# Patient Record
Sex: Female | Born: 1935 | Race: White | Hispanic: No | Smoking: Never smoker
Health system: Southern US, Community
[De-identification: ages and names within clinical notes are randomized; demographics above are authoritative.]

## PROBLEM LIST (undated history)

## (undated) DIAGNOSIS — I471 Supraventricular tachycardia, unspecified: Secondary | ICD-10-CM

## (undated) DIAGNOSIS — I10 Essential (primary) hypertension: Secondary | ICD-10-CM

## (undated) DIAGNOSIS — M549 Dorsalgia, unspecified: Secondary | ICD-10-CM

## (undated) DIAGNOSIS — I639 Cerebral infarction, unspecified: Secondary | ICD-10-CM

## (undated) DIAGNOSIS — R351 Nocturia: Secondary | ICD-10-CM

## (undated) DIAGNOSIS — I480 Paroxysmal atrial fibrillation: Secondary | ICD-10-CM

## (undated) DIAGNOSIS — M899 Disorder of bone, unspecified: Secondary | ICD-10-CM

## (undated) DIAGNOSIS — K219 Gastro-esophageal reflux disease without esophagitis: Secondary | ICD-10-CM

## (undated) DIAGNOSIS — N182 Chronic kidney disease, stage 2 (mild): Secondary | ICD-10-CM

## (undated) DIAGNOSIS — I4892 Unspecified atrial flutter: Secondary | ICD-10-CM

## (undated) DIAGNOSIS — F411 Generalized anxiety disorder: Secondary | ICD-10-CM

## (undated) DIAGNOSIS — R7302 Impaired glucose tolerance (oral): Secondary | ICD-10-CM

## (undated) DIAGNOSIS — H548 Legal blindness, as defined in USA: Secondary | ICD-10-CM

## (undated) DIAGNOSIS — R269 Unspecified abnormalities of gait and mobility: Secondary | ICD-10-CM

## (undated) DIAGNOSIS — M545 Low back pain: Secondary | ICD-10-CM

## (undated) DIAGNOSIS — G4710 Hypersomnia, unspecified: Secondary | ICD-10-CM

## (undated) DIAGNOSIS — E669 Obesity, unspecified: Secondary | ICD-10-CM

## (undated) DIAGNOSIS — E785 Hyperlipidemia, unspecified: Secondary | ICD-10-CM

## (undated) DIAGNOSIS — Z853 Personal history of malignant neoplasm of breast: Secondary | ICD-10-CM

## (undated) DIAGNOSIS — M949 Disorder of cartilage, unspecified: Secondary | ICD-10-CM

## (undated) HISTORY — DX: Unspecified abnormalities of gait and mobility: R26.9

## (undated) HISTORY — DX: Essential (primary) hypertension: I10

## (undated) HISTORY — DX: Legal blindness, as defined in USA: H54.8

## (undated) HISTORY — DX: Nocturia: R35.1

## (undated) HISTORY — DX: Disorder of bone, unspecified: M89.9

## (undated) HISTORY — DX: Low back pain: M54.5

## (undated) HISTORY — DX: Paroxysmal atrial fibrillation: I48.0

## (undated) HISTORY — DX: Unspecified atrial flutter: I48.92

## (undated) HISTORY — DX: Generalized anxiety disorder: F41.1

## (undated) HISTORY — PX: KNEE SURGERY: SHX244

## (undated) HISTORY — DX: Obesity, unspecified: E66.9

## (undated) HISTORY — PX: APPENDECTOMY: SHX54

## (undated) HISTORY — PX: CATARACT EXTRACTION: SUR2

## (undated) HISTORY — PX: DILATION AND CURETTAGE OF UTERUS: SHX78

## (undated) HISTORY — DX: Impaired glucose tolerance (oral): R73.02

## (undated) HISTORY — DX: Supraventricular tachycardia, unspecified: I47.10

## (undated) HISTORY — DX: Supraventricular tachycardia: I47.1

## (undated) HISTORY — PX: OTHER SURGICAL HISTORY: SHX169

## (undated) HISTORY — DX: Gastro-esophageal reflux disease without esophagitis: K21.9

## (undated) HISTORY — DX: Dorsalgia, unspecified: M54.9

## (undated) HISTORY — DX: Disorder of cartilage, unspecified: M94.9

## (undated) HISTORY — DX: Cerebral infarction, unspecified: I63.9

## (undated) HISTORY — DX: Hypersomnia, unspecified: G47.10

## (undated) HISTORY — DX: Chronic kidney disease, stage 2 (mild): N18.2

## (undated) HISTORY — DX: Personal history of malignant neoplasm of breast: Z85.3

## (undated) HISTORY — DX: Hyperlipidemia, unspecified: E78.5

---

## 2001-06-11 ENCOUNTER — Emergency Department (HOSPITAL_COMMUNITY): Admission: EM | Admit: 2001-06-11 | Discharge: 2001-06-11 | Payer: Self-pay | Admitting: *Deleted

## 2001-06-30 ENCOUNTER — Inpatient Hospital Stay (HOSPITAL_COMMUNITY): Admission: EM | Admit: 2001-06-30 | Discharge: 2001-07-06 | Payer: Self-pay | Admitting: Internal Medicine

## 2001-06-30 ENCOUNTER — Encounter (INDEPENDENT_AMBULATORY_CARE_PROVIDER_SITE_OTHER): Payer: Self-pay

## 2001-06-30 ENCOUNTER — Encounter: Payer: Self-pay | Admitting: Internal Medicine

## 2001-07-03 ENCOUNTER — Encounter: Payer: Self-pay | Admitting: Anesthesiology

## 2001-08-21 ENCOUNTER — Encounter (INDEPENDENT_AMBULATORY_CARE_PROVIDER_SITE_OTHER): Payer: Self-pay

## 2001-08-21 ENCOUNTER — Ambulatory Visit (HOSPITAL_COMMUNITY): Admission: RE | Admit: 2001-08-21 | Discharge: 2001-08-21 | Payer: Self-pay | Admitting: Obstetrics and Gynecology

## 2002-11-29 ENCOUNTER — Emergency Department (HOSPITAL_COMMUNITY): Admission: EM | Admit: 2002-11-29 | Discharge: 2002-11-30 | Payer: Self-pay | Admitting: Emergency Medicine

## 2003-03-30 ENCOUNTER — Emergency Department (HOSPITAL_COMMUNITY): Admission: EM | Admit: 2003-03-30 | Discharge: 2003-03-30 | Payer: Self-pay

## 2004-07-17 ENCOUNTER — Ambulatory Visit: Payer: Self-pay | Admitting: Internal Medicine

## 2004-07-31 ENCOUNTER — Encounter: Admission: RE | Admit: 2004-07-31 | Discharge: 2004-07-31 | Payer: Self-pay | Admitting: Internal Medicine

## 2004-07-31 ENCOUNTER — Encounter (INDEPENDENT_AMBULATORY_CARE_PROVIDER_SITE_OTHER): Payer: Self-pay | Admitting: Specialist

## 2004-08-03 ENCOUNTER — Encounter: Admission: RE | Admit: 2004-08-03 | Discharge: 2004-08-03 | Payer: Self-pay | Admitting: Internal Medicine

## 2004-08-08 ENCOUNTER — Ambulatory Visit: Payer: Self-pay | Admitting: Internal Medicine

## 2004-08-18 ENCOUNTER — Encounter: Admission: RE | Admit: 2004-08-18 | Discharge: 2004-08-18 | Payer: Self-pay

## 2004-08-23 ENCOUNTER — Encounter: Admission: RE | Admit: 2004-08-23 | Discharge: 2004-08-23 | Payer: Self-pay

## 2004-08-23 ENCOUNTER — Encounter (INDEPENDENT_AMBULATORY_CARE_PROVIDER_SITE_OTHER): Payer: Self-pay | Admitting: Diagnostic Radiology

## 2004-08-23 ENCOUNTER — Encounter (INDEPENDENT_AMBULATORY_CARE_PROVIDER_SITE_OTHER): Payer: Self-pay | Admitting: Specialist

## 2004-08-29 ENCOUNTER — Ambulatory Visit: Payer: Self-pay | Admitting: Oncology

## 2004-09-01 ENCOUNTER — Ambulatory Visit: Admission: RE | Admit: 2004-09-01 | Discharge: 2004-10-17 | Payer: Self-pay | Admitting: Radiation Oncology

## 2004-09-22 ENCOUNTER — Ambulatory Visit (HOSPITAL_COMMUNITY): Admission: RE | Admit: 2004-09-22 | Discharge: 2004-09-22 | Payer: Self-pay | Admitting: Oncology

## 2004-11-15 ENCOUNTER — Encounter (INDEPENDENT_AMBULATORY_CARE_PROVIDER_SITE_OTHER): Payer: Self-pay | Admitting: *Deleted

## 2004-11-15 ENCOUNTER — Inpatient Hospital Stay (HOSPITAL_COMMUNITY): Admission: RE | Admit: 2004-11-15 | Discharge: 2004-11-17 | Payer: Self-pay

## 2004-11-26 ENCOUNTER — Inpatient Hospital Stay (HOSPITAL_COMMUNITY): Admission: EM | Admit: 2004-11-26 | Discharge: 2004-12-01 | Payer: Self-pay | Admitting: Emergency Medicine

## 2005-12-18 ENCOUNTER — Ambulatory Visit: Payer: Self-pay | Admitting: Internal Medicine

## 2005-12-18 LAB — CONVERTED CEMR LAB
Basophils Absolute: 0 10*3/uL (ref 0.0–0.1)
Basophils Relative: 0.5 % (ref 0.0–1.0)
Chloride: 106 meq/L (ref 96–112)
Chol/HDL Ratio, serum: 4.4
Cholesterol: 184 mg/dL (ref 0–200)
Eosinophil percent: 3.8 % (ref 0.0–5.0)
HDL: 41.4 mg/dL (ref >39.0)
Hemoglobin: 14.9 g/dL (ref 12.0–15.0)
LDL Cholesterol: 116 mg/dL — ABNORMAL HIGH (ref 0–99)
Monocytes Relative: 6.1 % (ref 3.0–11.0)
Platelets: 288 10*3/uL (ref 150–400)
RDW: 12.1 % (ref 11.5–14.6)
TSH: 3.31 microintl units/mL (ref 0.35–5.50)
WBC: 9.4 10*3/uL (ref 4.5–10.5)

## 2006-01-09 ENCOUNTER — Ambulatory Visit: Payer: Self-pay | Admitting: Internal Medicine

## 2006-02-28 ENCOUNTER — Emergency Department (HOSPITAL_COMMUNITY): Admission: EM | Admit: 2006-02-28 | Discharge: 2006-02-28 | Payer: Self-pay | Admitting: Emergency Medicine

## 2006-03-19 ENCOUNTER — Emergency Department (HOSPITAL_COMMUNITY): Admission: EM | Admit: 2006-03-19 | Discharge: 2006-03-19 | Payer: Self-pay | Admitting: Emergency Medicine

## 2006-03-20 ENCOUNTER — Ambulatory Visit: Payer: Self-pay | Admitting: Oncology

## 2006-04-11 LAB — CBC WITH DIFFERENTIAL/PLATELET
BASO%: 0.6 % (ref 0.0–2.0)
Basophils Absolute: 0 10*3/uL (ref 0.0–0.1)
EOS%: 5.1 % (ref 0.0–7.0)
HGB: 16 g/dL — ABNORMAL HIGH (ref 11.6–15.9)
MCH: 31.7 pg (ref 26.0–34.0)
MCHC: 35.3 g/dL (ref 32.0–36.0)
RDW: 12.6 % (ref 11.3–14.5)
WBC: 8.4 10*3/uL (ref 3.9–10.0)
lymph#: 2.8 10*3/uL (ref 0.9–3.3)

## 2006-04-11 LAB — COMPREHENSIVE METABOLIC PANEL
ALT: 38 U/L — ABNORMAL HIGH (ref 0–35)
AST: 29 U/L (ref 0–37)
Albumin: 4.1 g/dL (ref 3.5–5.2)
Calcium: 9.1 mg/dL (ref 8.4–10.5)
Chloride: 99 mEq/L (ref 96–112)
Potassium: 3.3 mEq/L — ABNORMAL LOW (ref 3.5–5.3)
Total Protein: 7.6 g/dL (ref 6.0–8.3)

## 2006-04-11 LAB — CANCER ANTIGEN 27.29: CA 27.29: 58 U/mL — ABNORMAL HIGH (ref 0–39)

## 2006-04-30 ENCOUNTER — Ambulatory Visit (HOSPITAL_COMMUNITY): Admission: RE | Admit: 2006-04-30 | Discharge: 2006-04-30 | Payer: Self-pay | Admitting: Oncology

## 2006-05-13 ENCOUNTER — Ambulatory Visit: Payer: Self-pay | Admitting: Oncology

## 2006-05-16 LAB — COMPREHENSIVE METABOLIC PANEL
ALT: 38 U/L — ABNORMAL HIGH (ref 0–35)
AST: 36 U/L (ref 0–37)
Alkaline Phosphatase: 79 U/L (ref 39–117)
BUN: 17 mg/dL (ref 6–23)
Creatinine, Ser: 1.02 mg/dL (ref 0.40–1.20)
Potassium: 3.3 mEq/L — ABNORMAL LOW (ref 3.5–5.3)

## 2006-05-16 LAB — CBC WITH DIFFERENTIAL/PLATELET
BASO%: 0.6 % (ref 0.0–2.0)
Basophils Absolute: 0.1 10*3/uL (ref 0.0–0.1)
EOS%: 4.6 % (ref 0.0–7.0)
HGB: 15.7 g/dL (ref 11.6–15.9)
MCH: 31.1 pg (ref 26.0–34.0)
MCHC: 34.9 g/dL (ref 32.0–36.0)
MCV: 89.2 fL (ref 81.0–101.0)
MONO%: 6 % (ref 0.0–13.0)
NEUT%: 60.6 % (ref 39.6–76.8)
RDW: 12.5 % (ref 11.3–14.5)

## 2006-06-25 ENCOUNTER — Ambulatory Visit (HOSPITAL_COMMUNITY): Admission: RE | Admit: 2006-06-25 | Discharge: 2006-06-25 | Payer: Self-pay | Admitting: Oncology

## 2006-06-28 ENCOUNTER — Ambulatory Visit: Payer: Self-pay | Admitting: Oncology

## 2006-07-02 LAB — CBC WITH DIFFERENTIAL/PLATELET
Basophils Absolute: 0 10*3/uL (ref 0.0–0.1)
Eosinophils Absolute: 0.3 10*3/uL (ref 0.0–0.5)
HGB: 14.6 g/dL (ref 11.6–15.9)
LYMPH%: 31.2 % (ref 14.0–48.0)
MCV: 88.3 fL (ref 81.0–101.0)
MONO%: 6.3 % (ref 0.0–13.0)
NEUT#: 3.9 10*3/uL (ref 1.5–6.5)
NEUT%: 57.1 % (ref 39.6–76.8)
Platelets: 310 10*3/uL (ref 145–400)

## 2006-07-02 LAB — COMPREHENSIVE METABOLIC PANEL
ALT: 51 U/L — ABNORMAL HIGH (ref 0–35)
Albumin: 4.2 g/dL (ref 3.5–5.2)
CO2: 26 mEq/L (ref 19–32)
Calcium: 9.6 mg/dL (ref 8.4–10.5)
Chloride: 101 mEq/L (ref 96–112)
Glucose, Bld: 142 mg/dL — ABNORMAL HIGH (ref 70–99)
Potassium: 3.2 mEq/L — ABNORMAL LOW (ref 3.5–5.3)
Sodium: 141 mEq/L (ref 135–145)
Total Bilirubin: 1 mg/dL (ref 0.3–1.2)
Total Protein: 7.4 g/dL (ref 6.0–8.3)

## 2006-07-02 LAB — CANCER ANTIGEN 27.29: CA 27.29: 37 U/mL (ref 0–39)

## 2006-07-11 LAB — CBC WITH DIFFERENTIAL/PLATELET
BASO%: 0.5 % (ref 0.0–2.0)
EOS%: 5.1 % (ref 0.0–7.0)
MCH: 31.4 pg (ref 26.0–34.0)
MCHC: 36.2 g/dL — ABNORMAL HIGH (ref 32.0–36.0)
MONO#: 0.4 10*3/uL (ref 0.1–0.9)
NEUT%: 59.7 % (ref 39.6–76.8)
RBC: 4.6 10*6/uL (ref 3.70–5.32)
WBC: 7.2 10*3/uL (ref 3.9–10.0)
lymph#: 2.1 10*3/uL (ref 0.9–3.3)

## 2006-07-11 LAB — COMPREHENSIVE METABOLIC PANEL
ALT: 36 U/L — ABNORMAL HIGH (ref 0–35)
AST: 31 U/L (ref 0–37)
CO2: 26 mEq/L (ref 19–32)
Calcium: 9.3 mg/dL (ref 8.4–10.5)
Chloride: 104 mEq/L (ref 96–112)
Creatinine, Ser: 0.99 mg/dL (ref 0.40–1.20)
Sodium: 143 mEq/L (ref 135–145)
Total Bilirubin: 1.1 mg/dL (ref 0.3–1.2)
Total Protein: 7.4 g/dL (ref 6.0–8.3)

## 2006-07-11 LAB — CANCER ANTIGEN 27.29: CA 27.29: 42 U/mL — ABNORMAL HIGH (ref 0–39)

## 2006-08-06 LAB — BASIC METABOLIC PANEL
BUN: 21 mg/dL (ref 6–23)
CO2: 23 mEq/L (ref 19–32)
Calcium: 9 mg/dL (ref 8.4–10.5)
Chloride: 100 mEq/L (ref 96–112)
Creatinine, Ser: 1.05 mg/dL (ref 0.40–1.20)

## 2006-08-30 ENCOUNTER — Ambulatory Visit: Payer: Self-pay | Admitting: Oncology

## 2006-09-03 LAB — BASIC METABOLIC PANEL
BUN: 11 mg/dL (ref 6–23)
Calcium: 9.3 mg/dL (ref 8.4–10.5)
Chloride: 105 mEq/L (ref 96–112)
Creatinine, Ser: 1.02 mg/dL (ref 0.40–1.20)

## 2006-10-03 ENCOUNTER — Encounter: Payer: Self-pay | Admitting: Internal Medicine

## 2006-10-03 LAB — COMPREHENSIVE METABOLIC PANEL
Albumin: 4.2 g/dL (ref 3.5–5.2)
Alkaline Phosphatase: 60 U/L (ref 39–117)
BUN: 16 mg/dL (ref 6–23)
Calcium: 9.1 mg/dL (ref 8.4–10.5)
Chloride: 99 mEq/L (ref 96–112)
Glucose, Bld: 122 mg/dL — ABNORMAL HIGH (ref 70–99)
Potassium: 3 mEq/L — ABNORMAL LOW (ref 3.5–5.3)
Sodium: 138 mEq/L (ref 135–145)
Total Protein: 7.6 g/dL (ref 6.0–8.3)

## 2006-10-03 LAB — CBC WITH DIFFERENTIAL/PLATELET
Basophils Absolute: 0 10*3/uL (ref 0.0–0.1)
Eosinophils Absolute: 0.5 10*3/uL (ref 0.0–0.5)
HGB: 14.9 g/dL (ref 11.6–15.9)
MONO#: 0.6 10*3/uL (ref 0.1–0.9)
MONO%: 7.8 % (ref 0.0–13.0)
NEUT#: 3.6 10*3/uL (ref 1.5–6.5)
RBC: 4.94 10*6/uL (ref 3.70–5.32)
RDW: 10.5 % — ABNORMAL LOW (ref 11.3–14.5)
WBC: 7.3 10*3/uL (ref 3.9–10.0)
lymph#: 2.5 10*3/uL (ref 0.9–3.3)

## 2006-10-29 ENCOUNTER — Ambulatory Visit: Payer: Self-pay | Admitting: Oncology

## 2006-11-01 LAB — BASIC METABOLIC PANEL
BUN: 17 mg/dL (ref 6–23)
Calcium: 8.9 mg/dL (ref 8.4–10.5)
Glucose, Bld: 146 mg/dL — ABNORMAL HIGH (ref 70–99)
Sodium: 141 mEq/L (ref 135–145)

## 2006-11-24 ENCOUNTER — Emergency Department (HOSPITAL_COMMUNITY): Admission: EM | Admit: 2006-11-24 | Discharge: 2006-11-24 | Payer: Self-pay | Admitting: Emergency Medicine

## 2006-11-25 DIAGNOSIS — M545 Low back pain, unspecified: Secondary | ICD-10-CM

## 2006-11-25 DIAGNOSIS — H548 Legal blindness, as defined in USA: Secondary | ICD-10-CM

## 2006-11-25 DIAGNOSIS — Z853 Personal history of malignant neoplasm of breast: Secondary | ICD-10-CM

## 2006-11-25 HISTORY — DX: Low back pain, unspecified: M54.50

## 2006-11-25 HISTORY — DX: Legal blindness, as defined in USA: H54.8

## 2006-11-25 HISTORY — DX: Personal history of malignant neoplasm of breast: Z85.3

## 2006-12-16 ENCOUNTER — Encounter: Admission: RE | Admit: 2006-12-16 | Discharge: 2006-12-16 | Payer: Self-pay | Admitting: Oncology

## 2006-12-23 ENCOUNTER — Ambulatory Visit: Payer: Self-pay | Admitting: Oncology

## 2006-12-24 ENCOUNTER — Ambulatory Visit (HOSPITAL_COMMUNITY): Admission: RE | Admit: 2006-12-24 | Discharge: 2006-12-24 | Payer: Self-pay | Admitting: Oncology

## 2006-12-27 ENCOUNTER — Encounter: Payer: Self-pay | Admitting: Internal Medicine

## 2006-12-27 LAB — CBC WITH DIFFERENTIAL/PLATELET
BASO%: 0.6 % (ref 0.0–2.0)
Eosinophils Absolute: 0.5 10*3/uL (ref 0.0–0.5)
LYMPH%: 35.4 % (ref 14.0–48.0)
MCHC: 35.7 g/dL (ref 32.0–36.0)
MCV: 87.6 fL (ref 81.0–101.0)
MONO%: 6 % (ref 0.0–13.0)
NEUT#: 3.4 10*3/uL (ref 1.5–6.5)
RBC: 4.82 10*6/uL (ref 3.70–5.32)
RDW: 10.7 % — ABNORMAL LOW (ref 11.3–14.5)
WBC: 6.7 10*3/uL (ref 3.9–10.0)

## 2006-12-27 LAB — COMPREHENSIVE METABOLIC PANEL
ALT: 34 U/L (ref 0–35)
AST: 30 U/L (ref 0–37)
Albumin: 3.6 g/dL (ref 3.5–5.2)
Alkaline Phosphatase: 58 U/L (ref 39–117)
Glucose, Bld: 108 mg/dL — ABNORMAL HIGH (ref 70–99)
Potassium: 3.9 mEq/L (ref 3.5–5.3)
Sodium: 140 mEq/L (ref 135–145)
Total Bilirubin: 1 mg/dL (ref 0.3–1.2)
Total Protein: 7 g/dL (ref 6.0–8.3)

## 2006-12-29 ENCOUNTER — Ambulatory Visit (HOSPITAL_COMMUNITY): Admission: RE | Admit: 2006-12-29 | Discharge: 2006-12-29 | Payer: Self-pay | Admitting: Oncology

## 2007-02-10 ENCOUNTER — Ambulatory Visit: Payer: Self-pay | Admitting: Oncology

## 2007-02-10 ENCOUNTER — Encounter: Payer: Self-pay | Admitting: Internal Medicine

## 2007-02-10 LAB — CBC WITH DIFFERENTIAL/PLATELET
BASO%: 0.6 % (ref 0.0–2.0)
EOS%: 3.7 % (ref 0.0–7.0)
HCT: 41.6 % (ref 34.8–46.6)
LYMPH%: 33.8 % (ref 14.0–48.0)
MCH: 30.6 pg (ref 26.0–34.0)
MCHC: 34.8 g/dL (ref 32.0–36.0)
MCV: 87.9 fL (ref 81.0–101.0)
MONO#: 0.4 10*3/uL (ref 0.1–0.9)
MONO%: 5.9 % (ref 0.0–13.0)
NEUT%: 56.1 % (ref 39.6–76.8)
Platelets: 286 10*3/uL (ref 145–400)
RBC: 4.73 10*6/uL (ref 3.70–5.32)
WBC: 7.3 10*3/uL (ref 3.9–10.0)

## 2007-02-10 LAB — COMPREHENSIVE METABOLIC PANEL
ALT: 32 U/L (ref 0–35)
Alkaline Phosphatase: 65 U/L (ref 39–117)
CO2: 21 mEq/L (ref 19–32)
Creatinine, Ser: 1.07 mg/dL (ref 0.40–1.20)
Sodium: 141 mEq/L (ref 135–145)
Total Bilirubin: 0.7 mg/dL (ref 0.3–1.2)
Total Protein: 7.4 g/dL (ref 6.0–8.3)

## 2007-02-10 LAB — CANCER ANTIGEN 27.29: CA 27.29: 40 U/mL — ABNORMAL HIGH (ref 0–39)

## 2007-02-11 ENCOUNTER — Ambulatory Visit: Payer: Self-pay | Admitting: Internal Medicine

## 2007-02-11 DIAGNOSIS — E739 Lactose intolerance, unspecified: Secondary | ICD-10-CM

## 2007-02-11 DIAGNOSIS — M899 Disorder of bone, unspecified: Secondary | ICD-10-CM | POA: Insufficient documentation

## 2007-02-11 DIAGNOSIS — K219 Gastro-esophageal reflux disease without esophagitis: Secondary | ICD-10-CM | POA: Insufficient documentation

## 2007-02-11 DIAGNOSIS — M79609 Pain in unspecified limb: Secondary | ICD-10-CM | POA: Insufficient documentation

## 2007-02-11 DIAGNOSIS — E785 Hyperlipidemia, unspecified: Secondary | ICD-10-CM | POA: Insufficient documentation

## 2007-02-11 DIAGNOSIS — I10 Essential (primary) hypertension: Secondary | ICD-10-CM

## 2007-02-11 DIAGNOSIS — M949 Disorder of cartilage, unspecified: Secondary | ICD-10-CM

## 2007-02-11 HISTORY — DX: Gastro-esophageal reflux disease without esophagitis: K21.9

## 2007-02-11 HISTORY — DX: Disorder of bone, unspecified: M89.9

## 2007-03-31 ENCOUNTER — Ambulatory Visit: Payer: Self-pay | Admitting: Oncology

## 2007-04-01 ENCOUNTER — Ambulatory Visit: Payer: Self-pay | Admitting: Internal Medicine

## 2007-04-01 DIAGNOSIS — R1032 Left lower quadrant pain: Secondary | ICD-10-CM

## 2007-04-02 ENCOUNTER — Ambulatory Visit (HOSPITAL_COMMUNITY): Admission: RE | Admit: 2007-04-02 | Discharge: 2007-04-02 | Payer: Self-pay | Admitting: Internal Medicine

## 2007-04-02 ENCOUNTER — Encounter: Payer: Self-pay | Admitting: Internal Medicine

## 2007-04-02 LAB — CONVERTED CEMR LAB
BUN: 17 mg/dL (ref 6–23)
Creatinine, Ser: 1.1 mg/dL (ref 0.4–1.2)
Crystals: NEGATIVE
Eosinophils Absolute: 0.1 10*3/uL (ref 0.0–0.6)
GFR calc non Af Amer: 52 mL/min
Glucose, Bld: 102 mg/dL — ABNORMAL HIGH (ref 70–99)
HCT: 42.9 % (ref 36.0–46.0)
Lipase: 17 units/L (ref 11.0–59.0)
Lymphocytes Relative: 15.4 % (ref 12.0–46.0)
MCHC: 33.3 g/dL (ref 30.0–36.0)
Monocytes Absolute: 0.9 10*3/uL — ABNORMAL HIGH (ref 0.2–0.7)
Monocytes Relative: 6.1 % (ref 3.0–11.0)
Mucus, UA: NEGATIVE
Neutrophils Relative %: 77.6 % — ABNORMAL HIGH (ref 43.0–77.0)
Nitrite: NEGATIVE
Platelets: 384 10*3/uL (ref 150–400)
RBC: 4.73 M/uL (ref 3.87–5.11)
RDW: 12 % (ref 11.5–14.6)
Sodium: 139 meq/L (ref 135–145)
Specific Gravity, Urine: >=1.03 (ref 1.000–1.03)
Urine Glucose: NEGATIVE mg/dL
WBC: 14.2 10*3/uL — ABNORMAL HIGH (ref 4.5–10.5)

## 2007-04-02 LAB — CBC WITH DIFFERENTIAL/PLATELET
BASO%: 0.5 % (ref 0.0–2.0)
Basophils Absolute: 0.1 10*3/uL (ref 0.0–0.1)
Eosinophils Absolute: 0.3 10*3/uL (ref 0.0–0.5)
HCT: 39.7 % (ref 34.8–46.6)
HGB: 14 g/dL (ref 11.6–15.9)
MCHC: 35.4 g/dL (ref 32.0–36.0)
MONO#: 0.9 10*3/uL (ref 0.1–0.9)
NEUT#: 8.1 10*3/uL — ABNORMAL HIGH (ref 1.5–6.5)
NEUT%: 68 % (ref 39.6–76.8)
Platelets: 364 10*3/uL (ref 145–400)
WBC: 11.9 10*3/uL — ABNORMAL HIGH (ref 3.9–10.0)
lymph#: 2.5 10*3/uL (ref 0.9–3.3)

## 2007-04-03 LAB — COMPREHENSIVE METABOLIC PANEL
ALT: 14 U/L (ref 0–35)
CO2: 20 mEq/L (ref 19–32)
Calcium: 8.5 mg/dL (ref 8.4–10.5)
Chloride: 106 mEq/L (ref 96–112)
Creatinine, Ser: 1.01 mg/dL (ref 0.40–1.20)
Glucose, Bld: 114 mg/dL — ABNORMAL HIGH (ref 70–99)

## 2007-04-03 LAB — CANCER ANTIGEN 27.29: CA 27.29: 36 U/mL (ref 0–39)

## 2007-06-05 ENCOUNTER — Ambulatory Visit: Payer: Self-pay | Admitting: Oncology

## 2007-06-09 ENCOUNTER — Encounter: Payer: Self-pay | Admitting: Internal Medicine

## 2007-06-09 LAB — CBC WITH DIFFERENTIAL/PLATELET
Basophils Absolute: 0.1 10*3/uL (ref 0.0–0.1)
Eosinophils Absolute: 0.4 10*3/uL (ref 0.0–0.5)
MCH: 31.3 pg (ref 26.0–34.0)
MCHC: 35.2 g/dL (ref 32.0–36.0)
MONO#: 0.3 10*3/uL (ref 0.1–0.9)
MONO%: 2.8 % (ref 0.0–13.0)
NEUT%: 73.3 % (ref 39.6–76.8)
Platelets: 285 10*3/uL (ref 145–400)
RBC: 4.73 10*6/uL (ref 3.70–5.32)
WBC: 10.9 10*3/uL — ABNORMAL HIGH (ref 3.9–10.0)
lymph#: 2.1 10*3/uL (ref 0.9–3.3)

## 2007-06-09 LAB — COMPREHENSIVE METABOLIC PANEL
Albumin: 4.2 g/dL (ref 3.5–5.2)
BUN: 23 mg/dL (ref 6–23)
Calcium: 9.2 mg/dL (ref 8.4–10.5)
Chloride: 106 mEq/L (ref 96–112)
Creatinine, Ser: 1.2 mg/dL (ref 0.40–1.20)
Glucose, Bld: 147 mg/dL — ABNORMAL HIGH (ref 70–99)
Potassium: 3.8 mEq/L (ref 3.5–5.3)

## 2007-06-09 LAB — CANCER ANTIGEN 27.29: CA 27.29: 45 U/mL — ABNORMAL HIGH (ref 0–39)

## 2007-09-12 ENCOUNTER — Telehealth: Payer: Self-pay | Admitting: Internal Medicine

## 2007-09-12 ENCOUNTER — Ambulatory Visit: Payer: Self-pay | Admitting: Oncology

## 2007-12-10 ENCOUNTER — Ambulatory Visit: Payer: Self-pay | Admitting: Internal Medicine

## 2007-12-10 DIAGNOSIS — R5383 Other fatigue: Secondary | ICD-10-CM

## 2007-12-10 DIAGNOSIS — R269 Unspecified abnormalities of gait and mobility: Secondary | ICD-10-CM

## 2007-12-10 DIAGNOSIS — R079 Chest pain, unspecified: Secondary | ICD-10-CM

## 2007-12-10 DIAGNOSIS — M549 Dorsalgia, unspecified: Secondary | ICD-10-CM

## 2007-12-10 DIAGNOSIS — R5381 Other malaise: Secondary | ICD-10-CM

## 2007-12-10 HISTORY — DX: Dorsalgia, unspecified: M54.9

## 2007-12-10 HISTORY — DX: Unspecified abnormalities of gait and mobility: R26.9

## 2007-12-10 LAB — CONVERTED CEMR LAB
ALT: 41 units/L — ABNORMAL HIGH (ref 0–35)
AST: 43 units/L — ABNORMAL HIGH (ref 0–37)
Bilirubin, Direct: 0.1 mg/dL (ref 0.0–0.3)
Calcium: 9.1 mg/dL (ref 8.4–10.5)
Chloride: 104 meq/L (ref 96–112)
Eosinophils Relative: 4.4 % (ref 0.0–5.0)
Folate: 10.8 ng/mL
GFR calc non Af Amer: 58 mL/min
Glucose, Bld: 97 mg/dL (ref 70–99)
Hemoglobin: 15.3 g/dL — ABNORMAL HIGH (ref 12.0–15.0)
Ketones, ur: NEGATIVE mg/dL
Leukocytes, UA: NEGATIVE
Lymphocytes Relative: 28.6 % (ref 12.0–46.0)
Neutrophils Relative %: 61.3 % (ref 43.0–77.0)
Nitrite: NEGATIVE
Potassium: 3.5 meq/L (ref 3.5–5.1)
TSH: 3.76 microintl units/mL (ref 0.35–5.50)
Total Protein: 7.7 g/dL (ref 6.0–8.3)
Urine Glucose: NEGATIVE mg/dL
Urobilinogen, UA: 0.2 (ref 0.0–1.0)
Vitamin B-12: 282 pg/mL (ref 211–911)
WBC: 8.8 10*3/uL (ref 4.5–10.5)
pH: 5.5 (ref 5.0–8.0)

## 2007-12-17 ENCOUNTER — Ambulatory Visit: Payer: Self-pay

## 2007-12-17 ENCOUNTER — Encounter: Payer: Self-pay | Admitting: Internal Medicine

## 2007-12-18 ENCOUNTER — Telehealth: Payer: Self-pay | Admitting: Internal Medicine

## 2008-01-07 ENCOUNTER — Ambulatory Visit: Payer: Self-pay | Admitting: Internal Medicine

## 2008-01-08 ENCOUNTER — Encounter: Payer: Self-pay | Admitting: Internal Medicine

## 2008-02-18 ENCOUNTER — Ambulatory Visit: Payer: Self-pay | Admitting: Oncology

## 2008-02-20 ENCOUNTER — Encounter: Payer: Self-pay | Admitting: Internal Medicine

## 2008-02-20 LAB — COMPREHENSIVE METABOLIC PANEL
ALT: 29 U/L (ref 0–35)
CO2: 24 mEq/L (ref 19–32)
Calcium: 9.1 mg/dL (ref 8.4–10.5)
Chloride: 105 mEq/L (ref 96–112)
Sodium: 138 mEq/L (ref 135–145)
Total Protein: 7.3 g/dL (ref 6.0–8.3)

## 2008-02-20 LAB — CBC WITH DIFFERENTIAL/PLATELET
BASO%: 0.4 % (ref 0.0–2.0)
MCHC: 35 g/dL (ref 32.0–36.0)
MONO#: 0.5 10*3/uL (ref 0.1–0.9)
RBC: 4.76 10*6/uL (ref 3.70–5.32)
RDW: 12.8 % (ref 11.3–14.5)
WBC: 8.2 10*3/uL (ref 3.9–10.0)
lymph#: 2.2 10*3/uL (ref 0.9–3.3)

## 2008-06-15 ENCOUNTER — Ambulatory Visit: Payer: Self-pay | Admitting: Oncology

## 2008-06-17 ENCOUNTER — Encounter: Payer: Self-pay | Admitting: Internal Medicine

## 2008-06-17 LAB — CBC WITH DIFFERENTIAL/PLATELET
BASO%: 0.4 % (ref 0.0–2.0)
HCT: 45.6 % (ref 34.8–46.6)
MCHC: 34.2 g/dL (ref 31.5–36.0)
MONO#: 0.6 10*3/uL (ref 0.1–0.9)
RBC: 4.98 10*6/uL (ref 3.70–5.45)
RDW: 12.9 % (ref 11.2–14.5)
WBC: 9.1 10*3/uL (ref 3.9–10.3)
lymph#: 2 10*3/uL (ref 0.9–3.3)

## 2008-06-18 LAB — COMPREHENSIVE METABOLIC PANEL
ALT: 29 U/L (ref 0–35)
AST: 28 U/L (ref 0–37)
CO2: 24 mEq/L (ref 19–32)
Calcium: 8.7 mg/dL (ref 8.4–10.5)
Chloride: 104 mEq/L (ref 96–112)
Sodium: 139 mEq/L (ref 135–145)
Total Protein: 7.6 g/dL (ref 6.0–8.3)

## 2008-06-18 LAB — VITAMIN D 25 HYDROXY (VIT D DEFICIENCY, FRACTURES): Vit D, 25-Hydroxy: 19 ng/mL — ABNORMAL LOW (ref 30–89)

## 2008-06-18 LAB — CANCER ANTIGEN 27.29: CA 27.29: 40 U/mL — ABNORMAL HIGH (ref 0–39)

## 2008-06-24 ENCOUNTER — Encounter: Admission: RE | Admit: 2008-06-24 | Discharge: 2008-06-24 | Payer: Self-pay | Admitting: Oncology

## 2008-10-18 ENCOUNTER — Ambulatory Visit: Payer: Self-pay | Admitting: Oncology

## 2009-02-23 ENCOUNTER — Inpatient Hospital Stay (HOSPITAL_COMMUNITY): Admission: EM | Admit: 2009-02-23 | Discharge: 2009-02-26 | Payer: Self-pay | Admitting: Emergency Medicine

## 2009-02-23 ENCOUNTER — Ambulatory Visit: Payer: Self-pay | Admitting: Cardiology

## 2009-02-24 ENCOUNTER — Encounter: Payer: Self-pay | Admitting: Cardiology

## 2009-02-25 ENCOUNTER — Encounter (INDEPENDENT_AMBULATORY_CARE_PROVIDER_SITE_OTHER): Payer: Self-pay | Admitting: Internal Medicine

## 2009-03-01 ENCOUNTER — Ambulatory Visit: Payer: Self-pay | Admitting: Internal Medicine

## 2009-03-01 LAB — CONVERTED CEMR LAB: POC INR: 1.7

## 2009-03-07 ENCOUNTER — Ambulatory Visit: Payer: Self-pay | Admitting: Internal Medicine

## 2009-03-07 DIAGNOSIS — R05 Cough: Secondary | ICD-10-CM

## 2009-03-07 DIAGNOSIS — R059 Cough, unspecified: Secondary | ICD-10-CM | POA: Insufficient documentation

## 2009-03-07 DIAGNOSIS — I4891 Unspecified atrial fibrillation: Secondary | ICD-10-CM | POA: Insufficient documentation

## 2009-03-08 ENCOUNTER — Ambulatory Visit: Payer: Self-pay | Admitting: Cardiology

## 2009-03-08 LAB — CONVERTED CEMR LAB: POC INR: 2.6

## 2009-03-10 ENCOUNTER — Telehealth (INDEPENDENT_AMBULATORY_CARE_PROVIDER_SITE_OTHER): Payer: Self-pay | Admitting: *Deleted

## 2009-03-14 ENCOUNTER — Ambulatory Visit: Payer: Self-pay | Admitting: Internal Medicine

## 2009-03-14 ENCOUNTER — Encounter (INDEPENDENT_AMBULATORY_CARE_PROVIDER_SITE_OTHER): Payer: Self-pay | Admitting: Cardiology

## 2009-03-14 ENCOUNTER — Ambulatory Visit: Payer: Self-pay

## 2009-03-14 ENCOUNTER — Ambulatory Visit: Payer: Self-pay | Admitting: Cardiology

## 2009-03-14 ENCOUNTER — Encounter (HOSPITAL_COMMUNITY): Admission: RE | Admit: 2009-03-14 | Discharge: 2009-05-04 | Payer: Self-pay | Admitting: Internal Medicine

## 2009-03-14 LAB — CONVERTED CEMR LAB: POC INR: 2

## 2009-03-18 ENCOUNTER — Ambulatory Visit: Payer: Self-pay | Admitting: Cardiology

## 2009-03-18 DIAGNOSIS — R0602 Shortness of breath: Secondary | ICD-10-CM | POA: Insufficient documentation

## 2009-03-22 ENCOUNTER — Ambulatory Visit: Payer: Self-pay | Admitting: Cardiology

## 2009-03-22 LAB — CONVERTED CEMR LAB: POC INR: 2.2

## 2009-03-23 ENCOUNTER — Encounter: Payer: Self-pay | Admitting: Internal Medicine

## 2009-03-28 ENCOUNTER — Ambulatory Visit: Payer: Self-pay | Admitting: Internal Medicine

## 2009-03-29 ENCOUNTER — Ambulatory Visit: Payer: Self-pay | Admitting: Cardiovascular Disease

## 2009-03-29 LAB — CONVERTED CEMR LAB: POC INR: 1.8

## 2009-04-01 ENCOUNTER — Telehealth: Payer: Self-pay | Admitting: Cardiology

## 2009-04-04 ENCOUNTER — Ambulatory Visit: Payer: Self-pay | Admitting: Cardiology

## 2009-04-04 ENCOUNTER — Ambulatory Visit: Payer: Self-pay | Admitting: Internal Medicine

## 2009-04-04 LAB — CONVERTED CEMR LAB: POC INR: 2

## 2009-04-06 LAB — CONVERTED CEMR LAB
Calcium: 8.6 mg/dL (ref 8.4–10.5)
GFR calc non Af Amer: 51.62 mL/min (ref >60)
Glucose, Bld: 170 mg/dL — ABNORMAL HIGH (ref 70–99)
Potassium: 3.9 meq/L (ref 3.5–5.1)
Pro B Natriuretic peptide (BNP): 22 pg/mL (ref 0.0–100.0)
Sodium: 142 meq/L (ref 135–145)

## 2009-04-14 ENCOUNTER — Ambulatory Visit: Payer: Self-pay | Admitting: Internal Medicine

## 2009-04-14 LAB — CONVERTED CEMR LAB: POC INR: 1.6

## 2009-04-19 ENCOUNTER — Encounter: Payer: Self-pay | Admitting: Internal Medicine

## 2009-04-26 ENCOUNTER — Ambulatory Visit: Payer: Self-pay | Admitting: Internal Medicine

## 2009-05-10 ENCOUNTER — Encounter: Payer: Self-pay | Admitting: Internal Medicine

## 2009-05-10 ENCOUNTER — Ambulatory Visit: Payer: Self-pay | Admitting: Oncology

## 2009-05-10 LAB — CBC WITH DIFFERENTIAL/PLATELET
BASO%: 0.3 % (ref 0.0–2.0)
Basophils Absolute: 0 10*3/uL (ref 0.0–0.1)
EOS%: 6.3 % (ref 0.0–7.0)
HCT: 42.3 % (ref 34.8–46.6)
HGB: 14.7 g/dL (ref 11.6–15.9)
LYMPH%: 25.7 % (ref 14.0–49.7)
MCH: 32 pg (ref 25.1–34.0)
MCHC: 34.7 g/dL (ref 31.5–36.0)
MONO#: 0.4 10*3/uL (ref 0.1–0.9)
NEUT%: 62.8 % (ref 38.4–76.8)
Platelets: 275 10*3/uL (ref 145–400)

## 2009-05-10 LAB — COMPREHENSIVE METABOLIC PANEL
ALT: 27 U/L (ref 0–35)
BUN: 16 mg/dL (ref 6–23)
CO2: 19 mEq/L (ref 19–32)
Calcium: 8.8 mg/dL (ref 8.4–10.5)
Creatinine, Ser: 1.12 mg/dL (ref 0.40–1.20)
Total Bilirubin: 0.8 mg/dL (ref 0.3–1.2)

## 2009-05-10 LAB — CANCER ANTIGEN 27.29: CA 27.29: 31 U/mL (ref 0–39)

## 2009-05-11 ENCOUNTER — Encounter: Payer: Self-pay | Admitting: Internal Medicine

## 2009-05-11 ENCOUNTER — Ambulatory Visit: Payer: Self-pay | Admitting: Internal Medicine

## 2009-05-12 LAB — CONVERTED CEMR LAB
INR: 2.1 — ABNORMAL HIGH (ref 0.8–1.0)
Prothrombin Time: 21.8 s — ABNORMAL HIGH (ref 9.1–11.7)

## 2009-05-27 ENCOUNTER — Telehealth: Payer: Self-pay | Admitting: Internal Medicine

## 2009-06-21 ENCOUNTER — Ambulatory Visit: Payer: Self-pay | Admitting: Internal Medicine

## 2009-06-21 LAB — CONVERTED CEMR LAB: POC INR: 1.1

## 2009-06-29 ENCOUNTER — Encounter: Admission: RE | Admit: 2009-06-29 | Discharge: 2009-06-29 | Payer: Self-pay | Admitting: Oncology

## 2009-07-15 ENCOUNTER — Ambulatory Visit: Payer: Self-pay | Admitting: Cardiology

## 2009-07-22 ENCOUNTER — Telehealth: Payer: Self-pay | Admitting: Internal Medicine

## 2009-07-25 ENCOUNTER — Ambulatory Visit: Payer: Self-pay | Admitting: Cardiology

## 2009-08-02 ENCOUNTER — Ambulatory Visit: Payer: Self-pay | Admitting: Internal Medicine

## 2009-08-02 LAB — CONVERTED CEMR LAB: POC INR: 2.4

## 2009-09-05 ENCOUNTER — Ambulatory Visit: Payer: Self-pay | Admitting: Internal Medicine

## 2009-09-05 DIAGNOSIS — H918X9 Other specified hearing loss, unspecified ear: Secondary | ICD-10-CM

## 2009-09-05 LAB — CONVERTED CEMR LAB
Cholesterol, target level: 200 mg/dL
LDL Goal: 130 mg/dL

## 2009-10-24 ENCOUNTER — Ambulatory Visit: Payer: Self-pay | Admitting: Cardiovascular Disease

## 2009-11-07 ENCOUNTER — Ambulatory Visit: Payer: Self-pay | Admitting: Oncology

## 2009-12-21 ENCOUNTER — Ambulatory Visit: Payer: Self-pay | Admitting: Oncology

## 2009-12-26 ENCOUNTER — Ambulatory Visit (HOSPITAL_COMMUNITY)
Admission: RE | Admit: 2009-12-26 | Discharge: 2009-12-26 | Payer: Self-pay | Source: Home / Self Care | Admitting: Oncology

## 2009-12-26 ENCOUNTER — Encounter: Payer: Self-pay | Admitting: Cardiology

## 2009-12-26 ENCOUNTER — Encounter: Payer: Self-pay | Admitting: Internal Medicine

## 2009-12-26 ENCOUNTER — Encounter (INDEPENDENT_AMBULATORY_CARE_PROVIDER_SITE_OTHER): Payer: Self-pay | Admitting: Pharmacist

## 2009-12-26 LAB — COMPREHENSIVE METABOLIC PANEL
ALT: 38 U/L — ABNORMAL HIGH (ref 0–35)
AST: 32 U/L (ref 0–37)
CO2: 26 mEq/L (ref 19–32)
Calcium: 9 mg/dL (ref 8.4–10.5)
Chloride: 106 mEq/L (ref 96–112)
Potassium: 4.6 mEq/L (ref 3.5–5.3)
Sodium: 141 mEq/L (ref 135–145)
Total Protein: 7.1 g/dL (ref 6.0–8.3)

## 2009-12-26 LAB — CBC WITH DIFFERENTIAL/PLATELET
BASO%: 0.5 % (ref 0.0–2.0)
EOS%: 4 % (ref 0.0–7.0)
HCT: 43.7 % (ref 34.8–46.6)
MCH: 31.9 pg (ref 25.1–34.0)
MCHC: 34.5 g/dL (ref 31.5–36.0)
MONO#: 0.6 10*3/uL (ref 0.1–0.9)
RBC: 4.73 10*6/uL (ref 3.70–5.45)
RDW: 12.9 % (ref 11.2–14.5)
WBC: 8.8 10*3/uL (ref 3.9–10.3)
lymph#: 2 10*3/uL (ref 0.9–3.3)

## 2009-12-26 LAB — PROTHROMBIN TIME: Prothrombin Time: 23.1 seconds — ABNORMAL HIGH (ref 11.6–15.2)

## 2009-12-27 ENCOUNTER — Encounter: Payer: Self-pay | Admitting: Cardiology

## 2009-12-27 LAB — CONVERTED CEMR LAB: POC INR: 2.03

## 2009-12-28 ENCOUNTER — Encounter: Payer: Self-pay | Admitting: Cardiology

## 2009-12-30 ENCOUNTER — Ambulatory Visit (HOSPITAL_COMMUNITY): Admission: RE | Admit: 2009-12-30 | Payer: Self-pay | Admitting: Oncology

## 2010-01-02 ENCOUNTER — Telehealth: Payer: Self-pay | Admitting: Internal Medicine

## 2010-01-24 ENCOUNTER — Ambulatory Visit: Payer: Self-pay

## 2010-01-26 ENCOUNTER — Ambulatory Visit: Payer: Self-pay | Admitting: Oncology

## 2010-02-01 ENCOUNTER — Encounter: Payer: Self-pay | Admitting: Internal Medicine

## 2010-02-03 ENCOUNTER — Ambulatory Visit (HOSPITAL_COMMUNITY)
Admission: RE | Admit: 2010-02-03 | Discharge: 2010-02-03 | Payer: Self-pay | Source: Home / Self Care | Attending: Oncology | Admitting: Oncology

## 2010-02-06 ENCOUNTER — Ambulatory Visit: Admit: 2010-02-06 | Payer: Self-pay | Admitting: Internal Medicine

## 2010-02-19 ENCOUNTER — Encounter: Payer: Self-pay | Admitting: Oncology

## 2010-02-20 ENCOUNTER — Encounter (INDEPENDENT_AMBULATORY_CARE_PROVIDER_SITE_OTHER): Payer: Self-pay | Admitting: Pharmacist

## 2010-02-28 NOTE — Medication Information (Signed)
Summary: rov/ewj  Anticoagulant Therapy  Managed by: Cloyde Reams, RN, BSN Referring MD: J.John PCP: Dr. Oliver Barre Supervising MD: Ladona Ridgel MD, Sharlot Gowda Indication 1: Atrial Fibrillation Lab Used: [facility] [place] Site: [address] INR POC 2.0 INR RANGE 2-3  Dietary changes: no    Health status changes: no    Bleeding/hemorrhagic complications: no    Recent/future hospitalizations: no    Any changes in medication regimen? no    Recent/future dental: no  Any missed doses?: no       Is patient compliant with meds? yes       Allergies: No Known Drug Allergies  Anticoagulation Management History:      The patient is taking warfarin and comes in today for a routine follow up visit.  Positive risk factors for bleeding include an age of 75 years or older.  The bleeding index is 'intermediate risk'.  Positive CHADS2 values include History of HTN.  Negative CHADS2 values include Age > 37 years old.  Anticoagulation responsible provider: Ladona Ridgel MD, Sharlot Gowda.  INR POC: 2.0.  Cuvette Lot#: [id number].  Exp: 05/2010.    Anticoagulation Management Assessment/Plan:      The patient's current anticoagulation dose is Warfarin sodium 5 mg tabs: Use as directed by Anticoagulation Clinic.  The target INR is 2.0-3.0.  The next INR is due 04/14/2009.  Anticoagulation instructions were given to patient/daughter.  Results were reviewed/authorized by Cloyde Reams, RN, BSN.  She was notified by Cloyde Reams RN.         Prior Anticoagulation Instructions: INR 1.8  Take 1.5 tablets today and tomorrow then resume same dosage 1 tablet daily except 1.5 tablets on Tuesdays.  Recheck in 10 days.    Current Anticoagulation Instructions: INR 2.0  Start taking 1 tablet daily except 1.5 tablets on Thursdays and Saturdays.  Recheck in 10 days.

## 2010-02-28 NOTE — Medication Information (Signed)
Summary: rov/tm  Anticoagulant Therapy  Managed by: Shelby Dubin, PharmD, BCPS, CPP PCP: Dr. Oliver Barre Supervising MD: Shirlee Latch MD, Freida Busman Indication 1: Atrial Fibrillation Lab Used: [facility] [place] Site: [address] INR POC 2.0 INR RANGE 2-3  Dietary changes: no    Health status changes: no    Bleeding/hemorrhagic complications: no    Recent/future hospitalizations: no    Any changes in medication regimen? no    Recent/future dental: no  Any missed doses?: no       Is patient compliant with meds? yes       Allergies (verified): No Known Drug Allergies  Anticoagulation Management History:      The patient is taking warfarin and comes in today for a routine follow up visit.  Positive risk factors for bleeding include an age of 75 years or older.  The bleeding index is 'intermediate risk'.  Positive CHADS2 values include History of HTN.  Negative CHADS2 values include Age > 14 years old.  Anticoagulation responsible provider: Shirlee Latch MD, Arlen Legendre.  INR POC: 2.0.  Exp: 04/2010.    Anticoagulation Management Assessment/Plan:      The patient's current anticoagulation dose is Warfarin sodium 5 mg tabs: Use as directed by Anticoagulation Clinic.  The target INR is 2.0-3.0.  The next INR is due 03/21/2009.  Anticoagulation instructions were given to patient/daughter.  Results were reviewed/authorized by Shelby Dubin, PharmD, BCPS, CPP.  She was notified by Shelby Dubin PharmD, BCPS, CPP.         Prior Anticoagulation Instructions: INR 2.6 Change dose to 5mg s everyday.  Recheck in one week.   Current Anticoagulation Instructions: INR 2.0  Take 1.5 tabs each Tuesday and 1 tab on all other days.  Recheck in 1 week.

## 2010-02-28 NOTE — Assessment & Plan Note (Signed)
Summary: Rose Sullivan    Primary Provider:  Dr. Oliver Barre  CC:  sob.  History of Present Illness: 77 yo with obesity, deconditioning, and atrial fibrillation presents for followup.  She was admitted in 1/11 with rapid heart rate and dyspnea and found to be in atrial fib with RVR.  She converted spontaneously back to NSR.  CTA chest was negative for PE.  She had a UTI at the time which may have been the trigger for the episode.  She is stable at home.  At baseline, she has chronic dyspnea with exertion.  This has been chronic for a number of years.  She is short of breath walking about 40 feet.  No orthopnea or PND.  No chest pain.  After discharge, patient had a Lexiscan myoview that showed no ischemia or infarction.  She uses a wheelchair when she leaves the house.    ECG: NSR, normal  Labs (1/11): K 3.5, creatinine 0.98 Labs (3/11): K 3.9, creatinine 1.1, BNP 22  Current Medications (verified): 1)  Losartan Potassium 50 Mg Tabs (Losartan Potassium) .Marland Kitchen.. 1po Once Daily 2)  Tylenol/codeine #3 300-30 Mg  Tabs (Acetaminophen-Codeine) .Marland Kitchen.. 1 By Mouth Qid Prn 3)  Zometa 4 Mg/63ml  Conc (Zoledronic Acid) .... 4 Mg Q O Month-Hold 4)  Aromasin 25 Mg Tabs (Exemestane) .Marland Kitchen.. 1 Tablet Daily 5)  Diltiazem Hcl Er Beads 240 Mg Xr24h-Cap (Diltiazem Hcl Er Beads) .Marland Kitchen.. 1 Capsule Daily 6)  Warfarin Sodium 5 Mg Tabs (Warfarin Sodium) .... Use As Directed By Anticoagulation Clinic 7)  Omeprazole 20 Mg Cpdr (Omeprazole) .Marland Kitchen.. 1po Once Daily 8)  Flexeril 5 Mg Tabs (Cyclobenzaprine Hcl) .Marland Kitchen.. 1 Po Three Times A Day As Needed  Allergies: No Known Drug Allergies  Past History:  Past Medical History: 1. Breast cancer (remote) s/p R mastectomy. 2. Hypertension: ACEI cough 3. Hyperlipidemia 4. Atrial flutter s/p ablation 5. Recurrent svt/narrow complex 6. Diet-controlled diabetes. 7. GERD 8. legally blind 9. Osteopenia 10. lumbar disc disease 11. Atrial fibrillation: Echo (1/11) showed EF 60-65%, grade I  diastolic dysfunction, PASP 34 mmHg.  12. Lexiscan myoview (2/11): EF 74%, no ischemia or infarction.  13. Obesity  Family History: Reviewed history from 02/11/2007 and no changes required. Family History Hypertension - brother Family History Lung cancer - brother Family History Other cancer-Colon - father Family History of Respiratory disease - brother COPD Family History Stroke - mother  Social History: Reviewed history from 05/11/2009 and no changes required. Lives with daughter Quit smoking > 40 years ago Alcohol use-no Drug use-no  Review of Systems       All systems reivewed and negative except as per HPI.   Vital Signs:  Patient profile:   77 year old female Height:      60 inches Weight:      226 pounds BMI:     44.30 Pulse rate:   82 / minute Resp:     12 per minute BP sitting:   130 / 80  (left arm)  Vitals Entered By: Kem Parkinson (July 15, 2009 4:34 PM)  Physical Exam  General:  Well developed, well nourished, in no acute distress.  Obese.  Neck:  Neck supple, no JVD. No masses, thyromegaly or abnormal cervical nodes. Lungs:  Clear bilaterally to auscultation and percussion. Heart:  Non-displaced PMI, chest non-tender; regular rate and rhythm, S1, S2 without murmurs, rubs or gallops. Carotid upstroke normal, no bruit.  Pedals normal pulses. No edema, no varicosities. Abdomen:  Bowel sounds positive; abdomen soft  and non-tender without masses, organomegaly, or hernias noted. No hepatosplenomegaly. Extremities:  No clubbing or cyanosis. Neurologic:  Alert and oriented x 3. Psych:  Normal affect.   Impression & Recommendations:  Problem # 1:  ATRIAL FIBRILLATION (ICD-427.31) Patient is in sinus rhythm today.  She will continue diltiazem CD and warfarin anticoagulation.  She is not very active but has not had any falls.    Problem # 2:  DYSPNEA (ICD-786.05) Likely multifactorial.  Diastolic CHF may play a role but I think that most of the dyspnea is  due to obesity and deconditioning.  She is very inactive.  She has had home PT recently and knows what exercises to do at home.    Patient Instructions: 1)  Your physician wants you to follow-up in: 6 months with Dr Shirlee Latch.  You will receive a reminder letter in the mail two months in advance. If you don't receive a letter, please call our office to schedule the follow-up appointment. 2)  Do your exercises.

## 2010-02-28 NOTE — Medication Information (Signed)
Summary: rov/sp  Anticoagulant Therapy  Managed by: Eda Keys, PharmD Referring MD: J.John PCP: Dr. Oliver Barre Supervising MD: Gala Romney MD, Reuel Boom Indication 1: Atrial Fibrillation Lab Used: [facility] [place] Site: [address] INR POC 1.1 INR RANGE 2-3  Dietary changes: no    Health status changes: no    Bleeding/hemorrhagic complications: no    Recent/future hospitalizations: no    Any changes in medication regimen? yes       Details: lisinopril changed to losartan due to cough  Recent/future dental: no  Any missed doses?: yes     Details: missed one dose yesterday and 1/2 tablet on Saturday  Is patient compliant with meds? yes       Allergies: No Known Drug Allergies  Anticoagulation Management History:      The patient is taking warfarin and comes in today for a routine follow up visit.  Positive risk factors for bleeding include an age of 51 years or older.  The bleeding index is 'intermediate risk'.  Positive CHADS2 values include History of HTN.  Negative CHADS2 values include Age > 68 years old.  Her last INR was 2.1 ratio.  Anticoagulation responsible provider: Mehki Klumpp MD, Reuel Boom.  INR POC: 1.1.  Cuvette Lot#: [id number].  Exp: 08/2010.    Anticoagulation Management Assessment/Plan:      The patient's current anticoagulation dose is Warfarin sodium 5 mg tabs: Use as directed by Anticoagulation Clinic.  The target INR is 2.0-3.0.  The next INR is due 06/29/2009.  Anticoagulation instructions were given to patient/daughter.  Results were reviewed/authorized by Eda Keys, PharmD.  She was notified by Eda Keys.         Prior Anticoagulation Instructions: INR 2.3  Continue on same dosage 1 tablet daily except 1.5 tablets on Thursdays and Saturdays.  Recheck in 2 weeks.    Current Anticoagulation Instructions: INR 1.1  Take 1.5 tablets today and tomorrow.  Then resume normal dosing schedule of 1.5 tablets on Thursday and Saturday  and 1 tablet all other days.  Return to clinic in 1 week.

## 2010-02-28 NOTE — Assessment & Plan Note (Signed)
Summary: CP/ SAF  Medications Added LISINOPRIL 10 MG TABS (LISINOPRIL) one tablet daily ZOMETA 4 MG/5ML  CONC (ZOLEDRONIC ACID) 4 mg q o month-HOLD      Allergies Added: NKDA  Primary Provider:  Dr. Oliver Barre  CC:  nuclear stress test follow up.  History of Present Illness: 77 yo presents to establish cardiology followup after recent hospitalization with atrial fibrillation/rapid ventricular response.  She was admitted in 1/11 with rapid heart rate and dyspnea and found to be in atrial fib with RVR.  She converted spontaneously back to NSR.  CTA chest was negative for PE.  She had a UTI at the time which may have been the trigger for the episode.  She is stable at home.  At baseline, she has significant dyspnea with exertion.  This has been chronic for a number of years.  She is short of breath walking from one room to another in her house.  No orthopnea or PND.  No chest pain.  After discharge, patient had a Lexiscan myoview that showed no ischemia or infarction.    ECG: NSR, normal  Labs: (1/11): K 3.5, creatinine 0.98  Current Medications (verified): 1)  Lisinopril 10 Mg Tabs (Lisinopril) .... One Tablet Daily 2)  Tylenol/codeine #3 300-30 Mg  Tabs (Acetaminophen-Codeine) .Marland Kitchen.. 1 By Mouth Qid Prn 3)  Zometa 4 Mg/65ml  Conc (Zoledronic Acid) .... 4 Mg Q O Month-Hold 4)  Aromasin 25 Mg Tabs (Exemestane) .Marland Kitchen.. 1 Tablet Daily 5)  Diltiazem Hcl Er Beads 240 Mg Xr24h-Cap (Diltiazem Hcl Er Beads) .Marland Kitchen.. 1 Capsule Daily 6)  Warfarin Sodium 5 Mg Tabs (Warfarin Sodium) .... Use As Directed By Anticoagulation Clinic  Allergies (verified): No Known Drug Allergies  Past History:  Past Medical History: 1. Breast cancer (remote) s/p R mastectomy. 2. Hypertension 3. Hyperlipidemia 4. Atrial flutter s/p ablation 5. Recurrent svt/narrow complex 6. Diet-controlled diabetes. 7. GERD 8. legally blind 9. Osteopenia 10. lumbar disc disease 11. Atrial fibrillation: Echo (1/11) showed EF 60-65%,  grade I diastolic dysfunction, PASP 34 mmHg.  12. Lexiscan myoview (2/11): EF 74%, no ischemia or infarction.  13. Obesity  Family History: Reviewed history from 02/11/2007 and no changes required. Family History Hypertension - brother Family History Lung cancer - brother Family History Other cancer-Colon - father Family History of Respiratory disease - brother COPD Family History Stroke - mother  Social History: Lives with daughter Quit smoking > 40 years ago Alcohol use-no  Review of Systems       All systems reviewed and negative except as per HPI.   Vital Signs:  Patient profile:   77 year old female Height:      60 inches Weight:      225 pounds BMI:     44.10 Pulse rate:   80 / minute Pulse rhythm:   regular BP sitting:   116 / 70  (left arm) Cuff size:   large  Vitals Entered By: Judithe Modest CMA (March 18, 2009 4:47 PM)  Physical Exam  General:  Well developed, well nourished, in no acute distress.  Obese.  Head:  normocephalic and atraumatic Nose:  no deformity, discharge, inflammation, or lesions Mouth:  Teeth, gums and palate normal. Oral mucosa normal. Neck:  Neck supple, no JVD. No masses, thyromegaly or abnormal cervical nodes. Lungs:  Clear bilaterally to auscultation and percussion. Heart:  Non-displaced PMI, chest non-tender; regular rate and rhythm, S1, S2 without murmurs, rubs or gallops. Carotid upstroke normal, no bruit. Pedals normal pulses. No edema,  no varicosities. Abdomen:  Bowel sounds positive; abdomen soft and non-tender without masses, organomegaly, or hernias noted. No hepatosplenomegaly. Msk:  Back normal, normal gait. Muscle strength and tone normal. Extremities:  No clubbing or cyanosis. Neurologic:  Alert and oriented x 3. Skin:  Intact without lesions or rashes. Psych:  Normal affect.   Impression & Recommendations:  Problem # 1:  ATRIAL FIBRILLATION (ICD-427.31) Patient remains in NSR today.  Her atrial fib may have been  triggered by a UTI.  She is on coumadin and diltiazem CD.  I had a long talk with her daughter today about anticoagulation.  I initially suggested that she go onto Pradaxa instead of coumadin.  The daughter, however, is concerned that there is no reveral agent for Pradaxa and is concerned for her mother having unstoppable internal bleeding.  We had a discussion about stroke risk and bleeding risk, etc.  The patient has not had any recent falls and has no history of GI bleeding.  She will continue on coumadin for now and will stop ASA as her myoview was normal.   Problem # 2:  HYPERTENSION (ICD-401.9) Patient is taking two different calcium channel blockers, amlodipine and diltiazem CD.  I will have her stop amlodipine.  She will instead start lisinopril 10 mg daily for BP control with BMET in 2 wks.  Lisinopril has the added benefit that it appears to decrease the risk of recurrent atrial fibrillation.   Problem # 3:  DYSPNEA (ICD-786.05) Patient has chronic significant exertional dyspnea.  She does not appear volume overloaded on exam.  Diastolic CHF may play a role but I also suspect that obesity and deconditioning play a large role as well.    Patient Instructions: 1)  Your physician has recommended you make the following change in your medication:  2)  Stop Aspirin  3)  Stop Amlodipine 4)  Start Lisinopril 10mg  daily 5)  Your physician recommends that you return for lab work in: 2weeks BMP/BNP 427.31 March 4,2011 6)  Take and record your blood pressure--I will call you in 2 weeks to get the readings--Anne Lankford,RN [phone]  7)  Your physician recommends that you schedule a follow-up appointment with Dr Shirlee Latch June 17,2011 at 4:15Pm Prescriptions: LISINOPRIL 10 MG TABS (LISINOPRIL) one tablet daily  #30 x 6   Entered by:   Katina Dung, RN, BSN   Authorized by:   Marca Ancona, MD   Signed by:   Katina Dung, RN, BSN on 03/18/2009   Method used:   Electronically to        [facility]  [address] [phone]* (retail)       [address]       [place], [place]  [address]       Ph: [phone] or [phone]       Fax: [phone]   RxID:   [id number]

## 2010-02-28 NOTE — Medication Information (Signed)
Summary: Coumadin Clinic   Anticoagulant Therapy  Managed by: Weston Brass, PharmD Referring MD: J.John PCP: Dr. Oliver Barre Supervising MD: Myrtis Ser MD, Tinnie Gens Indication 1: Atrial Fibrillation Lab Used: [facility] [place] Site: [address] PT 23.1 INR POC 2.03 INR RANGE 2-3  Dietary changes: no    Health status changes: no    Bleeding/hemorrhagic complications: no    Recent/future hospitalizations: no    Any changes in medication regimen? no    Recent/future dental: no  Any missed doses?: no       Is patient compliant with meds? yes      Comments: Lab drawn at [facility]. Received on 11/30.   Allergies: No Known Drug Allergies  Anticoagulation Management History:      Her anticoagulation is being managed by telephone today.  Positive risk factors for bleeding include an age of 50 years or older.  The bleeding index is 'intermediate risk'.  Positive CHADS2 values include History of HTN.  Negative CHADS2 values include Age > 18 years old.  Her last INR was 2.1 ratio.  Prothrombin time is 23.1.  Anticoagulation responsible provider: Myrtis Ser MD, Tinnie Gens.  INR POC: 2.03.  Exp: 11/2010.    Anticoagulation Management Assessment/Plan:      The patient's current anticoagulation dose is Warfarin sodium 5 mg tabs: Use as directed by Anticoagulation Clinic.  The target INR is 2.0-3.0.  The next INR is due 01/24/2010.  Anticoagulation instructions were given to patient/daughter.  Results were reviewed/authorized by Weston Brass, PharmD.  She was notified by Weston Brass PharmD.         Prior Anticoagulation Instructions: INR 1.2  Take an extra tablet today (Monday) and take 1 1/2 tablets tomorrow (Tuesday). Then resume regular dose of 1 tablet everyday except 1 1/2 tablets on Thursdays and Saturdays. Re-check INR in 1 week.   Current Anticoagulation Instructions: INR 2.03  Spoke with pt's daughter.  Continue same dose of 1 tablet every day except 1 1/2 tablets on  Thursday and Saturday.  Per daughter's request, recheck INR in 4 weeks.

## 2010-02-28 NOTE — Letter (Signed)
Summary: Handout Printed  Printed Handout:  - Coumadin Instructions-w/out Meds 

## 2010-02-28 NOTE — Medication Information (Signed)
Summary: rs missed appt/mt      Allergies Added: NKDA Anticoagulant Therapy  Managed by: Rolland Porter, PharmD Referring MD: J.John PCP: Dr. Oliver Barre Supervising MD: Jens Som MD, Arlys John Indication 1: Atrial Fibrillation Lab Used: [facility] [place] Site: [address] INR POC 1.2 INR RANGE 2-3  Dietary changes: no    Health status changes: no    Bleeding/hemorrhagic complications: no    Recent/future hospitalizations: no    Any changes in medication regimen? no    Recent/future dental: no  Any missed doses?: yes     Details: last friday, saturday  Is patient compliant with meds? yes       Current Medications (verified): 1)  Losartan Potassium 50 Mg Tabs (Losartan Potassium) .Marland Kitchen.. 1po Once Daily 2)  Tylenol/codeine #3 300-30 Mg  Tabs (Acetaminophen-Codeine) .Marland Kitchen.. 1 By Mouth Qid Prn 3)  Zometa 4 Mg/99ml  Conc (Zoledronic Acid) .... 4 Mg Q O Month-Hold 4)  Aromasin 25 Mg Tabs (Exemestane) .Marland Kitchen.. 1 Tablet Daily 5)  Diltiazem Hcl Er Beads 240 Mg Xr24h-Cap (Diltiazem Hcl Er Beads) .Marland Kitchen.. 1 Capsule Daily 6)  Warfarin Sodium 5 Mg Tabs (Warfarin Sodium) .... Use As Directed By Anticoagulation Clinic 7)  Omeprazole 20 Mg Cpdr (Omeprazole) .Marland Kitchen.. 1po Once Daily 8)  Flexeril 5 Mg Tabs (Cyclobenzaprine Hcl) .Marland Kitchen.. 1 Po Three Times A Day As Needed  Allergies (verified): No Known Drug Allergies  Anticoagulation Management History:      The patient is taking warfarin and comes in today for a routine follow up visit.  Positive risk factors for bleeding include an age of 7 years or older.  The bleeding index is 'intermediate risk'.  Positive CHADS2 values include History of HTN.  Negative CHADS2 values include Age > 39 years old.  Her last INR was 2.1 ratio.  Anticoagulation responsible provider: Jens Som MD, Arlys John.  INR POC: 1.2.  Cuvette Lot#: [id number].  Exp: 08/2010.    Anticoagulation Management Assessment/Plan:      The patient's current anticoagulation dose is Warfarin  sodium 5 mg tabs: Use as directed by Anticoagulation Clinic.  The target INR is 2.0-3.0.  The next INR is due 08/02/2009.  Anticoagulation instructions were given to patient/daughter.  Results were reviewed/authorized by Rolland Porter, PharmD.  She was notified by Rolland Porter.         Prior Anticoagulation Instructions: INR 1.1  Take 1.5 tablets today and tomorrow.  Then resume normal dosing schedule of 1.5 tablets on Thursday and Saturday and 1 tablet all other days.  Return to clinic in 1 week.    Current Anticoagulation Instructions: INR 1.2  Pt missed two doses Friday and Saturday, took 2 tablets on Sunday.  Take 1.5 tablets today and tomorrow.  Then resume normal dosing of 1.5 tablets on Thursday and Saturday, 1 tablet Wednesday, Friday, Sunday, and Monday.  Recheck in 1 week.

## 2010-02-28 NOTE — Assessment & Plan Note (Signed)
Summary: post hosp [facility]/cd   Vital Signs:  Patient profile:   77 year old female Height:      60 inches Weight:      228 pounds BMI:     44.69 O2 Sat:      97 % on Room air Temp:     97.2 degrees F oral Pulse rate:   81 / minute BP sitting:   120 / 70  (left arm) Cuff size:   large  Vitals Entered ByZella Ball Ewing (March 07, 2009 2:12 PM)  O2 Flow:  Room air  Preventive Care Screening     declines all above today  CC: Post Hospital/RE   Primary Care Provider:  Dr. Oliver Barre  CC:  Post Hospital/RE.  History of Present Illness: here overall doing ok, just post hospn with cp and afib/rvr with now new diltiazem and coumadin;  overall doing well without further cp but needs referred stress test to complete w/u;  Pt denies CP, sob, doe, wheezing, orthopnea, pnd, worsening LE edema, palps, dizziness or syncope   Pt denies new neuro symptoms such as headache, facial or extremity weakness   Overal l good med compliacne adn tolerance.  Here with daughter who inquires about details of tx in the hosp and testing results and their meaning.   further hx indicates no fever but new onset st, non prod cough prior to onset afib. due for appt with dr magrinat/oncology; duaghter plans to call  Here for wellness Diet: Heart Healthy or DM if diabetic Physical Activities: Sedentary Depression/mood screen: Negative Hearing: decreased bilateral Visual Acuity: Grossly normal ADL's: requires assist with toileting, bathing, dressing  Fall Risk: None Home Safety: Good End-of-Life Planning: Advance directive - Full code/I agree   Problems Prior to Update: 1)  Coumadin Therapy  (ICD-V58.61) 2)  Atrial Fibrillation  (ICD-427.31) 3)  Cough  (ICD-786.2) 4)  Fatigue  (ICD-780.79) 5)  Gait Disturbance  (ICD-781.2) 6)  Chest Pain  (ICD-786.50) 7)  Arm Pain  (ICD-729.5) 8)  Back Pain  (ICD-724.5) 9)  Abdominal Pain, Left Lower Quadrant  (ICD-789.04) 10)  Osteopenia  (ICD-733.90) 11)  Gerd   (ICD-530.81) 12)  Glucose Intolerance  (ICD-271.3) 13)  Hyperlipidemia  (ICD-272.4) 14)  Hypertension  (ICD-401.9) 15)  Leg Pain, Left  (ICD-729.5) 16)  Breast Cancer, Hx of  (ICD-V10.3) 17)  Low Back Pain, Chronic  (ICD-724.2) 18)  Blindness, Legal, [place] Definition  (ICD-369.4)  Medications Prior to Update: 1)  Amlodipine Besylate 10 Mg Tabs (Amlodipine Besylate) .Marland Kitchen.. 1 Tablet By Mouth Once A Day 2)  Hydrochlorothiazide 25 Mg Tabs (Hydrochlorothiazide) .Marland Kitchen.. 1 Tablet By Mouth Once A Day 3)  Proventil Hfa 108 (90 Base) Mcg/act Aers (Albuterol Sulfate) .... Inhale 2 Puff As Directed Four Times A Day 4)  Tylenol/codeine #3 300-30 Mg  Tabs (Acetaminophen-Codeine) .Marland Kitchen.. 1 By Mouth Qid Prn 5)  Zometa 4 Mg/34ml  Conc (Zoledronic Acid) .... 4 Mg Q O Month 6)  Aromasin 25 Mg Tabs (Exemestane) .Marland Kitchen.. 1 Tablet Daily 7)  Aspirin Ec 81 Mg Tbec (Aspirin) .Marland Kitchen.. 1 Tablet Daily 8)  Diltiazem Hcl Er Beads 240 Mg Xr24h-Cap (Diltiazem Hcl Er Beads) .Marland Kitchen.. 1 Capsule Daily  Current Medications (verified): 1)  Amlodipine Besylate 10 Mg Tabs (Amlodipine Besylate) .Marland Kitchen.. 1 Tablet By Mouth Once A Day 2)  Tylenol/codeine #3 300-30 Mg  Tabs (Acetaminophen-Codeine) .Marland Kitchen.. 1 By Mouth Qid Prn 3)  Zometa 4 Mg/77ml  Conc (Zoledronic Acid) .... 4 Mg Q O Month 4)  Aromasin 25 Mg Tabs (Exemestane) .Marland Kitchen.. 1 Tablet Daily 5)  Aspirin Ec 81 Mg Tbec (Aspirin) .Marland Kitchen.. 1 Tablet Daily 6)  Diltiazem Hcl Er Beads 240 Mg Xr24h-Cap (Diltiazem Hcl Er Beads) .Marland Kitchen.. 1 Capsule Daily 7)  Hydrocodone-Homatropine 5-1.5 Mg/67ml Syrp (Hydrocodone-Homatropine) .Marland Kitchen.. 1 Tsp By Mouth Q 6 Hrs As Needed Cough  Allergies (verified): No Known Drug Allergies  Past History:  Past Surgical History: Last updated: 02/11/2007 S/P D&C- s/p mastectomy Appendectomy  Social History: Last updated: 02/11/2007 lives with daughter Never Smoked Alcohol use-no  Risk Factors: Smoking Status: never (02/11/2007)  Past Medical History: Breast cancer, hx  of Hypertension Hyperlipidemia atrial flutter s/p ablation recurrent svt/narrow complex glucose intolerance GERD legally blind Osteopenia lumbar disc disease Atrial fibrillation  Review of Systems       all otherwise negative per pt -   Physical Exam  General:  alert and overweight-appearing.   Head:  normocephalic and atraumatic.   Eyes:  vision grossly intact, pupils equal, and pupils round.   Ears:  R ear normal and L ear normal.   Nose:  no external deformity and no nasal discharge.   Mouth:  no gingival abnormalities and pharynx pink and moist.   Neck:  supple and no masses.   Lungs:  normal respiratory effort, normal breath sounds, no crackles, and no wheezes.   Heart:  normal rate and irregular rhythm.   Abdomen:  soft, non-tender, and normal bowel sounds.   Msk:  no joint tenderness and no joint swelling.   Extremities:  no edema, no erythema  Neurologic:  cranial nerves II-XII intact and strength normal in all extremities.     Impression & Recommendations:  Problem # 1:  Preventive Health Care (ICD-V70.0)  Overall doing well, age appropriate education and counseling updated and referral for appropriate preventive services done unless declined, immunizations up to date or declined, diet counseling done if overweight, urged to quit smoking if smokes , most recent labs reviewed and current ordered if appropriate, ecg reviewed or declined (interpretation per ECG scanned in the EMR if done); information regarding Medicare Prevention requirements given if appropriate   Orders: First annual wellness visit with prevention plan  ([id number])  Problem # 2:  ATRIAL FIBRILLATION (ICD-427.31)  Her updated medication list for this problem includes:    Amlodipine Besylate 10 Mg Tabs (Amlodipine besylate) .Marland Kitchen... 1 tablet by mouth once a day    Aspirin Ec 81 Mg Tbec (Aspirin) .Marland Kitchen... 1 tablet daily    Diltiazem Hcl Er Beads 240 Mg Xr24h-cap (Diltiazem hcl er beads) .Marland Kitchen... 1 capsule  daily stable overall by hx and exam, ok to continue meds/tx as is , no volume overaload and rate ok today  Problem # 3:  HYPERTENSION (ICD-401.9)  The following medications were removed from the medication list:    Hydrochlorothiazide 25 Mg Tabs (Hydrochlorothiazide) .Marland Kitchen... 1 tablet by mouth once a day Her updated medication list for this problem includes:    Amlodipine Besylate 10 Mg Tabs (Amlodipine besylate) .Marland Kitchen... 1 tablet by mouth once a day    Diltiazem Hcl Er Beads 240 Mg Xr24h-cap (Diltiazem hcl er beads) .Marland Kitchen... 1 capsule daily stable overall by hx and exam, ok to continue meds/tx as is   Problem # 4:  CHEST PAIN (ICD-786.50)  with recent hospn - for stress test, and f/u with dr Shirlee Latch  Orders: Cardiology Referral (Cardiology) Cardiolite (Cardiolite)  Problem # 5:  COUMADIN THERAPY (ICD-V58.61) to cont coumadin clinic, though I think she might be a candidate  for pradaxa - has nonvalvular PAF apparetnly  based on jan 28 echo  Problem # 6:  COUGH (ICD-786.2)  Complete Medication List: 1)  Amlodipine Besylate 10 Mg Tabs (Amlodipine besylate) .Marland Kitchen.. 1 tablet by mouth once a day 2)  Tylenol/codeine #3 300-30 Mg Tabs (Acetaminophen-codeine) .Marland Kitchen.. 1 by mouth qid prn 3)  Zometa 4 Mg/13ml Conc (Zoledronic acid) .... 4 mg q o month 4)  Aromasin 25 Mg Tabs (Exemestane) .Marland Kitchen.. 1 tablet daily 5)  Aspirin Ec 81 Mg Tbec (Aspirin) .Marland Kitchen.. 1 tablet daily 6)  Diltiazem Hcl Er Beads 240 Mg Xr24h-cap (Diltiazem hcl er beads) .Marland Kitchen.. 1 capsule daily 7)  Hydrocodone-homatropine 5-1.5 Mg/17ml Syrp (Hydrocodone-homatropine) .Marland Kitchen.. 1 tsp by mouth q 6 hrs as needed cough  Patient Instructions: 1)  Please take all new medications as prescribed - the cough med 2)  Continue all previous medications as before this visit  3)  please continue to followup with the coumadin clinic for now, and please make an appt with Dr Darnelle Catalan as you have planned 4)  You will be contacted about the referral(s) to: Dr McLean/cardiology,  and stress testing 5)  You will want to discuss the long term use of coumadin with Dr Shirlee Latch  -  it may be able to be discontinued eventually if normal rhythm comes back, or changed to Pradaxa for blood thinner instead 6)  Please schedule a follow-up appointment in 6 months or sooner if needed Prescriptions: HYDROCODONE-HOMATROPINE 5-1.5 MG/5ML SYRP (HYDROCODONE-HOMATROPINE) 1 tsp by mouth q 6 hrs as needed cough  #6 oz x 1   Entered and Authorized by:   Corwin Levins MD   Signed by:   Corwin Levins MD on 03/07/2009   Method used:   Print then Give to Patient   RxID:   [id number]

## 2010-02-28 NOTE — Consult Note (Signed)
[facility]: [facility]   Imported By: Roderic Ovens 03/02/2009 10:55:36  _____________________________________________________________________  External Attachment:    Type:   Image     Comment:   External Document

## 2010-02-28 NOTE — Miscellaneous (Signed)
Summary: PT Orders/[facility]  PT Orders/[facility]   Imported By: Sherian Rein 04/28/2009 07:55:01  _____________________________________________________________________  External Attachment:    Type:   Image     Comment:   External Document

## 2010-02-28 NOTE — Medication Information (Signed)
Summary: rov/ewj   Anticoagulant Therapy  Managed by: Weston Brass, PharmD Referring MD: J.John PCP: Dr. Oliver Barre Supervising MD: Excell Seltzer MD, Casimiro Needle Indication 1: Atrial Fibrillation Lab Used: [facility] [place] Site: [address] INR POC 1.2 INR RANGE 2-3  Dietary changes: no    Health status changes: no    Bleeding/hemorrhagic complications: no    Recent/future hospitalizations: no    Any changes in medication regimen? no    Recent/future dental: no  Any missed doses?: yes     Details: Might have missed a few doses this past week  Is patient compliant with meds? yes       Allergies: No Known Drug Allergies  Anticoagulation Management History:      The patient is taking warfarin and comes in today for a routine follow up visit.  Positive risk factors for bleeding include an age of 19 years or older.  The bleeding index is 'intermediate risk'.  Positive CHADS2 values include History of HTN.  Negative CHADS2 values include Age > 68 years old.  Her last INR was 2.1 ratio.  Anticoagulation responsible provider: Excell Seltzer MD, Casimiro Needle.  INR POC: 1.2.  Cuvette Lot#: [id number].  Exp: 11/2010.    Anticoagulation Management Assessment/Plan:      The patient's current anticoagulation dose is Warfarin sodium 5 mg tabs: Use as directed by Anticoagulation Clinic.  The target INR is 2.0-3.0.  The next INR is due 11/01/2009.  Anticoagulation instructions were given to patient/daughter.  Results were reviewed/authorized by Weston Brass, PharmD.  She was notified by Harrel Carina, PharmD candidate.         Prior Anticoagulation Instructions: INR 2.4  Continue on same dosage 1 tablet daily except 1.5 tablets on Thursdays and Saturdays.  Recheck in 2 weeks.   Current Anticoagulation Instructions: INR 1.2  Take an extra tablet today (Monday) and take 1 1/2 tablets tomorrow (Tuesday). Then resume regular dose of 1 tablet everyday except 1 1/2 tablets on Thursdays and  Saturdays. Re-check INR in 1 week.

## 2010-02-28 NOTE — Progress Notes (Signed)
Summary: B/P readings   Phone Note Outgoing Call   Call placed by: Katina Dung, RN, BSN,  April 01, 2009 7:26 AM Call placed to: Patient Summary of Call: B/P readings  Follow-up for Phone Call        call to to get B/P readings--Amlodopine D/C'd/Lisinopril 10mg  started 03-18-09/BMP&BNP  scheduled for 04-04-09. LMVM Katina Dung, RN, BSN  April 01, 2009 1:48 PM   Additional Follow-up for Phone Call Additional follow up Details #1::        RN s/w Angie (Pt's daughter) 2-23 132/70, hr 62; 2-28 140/78, hr 66;  3-2 144/80;  3-3- 132/76, hr 80;  3-9 140/80.  RN will forward to Dr Shirlee Latch for his review, made aware of lab results. Additional Follow-up by: Bernita Raisin, RN, BSN,  April 06, 2009 2:44 PM     Appended Document: B/P readings Would be reasonable to increase lisinopril to 20 mg daily as some readings still in the 140s. BMET in 2 wks after change.   Appended Document: B/P readings discussed with daughter,Angie, by telephone--pt will increase Lisinopril to 20mg  daily and return for BMP 04-27-09   Clinical Lists Changes  Medications: Changed medication from LISINOPRIL 10 MG TABS (LISINOPRIL) one tablet daily to LISINOPRIL 20 MG TABS (LISINOPRIL) one tablet daily - Signed Rx of LISINOPRIL 20 MG TABS (LISINOPRIL) one tablet daily;  #30 x 3;  Signed;  Entered by: Katina Dung, RN, BSN;  Authorized by: Marca Ancona, MD;  Method used: Electronically to [facility] [phone]*, [address], [place], [place]  [address], Ph: [phone] or [phone], Fax: [phone]    Prescriptions: LISINOPRIL 20 MG TABS (LISINOPRIL) one tablet daily  #30 x 3   Entered by:   Katina Dung, RN, BSN   Authorized by:   Marca Ancona, MD   Signed by:   Katina Dung, RN, BSN on 04/12/2009   Method used:   [facility] to        [facility] [phone]* (retail)       [address]       [place], [place]  [address]       Ph: [phone] or [phone]       Fax: [phone]   RxID:    [id number]

## 2010-02-28 NOTE — Medication Information (Signed)
Summary: rov/ewj  Anticoagulant Therapy  Managed by: Eda Keys, PharmD Referring MD: J.John PCP: Dr. Oliver Barre Supervising MD: Ladona Ridgel MD, Sharlot Gowda Indication 1: Atrial Fibrillation Lab Used: [facility] [place] Site: [address] INR POC 1.6 INR RANGE 2-3  Dietary changes: yes       Details: few servings of cole slaw, more than normal    Health status changes: no    Bleeding/hemorrhagic complications: no    Recent/future hospitalizations: no    Any changes in medication regimen? yes       Details: doubled lisinopril dose  Recent/future dental: no  Any missed doses?: no       Is patient compliant with meds? yes       Allergies: No Known Drug Allergies  Anticoagulation Management History:      The patient is taking warfarin and comes in today for a routine follow up visit.  Positive risk factors for bleeding include an age of 75 years or older.  The bleeding index is 'intermediate risk'.  Positive CHADS2 values include History of HTN.  Negative CHADS2 values include Age > 75 years old.  Anticoagulation responsible provider: Ladona Ridgel MD, Sharlot Gowda.  INR POC: 1.6.  Cuvette Lot#: [id number].  Exp: 05/2010.    Anticoagulation Management Assessment/Plan:      The patient's current anticoagulation dose is Warfarin sodium 5 mg tabs: Use as directed by Anticoagulation Clinic.  The target INR is 2.0-3.0.  The next INR is due 04/25/2009.  Anticoagulation instructions were given to patient/daughter.  Results were reviewed/authorized by Eda Keys, PharmD.  She was notified by Eda Keys.         Prior Anticoagulation Instructions: INR 2.0  Start taking 1 tablet daily except 1.5 tablets on Thursdays and Saturdays.  Recheck in 10 days.    Current Anticoagulation Instructions: INR 1.6  Take 2 tablets today.  Then return to normal dosing schedule of 1.5 tablets on Thursday and Saturday and 1 tablet all other days.  Return to clinic in 10 days.

## 2010-02-28 NOTE — Assessment & Plan Note (Signed)
Summary: 6 mos f/u / #. cd   Vital Signs:  Patient profile:   77 year old female Height:      60 inches Weight:      230.25 pounds BMI:     45.13 O2 Sat:      93 % on Room air Temp:     98 degrees F oral Pulse rate:   87 / minute BP sitting:   122 / 80  (left arm) Cuff size:   large  Vitals Entered By: Zella Ball Ewing CMA (AAMA) (September 05, 2009 1:58 PM)  O2 Flow:  Room air CC: 6 month followup/RE, Lipid Management   Primary Care Provider:  Dr. Oliver Barre  CC:  6 month followup/RE and Lipid Management.  History of Present Illness: overal doing ok;  Pt denies CP, sob, doe, wheezing, orthopnea, pnd, worsening LE edema, palps, dizziness or syncope Pt denies new neuro symptoms such as headache, facial or extremity weakness   Overall good med complaince and tolerance.  does c/o right hearing loss and not sure why, has some vague discomfort to the ears, but no fever, headache, St, cough, or sinus congestion.  No new complaints.   No change in overal back intermittent discomfort, no  bowel or bladder changes or worsening LE pain/weak/numb or falls or injury.  Denies polydipsia, polyuria.    Problems Prior to Update: 1)  Other Specified Forms of Hearing Loss  (ICD-389.8) 2)  Back Pain  (ICD-724.5) 3)  Coumadin Therapy  (ICD-V58.61) 4)  Cough  (ICD-786.2) 5)  Encounter For Long-term Use of Other Medications  (ICD-V58.69) 6)  Dyspnea  (ICD-786.05) 7)  Atrial Fibrillation  (ICD-427.31) 8)  Coumadin Therapy  (ICD-V58.61) 9)  Blindness, Legal, [place] Definition  (ICD-369.4) 10)  Chest Pain  (ICD-786.50) 11)  Cough  (ICD-786.2) 12)  Fatigue  (ICD-780.79) 13)  Gait Disturbance  (ICD-781.2) 14)  Arm Pain  (ICD-729.5) 15)  Back Pain  (ICD-724.5) 16)  Abdominal Pain, Left Lower Quadrant  (ICD-789.04) 17)  Osteopenia  (ICD-733.90) 18)  Gerd  (ICD-530.81) 19)  Glucose Intolerance  (ICD-271.3) 20)  Hyperlipidemia  (ICD-272.4) 21)  Hypertension  (ICD-401.9) 22)  Leg Pain, Left  (ICD-729.5) 23)   Breast Cancer, Hx of  (ICD-V10.3) 24)  Low Back Pain, Chronic  (ICD-724.2) 25)  Preventive Health Care  (ICD-V70.0)  Medications Prior to Update: 1)  Losartan Potassium 50 Mg Tabs (Losartan Potassium) .Marland Kitchen.. 1po Once Daily 2)  Tylenol/codeine #3 300-30 Mg  Tabs (Acetaminophen-Codeine) .Marland Kitchen.. 1 By Mouth Qid Prn 3)  Zometa 4 Mg/61ml  Conc (Zoledronic Acid) .... 4 Mg Q O Month-Hold 4)  Aromasin 25 Mg Tabs (Exemestane) .Marland Kitchen.. 1 Tablet Daily 5)  Diltiazem Hcl Er Beads 240 Mg Xr24h-Cap (Diltiazem Hcl Er Beads) .Marland Kitchen.. 1 Capsule Daily 6)  Warfarin Sodium 5 Mg Tabs (Warfarin Sodium) .... Use As Directed By Anticoagulation Clinic 7)  Omeprazole 20 Mg Cpdr (Omeprazole) .Marland Kitchen.. 1po Once Daily 8)  Flexeril 5 Mg Tabs (Cyclobenzaprine Hcl) .Marland Kitchen.. 1 Po Three Times A Day As Needed  Current Medications (verified): 1)  Losartan Potassium 50 Mg Tabs (Losartan Potassium) .Marland Kitchen.. 1po Once Daily 2)  Tylenol/codeine #3 300-30 Mg  Tabs (Acetaminophen-Codeine) .Marland Kitchen.. 1 By Mouth Qid Prn 3)  Zometa 4 Mg/8ml  Conc (Zoledronic Acid) .... 4 Mg Q O Month-Hold 4)  Aromasin 25 Mg Tabs (Exemestane) .Marland Kitchen.. 1 Tablet Daily 5)  Diltiazem Hcl Er Beads 240 Mg Xr24h-Cap (Diltiazem Hcl Er Beads) .Marland Kitchen.. 1 Capsule Daily 6)  Warfarin Sodium 5  Mg Tabs (Warfarin Sodium) .... Use As Directed By Anticoagulation Clinic 7)  Omeprazole 20 Mg Cpdr (Omeprazole) .Marland Kitchen.. 1po Once Daily 8)  Flexeril 5 Mg Tabs (Cyclobenzaprine Hcl) .Marland Kitchen.. 1 Po Three Times A Day As Needed  Allergies (verified): No Known Drug Allergies  Past History:  Past Medical History: Last updated: 07/15/2009 1. Breast cancer (remote) s/p R mastectomy. 2. Hypertension: ACEI cough 3. Hyperlipidemia 4. Atrial flutter s/p ablation 5. Recurrent svt/narrow complex 6. Diet-controlled diabetes. 7. GERD 8. legally blind 9. Osteopenia 10. lumbar disc disease 11. Atrial fibrillation: Echo (1/11) showed EF 60-65%, grade I diastolic dysfunction, PASP 34 mmHg.  12. Lexiscan myoview (2/11): EF 74%, no  ischemia or infarction.  13. Obesity  Past Surgical History: Last updated: 02/11/2007 S/P D&C- s/p mastectomy Appendectomy  Social History: Last updated: 05/11/2009 Lives with daughter Quit smoking > 40 years ago Alcohol use-no Drug use-no  Risk Factors: Smoking Status: never (02/11/2007)  Review of Systems       all otherwise negative per pt -    Physical Exam  General:  alert and overweight-appearing.   Head:  normocephalic and atraumatic.   Eyes:  vision grossly intact, pupils equal, and pupils round.   Ears:  bilat tm's ok after wax removed, hearing improved Nose:  no external deformity.   Mouth:  no gingival abnormalities and pharynx pink and moist.   Neck:  supple and no masses.   Lungs:  normal respiratory effort and normal breath sounds.   Heart:  normal rate and regular rhythm.   Msk:  no spine or paraspinal tender Extremities:  no edema, no erythema    Impression & Recommendations:  Problem # 1:  HYPERTENSION (ICD-401.9)  Her updated medication list for this problem includes:    Losartan Potassium 50 Mg Tabs (Losartan potassium) .Marland Kitchen... 1po once daily    Diltiazem Hcl Er Beads 240 Mg Xr24h-cap (Diltiazem hcl er beads) .Marland Kitchen... 1 capsule daily  BP today: 122/80 Prior BP: 130/80 (07/15/2009)  Labs Reviewed: K+: 3.9 (04/04/2009) Creat: : 1.1 (04/04/2009)   Chol: 184 (12/18/2005)   HDL: 41.4 (12/18/2005)   LDL: 116 (12/18/2005)   TG: 134 (12/18/2005) stable overall by hx and exam, ok to continue meds/tx as is   Problem # 2:  HYPERLIPIDEMIA (ICD-272.4)  Labs Reviewed: SGOT: 43 (12/10/2007)   SGPT: 41 (12/10/2007)   HDL:41.4 (12/18/2005)  LDL:116 (12/18/2005)  Chol:184 (12/18/2005)  Trig:134 (12/18/2005) d/w pt - declines further meds at this time- to cont diet, f/u next visit  Problem # 3:  LOW BACK PAIN, CHRONIC (ICD-724.2)  Her updated medication list for this problem includes:    Tylenol/codeine #3 300-30 Mg Tabs (Acetaminophen-codeine) .Marland Kitchen... 1 by  mouth qid prn    Flexeril 5 Mg Tabs (Cyclobenzaprine hcl) .Marland Kitchen... 1 po three times a day as needed stable overall by hx and exam, ok to continue meds/tx as is   Problem # 4:  GLUCOSE INTOLERANCE (ICD-271.3) asympt  - wt stable overall, ok to f/u next visit  Problem # 5:  OTHER SPECIFIED FORMS OF HEARING LOSS (ICD-389.8) improved right ear after irrigation  Complete Medication List: 1)  Losartan Potassium 50 Mg Tabs (Losartan potassium) .Marland Kitchen.. 1po once daily 2)  Tylenol/codeine #3 300-30 Mg Tabs (Acetaminophen-codeine) .Marland Kitchen.. 1 by mouth qid prn 3)  Zometa 4 Mg/97ml Conc (Zoledronic acid) .... 4 mg q o month-hold 4)  Aromasin 25 Mg Tabs (Exemestane) .Marland Kitchen.. 1 tablet daily 5)  Diltiazem Hcl Er Beads 240 Mg Xr24h-cap (Diltiazem hcl er beads) .Marland KitchenMarland KitchenMarland Kitchen  1 capsule daily 6)  Warfarin Sodium 5 Mg Tabs (Warfarin sodium) .... Use as directed by anticoagulation clinic 7)  Omeprazole 20 Mg Cpdr (Omeprazole) .Marland Kitchen.. 1po once daily 8)  Flexeril 5 Mg Tabs (Cyclobenzaprine hcl) .Marland Kitchen.. 1 po three times a day as needed  Patient Instructions: 1)  Continue all previous medications as before this visit  2)  Your right ear was cleared of wax today to help your hearing 3)  Please schedule a follow-up appointment in Jan 2011 for your "yearly medicare exam"

## 2010-02-28 NOTE — Medication Information (Signed)
Summary: rov.mp  Anticoagulant Therapy  Managed by: Cloyde Reams, RN, BSN Referring MD: J.John PCP: Dr. Oliver Barre Supervising MD: Daleen Squibb MD, Maisie Fus Indication 1: Atrial Fibrillation Lab Used: [facility] [place] Site: [address] INR POC 2.2 INR RANGE 2-3  Dietary changes: yes       Details: No vit K this week.    Health status changes: no    Bleeding/hemorrhagic complications: no    Recent/future hospitalizations: no    Any changes in medication regimen? yes       Details: Amlodipine d/c, started on Lisinopril 10mg  qd.  D/C 81mg  ASA.    Recent/future dental: no  Any missed doses?: no       Is patient compliant with meds? yes       Allergies (verified): No Known Drug Allergies  Anticoagulation Management History:      The patient is taking warfarin and comes in today for a routine follow up visit.  Positive risk factors for bleeding include an age of 20 years or older.  The bleeding index is 'intermediate risk'.  Positive CHADS2 values include History of HTN.  Negative CHADS2 values include Age > 57 years old.  Anticoagulation responsible provider: Daleen Squibb MD, Maisie Fus.  INR POC: 2.2.  Cuvette Lot#: [id number].  Exp: 04/2010.    Anticoagulation Management Assessment/Plan:      The patient's current anticoagulation dose is Warfarin sodium 5 mg tabs: Use as directed by Anticoagulation Clinic.  The target INR is 2.0-3.0.  The next INR is due 03/29/2009.  Anticoagulation instructions were given to patient/daughter.  Results were reviewed/authorized by Cloyde Reams, RN, BSN.  She was notified by Cloyde Reams RN.         Prior Anticoagulation Instructions: INR 2.0  Take 1.5 tabs each Tuesday and 1 tab on all other days.  Recheck in 1 week.   Current Anticoagulation Instructions: INR 2.2  Continue on same dosage 1 tablet daily except 1.5 tablets on Tuesdays.  Recheck in 1 week.

## 2010-02-28 NOTE — Assessment & Plan Note (Signed)
Summary: Cardiology Nuclear Study  Nuclear Med Background Indications for Stress Test: Evaluation for Ischemia, Post Hospital  Indications Comments: 02/23/09-03/09/09 Rose Sullivan) CP/A.Fib w/RVR, (-) enzymes  History: Ablation, Echo, Myocardial Perfusion Study  History Comments: 11/09 MPS: EF=69%, (-) ischemia 1/11 Echo: EF=60-65%  Symptoms: Chest Pain, DOE    Nuclear Pre-Procedure Cardiac Risk Factors: Hypertension, Lipids, Obesity Caffeine/Decaff Intake: None NPO After: 8:30 AM Lungs: clear IV 0.9% NS with Angio Cath: 22g     IV Site: (L)AC IV Started by: Burna Mortimer Deal RT-N Chest Size (in) 40     Cup Size D     Height (in): 60 Weight (lb): 228 BMI: 44.69  Nuclear Med Study 1 or 2 day study:  1 day     Stress Test Type:  [name] Reading MD:  Arvilla Meres, MD     Referring MD:  J.John Resting Radionuclide:  Technetium 32m Tetrofosmin     Resting Radionuclide Dose:  11.0 mCi  Stress Radionuclide:  Technetium 34m Tetrofosmin     Stress Radionuclide Dose:  33.0 mCi   Stress Protocol   Lexiscan: 0.4 mg   Stress Test Technologist:  Milana Na EMT-P     Nuclear Technologist:  Burna Mortimer Deal RT-N  Rest Procedure  Myocardial perfusion imaging was performed at rest 45 minutes following the intravenous administration of Myoview Technetium 67m Tetrofosmin.  Stress Procedure  The patient received IV Lexiscan 0.4 mg over 15-seconds.  Myoview injected at 30-seconds.  There were no significant changes and rare pacs with infusion.  Quantitative spect images were obtained after a 45 minute delay.  QPS Raw Data Images:  Normal; no motion artifact; normal heart/lung ratio. Stress Images:  There is normal uptake in all areas. Rest Images:  Normal homogeneous uptake in all areas of the myocardium. Subtraction (SDS):  Normal Transient Ischemic Dilatation:  .88  (Normal <1.22)  Lung/Heart Ratio:  .43  (Normal <0.45)  Quantitative Gated Spect Images QGS EDV:  62 ml QGS ESV:  16  ml QGS EF:  74 % QGS cine images:  Normal  Findings Normal nuclear study      Overall Impression  Exercise Capacity: Lexiscan study with no exercise. ECG Impression: Baseline: NSR; No significant ST segment change with Lexiscan. Overall Impression: Normal stress nuclear study.  Appended Document: Cardiology Nuclear Study LMOPT - labs negative, normal, or stable  - neg stress test

## 2010-02-28 NOTE — Miscellaneous (Signed)
Summary: Orders Update  Clinical Lists Changes  Orders: Added new Referral order of Misc. Referral (Misc. Ref) - Signed 

## 2010-02-28 NOTE — Miscellaneous (Signed)
Summary: Plan of Care & Treatment/[facility]  Plan of Care & Treatment/[facility]   Imported By: Sherian Rein 05/02/2009 07:17:13  _____________________________________________________________________  External Attachment:    Type:   Image     Comment:   External Document

## 2010-02-28 NOTE — Letter (Signed)
Summary: [facility]  [facility]   Imported By: Sherian Rein 01/02/2010 09:01:06  _____________________________________________________________________  External Attachment:    Type:   Image     Comment:   External Document

## 2010-02-28 NOTE — Assessment & Plan Note (Signed)
Summary: COUGH/NWS #   Vital Signs:  Patient profile:   77 year old female Height:      60 inches Weight:      226.25 pounds BMI:     44.35 O2 Sat:      97 % on Room air Temp:     97.9 degrees F oral Pulse rate:   84 / minute BP sitting:   100 / 60  (left arm) Cuff size:   large  Vitals Entered ByZella Ball Ewing (May 11, 2009 12:01 PM)  O2 Flow:  Room air CC: Cough, no energy/RE   Primary Care Provider:  Dr. Oliver Barre  CC:  Cough and no energy/RE.  History of Present Illness: saw dr magrinat/onc yesterday - suggested pt exam might be c/w bronchitis, and per daughter directed here;  daughter not sure of tests but did have blood work yesterday, not sure about cxr or other testing;  on further questioning, daugter states cough is minimal to nonprod, not assoc with fever, sT, apparent SOB or CP;  seemed in fact to start about the time of the new ACE in feb 2011;  also no obvious allergy, wheezing symtpoms.  recent trial cough med did not help.  Has also mild reflux on occasion but no dysphagia, wt loss, abd pain , bowel change or blood.    unfortunately missed her Coumadin check today as I was running late to her exam and will need her INR done by me today.    Daughter also relates she seems to have "no energy", was able to ambulate well enough to come in from the parking lot today, Pt denies CP, sob, doe, wheezing, orthopnea, pnd, worsening LE edema, palps, dizziness or syncope..  Daugter states she thinks she has right lower back pain recurrent for the past month with some radiation to the post right thigh, without obvious weakness  or numbness, and requests trial of muscle relaxer to see if will benefit.  No recent falls or injury, fever, night sweats, unsual wt loss.  Daughter she heard pt to be pronounced without recurrent disease.  CT angio feb 2011 without pulm mets I note on the hosp echart.    Preventive Screening-Counseling & Management      Drug Use:  no.    Problems Prior  to Update: 1)  Back Pain  (ICD-724.5) 2)  Coumadin Therapy  (ICD-V58.61) 3)  Cough  (ICD-786.2) 4)  Encounter For Long-term Use of Other Medications  (ICD-V58.69) 5)  Dyspnea  (ICD-786.05) 6)  Atrial Fibrillation  (ICD-427.31) 7)  Coumadin Therapy  (ICD-V58.61) 8)  Blindness, Legal, [place] Definition  (ICD-369.4) 9)  Chest Pain  (ICD-786.50) 10)  Cough  (ICD-786.2) 11)  Fatigue  (ICD-780.79) 12)  Gait Disturbance  (ICD-781.2) 13)  Arm Pain  (ICD-729.5) 14)  Back Pain  (ICD-724.5) 15)  Abdominal Pain, Left Lower Quadrant  (ICD-789.04) 16)  Osteopenia  (ICD-733.90) 17)  Gerd  (ICD-530.81) 18)  Glucose Intolerance  (ICD-271.3) 19)  Hyperlipidemia  (ICD-272.4) 20)  Hypertension  (ICD-401.9) 21)  Leg Pain, Left  (ICD-729.5) 22)  Breast Cancer, Hx of  (ICD-V10.3) 23)  Low Back Pain, Chronic  (ICD-724.2) 24)  Preventive Health Care  (ICD-V70.0)  Medications Prior to Update: 1)  Lisinopril 20 Mg Tabs (Lisinopril) .... One Tablet Daily 2)  Tylenol/codeine #3 300-30 Mg  Tabs (Acetaminophen-Codeine) .Marland Kitchen.. 1 By Mouth Qid Prn 3)  Zometa 4 Mg/81ml  Conc (Zoledronic Acid) .... 4 Mg Q O Month-Hold 4)  Aromasin 25  Mg Tabs (Exemestane) .Marland Kitchen.. 1 Tablet Daily 5)  Diltiazem Hcl Er Beads 240 Mg Xr24h-Cap (Diltiazem Hcl Er Beads) .Marland Kitchen.. 1 Capsule Daily 6)  Warfarin Sodium 5 Mg Tabs (Warfarin Sodium) .... Use As Directed By Anticoagulation Clinic  Current Medications (verified): 1)  Losartan Potassium 50 Mg Tabs (Losartan Potassium) .Marland Kitchen.. 1po Once Daily 2)  Tylenol/codeine #3 300-30 Mg  Tabs (Acetaminophen-Codeine) .Marland Kitchen.. 1 By Mouth Qid Prn 3)  Zometa 4 Mg/82ml  Conc (Zoledronic Acid) .... 4 Mg Q O Month-Hold 4)  Aromasin 25 Mg Tabs (Exemestane) .Marland Kitchen.. 1 Tablet Daily 5)  Diltiazem Hcl Er Beads 240 Mg Xr24h-Cap (Diltiazem Hcl Er Beads) .Marland Kitchen.. 1 Capsule Daily 6)  Warfarin Sodium 5 Mg Tabs (Warfarin Sodium) .... Use As Directed By Anticoagulation Clinic 7)  Omeprazole 20 Mg Cpdr (Omeprazole) .Marland Kitchen.. 1po Once Daily 8)   Flexeril 5 Mg Tabs (Cyclobenzaprine Hcl) .Marland Kitchen.. 1 Po Three Times A Day As Needed  Allergies (verified): No Known Drug Allergies  Past History:  Past Medical History: Last updated: 03/18/2009 1. Breast cancer (remote) s/p R mastectomy. 2. Hypertension 3. Hyperlipidemia 4. Atrial flutter s/p ablation 5. Recurrent svt/narrow complex 6. Diet-controlled diabetes. 7. GERD 8. legally blind 9. Osteopenia 10. lumbar disc disease 11. Atrial fibrillation: Echo (1/11) showed EF 60-65%, grade I diastolic dysfunction, PASP 34 mmHg.  12. Lexiscan myoview (2/11): EF 74%, no ischemia or infarction.  13. Obesity  Past Surgical History: Last updated: 02/11/2007 S/P D&C- s/p mastectomy Appendectomy  Social History: Last updated: 05/11/2009 Lives with daughter Quit smoking > 40 years ago Alcohol use-no Drug use-no  Risk Factors: Smoking Status: never (02/11/2007)  Social History: Lives with daughter Quit smoking > 40 years ago Alcohol use-no Drug use-no Drug Use:  no  Review of Systems       all otherwise negative per pt -    Physical Exam  General:  alert and overweight-appearing., in NAD Head:  normocephalic and atraumatic.   Eyes:  vision grossly intact, pupils equal, and pupils round.   Ears:  R ear normal and L ear normal.   Nose:  no external deformity and no nasal discharge.   Mouth:  no gingival abnormalities and pharynx pink and moist.   Neck:  supple and no masses.   Lungs:  normal respiratory effort.  , with no rales or wheezing Heart:  normal rate and irregular rhythm.   Abdomen:  soft, non-tender, and normal bowel sounds.   Msk:  no joint tenderness and no joint swelling.  , spine nontender, mild tender right lower lumbar paravertebral noted Extremities:  no edema, no erythema  Neurologic:  general weakness, motor gross normal to LE's Skin:  color normal and no rashes.     Impression & Recommendations:  Problem # 1:  COUGH (ICD-786.2)  check cxr per  daughter request, d/c the ace which I thnkis likely the cause  Orders: T-2 View CXR, Same Day (71020.5TC)  Problem # 2:  HYPERTENSION (ICD-401.9) Assessment: Unchanged  Her updated medication list for this problem includes:    Losartan Potassium 50 Mg Tabs (Losartan potassium) .Marland Kitchen... 1po once daily    Diltiazem Hcl Er Beads 240 Mg Xr24h-cap (Diltiazem hcl er beads) .Marland Kitchen... 1 capsule daily  change the ace to arb as above, f/u BP at hoome and next visit  BP today: 100/60 Prior BP: 116/70 (03/18/2009)  Labs Reviewed: K+: 3.9 (04/04/2009) Creat: : 1.1 (04/04/2009)   Chol: 184 (12/18/2005)   HDL: 41.4 (12/18/2005)   LDL: 116 (12/18/2005)  TG: 134 (12/18/2005)  Problem # 3:  GERD (ICD-530.81)  Her updated medication list for this problem includes:    Omeprazole 20 Mg Cpdr (Omeprazole) .Marland Kitchen... 1po once daily treat as above, f/u any worsening signs or symptoms   Problem # 4:  ATRIAL FIBRILLATION (ICD-427.31)  Her updated medication list for this problem includes:    Diltiazem Hcl Er Beads 240 Mg Xr24h-cap (Diltiazem hcl er beads) .Marland Kitchen... 1 capsule daily    Warfarin Sodium 5 Mg Tabs (Warfarin sodium) ..... Use as directed by anticoagulation clinic stable overall by hx and exam, ok to continue meds/tx as is - to check INR as she missed her coumadin check appt today  Problem # 5:  BACK PAIN (ICD-724.5)  Her updated medication list for this problem includes:    Tylenol/codeine #3 300-30 Mg Tabs (Acetaminophen-codeine) .Marland Kitchen... 1 by mouth qid prn    Flexeril 5 Mg Tabs (Cyclobenzaprine hcl) .Marland Kitchen... 1 po three times a day as needed chronic recurrent to right lower back and rigth thigh - add flexeril as needed   Complete Medication List: 1)  Losartan Potassium 50 Mg Tabs (Losartan potassium) .Marland Kitchen.. 1po once daily 2)  Tylenol/codeine #3 300-30 Mg Tabs (Acetaminophen-codeine) .Marland Kitchen.. 1 by mouth qid prn 3)  Zometa 4 Mg/37ml Conc (Zoledronic acid) .... 4 mg q o month-hold 4)  Aromasin 25 Mg Tabs (Exemestane)  .Marland Kitchen.. 1 tablet daily 5)  Diltiazem Hcl Er Beads 240 Mg Xr24h-cap (Diltiazem hcl er beads) .Marland Kitchen.. 1 capsule daily 6)  Warfarin Sodium 5 Mg Tabs (Warfarin sodium) .... Use as directed by anticoagulation clinic 7)  Omeprazole 20 Mg Cpdr (Omeprazole) .Marland Kitchen.. 1po once daily 8)  Flexeril 5 Mg Tabs (Cyclobenzaprine hcl) .Marland Kitchen.. 1 po three times a day as needed  Other Orders: TLB-PT (Protime) (85610-PTP)  Patient Instructions: 1)  Please take all new medications as prescribed  - the losartan, and the omeprazole  (sent to your pharmacy) 2)  stop the lisinopril 3)  Continue all previous medications as before this visit  4)  Please go to Radiology in the basement level for your X-Ray today  5)  Please go to the Lab in the basement for your blood  tests today  Prescriptions: FLEXERIL 5 MG TABS (CYCLOBENZAPRINE HCL) 1 po three times a day as needed  #50 x 1   Entered and Authorized by:   Corwin Levins MD   Signed by:   Corwin Levins MD on 05/11/2009   Method used:   [facility] to        [facility] [phone]* (retail)       [address]       [place], [place]  [address]       Ph: [phone] or [phone]       Fax: [phone]   RxID:   [id number] OMEPRAZOLE 20 MG CPDR (OMEPRAZOLE) 1po once daily  #30 x 11   Entered and Authorized by:   Corwin Levins MD   Signed by:   Corwin Levins MD on 05/11/2009   Method used:   [facility]ronically to        [facility] [phone]* (retail)       [address] [place]  [address]       Ph: [phone] or [phone]       Fax: [phone]   RxID:   [id number] LOSARTAN POTASSIUM 50 MG TABS (LOSARTAN POTASSIUM) 1po once daily  #  30 x 11   Entered and Authorized by:   Corwin Levins MD   Signed by:   Corwin Levins MD on 05/11/2009   Method used:   [facility] to        [facility] [phone]* (retail)       [address]       [place], [place]  [address]       Ph: [phone] or [phone]       Fax: [phone]   RxID:    [id number]

## 2010-02-28 NOTE — Medication Information (Signed)
Summary: rov/eac  Anticoagulant Therapy  Managed by: Cloyde Reams, RN, BSN Referring MD: J.John PCP: Dr. Oliver Barre Supervising MD: Gala Romney MD, Reuel Boom Indication 1: Atrial Fibrillation Lab Used: [facility] [place] Site: [address] INR POC 2.3 INR RANGE 2-3  Dietary changes: no    Health status changes: no    Bleeding/hemorrhagic complications: no    Recent/future hospitalizations: no    Any changes in medication regimen? no    Recent/future dental: no  Any missed doses?: no       Is patient compliant with meds? yes       Allergies (verified): No Known Drug Allergies  Anticoagulation Management History:      The patient is taking warfarin and comes in today for a routine follow up visit.  Positive risk factors for bleeding include an age of 75 years or older.  The bleeding index is 'intermediate risk'.  Positive CHADS2 values include History of HTN.  Negative CHADS2 values include Age > 81 years old.  Anticoagulation responsible provider: Bensimhon MD, Reuel Boom.  INR POC: 2.3.  Cuvette Lot#: [id number].  Exp: 05/2010.    Anticoagulation Management Assessment/Plan:      The patient's current anticoagulation dose is Warfarin sodium 5 mg tabs: Use as directed by Anticoagulation Clinic.  The target INR is 2.0-3.0.  The next INR is due 05/10/2009.  Anticoagulation instructions were given to patient/daughter.  Results were reviewed/authorized by Cloyde Reams, RN, BSN.  She was notified by Cloyde Reams RN.         Prior Anticoagulation Instructions: INR 1.6  Take 2 tablets today.  Then return to normal dosing schedule of 1.5 tablets on Thursday and Saturday and 1 tablet all other days.  Return to clinic in 10 days.    Current Anticoagulation Instructions: INR 2.3  Continue on same dosage 1 tablet daily except 1.5 tablets on Thursdays and Saturdays.  Recheck in 2 weeks.

## 2010-02-28 NOTE — Progress Notes (Signed)
Summary: PT re-cert?  Phone Note Call from Patient Call back at Home Phone [phone]   Caller: Daughter Summary of Call: Pt's daughter called requesting re-certification for PT with [facility]. Pt had 6-8 weeks left but had to stop due to illness but will need order from MD to re-start PT. Initial call taken by: Margaret Pyle, CMA,  July 22, 2009 2:09 PM  Follow-up for Phone Call        ok to re-start Follow-up by: Corwin Levins MD,  July 22, 2009 2:12 PM  Additional Follow-up for Phone Call Additional follow up Details #1::        Attempted to contact [facility]. 2 numbers listed [phone], [phone]. Unable to reach any one at either number. left message on machine for pt to return my call to provide correct #.   Additional Follow-up by: Margaret Pyle, CMA,  July 22, 2009 3:40 PM    Additional Follow-up for Phone Call Additional follow up Details #2::    [phone] fax [phone]. Angie pt's daughter will call today when pt goes to her appt (3p) to get verbal okay for pt's PT. Margaret Pyle, CMA  July 26, 2009 8:54 AM   Pt's daughter did not call back.  Closing phone note until further contact. Follow-up by: Margaret Pyle, CMA,  July 27, 2009 10:02 AM

## 2010-02-28 NOTE — Medication Information (Signed)
Summary: ccn/ gd  Anticoagulant Therapy  Managed by: Eda Keys, PharmD PCP: Dr. Oliver Barre Supervising MD: Graciela Husbands MD, Viviann Spare Indication 1: Atrial Fibrillation [place] Site: [address] INR POC 1.7 INR RANGE 2-3          Comments: Pt new to coumadin.  Recent diagnosis of A.fib.  Patient started on anticoagulation for CHADS score of 2.  Patient recieved coumadin 7.5 mg daily in hospital for 2 days.  INR was not yet therapeutic in hospital.  Patient presents to coumadin clinic for first time today for managemment of INR and coumadin teaching.  I have provided patient counseling for coumadin including diet, bleeding risk, etc. After discharge patient has been taking warfarin 5 mg daily.  Current Medications (verified): 1)  Amlodipine Besylate 10 Mg Tabs (Amlodipine Besylate) .Marland Kitchen.. 1 Tablet By Mouth Once A Day 2)  Hydrochlorothiazide 25 Mg Tabs (Hydrochlorothiazide) .Marland Kitchen.. 1 Tablet By Mouth Once A Day 3)  Proventil Hfa 108 (90 Base) Mcg/act Aers (Albuterol Sulfate) .... Inhale 2 Puff As Directed Four Times A Day 4)  Tylenol/codeine #3 300-30 Mg  Tabs (Acetaminophen-Codeine) .Marland Kitchen.. 1 By Mouth Qid Prn 5)  Zometa 4 Mg/66ml  Conc (Zoledronic Acid) .... 4 Mg Q O Month 6)  Aromasin 25 Mg Tabs (Exemestane) .Marland Kitchen.. 1 Tablet Daily 7)  Aspirin Ec 81 Mg Tbec (Aspirin) .Marland Kitchen.. 1 Tablet Daily 8)  Diltiazem Hcl Er Beads 240 Mg Xr24h-Cap (Diltiazem Hcl Er Beads) .Marland Kitchen.. 1 Capsule Daily  Allergies (verified): No Known Drug Allergies  Anticoagulation Management History:      The patient comes in today for her initial visit for anticoagulation therapy.  Positive risk factors for bleeding include an age of 75 years or older.  The bleeding index is 'intermediate risk'.  Positive CHADS2 values include History of HTN.  Negative CHADS2 values include Age > 27 years old.  Anticoagulation responsible provider: Graciela Husbands MD, Viviann Spare.  INR POC: 1.7.  Cuvette Lot#: [id number].  Exp: 04/2010.    Anticoagulation Management  Assessment/Plan:      The target INR is 2.0-3.0.  The next INR is due 03/08/2009.  Results were reviewed/authorized by Eda Keys, PharmD.  She was notified by Eda Keys.         Current Anticoagulation Instructions: INR 1.7  Take 1 and 1/2 tablets today.  Then start normal schedule of 1 and 1/2 tablets on Tuesday and 1 tablet all other days.   Return to clinic in 1 week.

## 2010-02-28 NOTE — Progress Notes (Signed)
Summary: medication refill  Phone Note Refill Request Message from:  Fax from Pharmacy on January 02, 2010 3:15 PM  Refills Requested: Medication #1:  TYLENOL/CODEINE #3 300-30 MG  TABS 1 by mouth qid prn   Dosage confirmed as above?Dosage Confirmed   Last Refilled: 02/11/2007   Notes: [facility] [phone] Initial call taken by: Zella Ball Ewing CMA Duncan Dull),  January 02, 2010 3:15 PM  Follow-up for Phone Call        I reviewed most recent oncology note regarding need for pain control - LBP  done hardcopy to LIM side B - dahlia  Follow-up by: Corwin Levins MD,  January 02, 2010 3:25 PM  Additional Follow-up for Phone Call Additional follow up Details #1::        Rx faxed to pharmacy Additional Follow-up by: Margaret Pyle, CMA,  January 02, 2010 3:33 PM    New/Updated Medications: TYLENOL/CODEINE #3 300-30 MG  TABS (ACETAMINOPHEN-CODEINE) 1 by mouth qid as needed Prescriptions: TYLENOL/CODEINE #3 300-30 MG  TABS (ACETAMINOPHEN-CODEINE) 1 by mouth qid as needed  #120 x 0   Entered and Authorized by:   Corwin Levins MD   Signed by:   Corwin Levins MD on 01/02/2010   Method used:   Print then Give to Patient   RxID:   [id number]

## 2010-02-28 NOTE — Letter (Signed)
Summary: CMN for walker/[facility]  CMN for walker/[facility]   Imported By: Sherian Rein 05/23/2009 09:57:12  _____________________________________________________________________  External Attachment:    Type:   Image     Comment:   External Document

## 2010-02-28 NOTE — Progress Notes (Signed)
Summary: Nuclear Pre-Procedure  Phone Note Outgoing Call Call back at Home Phone [phone]   Call placed by: Stanton Kidney, EMT-P,  March 10, 2009 2:16 PM Action Taken: Phone Call Completed Summary of Call: Reviewed information on Myoview Information Sheet (see scanned document for further details).  Spoke with Patient's daughter, Rose Sullivan.    Nuclear Med Background Indications for Stress Test: Evaluation for Ischemia, Post Hospital  Indications Comments: 02/23/09-03/09/09 [facility]) CP/A.Fib w/RVR, (-) enzymes  History: Ablation, Echo, Myocardial Perfusion Study  History Comments: 11/09 MPS: EF=69%, (-) ischemia 1/11 Echo: EF=60-65%  Symptoms: Chest Pain, DOE    Nuclear Pre-Procedure Cardiac Risk Factors: Hypertension, Lipids, Obesity Height (in): 60

## 2010-02-28 NOTE — Medication Information (Signed)
Summary: rov/eac  Anticoagulant Therapy  Managed by: Bethena Midget, RN, BSN PCP: Dr. Oliver Barre Supervising MD: Riley Kill MD, Maisie Fus Indication 1: Atrial Fibrillation Lab Used: [facility] [place] Site: [address] INR POC 2.6 INR RANGE 2-3  Dietary changes: no    Health status changes: no    Bleeding/hemorrhagic complications: no    Recent/future hospitalizations: no    Any changes in medication regimen? yes       Details: Rx cough med started yesterday PRN   Recent/future dental: no  Any missed doses?: no       Is patient compliant with meds? yes       Allergies: No Known Drug Allergies  Anticoagulation Management History:      The patient is taking warfarin and comes in today for a routine follow up visit.  Positive risk factors for bleeding include an age of 75 years or older.  The bleeding index is 'intermediate risk'.  Positive CHADS2 values include History of HTN.  Negative CHADS2 values include Age > 49 years old.  Anticoagulation responsible provider: Riley Kill MD, Maisie Fus.  INR POC: 2.6.  Cuvette Lot#: [id number].  Exp: 04/2010.    Anticoagulation Management Assessment/Plan:      The patient's current anticoagulation dose is Warfarin sodium 5 mg tabs: Use as directed by Anticoagulation Clinic.  The target INR is 2.0-3.0.  The next INR is due 03/14/2009.  Anticoagulation instructions were given to patient/daughter.  Results were reviewed/authorized by Bethena Midget, RN, BSN.  She was notified by Bethena Midget, RN, BSN.         Prior Anticoagulation Instructions: INR 1.7  Take 1 and 1/2 tablets today.  Then start normal schedule of 1 and 1/2 tablets on Tuesday and 1 tablet all other days.   Return to clinic in 1 week.  Current Anticoagulation Instructions: INR 2.6 Change dose to 5mg s everyday.  Recheck in one week.  Prescriptions: WARFARIN SODIUM 5 MG TABS (WARFARIN SODIUM) Use as directed by Anticoagulation Clinic  #40 x 3   Entered by:   Bethena Midget,  RN, BSN   Authorized by:   Marca Ancona, MD   Signed by:   Bethena Midget, RN, BSN on 03/08/2009   Method used:   [facility]        [facility] [phone]* (retail)       [address]       [place], [place]  [address]       Ph: [phone] or [phone]       Fax: [phone]   RxID:   [id number]

## 2010-02-28 NOTE — Progress Notes (Signed)
Summary: Rx refill  Phone Note Call from Patient Call back at Home Phone [phone]   Caller: [phone]  - Angie Call For: Dr Jonny Ruiz Summary of Call: Pt is out of blood pressure medicine. Pt has been out of bp meds for a week, Pt states she has left messages and that pharmacy [facility] [address]  Initial call taken by: Verdell Face,  May 27, 2009 2:50 PM  Follow-up for Phone Call        Pt's daughter states that pt was Rx'd Diltiazem while in hospital 03/2009. Pt was not given refills. pt is requesting refill sof Diltiazem to pharmacy on file. Okay for pts refill? Follow-up by: Margaret Pyle, CMA,  May 27, 2009 3:04 PM  Additional Follow-up for Phone Call Additional follow up Details #1::        ok - to robin to handle Additional Follow-up by: Corwin Levins MD,  May 27, 2009 4:36 PM    Prescriptions: DILTIAZEM HCL ER BEADS 240 MG XR24H-CAP (DILTIAZEM HCL ER BEADS) 1 capsule daily  #30 x 11   Entered by:   Scharlene Gloss   Authorized by:   Corwin Levins MD   Signed by:   Scharlene Gloss on 05/27/2009   Method used:   [facility] ...       [facility] [place] [phone][facility] (retail)       [phone]       [place], [place]  [address]       Ph: [phone] or [phone]       Fax: [phone]   RxID:   [id number]

## 2010-02-28 NOTE — Medication Information (Signed)
Summary: rov/ewj  Anticoagulant Therapy  Managed by: Cloyde Reams, RN, BSN Referring MD: J.John PCP: Dr. Oliver Barre Supervising MD: Clifton James MD, Cristal Deer Indication 1: Atrial Fibrillation Lab Used: [facility] [place] Site: [address] INR POC 1.8 INR RANGE 2-3  Dietary changes: no    Health status changes: no    Bleeding/hemorrhagic complications: no    Recent/future hospitalizations: no    Any changes in medication regimen? no    Recent/future dental: no  Any missed doses?: yes     Details: May have missed 1/2 tablet on Tuesday last week.    Is patient compliant with meds? yes       Allergies (verified): No Known Drug Allergies  Anticoagulation Management History:      The patient is taking warfarin and comes in today for a routine follow up visit.  Positive risk factors for bleeding include an age of 27 years or older.  The bleeding index is 'intermediate risk'.  Positive CHADS2 values include History of HTN.  Negative CHADS2 values include Age > 101 years old.  Anticoagulation responsible provider: Clifton James MD, Cristal Deer.  INR POC: 1.8.  Cuvette Lot#: [id number].  Exp: 05/2010.    Anticoagulation Management Assessment/Plan:      The patient's current anticoagulation dose is Warfarin sodium 5 mg tabs: Use as directed by Anticoagulation Clinic.  The target INR is 2.0-3.0.  The next INR is due 04/04/2009.  Anticoagulation instructions were given to patient/daughter.  Results were reviewed/authorized by Cloyde Reams, RN, BSN.  She was notified by Cloyde Reams RN.         Prior Anticoagulation Instructions: INR 2.2  Continue on same dosage 1 tablet daily except 1.5 tablets on Tuesdays.  Recheck in 1 week.    Current Anticoagulation Instructions: INR 1.8  Take 1.5 tablets today and tomorrow then resume same dosage 1 tablet daily except 1.5 tablets on Tuesdays.  Recheck in 10 days.

## 2010-02-28 NOTE — Medication Information (Signed)
Summary: rov/sp  Anticoagulant Therapy  Managed by: Cloyde Reams, RN, BSN Referring MD: J.John PCP: Dr. Oliver Barre Supervising MD: Graciela Husbands MD, Viviann Spare Indication 1: Atrial Fibrillation Lab Used: [facility] [place] Site: [address] INR POC 2.4 INR RANGE 2-3  Dietary changes: no    Health status changes: no    Bleeding/hemorrhagic complications: no    Recent/future hospitalizations: no    Any changes in medication regimen? no    Recent/future dental: no  Any missed doses?: no       Is patient compliant with meds? yes       Allergies: No Known Drug Allergies  Anticoagulation Management History:      The patient is taking warfarin and comes in today for a routine follow up visit.  Positive risk factors for bleeding include an age of 75 years or older.  The bleeding index is 'intermediate risk'.  Positive CHADS2 values include History of HTN.  Negative CHADS2 values include Age > 76 years old.  Her last INR was 2.1 ratio.  Anticoagulation responsible provider: Graciela Husbands MD, Viviann Spare.  INR POC: 2.4.  Cuvette Lot#: [id number].  Exp: 09/2010.    Anticoagulation Management Assessment/Plan:      The patient's current anticoagulation dose is Warfarin sodium 5 mg tabs: Use as directed by Anticoagulation Clinic.  The target INR is 2.0-3.0.  The next INR is due 08/16/2009.  Anticoagulation instructions were given to patient/daughter.  Results were reviewed/authorized by Cloyde Reams, RN, BSN.  She was notified by Cloyde Reams RN.         Prior Anticoagulation Instructions: INR 1.2  Pt missed two doses Friday and Saturday, took 2 tablets on Sunday.  Take 1.5 tablets today and tomorrow.  Then resume normal dosing of 1.5 tablets on Thursday and Saturday, 1 tablet Wednesday, Friday, Sunday, and Monday.  Recheck in 1 week.  Current Anticoagulation Instructions: INR 2.4  Continue on same dosage 1 tablet daily except 1.5 tablets on Thursdays and Saturdays.  Recheck in 2  weeks.

## 2010-03-02 NOTE — Letter (Signed)
Summary: [facility] - Delinquent Coumadin 1  Coumadin  [address]   [place], [place] [address]   Phone: [phone]  Fax: [phone]     February 20, 2010 MRN: [id number]   [facility] [address] [place], [place]  [address]   Dear Ms. Delucia,  This letter is being sent to you as a reminder that it is necessary for you to get your INR/PT checked regularly so that we can optimize your care.  Our records indicate that you were scheduled to have a test done recently.  As of today, we have not received the results of this test.  It is very important that you have your INR checked.  Please call our office at the number listed above to schedule an appointment at your earliest convenience.    If you have recently had your protime checked or have discontinued this medication, please contact our office at the above phone number to clarify this issue.  Thank you for this prompt attention to this important health care matter.  Sincerely,   [facility] Team    Appended Document: [facility] - Delinquent Coumadin 1 LMOM for pt to call for appt as well as letter.

## 2010-03-02 NOTE — Letter (Addendum)
Summary: [facility]  [facility]   Imported By: Lester [place] 02/22/2010 10:15:09  _____________________________________________________________________  External Attachment:    Type:   Image     Comment:   External Document

## 2010-03-03 NOTE — Letter (Signed)
[facility]   Imported By: Lennie Odor 05/24/2009 10:40:05  _____________________________________________________________________  External Attachment:    Type:   Image     Comment:   External Document

## 2010-03-03 NOTE — Miscellaneous (Signed)
Summary: OT Orders/[facility]  OT Orders/[facility]   Imported By: Sherian Rein 03/24/2009 10:13:04  _____________________________________________________________________  External Attachment:    Type:   Image     Comment:   External Document

## 2010-04-03 ENCOUNTER — Encounter: Payer: Self-pay | Admitting: Cardiology

## 2010-04-03 DIAGNOSIS — I4891 Unspecified atrial fibrillation: Secondary | ICD-10-CM

## 2010-04-04 ENCOUNTER — Other Ambulatory Visit: Payer: Self-pay | Admitting: Cardiology

## 2010-04-04 ENCOUNTER — Encounter (INDEPENDENT_AMBULATORY_CARE_PROVIDER_SITE_OTHER): Payer: Medicare Other

## 2010-04-04 ENCOUNTER — Ambulatory Visit (INDEPENDENT_AMBULATORY_CARE_PROVIDER_SITE_OTHER): Payer: Medicare Other | Admitting: Cardiology

## 2010-04-04 ENCOUNTER — Encounter: Payer: Self-pay | Admitting: Cardiology

## 2010-04-04 ENCOUNTER — Ambulatory Visit: Payer: Self-pay | Admitting: Cardiology

## 2010-04-04 DIAGNOSIS — Z7901 Long term (current) use of anticoagulants: Secondary | ICD-10-CM

## 2010-04-04 DIAGNOSIS — R0602 Shortness of breath: Secondary | ICD-10-CM

## 2010-04-04 DIAGNOSIS — I4891 Unspecified atrial fibrillation: Secondary | ICD-10-CM

## 2010-04-04 LAB — BASIC METABOLIC PANEL
BUN: 17 mg/dL (ref 6–23)
CO2: 25 mEq/L (ref 19–32)
Calcium: 8.7 mg/dL (ref 8.4–10.5)
GFR: 47.01 mL/min — ABNORMAL LOW (ref >60.00)
Glucose, Bld: 146 mg/dL — ABNORMAL HIGH (ref 70–99)

## 2010-04-11 NOTE — Assessment & Plan Note (Signed)
Summary: OV  Medications Added FUROSEMIDE 20 MG TABS (FUROSEMIDE) one daily KLOR-CON 10 10 MEQ CR-TABS (POTASSIUM CHLORIDE) one daily      Allergies Added: NKDA  Visit Type:  ov Primary Provider:  Dr. Oliver Barre   History of Present Illness: 77 yo with obesity, deconditioning, and atrial fibrillation presents for followup.  She was admitted in 1/11 with rapid heart rate and dyspnea and found to be in atrial fib with RVR.  She converted spontaneously back to NSR.  CTA chest was negative for PE.  She had a UTI at the time which may have been the trigger for the episode.    At baseline, she has dyspnea with exertion.  This has been chronic for a number of years but her physical therapist is worried about this and thinks it may be getting worse.  Her heart rate is rising high with any degree of activity.  She is short of breath walking around her house.  ? orthopnea => she sleeps on 2 pillows.  No chest pain.   She uses a wheelchair when she leaves the house.  Her daughter does not think that she is very motivated.  She has not had any tachypalpitations/irregular heart rate that she has noticed.  Her inactivity seems to be primarily due to knee pain from arthritis.  Per her daughter, she has gained a lot of weight over the last couple of years.  Weight is up 9 lbs since last visit.  She has had 1 fall that was due to her knee "giving out."   ECG: NSR, poor anterior R wave progression  Labs (1/11): K 3.5, creatinine 0.98 Labs (3/11): K 3.9, creatinine 1.1, BNP 22 Labs (11/11): K 4.6, creatinine 1.23  Current Medications (verified): 1)  Losartan Potassium 50 Mg Tabs (Losartan Potassium) .Marland Kitchen.. 1po Once Daily 2)  Tylenol/codeine #3 300-30 Mg  Tabs (Acetaminophen-Codeine) .Marland Kitchen.. 1 By Mouth Qid As Needed 3)  Aromasin 25 Mg Tabs (Exemestane) .Marland Kitchen.. 1 Tablet Daily 4)  Diltiazem Hcl Er Beads 240 Mg Xr24h-Cap (Diltiazem Hcl Er Beads) .Marland Kitchen.. 1 Capsule Daily 5)  Warfarin Sodium 5 Mg Tabs (Warfarin Sodium)  .... Use As Directed By Anticoagulation Clinic 6)  Omeprazole 20 Mg Cpdr (Omeprazole) .Marland Kitchen.. 1po Once Daily  Allergies (verified): No Known Drug Allergies  Past History:  Past Medical History: 1. Breast cancer (remote) s/p R mastectomy. 2. Hypertension: ACEI cough 3. Hyperlipidemia 4. Atrial flutter s/p ablation 5. Recurrent svt/narrow complex 6. Diet-controlled diabetes. 7. GERD 8. legally blind 9. Osteopenia 10. lumbar disc disease 11. Atrial fibrillation: Echo (1/11) showed EF 60-65%, grade I diastolic dysfunction, PASP 34 mmHg.  12. Lexiscan myoview (2/11): EF 74%, no ischemia or infarction.  13. Obesity 14. Chronic dyspnea  Family History: Reviewed history from 04/03/2010 and no changes required. Family History Hypertension - brother Family History Lung cancer - brother Family History Other cancer-Colon - father Family History of Respiratory disease - brother COPD Family History Stroke - mother  Social History: Reviewed history from 05/11/2009 and no changes required. Lives with daughter Quit smoking > 40 years ago Alcohol use-no Drug use-no  Review of Systems       All systems reviewed and negative except as per HPI.   Vital Signs:  Patient profile:   77 year old female Height:      60 inches Weight:      235.50 pounds BMI:     46.16 Pulse rate:   70 / minute BP sitting:   139 /  71  (left arm) Cuff size:   large  Vitals Entered By: Caralee Ates CMA (April 04, 2010 9:55 AM)  Physical Exam  General:  Well developed, well nourished, in no acute distress.  Obese.  Neck:  Neck supple, JVP about 8 cm. No masses, thyromegaly or abnormal cervical nodes. Lungs:  Crackles at bases bilaterally Heart:  Non-displaced PMI, chest non-tender; regular rate and rhythm, S1, S2 without murmurs, rubs or gallops. Carotid upstroke normal, no bruit.  Difficult to palpate pedal pulses. No edema, no varicosities. Abdomen:  Bowel sounds positive; abdomen soft and non-tender  without masses, organomegaly, or hernias noted. No hepatosplenomegaly. Extremities:  No clubbing or cyanosis. Neurologic:  Alert and oriented x 3. Psych:  Flat affect   Impression & Recommendations:  Problem # 1:  ATRIAL FIBRILLATION (ICD-427.31) No definite recurrence of atrial fibrillation.  Continue diltiazem CD.  Will continue warfarin for now.   If she has further falls, may need to reconsider.  She has had 1 fall due to her knee "giving out."  She needs to use her walker all the time.   Problem # 2:  DYSPNEA (ICD-786.05) Suspect that this is multifactorial, with obesity/deconditioning playing a large role.  There may be some diastolic CHF as well.  We will get a BNP today.  I will try her on Lasix 20 mg daily + KCl 10 mEq daily.  Repeat BMET/BNP in 2 wks.    Problem # 3:  HYPERTENSION (ICD-401.9) BP looks ok.   Other Orders: TLB-BNP (B-Natriuretic Peptide) (83880-BNPR) TLB-BMP (Basic Metabolic Panel-BMET) (80048-METABOL)  Patient Instructions: 1)  Your physician has recommended you make the following change in your medication:  2)  Start Lasix(furosemide) 20mg  daily. 3)  Start KCL(potassium) 10 mEq daily. 4)  Lab today---BMP/BNP  786.09 5)  Lab in 2 weeks--BMP/BNP 789.06 6)  Your physician recommends that you schedule a follow-up appointment in: 1 month with Dr Shirlee Latch. Prescriptions: KLOR-CON 10 10 MEQ CR-TABS (POTASSIUM CHLORIDE) one daily  #30 x 6   Entered by:   Katina Dung, RN, BSN   Authorized by:   Marca Ancona, MD   Signed by:   Katina Dung, RN, BSN on 04/04/2010   Method used:   [facility] [phone][facility] (retail)       [address], [place]  [address]       Ph: [phone] or [phone]       Fax: [phone]   RxID:   [id number] FUROSEMIDE 20 MG TABS (FUROSEMIDE) one daily  #30 x 6   Entered by:   Katina Dung, RN, BSN   Authorized by:   Marca Ancona, MD   Signed by:   Katina Dung, RN, BSN on 04/04/2010    Method used:   [facility] [phone]* (retail)       [address]       [place], [place]  [address]       Ph: [phone] or [phone]       Fax: [phone]   RxID:   [id number]

## 2010-04-11 NOTE — Medication Information (Signed)
Summary: crr/per pt daughter call=mj   Anticoagulant Therapy  Managed by: Windell Hummingbird, RN Referring MD: J.John PCP: Dr. Oliver Barre Supervising MD: Myrtis Ser MD, Tinnie Gens Indication 1: Atrial Fibrillation Lab Used: [facility] [place] Site: [address] INR POC 1.4 INR RANGE 2-3  Dietary changes: yes       Details: More leafy greens  Health status changes: no    Bleeding/hemorrhagic complications: no    Recent/future hospitalizations: no    Any changes in medication regimen? no    Recent/future dental: no  Any missed doses?: yes     Details: Missed 11/2 doses last week.  Is patient compliant with meds? yes       Allergies: No Known Drug Allergies  Anticoagulation Management History:      The patient is taking warfarin and comes in today for a routine follow up visit.  Positive risk factors for bleeding include an age of 12 years or older.  The bleeding index is 'intermediate risk'.  Positive CHADS2 values include History of HTN.  Negative CHADS2 values include Age > 46 years old.  Her last INR was 2.1 ratio.  Anticoagulation responsible provider: Myrtis Ser MD, Tinnie Gens.  INR POC: 1.4.  Cuvette Lot#: [id number].  Exp: 01/2011.    Anticoagulation Management Assessment/Plan:      The patient's current anticoagulation dose is Warfarin sodium 5 mg tabs: Use as directed by Anticoagulation Clinic.  The target INR is 2.0-3.0.  The next INR is due 04/18/2010.  Anticoagulation instructions were given to patient/daughter.  Results were reviewed/authorized by Windell Hummingbird, RN.  She was notified by Windell Hummingbird, RN.         Prior Anticoagulation Instructions: INR 2.03  Spoke with pt's daughter.  Continue same dose of 1 tablet every day except 1 1/2 tablets on Thursday and Saturday.  Per daughter's request, recheck INR in 4 weeks.    Current Anticoagulation Instructions: INR 1.4 Take another tablet today (2 tablets total).  Then resume taking 1 tablet every day, except take 1 1/2  tablets on Thursdays and Saturdays. Recheck in 2 weeks.

## 2010-04-16 LAB — DIFFERENTIAL
Basophils Relative: 1 % (ref 0–1)
Lymphocytes Relative: 28 % (ref 12–46)
Lymphs Abs: 2.2 10*3/uL (ref 0.7–4.0)
Monocytes Relative: 8 % (ref 3–12)
Neutro Abs: 4.7 10*3/uL (ref 1.7–7.7)
Neutrophils Relative %: 59 % (ref 43–77)

## 2010-04-16 LAB — CBC
HCT: 38.9 % (ref 36.0–46.0)
HCT: 39 % (ref 36.0–46.0)
Hemoglobin: 13.3 g/dL (ref 12.0–15.0)
Hemoglobin: 15.5 g/dL — ABNORMAL HIGH (ref 12.0–15.0)
MCHC: 33.1 g/dL (ref 30.0–36.0)
MCHC: 33.3 g/dL (ref 30.0–36.0)
MCHC: 34.3 g/dL (ref 30.0–36.0)
MCV: 92.4 fL (ref 78.0–100.0)
MCV: 93 fL (ref 78.0–100.0)
Platelets: 230 10*3/uL (ref 150–400)
RBC: 4.02 MIL/uL (ref 3.87–5.11)
RBC: 4.18 MIL/uL (ref 3.87–5.11)
RBC: 4.21 MIL/uL (ref 3.87–5.11)
RBC: 4.89 MIL/uL (ref 3.87–5.11)
WBC: 6.3 10*3/uL (ref 4.0–10.5)
WBC: 8.3 10*3/uL (ref 4.0–10.5)

## 2010-04-16 LAB — HEPARIN LEVEL (UNFRACTIONATED)
Heparin Unfractionated: 0.63 IU/mL (ref 0.30–0.70)
Heparin Unfractionated: 0.91 IU/mL — ABNORMAL HIGH (ref 0.30–0.70)

## 2010-04-16 LAB — BASIC METABOLIC PANEL
CO2: 20 mEq/L (ref 19–32)
CO2: 23 mEq/L (ref 19–32)
Calcium: 8.2 mg/dL — ABNORMAL LOW (ref 8.4–10.5)
Chloride: 103 mEq/L (ref 96–112)
Chloride: 110 mEq/L (ref 96–112)
Creatinine, Ser: 0.91 mg/dL (ref 0.4–1.2)
Creatinine, Ser: 1 mg/dL (ref 0.4–1.2)
Creatinine, Ser: 1.1 mg/dL (ref 0.4–1.2)
GFR calc Af Amer: 59 mL/min — ABNORMAL LOW (ref >60)
GFR calc Af Amer: >60 mL/min (ref >60)
GFR calc Af Amer: >60 mL/min (ref >60)
Sodium: 137 mEq/L (ref 135–145)
Sodium: 137 mEq/L (ref 135–145)

## 2010-04-16 LAB — D-DIMER, QUANTITATIVE: D-Dimer, Quant: 0.97 ug/mL-FEU — ABNORMAL HIGH (ref 0.00–0.48)

## 2010-04-16 LAB — CARDIAC PANEL(CRET KIN+CKTOT+MB+TROPI)
Relative Index: 1.8 (ref 0.0–2.5)
Total CK: 225 U/L — ABNORMAL HIGH (ref 7–177)
Troponin I: 0.01 ng/mL (ref 0.00–0.06)

## 2010-04-16 LAB — APTT: aPTT: 26 seconds (ref 24–37)

## 2010-04-16 LAB — PROTIME-INR
INR: 1.01 (ref 0.00–1.49)
INR: 1.1 (ref 0.00–1.49)
INR: 1.22 (ref 0.00–1.49)

## 2010-04-16 LAB — TSH: TSH: 5.26 u[IU]/mL — ABNORMAL HIGH (ref 0.350–4.500)

## 2010-04-16 LAB — BRAIN NATRIURETIC PEPTIDE: Pro B Natriuretic peptide (BNP): <30 pg/mL (ref 0.0–100.0)

## 2010-04-16 LAB — LIPID PANEL: Triglycerides: 75 mg/dL (ref <150)

## 2010-04-16 LAB — TROPONIN I: Troponin I: 0.02 ng/mL (ref 0.00–0.06)

## 2010-04-16 LAB — MAGNESIUM: Magnesium: 2 mg/dL (ref 1.5–2.5)

## 2010-04-16 LAB — CK TOTAL AND CKMB (NOT AT ARMC): Total CK: 151 U/L (ref 7–177)

## 2010-04-16 LAB — PHOSPHORUS: Phosphorus: 2.6 mg/dL (ref 2.3–4.6)

## 2010-04-17 ENCOUNTER — Ambulatory Visit: Payer: Self-pay | Admitting: Physician Assistant

## 2010-04-18 ENCOUNTER — Ambulatory Visit (INDEPENDENT_AMBULATORY_CARE_PROVIDER_SITE_OTHER): Payer: Medicare Other | Admitting: *Deleted

## 2010-04-18 ENCOUNTER — Encounter: Payer: Self-pay | Admitting: Cardiology

## 2010-04-18 DIAGNOSIS — R1013 Epigastric pain: Secondary | ICD-10-CM

## 2010-04-18 DIAGNOSIS — I4891 Unspecified atrial fibrillation: Secondary | ICD-10-CM

## 2010-04-18 LAB — BASIC METABOLIC PANEL
CO2: 25 mEq/L (ref 19–32)
Chloride: 109 mEq/L (ref 96–112)
Creatinine, Ser: 1.2 mg/dL (ref 0.4–1.2)
Potassium: 4.1 mEq/L (ref 3.5–5.1)

## 2010-04-18 LAB — BRAIN NATRIURETIC PEPTIDE: Pro B Natriuretic peptide (BNP): 25.1 pg/mL (ref 0.0–100.0)

## 2010-04-18 NOTE — Patient Instructions (Signed)
INR 1.6 (goal 2.0-3.0) Take one extra tablet (5mg ) today Then resume current dose from tomorrow: 1 tablet (5mg ) every day, except for Thursdays and Saturdays take 1.5 tablets (7.5mg )  Recheck INR in 2 weeks on April 3rd

## 2010-04-21 ENCOUNTER — Telehealth: Payer: Self-pay | Admitting: *Deleted

## 2010-04-21 NOTE — Telephone Encounter (Signed)
Message copied by Katina Dung on Fri Apr 21, 2010  3:23 PM ------      Message from: Marshall, Freida Busman      Created: Thu Apr 20, 2010  2:59 PM       Labs ok

## 2010-04-21 NOTE — Telephone Encounter (Signed)
Notes Recorded by Jacqlyn Krauss, RN on 04/21/2010 at 3:23 PM Pt's daughter, [name] given results by telephone Notes Recorded by Marca Ancona on 04/20/2010 at 2:59 PM Labs ok Notes Recorded by Harriet Butte, RN on 04/20/2010 at 11:34 AM Preliminary result reviewed by Triage RN and sent to MD desktop for review.

## 2010-04-27 NOTE — Letter (Signed)
Summary: Generic Letter  Architectural technologist, Main Office  [address]   [place], [place] [address]   Phone: [phone]  Fax: [phone]        April 18, 2010 MRN: [id number]    [facility] [address] [place], [place]  [address]    Dear Ms. [facility],   Your lab done 04/18/10 was OK. Please call our office if you have any questions.        Sincerely,     Katina Dung, RN, BSN  This letter has been electronically signed by your physician.

## 2010-05-02 ENCOUNTER — Encounter: Payer: Medicare Other | Admitting: *Deleted

## 2010-05-08 ENCOUNTER — Ambulatory Visit: Payer: Self-pay | Admitting: Cardiology

## 2010-05-08 ENCOUNTER — Encounter: Payer: Medicare Other | Admitting: *Deleted

## 2010-05-18 ENCOUNTER — Other Ambulatory Visit (INDEPENDENT_AMBULATORY_CARE_PROVIDER_SITE_OTHER): Payer: Medicare Other

## 2010-05-18 ENCOUNTER — Ambulatory Visit (INDEPENDENT_AMBULATORY_CARE_PROVIDER_SITE_OTHER): Payer: Medicare Other | Admitting: Internal Medicine

## 2010-05-18 ENCOUNTER — Other Ambulatory Visit: Payer: Self-pay | Admitting: Internal Medicine

## 2010-05-18 ENCOUNTER — Encounter: Payer: Self-pay | Admitting: Internal Medicine

## 2010-05-18 VITALS — BP 122/80 | HR 82 | Temp 98.5°F | Ht 60.0 in | Wt 229.0 lb

## 2010-05-18 DIAGNOSIS — G4710 Hypersomnia, unspecified: Secondary | ICD-10-CM

## 2010-05-18 DIAGNOSIS — R5383 Other fatigue: Secondary | ICD-10-CM

## 2010-05-18 DIAGNOSIS — R7302 Impaired glucose tolerance (oral): Secondary | ICD-10-CM

## 2010-05-18 DIAGNOSIS — E785 Hyperlipidemia, unspecified: Secondary | ICD-10-CM

## 2010-05-18 DIAGNOSIS — R5381 Other malaise: Secondary | ICD-10-CM

## 2010-05-18 DIAGNOSIS — R7309 Other abnormal glucose: Secondary | ICD-10-CM

## 2010-05-18 DIAGNOSIS — R351 Nocturia: Secondary | ICD-10-CM

## 2010-05-18 HISTORY — DX: Nocturia: R35.1

## 2010-05-18 HISTORY — DX: Hypersomnia, unspecified: G47.10

## 2010-05-18 LAB — BASIC METABOLIC PANEL
BUN: 14 mg/dL (ref 6–23)
CO2: 26 mEq/L (ref 19–32)
Chloride: 104 mEq/L (ref 96–112)
GFR: 46.54 mL/min — ABNORMAL LOW (ref >60.00)
Glucose, Bld: 96 mg/dL (ref 70–99)
Potassium: 4.7 mEq/L (ref 3.5–5.1)
Sodium: 140 mEq/L (ref 135–145)

## 2010-05-18 LAB — HEPATIC FUNCTION PANEL
ALT: 43 U/L — ABNORMAL HIGH (ref 0–35)
AST: 42 U/L — ABNORMAL HIGH (ref 0–37)
Albumin: 3.7 g/dL (ref 3.5–5.2)
Alkaline Phosphatase: 65 U/L (ref 39–117)
Total Protein: 6.9 g/dL (ref 6.0–8.3)

## 2010-05-18 LAB — CBC WITH DIFFERENTIAL/PLATELET
Basophils Absolute: 0 10*3/uL (ref 0.0–0.1)
Eosinophils Relative: 4 % (ref 0.0–5.0)
Lymphocytes Relative: 23.3 % (ref 12.0–46.0)
Monocytes Relative: 6.6 % (ref 3.0–12.0)
Neutrophils Relative %: 65.8 % (ref 43.0–77.0)
Platelets: 265 10*3/uL (ref 150.0–400.0)
RDW: 13.1 % (ref 11.5–14.6)
WBC: 8 10*3/uL (ref 4.5–10.5)

## 2010-05-18 LAB — URINALYSIS, ROUTINE W REFLEX MICROSCOPIC
Hgb urine dipstick: NEGATIVE
Ketones, ur: NEGATIVE
Leukocytes, UA: NEGATIVE
Specific Gravity, Urine: 1.02 (ref 1.000–1.030)
Urine Glucose: NEGATIVE
Urobilinogen, UA: 0.2 (ref 0.0–1.0)

## 2010-05-18 LAB — TSH: TSH: 3.03 u[IU]/mL (ref 0.35–5.50)

## 2010-05-18 LAB — HEMOGLOBIN A1C: Hgb A1c MFr Bld: 6.7 % — ABNORMAL HIGH (ref 4.6–6.5)

## 2010-05-18 MED ORDER — DARIFENACIN HYDROBROMIDE ER 15 MG PO TB24: 15.0000 mg | ORAL_TABLET | Freq: Every day | ORAL | Status: DC

## 2010-05-18 NOTE — Assessment & Plan Note (Signed)
stable overall by hx and exam, most recent lab reviewed with pt, and pt to continue medical treatment as before  Lab Results  Component Value Date   [facility]  Value: 103        Total Cholesterol/HDL:CHD Risk Coronary Heart Disease Risk Table                     Men   Women  1/2 Average Risk   3.4   3.3  Average Risk       5.0   4.4  2 X Average Risk   9.6   7.1  3 X Average Risk  23.4   11.0        Use the calculated Patient Ratio above and the CHD Risk Table to determine the patient's CHD Risk.        ATP III CLASSIFICATION (LDL):  <100     mg/dL   Optimal  308-657  mg/dL   Near or Above                    Optimal  130-159  mg/dL   Borderline  846-962  mg/dL   High  >952     mg/dL   Very High* 8/41/3244

## 2010-05-18 NOTE — Assessment & Plan Note (Addendum)
Etiology unclear, Exam otherwise benign for acute, most related to chronic comorbids including deconditioning, to check labs as documented, follow with expectant management

## 2010-05-18 NOTE — Progress Notes (Signed)
Subjective:    Patient ID: Rose Sullivan, female    DOB: 1933-02-15, 77 y.o.   MRN: [id number]  HPI  Here to f/u after seeing oncology and cards recently, overall doing ok since then, did see ortho and had course of PT which has helped the knee; did have recent elev glucose 157 with recent lab draw - no hx of DM; has gained signficant wt, has non restorative sleep over the past few months, tends to have irreg sleep habits, but overall seems to be awake only for about 5 hrs per day, pt snores and breathes heavy at night, has nocturia about 5 times per night and daughter who lives with her thinks the main issue is too much sleep during the day, not as much at night.  Denies worsening depressive symptoms, suicidal ideation, or panic, though has ongoing anxiety, not increased recently.   Pt denies fever, wt loss, night sweats, loss of appetite, or other constitutional symptoms  Overall good compliance with treatment, and good medicine tolerability.  Pt denies chest pain, increased sob at rest, wheezing, orthopnea, PND, increased LE swelling, palpitations, dizziness or syncope, but does have some DOE from room to room with PT in the home.  Pt denies new neurological symptoms such as new headache, or facial or extremity weakness or numbness   Pt denies polydpsia, polyuria  Pt states overall good compliance with meds, trying to follow lower cholesterol diet, wt overall stable but little exercise however.   Does have sense of ongoing fatigue, but denies signficant hypersomnolence. Denies worsening depressive symptoms, suicidal ideation, or panic, though has ongoing anxiety, not increased recently.  Past Medical History  Diagnosis Date  . GLUCOSE INTOLERANCE 02/11/2007  . HYPERLIPIDEMIA 02/11/2007  . 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 Other specified forms of hearing loss 09/05/2009  . HYPERTENSION 02/11/2007  . Atrial fibrillation 03/07/2009  . GERD 02/11/2007  . LOW BACK PAIN, CHRONIC 11/25/2006  .  BACK PAIN 12/10/2007  . Pain in Soft Tissues of Limb 02/11/2007  . OSTEOPENIA 02/11/2007  . FATIGUE 12/10/2007  . GAIT DISTURBANCE 12/10/2007  . DYSPNEA 03/18/2009  . Cough 03/07/2009  . CHEST PAIN 12/10/2007  . Abdominal pain, left lower quadrant 04/01/2007  . BREAST CANCER, HX OF 11/25/2006  . Nocturia 05/18/2010  . Hypersomnia 05/18/2010  . Impaired glucose tolerance 05/18/2010   Past Surgical History  Procedure Date  . Dilation and curettage of uterus   . S/p mastectomy   . Appendectomy     reports that she has quit smoking. She does not have any smokeless tobacco history on file. She reports that she does not drink alcohol or use illicit drugs. family history includes COPD in her brother; Colon cancer in her father; Hypertension in her brother; Lung cancer in her brother; and Stroke in her mother. No Known Allergies Current Outpatient Prescriptions on File Prior to Visit  Medication Sig Dispense Refill  . acetaminophen-codeine (TYLENOL #3) 300-30 MG per tablet Take 1 tablet by mouth every 4 (four) hours as needed.        . cyclobenzaprine (FLEXERIL) 5 MG tablet Take 5 mg by mouth 3 (three) times daily as needed.        . diltiazem (DILACOR XR) 240 MG 24 hr capsule Take 240 mg by mouth daily.        Marland Kitchen exemestane (AROMASIN) 25 MG tablet Take 25 mg by mouth daily.        Marland Kitchen warfarin (COUMADIN) 5 MG tablet Take by  mouth as directed.        . zolendronic acid (ZOMETA) 4 MG/5ML injection Inject 4 mg into the vein. 4 mg every other month hold       . DISCONTD: losartan (COZAAR) 50 MG tablet Take 50 mg by mouth daily.        Marland Kitchen DISCONTD: omeprazole (PRILOSEC) 20 MG capsule Take 20 mg by mouth daily.         Review of Systems Review of Systems  Constitutional: Negative for diaphoresis and unexpected weight change.  HENT: Negative for drooling and tinnitus.   Eyes: Negative for photophobia and visual disturbance.  Respiratory: Negative for choking and stridor.   Gastrointestinal: Negative for  vomiting and blood in stool.  Genitourinary: Negative for hematuria and decreased urine volume.  Musculoskeletal: Negative for gait problem.  Skin: Negative for color change and wound.  Neurological: Negative for tremors and numbness.  Psychiatric/Behavioral: Negative for decreased concentration. The patient is not hyperactive.       Objective:   Physical Exam BP 122/80  Pulse 82  Temp(Src) 98.5 F (36.9 C) (Oral)  Ht 5' (1.524 m)  Wt 229 lb (103.874 kg)  BMI 44.72 kg/m2  SpO2 95% Physical Exam  VS noted Blind, obese Constitutional: Pt appears well-developed and well-nourished.  HENT: Head: Normocephalic.  Right Ear: External ear normal.  Left Ear: External ear normal.  Eyes: Conjunctivae and EOM are normal. Pupils are equal, round, and reactive to light.  Neck: Normal range of motion. Neck supple.  Cardiovascular:Irreg irreg   Pulmonary/Chest: Effort normal and breath sounds normal.  Abd:  Soft, NT, non-distended, + BS Neurological: Pt is alert. No cranial nerve deficit.  Skin: Skin is warm. No erythema.  No edema Psychiatric: Pt behavior is normal. Thought content normal.         Assessment & Plan:

## 2010-05-18 NOTE — Patient Instructions (Signed)
Take all new medications as prescribed - the enablex for the bladder Continue all other medications as before Please go to LAB in the Basement for the blood and/or urine tests to be done today Please call the number on the Blue Card (the [employer]Tree System) for results of testing in 2-3 days You will be contacted regarding the referral for: overnight oximetry

## 2010-05-18 NOTE — Assessment & Plan Note (Signed)
?   Sleep apnea,  Daughter does not want pulm eval at this time, but will ask for ONO per home health

## 2010-05-18 NOTE — Assessment & Plan Note (Signed)
stable overall by hx and exam, most recent lab reviewed with pt, and pt to continue medical treatment as before, to check a1c, and OHA for a1c > 7

## 2010-05-18 NOTE — Assessment & Plan Note (Signed)
?   Etiology, I suspect OAB, and will check UA to r/o infection, but also start enablex asd

## 2010-05-18 NOTE — Progress Notes (Signed)
Quick Note:  Voice message left on PhoneTree system - lab is negative, normal or otherwise stable, pt to continue same tx ______ 

## 2010-05-19 ENCOUNTER — Other Ambulatory Visit: Payer: Self-pay

## 2010-05-19 MED ORDER — ACETAMINOPHEN-CODEINE #3 300-30 MG PO TABS: 1.0000 | ORAL_TABLET | Freq: Four times a day (QID) | ORAL | Status: DC | PRN

## 2010-05-19 NOTE — Telephone Encounter (Signed)
Done hardcopy to dahlia/LIM B  

## 2010-05-19 NOTE — Telephone Encounter (Signed)
[facility]. Request refill on Acetaminophen -COD #3 last refill 01/02/10 #120 with 0 refills and last OV 09/05/09.

## 2010-05-20 NOTE — Telephone Encounter (Signed)
Rx faxed to pharmacy  

## 2010-05-27 ENCOUNTER — Other Ambulatory Visit: Payer: Self-pay | Admitting: Cardiology

## 2010-05-29 ENCOUNTER — Other Ambulatory Visit: Payer: Self-pay | Admitting: Oncology

## 2010-05-29 ENCOUNTER — Encounter (HOSPITAL_BASED_OUTPATIENT_CLINIC_OR_DEPARTMENT_OTHER): Payer: Medicare Other | Admitting: Oncology

## 2010-05-29 DIAGNOSIS — C7800 Secondary malignant neoplasm of unspecified lung: Secondary | ICD-10-CM

## 2010-05-29 DIAGNOSIS — C50319 Malignant neoplasm of lower-inner quadrant of unspecified female breast: Secondary | ICD-10-CM

## 2010-05-29 DIAGNOSIS — C801 Malignant (primary) neoplasm, unspecified: Secondary | ICD-10-CM

## 2010-05-29 DIAGNOSIS — Z7901 Long term (current) use of anticoagulants: Secondary | ICD-10-CM

## 2010-05-29 DIAGNOSIS — M899 Disorder of bone, unspecified: Secondary | ICD-10-CM

## 2010-05-29 LAB — CBC WITH DIFFERENTIAL/PLATELET
BASO%: 0.5 % (ref 0.0–2.0)
Eosinophils Absolute: 0.4 10*3/uL (ref 0.0–0.5)
LYMPH%: 35.7 % (ref 14.0–49.7)
MCHC: 34.5 g/dL (ref 31.5–36.0)
MCV: 92.4 fL (ref 79.5–101.0)
MONO%: 7.2 % (ref 0.0–14.0)
NEUT#: 4.1 10*3/uL (ref 1.5–6.5)
Platelets: 272 10*3/uL (ref 145–400)
RBC: 4.5 10*6/uL (ref 3.70–5.45)
RDW: 12.9 % (ref 11.2–14.5)
WBC: 7.9 10*3/uL (ref 3.9–10.3)

## 2010-05-30 LAB — COMPREHENSIVE METABOLIC PANEL
ALT: 32 U/L (ref 0–35)
AST: 34 U/L (ref 0–37)
Albumin: 4.1 g/dL (ref 3.5–5.2)
Alkaline Phosphatase: 61 U/L (ref 39–117)
Potassium: 4 mEq/L (ref 3.5–5.3)
Sodium: 139 mEq/L (ref 135–145)
Total Bilirubin: 0.6 mg/dL (ref 0.3–1.2)
Total Protein: 6.8 g/dL (ref 6.0–8.3)

## 2010-06-01 ENCOUNTER — Encounter: Payer: Medicare Other | Admitting: *Deleted

## 2010-06-05 ENCOUNTER — Encounter (HOSPITAL_BASED_OUTPATIENT_CLINIC_OR_DEPARTMENT_OTHER): Payer: Medicare Other | Admitting: Oncology

## 2010-06-05 ENCOUNTER — Other Ambulatory Visit: Payer: Self-pay | Admitting: Oncology

## 2010-06-05 DIAGNOSIS — N6019 Diffuse cystic mastopathy of unspecified breast: Secondary | ICD-10-CM

## 2010-06-05 DIAGNOSIS — Z1231 Encounter for screening mammogram for malignant neoplasm of breast: Secondary | ICD-10-CM

## 2010-06-05 DIAGNOSIS — C801 Malignant (primary) neoplasm, unspecified: Secondary | ICD-10-CM

## 2010-06-05 DIAGNOSIS — C50319 Malignant neoplasm of lower-inner quadrant of unspecified female breast: Secondary | ICD-10-CM

## 2010-06-05 DIAGNOSIS — Z7901 Long term (current) use of anticoagulants: Secondary | ICD-10-CM

## 2010-06-05 DIAGNOSIS — M949 Disorder of cartilage, unspecified: Secondary | ICD-10-CM

## 2010-06-05 DIAGNOSIS — C7800 Secondary malignant neoplasm of unspecified lung: Secondary | ICD-10-CM

## 2010-06-06 ENCOUNTER — Ambulatory Visit (INDEPENDENT_AMBULATORY_CARE_PROVIDER_SITE_OTHER): Payer: Medicare Other | Admitting: *Deleted

## 2010-06-06 ENCOUNTER — Ambulatory Visit (INDEPENDENT_AMBULATORY_CARE_PROVIDER_SITE_OTHER): Payer: Medicare Other | Admitting: Cardiology

## 2010-06-06 ENCOUNTER — Encounter: Payer: Self-pay | Admitting: Cardiology

## 2010-06-06 DIAGNOSIS — R0602 Shortness of breath: Secondary | ICD-10-CM

## 2010-06-06 DIAGNOSIS — I4891 Unspecified atrial fibrillation: Secondary | ICD-10-CM

## 2010-06-06 DIAGNOSIS — I10 Essential (primary) hypertension: Secondary | ICD-10-CM

## 2010-06-06 MED ORDER — WARFARIN SODIUM 5 MG PO TABS: ORAL_TABLET | ORAL | Status: DC

## 2010-06-08 NOTE — Assessment & Plan Note (Signed)
Suspect that this is multifactorial, with obesity/deconditioning playing a large role.  There may be some diastolic CHF as well.  She will continue to take Lasix at current dose.  She needs to try to be more active (though her daughter says she is very unmotivated).  I suggested that she try walking down the driveway several times a day.

## 2010-06-08 NOTE — Assessment & Plan Note (Signed)
No definite recurrence of atrial fibrillation.  Continue diltiazem CD.  Will continue warfarin for now as she has had no further falls.

## 2010-06-08 NOTE — Progress Notes (Signed)
PCP:  Dr. Oliver Barre  77 yo with obesity, deconditioning, and atrial fibrillation presents for followup.  She was admitted in 1/11 with rapid heart rate and dyspnea and found to be in atrial fib with RVR.  She converted spontaneously back to NSR.  CTA chest was negative for PE.  She had a UTI at the time which may have been the trigger for the episode.    At baseline, she has dyspnea with exertion. She is chronically short of breath walking around her house.  ? orthopnea => she sleeps on 2 pillows.  No chest pain.   She uses a wheelchair when she leaves the house.  She is really fairly inactive. Her inactivity seems to be primarily due to knee pain from arthritis.  She does occasionally walk to the end of the driveway and back.  Weight has actually gone down about 6 lbs since I last saw her.  No falls.  No recent tachypalpitations.  Labs (1/11): K 3.5, creatinine 0.98 Labs (3/11): K 3.9, creatinine 1.1, BNP 22 Labs (11/11): K 4.6, creatinine 1.23 Labs (4/12): K 4, creatinine 1.48, ALT 42, AST 42  Allergies (verified):  No Known Drug Allergies  Past Medical History: 1. Breast cancer (remote) s/p R mastectomy. 2. Hypertension: ACEI cough 3. Hyperlipidemia 4. Atrial flutter s/p ablation 5. Recurrent svt/narrow complex 6. Diet-controlled diabetes. 7. GERD 8. legally blind 9. Osteopenia 10. lumbar disc disease 11. Atrial fibrillation: Paroxysmal.  Echo (1/11) showed EF 60-65%, grade I diastolic dysfunction, PASP 34 mmHg.  12. Lexiscan myoview (2/11): EF 74%, no ischemia or infarction.  13. Obesity 14. Chronic dyspnea 15. Osteoarthritis with chronic knee and hip pain  Family History: Family History Hypertension - brother Family History Lung cancer - brother Family History Other cancer-Colon - father Family History of Respiratory disease - brother COPD Family History Stroke - mother  Social History: Lives with daughter Quit smoking > 40 years ago Alcohol use-no Drug  use-no  Current Outpatient Prescriptions  Medication Sig Dispense Refill  . acetaminophen-codeine (TYLENOL #3) 300-30 MG per tablet Take 1 tablet by mouth every 6 (six) hours as needed.  120 tablet  5  . diltiazem (DILACOR XR) 240 MG 24 hr capsule Take 240 mg by mouth daily.        Marland Kitchen exemestane (AROMASIN) 25 MG tablet Take 25 mg by mouth daily.        . furosemide (LASIX) 20 MG tablet Take 20 mg by mouth daily.        Marland Kitchen losartan (COZAAR) 50 MG tablet TAKE 1 TABLET BY MOUTH EVERY DAY  30 tablet  4  . omeprazole (PRILOSEC) 20 MG capsule TAKE ONE CAPSULE BY MOUTH EVERY DAY  30 capsule  4  . potassium chloride (K-DUR) 10 MEQ tablet Take 10 mEq by mouth daily.        . cyclobenzaprine (FLEXERIL) 5 MG tablet Take 5 mg by mouth 3 (three) times daily as needed.        . warfarin (COUMADIN) 5 MG tablet Take as directed by the coumadin clinic.  35 tablet  0   BP 128/90  Pulse 78  Ht 5' (1.524 m)  Wt 229 lb (103.874 kg)  BMI 44.72 kg/m2 General:  Well developed, well nourished, in no acute distress.  Obese.  Neck:  Neck supple, JVP not elevated. No masses, thyromegaly or abnormal cervical nodes. Lungs:  Crackles at bases bilaterally Heart:  Non-displaced PMI, chest non-tender; regular rate and rhythm, S1, S2 without murmurs,  rubs or gallops. Carotid upstroke normal, no bruit.  Difficult to palpate pedal pulses. No edema, no varicosities. Abdomen:  Bowel sounds positive; abdomen soft and non-tender without masses, organomegaly, or hernias noted. No hepatosplenomegaly. Extremities:  No clubbing or cyanosis. Neurologic:  Alert and oriented x 3. Psych:  Flat affect

## 2010-06-08 NOTE — Assessment & Plan Note (Signed)
BP is reasonably controlled.  

## 2010-06-12 ENCOUNTER — Ambulatory Visit: Payer: Medicare Other | Admitting: *Deleted

## 2010-06-12 ENCOUNTER — Other Ambulatory Visit: Payer: Self-pay | Admitting: Cardiology

## 2010-06-13 ENCOUNTER — Other Ambulatory Visit: Payer: Self-pay | Admitting: Internal Medicine

## 2010-06-16 NOTE — Op Note (Signed)
Rose Sullivan, Rose Sullivan            ACCOUNT NO.:  [id number]   MEDICAL RECORD NO.:  [id number]          PATIENT TYPE:  INP   LOCATION:  5733                         FACILITY:  [facility]   PHYSICIAN:  Velora Heckler, MD      DATE OF BIRTH:  1934/01/01   DATE OF PROCEDURE:  11/26/2004  DATE OF DISCHARGE:                                 OPERATIVE REPORT   PREOPERATIVE DIAGNOSES:  1.  Necrosis of skin flaps, right chest wall wound.  2.  Cellulitis.  3.  Dehiscence of wound.  4.  Wound infection.   POSTOPERATIVE DIAGNOSES:  1.  Necrosis of skin flaps, right chest wall wound.  2.  Cellulitis.  3.  Dehiscence of wound.  4.  Wound infection.   PROCEDURE:  Sharp debridement of full-thickness skin flaps and of right  chest wall wound with open packing.   SURGEON:  Velora Heckler, MD   ANESTHESIA:  None.   INDICATIONS:  The patient is a 78 year old white female from [place],  [place] who underwent right mastectomy for breast carcinoma on  November 15, 2004 at [facility]. The patient has had necrosis of  both the superior and inferior skin flaps. She has developed a wound  infection. She has developed nonfunctioning of her drains. She now has a  superficial cellulitis. The patient presents to the emergency department.   PROCEDURE:  At the bedside, staples were removed from the wound and the  wound was opened throughout its full extent. Jackson-Pratt drains were  removed by cutting the retaining sutures and removing both drains. These  were discarded. The wound was widely opened throughout its entire extent and  debrided. Both the superior and inferior skin flaps had areas of full-  thickness necrosis; these were sharply debrided with scissors and forceps.  The  wound was then packed with moistened Kerlix gauze rolls. Three complete  rolls of Kerlix were placed within the wound. The wound was then covered  with 4x4s, followed by ABD pads and secured with 4-inch Hypafix  tape. The  patient tolerated the procedure well.      Velora Heckler, MD  Electronically Signed     TMG/MEDQ  D:  11/26/2004  T:  11/27/2004  Job:  [id number]   cc:   Lebron Conners, M.D.  [address]  [place]  [place] [address]

## 2010-06-16 NOTE — H&P (Signed)
[facility]  Patient:    Rose Sullivan, Rose Sullivan Visit Number: [id number] MRN: [id number]          Service Type: DSU Location: [facility] Attending Physician:  Wandalee Ferdinand Dictated by:   Rudy Jew Ashley Royalty, M.D. Admit Date:  08/21/2001                           History and Physical  HISTORY OF PRESENT ILLNESS:   This is a 77 year old, gravida 3, para 3, referred to my office after presenting to [facility], May 2003 with vaginal bleeding. She is some 20 years postmenopausal and experienced acute onset vaginal bleeding for which she presented to the emergency room. A mass was seen at the time of the pelvic examination and she was subsequently referred to my office. Initial examination at my office revealed, on pelvic examination, an exophytic mass emanating from the cervical os. Its origin could not be definitely obtained. She returned Jun 17, 2001, and underwent an incisional biopsy of the mass, which ultimately turned out on pathology to represent a benign inflamed endocervical polyp. She presented July 24, 2001, for sonohysterogram. Ultrasound on that day revealed normal size uterus. The right adnexa contained the 1.7 cm, smoothly marginated cyst. The left adnexa was not identified. Sonohysterogram revealed a 10 mm fundal polyp. Interstingly, the previously noted large cervical polyp was not seen at that study. The patient presents for diagnostic laparoscopy with a hysteroscopy and dilatation and curettage.  MEDICATIONS:                  Unknown antihypertensive medication and an unknown medication for hiatal hernia.  PAST MEDICAL HISTORY:         1. Hypertension.                               2. Hiatal hernia.  PAST SURGICAL HISTORY:        1. Status post bilateral tubal sterilization                                  procedure.                               2. Knee surgery.  ALLERGIES:                    None.  FAMILY  HISTORY:               Positive for hypertension.  SOCIAL HISTORY:               The patient denies the use or significant alcohol.  REVIEW OF SYSTEMS:            Noncontributory.  PHYSICAL EXAMINATION:  GENERAL:                      Well-developed, well-nourished pleasant, elderly, white female in no acute distress.  VITAL SIGNS:                  Afebrile.  All vital signs stable.  SKIN:                         Warm and dry without lesions except  fleshy skin appendages, mostly on the trunk. None appears suspicious for malignancy.  LYMPHPATICS:                  There is no supraclavicular, cervical, or inguinal adenopathy.  HEENT:                        Normocephalic.  NECK:                         Supple without thyromegaly.  CHEST:                        Lungs are clear.  CARDIAC:                      Regular rate and rhythm without murmurs, gallops or rubs.  BREASTS:                      Exam deferred.  ABDOMEN:                      Soft and nontender without masses or organomegaly. Bowel sounds are active.  MUSCULOSKELETAL:              Examination revealed full range of motion without edema, cyanosis or CVA tenderness.  PELVIC:                       Examination deferred until examination under anesthesia.  IMPRESSION:                   1. Exophytic cervical mass noted on May 2003                                  pelvic examination with subsequent incisional                                  biopsy revealing polyp.                               2. Uterus upper limits of normal size to                                  slightly enlarged - differential includes                                  normal versus fibroid versus neoplasia.                               3. Hypertension.                               4. Hiatal hernia.                               5. Noncompliance.  GYNECOLOGIC CARE PLAN:        1. Diagnostic/operative hysteroscopy.  2. Dilatation and curettage.  Risks, benefits, complications and alternatives fully discussed with the patient.  She states she understands and accepts.  Questions invited and answered. Dictated by:   Rudy Jew Ashley Royalty, M.D. Attending Physician:  Wandalee Ferdinand DD:  08/21/01 TD:  08/21/01 Job: [id number]

## 2010-06-16 NOTE — Op Note (Signed)
[facility]  Patient:    Rose Sullivan, Rose Sullivan Visit Number: [id number] MRN: [id number]          Service Type: MED Location: 3W [phone] Attending Physician:  Corwin Levins Dictated by:   Zigmund Daniel, M.D. Proc. Date: 06/30/01 Admit Date:  06/30/2001   CC:         Corwin Levins, M.D. [facility]  Molly Maduro D. Arlyce Dice, M.D. [facility]   Operative Report  PREOPERATIVE DIAGNOSIS:  Gallstones with recovering pancreatitis.  POSTOPERATIVE DIAGNOSIS:  Gallstones with recovering pancreatitis.  OPERATION:  Laparoscopic cholecystectomy with cholangiogram.  SURGEON:  Zigmund Daniel, M.D.  ASSISTANT:  Thornton Park. Daphine Deutscher, M.D.  ANESTHESIA:  General.  FINDINGS:  There was mild retroperitoneal edema.  The gallbladder was slightly distended but not acutely inflamed.  There were some chronic inflammatory adhesions.  The operative cholangiogram demonstrated a large cystic duct and a large common duct and intrahepatic radicles of the ducts with a narrowing of the duct and a long string sign on head of the pancreas but with free flow into the duodenum and no evident common bile duct stones.  DESCRIPTION OF PROCEDURE:  After the patient was monitored and anesthetized and had routine preparation and draping of the abdomen, I infused local anesthetic just below the umbilicus and made a 2.5 cm transverse incision, opened the fascia longitudinally in the midline, opened the peritoneum bluntly, and swept my finger around to assure that adhesions were not a problem.  I then put in a 0 Vicryl pursestring suture and secured a Hasson cannula then inflated the abdomen with CO2.  The findings were as noted.  I anesthetized three additional port sites and put in our standard three additional ports and positioned the patient head-up, foot-down, and tilted to the left.  Grasping the fundus of the gallbladder and elevating it toward the right shoulder, I took the chronic adhesions using  scissors and cattier and then visualized the infundibulum of the gallbladder and pulled it laterally. There was mild edema in the porta hepaticae due to the pancreatitis.  I then dissected through the fat until I clearly identified the cystic duct as utilized emerged from the infundibulum of the gallbladder.  I made a small hole in it after clipping it toward the gallbladder, and a little bit of sludge came out.  I put in a cholangiogram catheter and secured it with a clip and performed an operative fluoroscopic cholangiogram which demonstrated the findings noted above.  I then removed the cholangiogram catheter and placed two clips on the cystic duct distal to the hole and divided the cystic duct. I then dissected out, clipped, and divided the cystic artery and then removed the gallbladder from the liver using the cautery.  I got hemostasis in the gallbladder fossa with the cautery.  I irrigated the area briefly and removed the irrigant.  It appeared that the clips were secure, that there was no leakage of bile, and that there was good hemostasis.  I removed the gallbladder from the body through the umbilical incision, then replaced the port and moved the camera back to the umbilical incision, then passed a round suction drain through the epigastric port and positioned it in the gallbladder fossa, brought out through one of the lateral incisions.  I secured that to the skin with suture.  I then connected back to a bulb.  I removed the remaining ports under direct vision without any evident bleeding.  After allowing the CO2 to escape,  I removed the umbilical port tied the pursestring suture.  I closed all the skin incisions with intracuticular 4-0 Vicryl and Steri-Strips.  The patient was stable through the procedure. Dictated by:   Zigmund Daniel, M.D. Attending Physician:  Corwin Levins DD:  07/03/01 TD:  07/04/01 Job: [id number]

## 2010-06-16 NOTE — Discharge Summary (Signed)
Rose Sullivan, Rose Sullivan            ACCOUNT NO.:  [id number]   MEDICAL RECORD NO.:  [id number]          PATIENT TYPE:  INP   LOCATION:  5742                         FACILITY:  [facility]   PHYSICIAN:  Lebron Conners, M.D.   DATE OF BIRTH:  11/07/1933   DATE OF ADMISSION:  11/26/2004  DATE OF DISCHARGE:  12/01/2004                                 DISCHARGE SUMMARY   HISTORY:  The patient is a 77 year old white female who was recently status  post right mastectomy for cancer. On November 26, 2004, she was seen by my  associate Dr. Darnell Level for wound dehiscence and cellulitis and necrosis  of skin flaps of the mastectomy wound. He admitted her to the hospital. The  patient did not have a fever and was stable. See the history and physical  for greater details. White count was 10,800.   HOSPITAL COURSE:  On the day of admission, Dr. Gerrit Friends took the patient to  the operating room and did sharp debridement of full-thickness of skin flaps  and then left her with open packing of the right chest wall wound. Dressing  changes were instituted. It began healing promptly. I felt that she was a  good candidate for vacuum-assisted closure and made arrangements for that.  When the home support arrangements were in place, the patient was discharged  and arrangements made for follow up with me in the office.   DIAGNOSES:  1.  Wound infection.  2.  Recently status post mastectomy for cancer.  3.  Hypertension.   OPERATION:  Debridement of wound.   DISCHARGE CONDITION:  Improved.      Lebron Conners, M.D.  Electronically Signed     WB/MEDQ  D:  01/08/2005  T:  01/08/2005  Job:  [id number]

## 2010-06-16 NOTE — Op Note (Signed)
NAMEOWEN, PAGNOTTA            ACCOUNT NO.:  [id number]   MEDICAL RECORD NO.:  [id number]          PATIENT TYPE:  INP   LOCATION:  2899                         FACILITY:  [facility]   PHYSICIAN:  Lebron Conners, M.D.   DATE OF BIRTH:  February 12, 1933   DATE OF PROCEDURE:  11/15/2004  DATE OF DISCHARGE:                                 OPERATIVE REPORT   PREOPERATIVE DIAGNOSIS:  Carcinoma of the right breast.   POSTOPERATIVE DIAGNOSIS:  Carcinoma of the right breast.   OPERATION PERFORMED:  Right total mastectomy with sentinel lymph node biopsy  right axilla.   SURGEON:  Lebron Conners, M.D.   ANESTHESIA:  General.   SPECIMENS:  Right breast and right axillary lymph nodes.   ESTIMATED BLOOD LOSS:  150 mL.   COMPLICATIONS:  None.   POSTOP CONDITION:  Good.   DESCRIPTION OF PROCEDURE:  Following induction of anesthesia and monitoring  of the patient and routine preparation of the right breast and axilla and  anterior chest, I made an elliptical incision around the nipple and areola  and took a fair amount of skin medially over the tumor which was fairly  bulky and closed to the surface.  I then created skin and subcutaneous flaps  cephalad to about the clavicle, medial to the sternum, caudad to the rectus  fascia, laterally to the latissimus dorsi muscle.  I dissected along the  subcutaneous plane up into the axilla.  Prior to beginning the operation and  prep, I had injected blue dye into the subareolar area.  Prior to coming to  the operating room the patient had had injection of radioactive tracer in  the same area.  I found an area with increased radioactivity in the low  axilla and dissected to it and found a couple of small and intensely blue  stained, intensely radioactive lymph nodes.  I excised those and sent them  to the lab and Dr. Delila Spence reported that the contact examination showed no  evidence of tumor.  I then completed skin flaps and reflected the breast off  the  chest wall from superior to inferior and from medial to lateral taking  care to remove all of the pectoral fascia and some of the pectoralis major  muscle immediately under the tumor.  That completed the breast excision  laterally taking a generous amount of tissue toward the axilla encompassing  the axillary tail of the breast but not dissecting the axilla.  I oriented  the specimen with suture and sent it for examination.  I then got good  hemostasis with cautery, irrigated the wound to remove loose pieces of fat  and placed two flat suction drains, one draining the dependent area  laterally  and one draining the anterior flap.  These were secured to the skin with 2-0  silk and brought out through separate stab incisions.  I closed the skin  with staples.  The drains held good suction.  The patient tolerated the  procedure well.      Lebron Conners, M.D.  Electronically Signed     WB/MEDQ  D:  11/15/2004  T:  11/15/2004  Job:  [id number]   cc:   Corwin Levins, M.D. [facility]  [address]  [place]  [place] [address]   Genene Churn. Cyndie Chime, M.D.  Fax: [phone]

## 2010-06-16 NOTE — H&P (Signed)
[facility]  Patient:    Rose Sullivan, Rose Sullivan Visit Number: [id number] MRN: [id number]          Service Type: MED Location: 3W [phone] Attending Physician:  Corwin Levins Dictated by:   Corwin Levins, M.D. [facility] Admit Date:  06/30/2001                           History and Physical  DATE OF BIRTH:  [birth year]  CHIEF COMPLAINT:  Three to four days of increasing severity of nausea, vomiting, and diffuse, question worse, right-sided abdominal pain with low-grade temperature.  There are no GU symptoms, no hematemesis, though she has had multiple episodes of vomiting, especially up all last night.  She has had no bowel movement for three days.  PAST MEDICAL HISTORY: 1. GERD. 2. Hypertension. 3. Glucose intolerance. 4. Hypercholesterolemia. 5. Morbid obesity. 6. Legally blind.  PAST SURGICAL HISTORY:  Appendectomy.  MEDICATIONS: 1. Cardizem CD 240 mg p.o. q.d. 2. Actonel 35 mg p.o. q.d. 3. Nexium 40 mg p.o. q.d. 4. Celebrex 20 mg b.i.d. p.r.n., for which she is really not taking on a    regular basis.  SOCIAL HISTORY:  She lives with one of her daughters.  No tobacco, no alcohol use.  FAMILY HISTORY:  Significant for hypertension.  REVIEW OF SYSTEMS:  She did have some vaginal bleeding two weeks ago, saw gynecologist, lots of tests, benign biopsy.  Is due for follow-up.  No medication treatment for her problem.  She has had no bowel movement for three days.  She has had some urinary incontinence with vomiting, otherwise no GU symptoms.  PHYSICAL EXAMINATION:  GENERAL:  The patient is a 77 year old white female.  She appears at least moderately ill, questionably early toxic.  VITAL SIGNS:  Blood pressure 100/60, respirations 20, pulse 96, temperature 99.3.  Weight 212.  HEENT:  Sclerae clear.  TMs clear.  Pharynx benign.  NECK:  Without lymphadenopathy, JVD, thyromegaly.  CHEST:  No rales or wheezing.  CARDIAC:  Regular rate and  rhythm.  No murmur.  ABDOMEN:  Diffusely tender.  Somewhat decreased bowel sounds.  Questionably worse tenderness right lower quadrant.  There is no guarding or rebound.  No organomegaly, no masses.  EXTREMITIES:  No edema.  ASSESSMENT AND PLAN: 1. Nausea and vomiting, with abdominal pain and low-grade temperature:    Questionable gastroenteritis versus obstruction or ileus.  She initially    refused hospitalization but changed her mind.  She is to be admitted.  Will    check abdominal and pelvic CT.  IV fluids.  Routine laboratories, cultures.    Empiric Unasyn IV.  Further treatment pending outcome of testing.  She was    also given antiemetics. 2. Glucose tolerance:  Check CBGs.  Sliding scale insulin. 3. Relative hypotension:  Will hold the Cardizem CD.  Otherwise, continue all    other medications except for the Celebrex, which she is not really taking. Dictated by:   Corwin Levins, M.D. [facility] Attending Physician:  Corwin Levins DD:  06/30/01 TD:  07/01/01 Job: [id number]

## 2010-06-16 NOTE — Op Note (Signed)
[facility]  Patient:    Rose Sullivan, Rose Sullivan Visit Number: [id number] MRN: [id number]          Service Type: DSU Location: [facility] Attending Physician:  Wandalee Ferdinand Dictated by:   Rudy Jew Ashley Royalty, M.D. Proc. Date: 08/21/01 Admit Date:  08/21/2001 Discharge Date: 08/21/2001                             Operative Report  PREOPERATIVE DIAGNOSES:       Endometrial versus endocervical polyp.  POSTOPERATIVE DIAGNOSES:      Endometrial versus endocervical polyp, pathology pending.  PROCEDURE:                    1. Diagnostic/operative hysteroscopy.                               2. Dilatation and curettage.  SURGEON:                      Rudy Jew. Ashley Royalty, M.D.  ANESTHESIA:                   Monitored anesthesia care with 1% Xylocaine paracervical block (12 cc).  ESTIMATED BLOOD LOSS:         Less than 25 cc.  COMPLICATIONS:                None.  PACKS AND DRAINS:             None.  PROCEDURE:                    The patient was taken to the operating room and placed in the dorsal supine position.  After adequate IV sedation was administered, she was placed in the lithotomy position and prepped and draped in the usual manner for vaginal surgery.  Posterior weighted retractor was placed per vagina.  The anterior lip of the cervix was grasped with a single tooth tenaculum.  The uterus was gently sounded to approximately 9 cm and noted to be anteverted.  The cervix was then serially dilated with Shawnie Pons dilators to a size 29 [place].  The resectoscope was placed into the uterine cavity using sorbitol as a distention medium.  The uterine cavity was inspected in its entirety.  The fundal polyp was noted.  There was also a small polyp noted near the right cornu.  Appropriate photos were obtained. The tubal ostia were easily seen and documented bilaterally.  Using the resectoscope with 70 watts cutting wave form the aforementioned polyps  were excised and submitted separately to pathology for histologic studies. Hemostasis was obtained with the coagulation wave form at 70 watts power.  Attention was then turned to the uterine curettage.  A medium sized curette was introduced into the uterine cavity.  A four quadrant diagnostic technique was initially used.  Then a therapeutic technique was used.  The uterine curettings were submitted separately to pathology for histologic studies.  The resectoscope was reintroduced and hemostasis noted.  The procedure was then terminated.  Vaginal instruments were removed and the patient taken to the recovery room in excellent condition. Dictated by:   Rudy Jew Ashley Royalty, M.D. Attending Physician:  Wandalee Ferdinand DD:  08/21/01 TD:  08/25/01 Job: [id number]

## 2010-06-28 ENCOUNTER — Ambulatory Visit: Payer: Medicare Other | Admitting: *Deleted

## 2010-07-04 ENCOUNTER — Encounter: Payer: Medicare Other | Admitting: *Deleted

## 2010-07-07 ENCOUNTER — Encounter: Payer: Medicare Other | Attending: Internal Medicine | Admitting: *Deleted

## 2010-07-07 ENCOUNTER — Ambulatory Visit: Payer: Medicare Other | Admitting: *Deleted

## 2010-07-07 DIAGNOSIS — Z713 Dietary counseling and surveillance: Secondary | ICD-10-CM | POA: Insufficient documentation

## 2010-07-07 DIAGNOSIS — R7309 Other abnormal glucose: Secondary | ICD-10-CM | POA: Insufficient documentation

## 2010-08-10 LAB — PROTIME-INR

## 2010-08-24 ENCOUNTER — Other Ambulatory Visit: Payer: Self-pay | Admitting: Oncology

## 2010-08-24 ENCOUNTER — Encounter (HOSPITAL_BASED_OUTPATIENT_CLINIC_OR_DEPARTMENT_OTHER): Payer: Medicare Other | Admitting: Oncology

## 2010-08-24 DIAGNOSIS — C801 Malignant (primary) neoplasm, unspecified: Secondary | ICD-10-CM

## 2010-08-24 DIAGNOSIS — C50319 Malignant neoplasm of lower-inner quadrant of unspecified female breast: Secondary | ICD-10-CM

## 2010-08-24 DIAGNOSIS — M899 Disorder of bone, unspecified: Secondary | ICD-10-CM

## 2010-08-24 DIAGNOSIS — C7800 Secondary malignant neoplasm of unspecified lung: Secondary | ICD-10-CM

## 2010-08-24 DIAGNOSIS — Z7901 Long term (current) use of anticoagulants: Secondary | ICD-10-CM

## 2010-08-24 LAB — CBC WITH DIFFERENTIAL/PLATELET
Basophils Absolute: 0 10*3/uL (ref 0.0–0.1)
Eosinophils Absolute: 0.5 10*3/uL (ref 0.0–0.5)
HGB: 14.3 g/dL (ref 11.6–15.9)
MCV: 92.2 fL (ref 79.5–101.0)
MONO#: 0.5 10*3/uL (ref 0.1–0.9)
NEUT#: 4.7 10*3/uL (ref 1.5–6.5)
RDW: 12.7 % (ref 11.2–14.5)
lymph#: 2.1 10*3/uL (ref 0.9–3.3)

## 2010-08-25 ENCOUNTER — Ambulatory Visit (INDEPENDENT_AMBULATORY_CARE_PROVIDER_SITE_OTHER): Payer: Self-pay

## 2010-08-25 DIAGNOSIS — R0989 Other specified symptoms and signs involving the circulatory and respiratory systems: Secondary | ICD-10-CM

## 2010-08-25 LAB — COMPREHENSIVE METABOLIC PANEL
ALT: 28 U/L (ref 0–35)
AST: 28 U/L (ref 0–37)
Albumin: 4.2 g/dL (ref 3.5–5.2)
Alkaline Phosphatase: 62 U/L (ref 39–117)
Potassium: 4.2 mEq/L (ref 3.5–5.3)
Sodium: 142 mEq/L (ref 135–145)
Total Bilirubin: 0.7 mg/dL (ref 0.3–1.2)
Total Protein: 7.2 g/dL (ref 6.0–8.3)

## 2010-08-26 ENCOUNTER — Other Ambulatory Visit: Payer: Self-pay | Admitting: Cardiology

## 2010-08-31 ENCOUNTER — Ambulatory Visit (INDEPENDENT_AMBULATORY_CARE_PROVIDER_SITE_OTHER): Payer: Medicare Other | Admitting: *Deleted

## 2010-08-31 ENCOUNTER — Encounter (HOSPITAL_BASED_OUTPATIENT_CLINIC_OR_DEPARTMENT_OTHER): Payer: Medicare Other | Admitting: Oncology

## 2010-08-31 ENCOUNTER — Ambulatory Visit (INDEPENDENT_AMBULATORY_CARE_PROVIDER_SITE_OTHER): Payer: Medicare Other | Admitting: Internal Medicine

## 2010-08-31 ENCOUNTER — Encounter: Payer: Self-pay | Admitting: Internal Medicine

## 2010-08-31 VITALS — BP 110/80 | HR 72 | Temp 98.3°F | Ht 60.0 in | Wt 218.0 lb

## 2010-08-31 DIAGNOSIS — Z7901 Long term (current) use of anticoagulants: Secondary | ICD-10-CM

## 2010-08-31 DIAGNOSIS — M545 Low back pain, unspecified: Secondary | ICD-10-CM

## 2010-08-31 DIAGNOSIS — M949 Disorder of cartilage, unspecified: Secondary | ICD-10-CM

## 2010-08-31 DIAGNOSIS — R269 Unspecified abnormalities of gait and mobility: Secondary | ICD-10-CM

## 2010-08-31 DIAGNOSIS — E559 Vitamin D deficiency, unspecified: Secondary | ICD-10-CM | POA: Insufficient documentation

## 2010-08-31 DIAGNOSIS — M25562 Pain in left knee: Secondary | ICD-10-CM

## 2010-08-31 DIAGNOSIS — I10 Essential (primary) hypertension: Secondary | ICD-10-CM

## 2010-08-31 DIAGNOSIS — M899 Disorder of bone, unspecified: Secondary | ICD-10-CM

## 2010-08-31 DIAGNOSIS — C7800 Secondary malignant neoplasm of unspecified lung: Secondary | ICD-10-CM

## 2010-08-31 DIAGNOSIS — C50319 Malignant neoplasm of lower-inner quadrant of unspecified female breast: Secondary | ICD-10-CM

## 2010-08-31 DIAGNOSIS — C801 Malignant (primary) neoplasm, unspecified: Secondary | ICD-10-CM

## 2010-08-31 DIAGNOSIS — I4891 Unspecified atrial fibrillation: Secondary | ICD-10-CM

## 2010-08-31 DIAGNOSIS — M25569 Pain in unspecified knee: Secondary | ICD-10-CM

## 2010-08-31 DIAGNOSIS — M25561 Pain in right knee: Secondary | ICD-10-CM | POA: Insufficient documentation

## 2010-08-31 MED ORDER — PREDNISONE 10 MG PO TABS
ORAL_TABLET | ORAL | Status: DC
Start: 2010-08-31 — End: 2011-01-21

## 2010-08-31 MED ORDER — TRAMADOL HCL 50 MG PO TABS: 50.0000 mg | ORAL_TABLET | Freq: Four times a day (QID) | ORAL | Status: AC | PRN

## 2010-08-31 NOTE — Assessment & Plan Note (Addendum)
Mild worsening , for [facility] to do PT at home,  to f/u any worsening symptoms or concerns

## 2010-08-31 NOTE — Assessment & Plan Note (Addendum)
And bilat knee djd - to try ultram prn, f/u next visit, also predpack trial for mild acute flare

## 2010-08-31 NOTE — Patient Instructions (Signed)
Take all new medications as prescribed Stop the tylenol #3 please Continue all other medications as before You will be contacted regarding the referral for: Physical Therapy through [facility] Please start Vit D 2000 units per day Please return in 3 months

## 2010-09-05 ENCOUNTER — Encounter: Payer: Self-pay | Admitting: Cardiology

## 2010-09-05 ENCOUNTER — Telehealth: Payer: Self-pay

## 2010-09-05 MED ORDER — TEMAZEPAM 15 MG PO CAPS: 15.0000 mg | ORAL_CAPSULE | Freq: Every evening | ORAL | Status: DC | PRN

## 2010-09-05 NOTE — Telephone Encounter (Signed)
Done hardcopy to dahlia/LIM B  

## 2010-09-05 NOTE — Telephone Encounter (Signed)
Pt's daughter called stating that she discussed with either JWJ or his CMA that her mother was previously taking temazepam 15 mg po qhs. She is requesting Rx for pt, please advise.

## 2010-09-06 NOTE — Telephone Encounter (Signed)
Called patients daughter to inform hardcopy or prescription has been sent as requested to [facility]. At [phone].

## 2010-09-10 ENCOUNTER — Encounter: Payer: Self-pay | Admitting: Internal Medicine

## 2010-09-10 NOTE — Progress Notes (Signed)
Subjective:    Patient ID: Rose Sullivan, female    DOB: 07-20-1933, 77 y.o.   MRN: [id number]  HPI Here to f/u;  Was ostensibly here for "hypersomnia" but actually daughter and pt state pt has insomnia nearly nightly, did well with a certain medication in the past a few yrs ago but cannot recall the name.  Denies worsening depressive symptoms, suicidal ideation, or panic, though has ongoing anxiety, not increased recently.   Has ongoing bilat knee pain, chronic with gait difficulty with increased general weakness and debility recently, especially to quads, hard to stand from sitting.  Pt continues to have recurring LBP with recent mild change in severity, but no bowel or bladder change, fever, wt loss,  worsening LE pain/numbness/weakness, gait change or falls. Tylenol #3 not effective. Has known vit D deficiency and asks for recommendation on OTC dosage.  Pt denies chest pain, increased sob or doe, wheezing, orthopnea, PND, increased LE swelling, palpitations, dizziness or syncope.  Pt denies new neurological symptoms such as new headache, or facial or extremity weakness or numbness except for the above.   Pt denies fever, wt loss, night sweats, loss of appetite, or other constitutional symptoms Past Medical History  Diagnosis Date  . GLUCOSE INTOLERANCE 02/11/2007  . HYPERLIPIDEMIA 02/11/2007  . 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 Other specified forms of hearing loss 09/05/2009  . HYPERTENSION 02/11/2007  . Atrial fibrillation 03/07/2009  . GERD 02/11/2007  . LOW BACK PAIN, CHRONIC 11/25/2006  . BACK PAIN 12/10/2007  . Pain in Soft Tissues of Limb 02/11/2007  . OSTEOPENIA 02/11/2007  . FATIGUE 12/10/2007  . GAIT DISTURBANCE 12/10/2007  . DYSPNEA 03/18/2009  . Cough 03/07/2009  . CHEST PAIN 12/10/2007  . Abdominal pain, left lower quadrant 04/01/2007  . BREAST CANCER, HX OF 11/25/2006  . Nocturia 05/18/2010  . Hypersomnia 05/18/2010  . Impaired glucose tolerance 05/18/2010   Past  Surgical History  Procedure Date  . Dilation and curettage of uterus   . S/p mastectomy   . Appendectomy     reports that she has quit smoking. She does not have any smokeless tobacco history on file. She reports that she does not drink alcohol or use illicit drugs. family history includes COPD in her brother; Colon cancer in her father; Hypertension in her brother; Lung cancer in her brother; and Stroke in her mother. No Known Allergies Current Outpatient Prescriptions on File Prior to Visit  Medication Sig Dispense Refill  . cyclobenzaprine (FLEXERIL) 5 MG tablet Take 5 mg by mouth 3 (three) times daily as needed.        . diltiazem (CARDIZEM CD) 240 MG 24 hr capsule TAKE ONE CAPSULE BY MOUTH EVERY DAY  30 capsule  3  . diltiazem (DILACOR XR) 240 MG 24 hr capsule Take 240 mg by mouth daily.        Marland Kitchen exemestane (AROMASIN) 25 MG tablet Take 25 mg by mouth daily.        . furosemide (LASIX) 20 MG tablet Take 20 mg by mouth daily.        Marland Kitchen losartan (COZAAR) 50 MG tablet TAKE 1 TABLET BY MOUTH EVERY DAY  30 tablet  4  . omeprazole (PRILOSEC) 20 MG capsule TAKE ONE CAPSULE BY MOUTH EVERY DAY  30 capsule  4  . potassium chloride (K-DUR) 10 MEQ tablet Take 10 mEq by mouth daily.        Marland Kitchen warfarin (COUMADIN) 5 MG tablet USE AS DIRECTED  35  tablet  0   Review of Systems Review of Systems  Constitutional: Negative for diaphoresis and unexpected weight change.  HENT: Negative for drooling and tinnitus.   Eyes: Negative for photophobia and visual disturbance.  Respiratory: Negative for choking and stridor.   Gastrointestinal: Negative for vomiting and blood in stool.  Genitourinary: Negative for hematuria and decreased urine volume.      Objective:   Physical Exam BP 110/80  Pulse 72  Temp(Src) 98.3 F (36.8 C) (Oral)  Ht 5' (1.524 m)  Wt 218 lb (98.884 kg)  BMI 42.58 kg/m2  SpO2 96% Physical Exam  VS noted, not ill appearing Constitutional: Pt appears well-developed and well-nourished.   HENT: Head: Normocephalic.  Right Ear: External ear normal.  Left Ear: External ear normal.  Eyes: Conjunctivae and EOM are normal. Pupils are equal, round, and reactive to light.  Neck: Normal range of motion. Neck supple.  Cardiovascular: Normal rate and regular rhythm.   Pulmonary/Chest: Effort normal and breath sounds normal.  Abd:  Soft, NT, non-distended, + BS Neurological: Pt is alert. No cranial nerve deficit. motor/dtr intact; walks with walker with balance difficulty Skin: Skin is warm. No erythema.  Psychiatric: Pt behavior is normal. Thought content normal. 1+ nervous Bilat knee with chronic bony changes, no effusion, NT Spine nontender        Assessment & Plan:

## 2010-09-10 NOTE — Assessment & Plan Note (Signed)
stable overall by hx and exam, most recent data reviewed with pt, and pt to continue medical treatment as before  BP Readings from Last 3 Encounters:  08/31/10 110/80  06/06/10 128/90  05/18/10 122/80

## 2010-09-10 NOTE — Assessment & Plan Note (Signed)
For 2000 unit OTC qd

## 2010-09-10 NOTE — Assessment & Plan Note (Signed)
With gait diffictuly,  For home PT, consider ortho eval but pt decliens at this time

## 2010-09-14 ENCOUNTER — Telehealth: Payer: Self-pay | Admitting: *Deleted

## 2010-09-14 ENCOUNTER — Encounter: Payer: Medicare Other | Admitting: *Deleted

## 2010-09-14 NOTE — Telephone Encounter (Signed)
noted 

## 2010-09-14 NOTE — Telephone Encounter (Signed)
Jacki Cones from [facility] called and states that pt's daughter has deferred visits for PT until Monday August 20th.

## 2010-09-20 ENCOUNTER — Telehealth: Payer: Self-pay | Admitting: *Deleted

## 2010-09-20 NOTE — Telephone Encounter (Signed)
PT informed

## 2010-09-20 NOTE — Telephone Encounter (Signed)
Ok for verbal 

## 2010-09-20 NOTE — Telephone Encounter (Signed)
Physical therapist did eval on 8/21. She is req verbal OK from office to start services. OK?

## 2010-10-05 ENCOUNTER — Ambulatory Visit: Payer: Medicare Other

## 2010-10-06 DIAGNOSIS — IMO0001 Reserved for inherently not codable concepts without codable children: Secondary | ICD-10-CM

## 2010-10-06 DIAGNOSIS — H548 Legal blindness, as defined in USA: Secondary | ICD-10-CM

## 2010-10-06 DIAGNOSIS — I4891 Unspecified atrial fibrillation: Secondary | ICD-10-CM

## 2010-10-06 DIAGNOSIS — R269 Unspecified abnormalities of gait and mobility: Secondary | ICD-10-CM

## 2010-10-19 ENCOUNTER — Telehealth: Payer: Self-pay

## 2010-10-19 NOTE — Telephone Encounter (Signed)
Called informed verbal ok

## 2010-10-19 NOTE — Telephone Encounter (Signed)
Genevieve Norlander called to inform the patients PT ends on 10/20/10.  Genevieve Norlander PT Stark Klein would like an extension to start 10/21/2010 for 3 times per week for 3 more weeks. The patient has been a challenge, is improving, but still needs PT. A verbal ok is all they need. Please advise.

## 2010-10-19 NOTE — Telephone Encounter (Signed)
Ok for verbal 

## 2010-10-21 ENCOUNTER — Other Ambulatory Visit: Payer: Self-pay | Admitting: Cardiology

## 2010-10-26 ENCOUNTER — Encounter: Payer: Medicare Other | Admitting: *Deleted

## 2010-10-27 ENCOUNTER — Ambulatory Visit (INDEPENDENT_AMBULATORY_CARE_PROVIDER_SITE_OTHER): Payer: Medicare Other | Admitting: *Deleted

## 2010-10-27 DIAGNOSIS — I4891 Unspecified atrial fibrillation: Secondary | ICD-10-CM

## 2010-10-27 LAB — POCT INR: INR: 3.1

## 2010-11-03 ENCOUNTER — Telehealth: Payer: Self-pay | Admitting: *Deleted

## 2010-11-03 MED ORDER — HYDROCODONE-ACETAMINOPHEN 5-325 MG PO TABS: 1.0000 | ORAL_TABLET | Freq: Four times a day (QID) | ORAL | Status: DC | PRN

## 2010-11-03 NOTE — Telephone Encounter (Signed)
Done to robin 

## 2010-11-03 NOTE — Telephone Encounter (Signed)
Let msg on vm stating mom is in constantly pain. Tramadol did not help. Starting using tylenol #3 but med is not working. Requesting md to rx stronger pain meds....11/03/10@2 :17pm/LMB

## 2010-11-06 ENCOUNTER — Telehealth: Payer: Self-pay

## 2010-11-06 NOTE — Telephone Encounter (Signed)
rx done hardcopy to robin 

## 2010-11-06 NOTE — Telephone Encounter (Signed)
Faxed [facility] to [facility] [place]. Patients daughter called back and informed

## 2010-11-06 NOTE — Telephone Encounter (Signed)
Genevieve Norlander called [facility] requesting a faxed order for a wheelchair. Phone [phone] and Fax# [phone]

## 2010-11-06 NOTE — Telephone Encounter (Signed)
Faxed rx to [facility] at [phone] and called to inform Genevieve Norlander rx has been faxed.

## 2010-11-06 NOTE — Telephone Encounter (Signed)
Call the patient left message to call back

## 2010-11-17 ENCOUNTER — Ambulatory Visit: Payer: Medicare Other | Admitting: Internal Medicine

## 2010-11-24 ENCOUNTER — Encounter: Payer: Medicare Other | Admitting: *Deleted

## 2010-11-24 ENCOUNTER — Other Ambulatory Visit: Payer: Self-pay | Admitting: Internal Medicine

## 2010-12-02 ENCOUNTER — Other Ambulatory Visit: Payer: Self-pay | Admitting: Cardiology

## 2010-12-12 ENCOUNTER — Other Ambulatory Visit: Payer: Self-pay | Admitting: Internal Medicine

## 2010-12-19 ENCOUNTER — Telehealth: Payer: Self-pay | Admitting: Student

## 2010-12-19 MED ORDER — TEMAZEPAM 30 MG PO CAPS: 30.0000 mg | ORAL_CAPSULE | Freq: Every evening | ORAL | Status: DC | PRN

## 2010-12-19 MED ORDER — HYDROCODONE-ACETAMINOPHEN 5-325 MG PO TABS: 1.0000 | ORAL_TABLET | Freq: Four times a day (QID) | ORAL | Status: DC | PRN

## 2010-12-19 NOTE — Telephone Encounter (Signed)
Done hardcopy to robin  

## 2010-12-19 NOTE — Telephone Encounter (Signed)
Rose Sullivan called for her mother Rose Sullivan needing her Hydrocodone and Temazepam refilled.

## 2010-12-19 NOTE — Telephone Encounter (Signed)
Called left message prescription requested has been faxed to pharmacy.

## 2010-12-19 NOTE — Telephone Encounter (Signed)
Addended by: Corwin Levins on: 12/19/2010 01:15 PM   Modules accepted: Orders

## 2011-01-05 ENCOUNTER — Other Ambulatory Visit: Payer: Self-pay | Admitting: Cardiology

## 2011-01-05 NOTE — Telephone Encounter (Signed)
Called and spoke with patients daughter [name], informed her we could not refill coumadin until we checked her INR,  she has recently built a ramp for her mother to leave home, she is going to make appointment for her mother to see Dr Shirlee Latch and coordinate a coumadin check with that appointment. According to dtr Angie she does have 1 refill left.

## 2011-01-09 ENCOUNTER — Ambulatory Visit (INDEPENDENT_AMBULATORY_CARE_PROVIDER_SITE_OTHER): Payer: Medicare Other | Admitting: Internal Medicine

## 2011-01-09 ENCOUNTER — Encounter: Payer: Self-pay | Admitting: Internal Medicine

## 2011-01-09 DIAGNOSIS — R7309 Other abnormal glucose: Secondary | ICD-10-CM

## 2011-01-09 DIAGNOSIS — M5416 Radiculopathy, lumbar region: Secondary | ICD-10-CM

## 2011-01-09 DIAGNOSIS — R7302 Impaired glucose tolerance (oral): Secondary | ICD-10-CM

## 2011-01-09 DIAGNOSIS — I10 Essential (primary) hypertension: Secondary | ICD-10-CM

## 2011-01-09 DIAGNOSIS — M5412 Radiculopathy, cervical region: Secondary | ICD-10-CM

## 2011-01-09 DIAGNOSIS — IMO0002 Reserved for concepts with insufficient information to code with codable children: Secondary | ICD-10-CM

## 2011-01-09 MED ORDER — GABAPENTIN 100 MG PO CAPS: 100.0000 mg | ORAL_CAPSULE | Freq: Three times a day (TID) | ORAL | Status: DC

## 2011-01-09 MED ORDER — HYDROCODONE-ACETAMINOPHEN 10-325 MG PO TABS: 1.0000 | ORAL_TABLET | Freq: Four times a day (QID) | ORAL | Status: AC | PRN

## 2011-01-09 NOTE — Patient Instructions (Signed)
Take all new medications as prescribed Continue all other medications as before You will be contacted regarding the referral for: MRI for the neck and lower back Please [place] know the name of the orthopedic if you wish to be referred (or you can for appt on your own)

## 2011-01-10 ENCOUNTER — Ambulatory Visit: Payer: Medicare Other | Admitting: Internal Medicine

## 2011-01-11 ENCOUNTER — Encounter: Payer: Medicare Other | Admitting: *Deleted

## 2011-01-13 ENCOUNTER — Telehealth: Payer: Self-pay | Admitting: Oncology

## 2011-01-13 NOTE — Telephone Encounter (Signed)
called pts home and the phone was not in service. will mail [id number] appts to her home along with chest xray on 03/01/2011 @ [facility].

## 2011-01-18 ENCOUNTER — Ambulatory Visit
Admission: RE | Admit: 2011-01-18 | Discharge: 2011-01-18 | Disposition: A | Discharge status: Home / Self Care | Payer: Medicare Other | Source: Ambulatory Visit | Attending: Internal Medicine | Admitting: Internal Medicine

## 2011-01-18 DIAGNOSIS — M5416 Radiculopathy, lumbar region: Secondary | ICD-10-CM

## 2011-01-18 DIAGNOSIS — M5412 Radiculopathy, cervical region: Secondary | ICD-10-CM

## 2011-01-21 ENCOUNTER — Encounter: Payer: Self-pay | Admitting: Internal Medicine

## 2011-01-21 DIAGNOSIS — Z0000 Encounter for general adult medical examination without abnormal findings: Secondary | ICD-10-CM | POA: Insufficient documentation

## 2011-01-21 NOTE — Assessment & Plan Note (Signed)
Moderate to severe, for c-spine MRI, refer ortho at pt discretion, for impoved pain control

## 2011-01-21 NOTE — Assessment & Plan Note (Signed)
stable overall by hx and exam, most recent data reviewed with pt, and pt to continue medical treatment as before, to call for worsening polys or cbgs' > 200

## 2011-01-21 NOTE — Assessment & Plan Note (Signed)
stable overall by hx and exam, most recent data reviewed with pt, and pt to continue medical treatment as before  BP Readings from Last 3 Encounters:  01/09/11 118/64  08/31/10 110/80  06/06/10 128/90

## 2011-01-21 NOTE — Assessment & Plan Note (Signed)
Mod to severe, for increased pain med, MRI LS spine, consider ortho refferall though family wants to consider who they would want to see

## 2011-01-21 NOTE — Progress Notes (Signed)
Subjective:    Patient ID: Rose Sullivan, female    DOB: 1933-04-06, 77 y.o.   MRN: [id number]  HPI  Here with family, pt in wheelchair, cannot get up to exam table due to pain, c/o 4-6 wks worsening both neck and lower back pain mod to severe with radiation to both arms and legs; worse after fall 1 wk ago; has general weakness due to pain apparently, not clear about nunbness or weakness specific;   Pt denies fever, wt loss, night sweats, loss of appetite, or other constitutional symptoms  Pt denies chest pain, increased sob or doe, wheezing, orthopnea, PND, increased LE swelling, palpitations, dizziness or syncope.   Pt denies polydipsia, polyuria. Overall good compliance with treatment, and good medicine tolerability. Past Medical History  Diagnosis Date  . GLUCOSE INTOLERANCE 02/11/2007  . HYPERLIPIDEMIA 02/11/2007  . 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 Other specified forms of hearing loss 09/05/2009  . HYPERTENSION 02/11/2007  . Atrial fibrillation 03/07/2009  . GERD 02/11/2007  . LOW BACK PAIN, CHRONIC 11/25/2006  . BACK PAIN 12/10/2007  . Pain in Soft Tissues of Limb 02/11/2007  . OSTEOPENIA 02/11/2007  . FATIGUE 12/10/2007  . GAIT DISTURBANCE 12/10/2007  . DYSPNEA 03/18/2009  . Cough 03/07/2009  . CHEST PAIN 12/10/2007  . Abdominal pain, left lower quadrant 04/01/2007  . BREAST CANCER, HX OF 11/25/2006  . Nocturia 05/18/2010  . Hypersomnia 05/18/2010  . Impaired glucose tolerance 05/18/2010   Past Surgical History  Procedure Date  . Dilation and curettage of uterus   . S/p mastectomy   . Appendectomy     reports that she has quit smoking. She does not have any smokeless tobacco history on file. She reports that she does not drink alcohol or use illicit drugs. family history includes COPD in her brother; Colon cancer in her father; Hypertension in her brother; Lung cancer in her brother; and Stroke in her mother. No Known Allergies Current Outpatient Prescriptions on File  Prior to Visit  Medication Sig Dispense Refill  . cyclobenzaprine (FLEXERIL) 5 MG tablet Take 5 mg by mouth 3 (three) times daily as needed.        . diltiazem (CARDIZEM CD) 240 MG 24 hr capsule TAKE ONE CAPSULE BY MOUTH EVERY DAY  30 capsule  10  . diltiazem (DILACOR XR) 240 MG 24 hr capsule Take 240 mg by mouth daily.        Marland Kitchen exemestane (AROMASIN) 25 MG tablet Take 25 mg by mouth daily.        . furosemide (LASIX) 20 MG tablet TAKE 1 TABLET BY MOUTH EVERY DAY  30 tablet  3  . KLOR-CON 10 10 MEQ CR tablet TAKE 1 TABLET BY MOUTH EVERY DAY  30 tablet  3  . losartan (COZAAR) 50 MG tablet TAKE 1 TABLET BY MOUTH EVERY DAY  30 tablet  11  . omeprazole (PRILOSEC) 20 MG capsule TAKE ONE CAPSULE BY MOUTH EVERY DAY  30 capsule  11  . warfarin (COUMADIN) 5 MG tablet USE AS DIRECTED  35 tablet  1  . predniSONE (DELTASONE) 10 MG tablet 3 tabs by mouth per day for 3 days, 2 tabs per day for 3 days, 1 tab per day for 3 days   18 tablet  0   Review of Systems Review of Systems  Constitutional: Negative for diaphoresis and unexpected weight change.  HENT: Negative for drooling and tinnitus.   Eyes: Negative for photophobia and visual disturbance.  Respiratory: Negative for  choking and stridor.   Gastrointestinal: Negative for vomiting and blood in stool.  Genitourinary: Negative for hematuria and decreased urine volume.  Objective:   Physical Exam BP 118/64  Pulse 82  Temp(Src) 97.9 F (36.6 C) (Oral)  Ht 5' (1.524 m)  Wt 225 lb (102.059 kg)  BMI 43.94 kg/m2  SpO2 97% Physical Exam  VS noted Constitutional: Pt appears well-developed and well-nourished.  HENT: Head: Normocephalic.  Right Ear: External ear normal.  Left Ear: External ear normal.  Eyes: Conjunctivae and EOM are normal. Pupils are equal, round, and reactive to light.  Neck: Normal range of motion. Neck supple.  Cardiovascular: Normal rate and regular rhythm.   Pulmonary/Chest: Effort normal and breath sounds normal.  Abd:   Soft, NT, non-distended, + BS Neurological: Pt is alert. No cranial nerve deficit. spine tender base of neck only without swelling, erythema, rash Skin: Skin is warm. No erythema.  Psychiatric: Pt behavior is normal. Thought content normal. mild tearful today    Assessment & Plan:

## 2011-02-08 ENCOUNTER — Encounter: Payer: Medicare Other | Admitting: *Deleted

## 2011-02-09 ENCOUNTER — Ambulatory Visit (INDEPENDENT_AMBULATORY_CARE_PROVIDER_SITE_OTHER): Payer: Medicare Other | Admitting: *Deleted

## 2011-02-09 DIAGNOSIS — I4891 Unspecified atrial fibrillation: Secondary | ICD-10-CM

## 2011-02-09 LAB — POCT INR: INR: 2.2

## 2011-02-09 MED ORDER — WARFARIN SODIUM 5 MG PO TABS: 5.0000 mg | ORAL_TABLET | Freq: Every day | ORAL | Status: DC

## 2011-02-14 ENCOUNTER — Telehealth: Payer: Self-pay

## 2011-02-14 NOTE — Telephone Encounter (Signed)
To robin to handle please

## 2011-02-14 NOTE — Telephone Encounter (Signed)
Faxed office notes to Dr. Ethelene Hal.

## 2011-02-14 NOTE — Telephone Encounter (Signed)
Patients daughter [name] called to inform the patient has appointment on Friday at 2:15 with Dr. Ethelene Hal at [facility] ortho. Notes they need to be faxed can be sent to [phone], please advise

## 2011-02-15 ENCOUNTER — Telehealth: Payer: Self-pay | Admitting: *Deleted

## 2011-02-15 ENCOUNTER — Other Ambulatory Visit: Payer: Self-pay | Admitting: *Deleted

## 2011-02-15 DIAGNOSIS — C50919 Malignant neoplasm of unspecified site of unspecified female breast: Secondary | ICD-10-CM

## 2011-02-15 NOTE — Telephone Encounter (Signed)
Rose Sullivan called asking what labs patient is scheduled to have drawn on 03-01-11.  Patient's daughter in [place] drives patient to her appointments would like to not have to drive so many times.  [place] office will draw these labs STAT and fax results to [facility] for patient's f/u on 03-08-11.  No record of labs found.  There is a 01-13-11 phone note from scheduler that appointments were mailed for cxr for 03-01-11.  Will ask providers for clarification of lab and image orders and if [place] can draw our requested labs for this patient.

## 2011-03-01 ENCOUNTER — Other Ambulatory Visit: Payer: Medicare Other | Admitting: Lab

## 2011-03-02 ENCOUNTER — Ambulatory Visit: Payer: Medicare Other | Admitting: Cardiology

## 2011-03-02 ENCOUNTER — Encounter: Payer: Medicare Other | Admitting: *Deleted

## 2011-03-07 ENCOUNTER — Telehealth: Payer: Self-pay | Admitting: *Deleted

## 2011-03-07 ENCOUNTER — Encounter: Payer: Medicare Other | Admitting: *Deleted

## 2011-03-07 ENCOUNTER — Ambulatory Visit: Payer: Medicare Other | Admitting: Cardiology

## 2011-03-07 ENCOUNTER — Other Ambulatory Visit: Payer: Medicare Other | Admitting: *Deleted

## 2011-03-07 NOTE — Telephone Encounter (Signed)
gave patient appointment for 03-2011 left voice message to inform the patient of the new date and time

## 2011-03-08 ENCOUNTER — Ambulatory Visit: Payer: Medicare Other | Admitting: Physician Assistant

## 2011-03-08 ENCOUNTER — Ambulatory Visit: Payer: Medicare Other | Admitting: Oncology

## 2011-03-14 ENCOUNTER — Other Ambulatory Visit: Payer: Self-pay | Admitting: Cardiology

## 2011-03-23 ENCOUNTER — Ambulatory Visit: Payer: Medicare Other | Admitting: Physician Assistant

## 2011-03-23 NOTE — Progress Notes (Signed)
[facility] today.  Letter mailed to patient requesting appt be rescheduled.

## 2011-03-30 ENCOUNTER — Ambulatory Visit (INDEPENDENT_AMBULATORY_CARE_PROVIDER_SITE_OTHER): Payer: Medicare Other | Admitting: Cardiology

## 2011-03-30 ENCOUNTER — Ambulatory Visit (INDEPENDENT_AMBULATORY_CARE_PROVIDER_SITE_OTHER): Payer: Medicare Other | Admitting: *Deleted

## 2011-03-30 ENCOUNTER — Encounter: Payer: Self-pay | Admitting: Cardiology

## 2011-03-30 VITALS — BP 136/84 | HR 85 | Ht 60.0 in | Wt 209.0 lb

## 2011-03-30 DIAGNOSIS — R0602 Shortness of breath: Secondary | ICD-10-CM

## 2011-03-30 DIAGNOSIS — I4891 Unspecified atrial fibrillation: Secondary | ICD-10-CM

## 2011-03-30 DIAGNOSIS — I10 Essential (primary) hypertension: Secondary | ICD-10-CM

## 2011-03-30 LAB — BASIC METABOLIC PANEL
Calcium: 9.2 mg/dL (ref 8.4–10.5)
GFR: 46.44 mL/min — ABNORMAL LOW (ref >60.00)
Glucose, Bld: 100 mg/dL — ABNORMAL HIGH (ref 70–99)
Sodium: 140 mEq/L (ref 135–145)

## 2011-03-30 LAB — BRAIN NATRIURETIC PEPTIDE: Pro B Natriuretic peptide (BNP): 22 pg/mL (ref 0.0–100.0)

## 2011-03-30 LAB — POCT INR: INR: 1.1

## 2011-03-30 NOTE — Patient Instructions (Signed)
Your physician recommends that you have lab work today--BMET/BNP 427.31  Your physician wants you to follow-up in: 1 year with Dr Shirlee Latch. (March 2014) .You will receive a reminder letter in the mail two months in advance. If you don't receive a letter, please call our office to schedule the follow-up appointment.

## 2011-04-01 NOTE — Assessment & Plan Note (Signed)
Suspect that this is multifactorial, with obesity/deconditioning playing a large role.  There may be some diastolic CHF as well.  She will continue to take Lasix at current dose.  She needs to try to be more active (though her daughter says she is very unmotivated).  I again suggested that she try walking down the driveway several times a day.  I will check a BMET and BNP today.

## 2011-04-01 NOTE — Progress Notes (Signed)
PCP:  Dr. Oliver Barre  78 yo with obesity, deconditioning, and atrial fibrillation presents for followup.  She was admitted in 1/11 with rapid heart rate and dyspnea and found to be in atrial fib with RVR.  She converted spontaneously back to NSR.  CTA chest was negative for PE.  She had a UTI at the time which may have been the trigger for the episode.    At baseline, she has dyspnea with exertion. She is chronically short of breath walking around her house.  ? orthopnea => she sleeps on 2 pillows.  No chest pain.   She uses a wheelchair when she leaves the house.  She is quite inactive. Her inactivity seems to be primarily due to knee pain and hip pain from arthritis.  She does occasionally walk to the end of the driveway and back.  Weight has actually gone down about 20 lbs since I last saw her.  No falls.  Rare mild palpitations.    Labs (1/11): K 3.5, creatinine 0.98 Labs (3/11): K 3.9, creatinine 1.1, BNP 22 Labs (11/11): K 4.6, creatinine 1.23 Labs (4/12): K 4, creatinine 1.48, ALT 42, AST 42 Labs (7/12): K 4.2, creatinine 1.26  ECG: NSR, normal  Allergies (verified):  No Known Drug Allergies  Past Medical History: 1. Breast cancer (remote) s/p R mastectomy. 2. Hypertension: ACEI cough 3. Hyperlipidemia 4. Atrial flutter s/p ablation 5. Recurrent svt/narrow complex 6. Diet-controlled diabetes. 7. GERD 8. legally blind 9. Osteopenia 10. lumbar disc disease 11. Atrial fibrillation: Paroxysmal.  Echo (1/11) showed EF 60-65%, grade I diastolic dysfunction, PASP 34 mmHg.  12. Lexiscan myoview (2/11): EF 74%, no ischemia or infarction.  13. Obesity 14. Chronic dyspnea 15. Osteoarthritis with chronic knee and hip pain  Family History: Family History Hypertension - brother Family History Lung cancer - brother Family History Other cancer-Colon - father Family History of Respiratory disease - brother COPD Family History Stroke - mother  Social History: Lives with daughter Quit  smoking > 40 years ago Alcohol use-no Drug use-no  Current Outpatient Prescriptions  Medication Sig Dispense Refill  . cyclobenzaprine (FLEXERIL) 5 MG tablet Take 5 mg by mouth 3 (three) times daily as needed.        . diltiazem (CARDIZEM CD) 240 MG 24 hr capsule TAKE ONE CAPSULE BY MOUTH EVERY DAY  30 capsule  10  . diltiazem (DILACOR XR) 240 MG 24 hr capsule Take 240 mg by mouth daily.        Marland Kitchen exemestane (AROMASIN) 25 MG tablet Take 25 mg by mouth daily.        . furosemide (LASIX) 20 MG tablet TAKE 1 TABLET BY MOUTH EVERY DAY  30 tablet  3  . gabapentin (NEURONTIN) 100 MG capsule Take 1 capsule (100 mg total) by mouth 3 (three) times daily.  90 capsule  2  . HYDROcodone-acetaminophen (NORCO) 10-325 MG per tablet Take 1 tablet by mouth Daily. PRN      . KLOR-CON 10 10 MEQ CR tablet TAKE 1 TABLET BY MOUTH EVERY DAY  30 tablet  3  . losartan (COZAAR) 50 MG tablet TAKE 1 TABLET BY MOUTH EVERY DAY  30 tablet  11  . omeprazole (PRILOSEC) 20 MG capsule TAKE ONE CAPSULE BY MOUTH EVERY DAY  30 capsule  11  . temazepam (RESTORIL) 30 MG capsule Take 1 tablet by mouth Daily.      Marland Kitchen warfarin (COUMADIN) 5 MG tablet Take 1 tablet (5 mg total) by mouth daily. Take  coumadin as directed by coumadin clinic  40 tablet  0  . warfarin (COUMADIN) 5 MG tablet USE AS DIRECTED  35 tablet  0   BP 136/84  Pulse 85  Ht 5' (1.524 m)  Wt 209 lb (94.802 kg)  BMI 40.82 kg/m2 General:  Well developed, well nourished, in no acute distress.  Obese.  Neck:  Neck supple, JVP not elevated. No masses, thyromegaly or abnormal cervical nodes. Lungs:  Crackles at bases bilaterally Heart:  Non-displaced PMI, chest non-tender; regular rate and rhythm, S1, S2 without murmurs, rubs or gallops. Carotid upstroke normal, no bruit.  Difficult to palpate pedal pulses. Trace ankle edema, no varicosities. Abdomen:  Bowel sounds positive; abdomen soft and non-tender without masses, organomegaly, or hernias noted. No  hepatosplenomegaly. Extremities:  No clubbing or cyanosis. Neurologic:  Alert and oriented x 3. Psych:  Flat affect

## 2011-04-01 NOTE — Assessment & Plan Note (Signed)
No definite recurrence of atrial fibrillation.  Continue diltiazem CD.  Will continue warfarin for now as she has had no recent falls.

## 2011-04-01 NOTE — Assessment & Plan Note (Signed)
BP is under control. ° °

## 2011-04-04 ENCOUNTER — Ambulatory Visit: Payer: Medicare Other | Admitting: Physician Assistant

## 2011-04-04 NOTE — Progress Notes (Signed)
[facility] today.  Letter mailed to patient.  

## 2011-04-09 ENCOUNTER — Other Ambulatory Visit: Payer: Self-pay

## 2011-04-09 MED ORDER — TEMAZEPAM 30 MG PO CAPS: 30.0000 mg | ORAL_CAPSULE | Freq: Every evening | ORAL | Status: DC | PRN

## 2011-04-09 NOTE — Telephone Encounter (Signed)
Done hardcopy to robin  

## 2011-04-09 NOTE — Telephone Encounter (Signed)
Faxed hardcopy to pharmacy. 

## 2011-04-16 ENCOUNTER — Other Ambulatory Visit: Payer: Self-pay | Admitting: Cardiology

## 2011-04-20 ENCOUNTER — Ambulatory Visit (INDEPENDENT_AMBULATORY_CARE_PROVIDER_SITE_OTHER): Payer: Medicare Other | Admitting: Pharmacist

## 2011-04-20 DIAGNOSIS — I4891 Unspecified atrial fibrillation: Secondary | ICD-10-CM

## 2011-04-20 LAB — POCT INR: INR: 1.7

## 2011-04-20 MED ORDER — WARFARIN SODIUM 5 MG PO TABS: ORAL_TABLET | ORAL | Status: DC

## 2011-05-03 ENCOUNTER — Other Ambulatory Visit: Payer: Self-pay

## 2011-05-03 MED ORDER — GLUCOSE BLOOD VI STRP: ORAL_STRIP | Status: DC

## 2011-05-03 MED ORDER — ONETOUCH ULTRASOFT LANCETS MISC: Status: DC

## 2011-05-04 ENCOUNTER — Ambulatory Visit (INDEPENDENT_AMBULATORY_CARE_PROVIDER_SITE_OTHER): Payer: Medicare Other

## 2011-05-04 DIAGNOSIS — I4891 Unspecified atrial fibrillation: Secondary | ICD-10-CM

## 2011-05-04 LAB — POCT INR: INR: 1.5

## 2011-05-07 ENCOUNTER — Other Ambulatory Visit: Payer: Self-pay

## 2011-05-07 MED ORDER — HYDROCODONE-ACETAMINOPHEN 10-325 MG PO TABS: 1.0000 | ORAL_TABLET | Freq: Four times a day (QID) | ORAL | Status: DC | PRN

## 2011-05-07 MED ORDER — ONETOUCH ULTRASOFT LANCETS MISC: Status: DC

## 2011-05-07 MED ORDER — GLUCOSE BLOOD VI STRP: ORAL_STRIP | Status: DC

## 2011-05-07 NOTE — Telephone Encounter (Signed)
Faxed hardcopy to pharmacy. 

## 2011-05-07 NOTE — Telephone Encounter (Signed)
Done hardcopy to robin  

## 2011-05-12 ENCOUNTER — Other Ambulatory Visit: Payer: Self-pay | Admitting: Internal Medicine

## 2011-05-12 ENCOUNTER — Other Ambulatory Visit: Payer: Self-pay | Admitting: Cardiology

## 2011-05-13 ENCOUNTER — Other Ambulatory Visit: Payer: Self-pay | Admitting: Oncology

## 2011-05-14 NOTE — Telephone Encounter (Signed)
Done per emr 

## 2011-05-23 ENCOUNTER — Telehealth: Payer: Self-pay | Admitting: Oncology

## 2011-05-23 NOTE — Telephone Encounter (Signed)
lmonvm for dtr angie re calling for appt w/GM. Per chart not a new pt schedule for 1hr w/AB.

## 2011-05-24 ENCOUNTER — Telehealth: Payer: Self-pay | Admitting: Oncology

## 2011-05-24 NOTE — Telephone Encounter (Signed)
LMONVM FOR PT/DTR RE CALLING ME FOR APPT W/GM.

## 2011-05-24 NOTE — Telephone Encounter (Signed)
Pt dtr returned call and was given appts for 4/29 and 5/7. Former pt of GM - scehduled per request from dr Nickola Major. Per email exchange btw GM/AM pt to be scheduled w/AB.

## 2011-05-25 ENCOUNTER — Other Ambulatory Visit: Payer: Medicare Other | Admitting: Lab

## 2011-05-28 ENCOUNTER — Other Ambulatory Visit (HOSPITAL_BASED_OUTPATIENT_CLINIC_OR_DEPARTMENT_OTHER): Payer: Medicare Other | Admitting: Lab

## 2011-05-28 DIAGNOSIS — M899 Disorder of bone, unspecified: Secondary | ICD-10-CM

## 2011-05-28 DIAGNOSIS — C7800 Secondary malignant neoplasm of unspecified lung: Secondary | ICD-10-CM

## 2011-05-28 DIAGNOSIS — Z7901 Long term (current) use of anticoagulants: Secondary | ICD-10-CM

## 2011-05-28 DIAGNOSIS — C50319 Malignant neoplasm of lower-inner quadrant of unspecified female breast: Secondary | ICD-10-CM

## 2011-05-29 LAB — COMPREHENSIVE METABOLIC PANEL
ALT: 22 U/L (ref 0–35)
Albumin: 4 g/dL (ref 3.5–5.2)
CO2: 23 mEq/L (ref 19–32)
Calcium: 9.3 mg/dL (ref 8.4–10.5)
Chloride: 107 mEq/L (ref 96–112)
Glucose, Bld: 146 mg/dL — ABNORMAL HIGH (ref 70–99)
Potassium: 4 mEq/L (ref 3.5–5.3)
Sodium: 142 mEq/L (ref 135–145)
Total Protein: 6.5 g/dL (ref 6.0–8.3)

## 2011-05-29 LAB — CANCER ANTIGEN 27.29: CA 27.29: 35 U/mL (ref 0–39)

## 2011-05-29 LAB — VITAMIN D 25 HYDROXY (VIT D DEFICIENCY, FRACTURES): Vit D, 25-Hydroxy: 113 ng/mL — ABNORMAL HIGH (ref 30–89)

## 2011-05-30 ENCOUNTER — Ambulatory Visit: Payer: Medicare Other | Admitting: Physician Assistant

## 2011-06-04 ENCOUNTER — Telehealth: Payer: Self-pay | Admitting: Physician Assistant

## 2011-06-04 NOTE — Telephone Encounter (Signed)
Del.06/04/11 Dx.M Spike Ref. Zenovia Jordan

## 2011-06-05 ENCOUNTER — Ambulatory Visit (HOSPITAL_BASED_OUTPATIENT_CLINIC_OR_DEPARTMENT_OTHER): Payer: Medicare Other | Admitting: Physician Assistant

## 2011-06-05 ENCOUNTER — Ambulatory Visit (HOSPITAL_BASED_OUTPATIENT_CLINIC_OR_DEPARTMENT_OTHER): Payer: Medicare Other | Admitting: Lab

## 2011-06-05 ENCOUNTER — Encounter: Payer: Self-pay | Admitting: Physician Assistant

## 2011-06-05 VITALS — BP 159/106 | HR 91 | Temp 97.3°F | Ht 60.0 in | Wt 210.3 lb

## 2011-06-05 DIAGNOSIS — R5383 Other fatigue: Secondary | ICD-10-CM

## 2011-06-05 DIAGNOSIS — Z170 Estrogen receptor positive status [ER+]: Secondary | ICD-10-CM

## 2011-06-05 DIAGNOSIS — R52 Pain, unspecified: Secondary | ICD-10-CM

## 2011-06-05 DIAGNOSIS — C50319 Malignant neoplasm of lower-inner quadrant of unspecified female breast: Secondary | ICD-10-CM

## 2011-06-05 DIAGNOSIS — R894 Abnormal immunological findings in specimens from other organs, systems and tissues: Secondary | ICD-10-CM

## 2011-06-05 DIAGNOSIS — C50919 Malignant neoplasm of unspecified site of unspecified female breast: Secondary | ICD-10-CM | POA: Insufficient documentation

## 2011-06-05 DIAGNOSIS — C7800 Secondary malignant neoplasm of unspecified lung: Secondary | ICD-10-CM

## 2011-06-05 DIAGNOSIS — Z1231 Encounter for screening mammogram for malignant neoplasm of breast: Secondary | ICD-10-CM

## 2011-06-05 MED ORDER — EXEMESTANE 25 MG PO TABS: 25.0000 mg | ORAL_TABLET | Freq: Every day | ORAL | Status: DC
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
## 2011-06-07 LAB — PROTEIN ELECTROPHORESIS, SERUM
Albumin ELP: 57.1 % (ref 55.8–66.1)
Alpha-1-Globulin: 3.8 % (ref 2.9–4.9)
Beta 2: 5.1 % (ref 3.2–6.5)
Total Protein, Serum Electrophoresis: 6.9 g/dL (ref 6.0–8.3)

## 2011-06-07 LAB — KAPPA/LAMBDA LIGHT CHAINS: Lambda Free Lght Chn: 1.52 mg/dL (ref 0.57–2.63)

## 2011-06-19 ENCOUNTER — Ambulatory Visit (HOSPITAL_COMMUNITY)
Admission: RE | Admit: 2011-06-19 | Discharge: 2011-06-19 | Disposition: A | Discharge status: Home / Self Care | Payer: Medicare Other | Source: Ambulatory Visit | Attending: Physician Assistant | Admitting: Physician Assistant

## 2011-06-19 DIAGNOSIS — R5381 Other malaise: Secondary | ICD-10-CM | POA: Insufficient documentation

## 2011-06-19 DIAGNOSIS — R52 Pain, unspecified: Secondary | ICD-10-CM | POA: Insufficient documentation

## 2011-06-19 DIAGNOSIS — R5383 Other fatigue: Secondary | ICD-10-CM

## 2011-06-19 DIAGNOSIS — C50919 Malignant neoplasm of unspecified site of unspecified female breast: Secondary | ICD-10-CM

## 2011-06-19 DIAGNOSIS — C7800 Secondary malignant neoplasm of unspecified lung: Secondary | ICD-10-CM | POA: Insufficient documentation

## 2011-06-27 ENCOUNTER — Encounter: Payer: Self-pay | Admitting: Internal Medicine

## 2011-06-27 ENCOUNTER — Ambulatory Visit (INDEPENDENT_AMBULATORY_CARE_PROVIDER_SITE_OTHER): Payer: Medicare Other | Admitting: Internal Medicine

## 2011-06-27 VITALS — BP 112/78 | HR 73 | Temp 97.0°F | Ht 60.0 in | Wt 215.0 lb

## 2011-06-27 DIAGNOSIS — G4700 Insomnia, unspecified: Secondary | ICD-10-CM | POA: Insufficient documentation

## 2011-06-27 DIAGNOSIS — H9190 Unspecified hearing loss, unspecified ear: Secondary | ICD-10-CM

## 2011-06-27 DIAGNOSIS — I10 Essential (primary) hypertension: Secondary | ICD-10-CM

## 2011-06-27 DIAGNOSIS — H9193 Unspecified hearing loss, bilateral: Secondary | ICD-10-CM | POA: Insufficient documentation

## 2011-06-27 MED ORDER — TEMAZEPAM 30 MG PO CAPS: 30.0000 mg | ORAL_CAPSULE | Freq: Every evening | ORAL | Status: DC | PRN

## 2011-06-27 NOTE — Assessment & Plan Note (Signed)
Improved s/p irrigation,  to f/u any worsening symptoms or concerns  

## 2011-06-27 NOTE — Assessment & Plan Note (Signed)
Stable, to cont temazapam prn,  to f/u any worsening symptoms or concerns

## 2011-06-27 NOTE — Patient Instructions (Signed)
Continue all other medications as before Your ears were irrigated of wax today Please have the pharmacy call with any refills you may need. Please keep your appointments with your specialists as you have planned Please return in 1 year for your yearly visit, or sooner if needed

## 2011-06-27 NOTE — Progress Notes (Signed)
Subjective:    Patient ID: Rose Sullivan, female    DOB: 12-27-1933, 78 y.o.   MRN: [id number]  HPI  Here with daughter with CC of simply not being able to hear out of both ears it seems for at least 3 days with TV turned up extremely high.  Pt unable to give hx except does seem to have fever, pain, dizziness, ST, cough or sinus symptoms.  Needs temazepam refill, working well for sleep, though she occasionally will get up to go to BR.  Does not walk well but is able to hold on to furniture, no recent falls.   Denies urinary symptoms such as dysuria, frequency, urgency,or hematuria.  Pt denies fever, wt loss, night sweats, loss of appetite, or other constitutional symptoms  Pt denies chest pain, increased sob or doe, wheezing, orthopnea, PND, increased LE swelling, palpitations, dizziness or syncope.  Pt denies new neurological symptoms such as new headache, or facial or extremity weakness or numbness  Past Medical History  Diagnosis Date  . GLUCOSE INTOLERANCE 02/11/2007  . HYPERLIPIDEMIA 02/11/2007  . 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 Other specified forms of hearing loss 09/05/2009  . HYPERTENSION 02/11/2007  . Atrial fibrillation 03/07/2009  . GERD 02/11/2007  . LOW BACK PAIN, CHRONIC 11/25/2006  . BACK PAIN 12/10/2007  . Pain in Soft Tissues of Limb 02/11/2007  . OSTEOPENIA 02/11/2007  . FATIGUE 12/10/2007  . GAIT DISTURBANCE 12/10/2007  . DYSPNEA 03/18/2009  . Cough 03/07/2009  . CHEST PAIN 12/10/2007  . Abdominal pain, chronic, left lower quadrant 04/01/2007  . BREAST CANCER, HX OF 11/25/2006  . Nocturia 05/18/2010  . Hypersomnia 05/18/2010  . Impaired glucose tolerance 05/18/2010   Past Surgical History  Procedure Date  . Dilation and curettage of uterus   . S/p mastectomy   . Appendectomy     reports that she has quit smoking. She does not have any smokeless tobacco history on file. She reports that she does not drink alcohol or use illicit drugs. family history includes  COPD in her brother; Colon cancer in her father; Hypertension in her brother; Lung cancer in her brother; and Stroke in her mother. No Known Allergies Current Outpatient Prescriptions on File Prior to Visit  Medication Sig Dispense Refill  . cyclobenzaprine (FLEXERIL) 5 MG tablet Take 5 mg by mouth 3 (three) times daily as needed.        . diltiazem (CARDIZEM CD) 240 MG 24 hr capsule TAKE ONE CAPSULE BY MOUTH EVERY DAY  30 capsule  10  . diltiazem (DILACOR XR) 240 MG 24 hr capsule Take 240 mg by mouth daily.        Marland Kitchen exemestane (AROMASIN) 25 MG tablet Take 1 tablet (25 mg total) by mouth daily.  90 tablet  3  . furosemide (LASIX) 20 MG tablet TAKE 1 TABLET BY MOUTH EVERY DAY  30 tablet  3  . gabapentin (NEURONTIN) 100 MG capsule TAKE ONE CAPSULE BY MOUTH 3 TIMES A DAY AS NEEDED  90 capsule  2  . glucose blood test strip Use as instructed once daily  100 each  1  . HYDROcodone-acetaminophen (NORCO) 10-325 MG per tablet Take 1 tablet by mouth every 6 (six) hours as needed for pain. PRN  120 tablet  1  . KLOR-CON 10 10 MEQ tablet TAKE 1 TABLET BY MOUTH EVERY DAY  30 tablet  3  . Lancets (ONETOUCH ULTRASOFT) lancets Use as instructed once daily  100 each  1  .  losartan (COZAAR) 50 MG tablet TAKE 1 TABLET BY MOUTH EVERY DAY  30 tablet  11  . omeprazole (PRILOSEC) 20 MG capsule TAKE ONE CAPSULE BY MOUTH EVERY DAY  30 capsule  11  . temazepam (RESTORIL) 30 MG capsule Take 1 capsule (30 mg total) by mouth at bedtime as needed for sleep.  30 capsule  2  . warfarin (COUMADIN) 5 MG tablet Take coumadin as directed by coumadin clinic  40 tablet  0  . DISCONTD: temazepam (RESTORIL) 15 MG capsule Take 1 capsule (15 mg total) by mouth at bedtime as needed for sleep.  30 capsule  2  . DISCONTD: temazepam (RESTORIL) 30 MG capsule Take 1 capsule (30 mg total) by mouth at bedtime as needed for sleep.  30 capsule  2   Review of Systems Review of Systems  Constitutional: Negative for diaphoresis and unexpected  weight change.  HENT: Negative for drooling and tinnitus.   Eyes: Negative for photophobia and visual disturbance.  Respiratory: Negative for choking and stridor.   Gastrointestinal: Negative for vomiting and blood in stool.  Genitourinary: Negative for hematuria and decreased urine volume.  Skin: Negative for color change and wound.  Neurological: Negative for tremors and numbness.     Objective:   Physical Exam BP 112/78  Pulse 73  Temp(Src) 97 F (36.1 C) (Oral)  Ht 5' (1.524 m)  Wt 215 lb (97.523 kg)  BMI 41.99 kg/m2  SpO2 93% Physical Exam  VS noted Constitutional: Pt appears well-developed and well-nourished.  HENT: Head: Normocephalic.  Right Ear: External ear normal.  Left Ear: External ear normal.  Bilat TM's clear and ext canal clear after severe bilat impactions irrigated/cleared, hearing improved, no d/c bilat, swelling or tender Eyes: Conjunctivae and EOM are normal. Pupils are equal, round, and reactive to light.  Neck: Normal range of motion. Neck supple.  Cardiovascular: Normal rate and regular rhythm.   Pulmonary/Chest: Effort normal and breath sounds normal.  Neurological: Pt is alert.  Skin: Skin is warm. No erythema. No rash Psychiatric:  Nonagitated, not nervous today    Assessment & Plan:

## 2011-06-27 NOTE — Assessment & Plan Note (Signed)
stable overall by hx and exam, most recent data reviewed with pt, and pt to continue medical treatment as before BP Readings from Last 3 Encounters:  06/27/11 112/78  06/05/11 159/106  03/30/11 136/84

## 2011-07-02 ENCOUNTER — Ambulatory Visit
Admission: RE | Admit: 2011-07-02 | Discharge: 2011-07-02 | Disposition: A | Discharge status: Home / Self Care | Payer: Medicare Other | Source: Ambulatory Visit | Attending: Physician Assistant | Admitting: Physician Assistant

## 2011-07-02 DIAGNOSIS — C7800 Secondary malignant neoplasm of unspecified lung: Secondary | ICD-10-CM

## 2011-07-02 DIAGNOSIS — Z1231 Encounter for screening mammogram for malignant neoplasm of breast: Secondary | ICD-10-CM

## 2011-07-23 ENCOUNTER — Telehealth: Payer: Self-pay

## 2011-07-23 NOTE — Telephone Encounter (Signed)
Pt is past due for Coumadin follow-up, called spoke with pt's daughter.  She scheduled appt for CVRR on 07/30/11 at 3:15pm.

## 2011-07-31 ENCOUNTER — Other Ambulatory Visit: Payer: Medicare Other | Admitting: Lab

## 2011-07-31 ENCOUNTER — Other Ambulatory Visit: Payer: Self-pay | Admitting: *Deleted

## 2011-07-31 ENCOUNTER — Ambulatory Visit: Payer: Medicare Other | Admitting: Oncology

## 2011-08-06 ENCOUNTER — Other Ambulatory Visit: Payer: Self-pay | Admitting: Oncology

## 2011-08-06 ENCOUNTER — Encounter: Payer: Self-pay | Admitting: Oncology

## 2011-08-06 ENCOUNTER — Telehealth: Payer: Self-pay | Admitting: Oncology

## 2011-08-06 NOTE — Telephone Encounter (Signed)
Sent letter to PT

## 2011-09-06 ENCOUNTER — Telehealth: Payer: Self-pay | Admitting: Cardiology

## 2011-09-06 ENCOUNTER — Other Ambulatory Visit: Payer: Self-pay

## 2011-09-06 ENCOUNTER — Other Ambulatory Visit: Payer: Self-pay | Admitting: Cardiology

## 2011-09-06 MED ORDER — HYDROCODONE-ACETAMINOPHEN 10-325 MG PO TABS: 1.0000 | ORAL_TABLET | Freq: Four times a day (QID) | ORAL | Status: DC | PRN

## 2011-09-06 NOTE — Telephone Encounter (Signed)
Done hardcopy to robin  

## 2011-09-06 NOTE — Telephone Encounter (Signed)
Left message that pt is past due for coumadin check and missed her previously scheduled appt. July 1st.

## 2011-09-07 NOTE — Telephone Encounter (Signed)
Faxed hardcopy to pharmacy. 

## 2011-09-14 ENCOUNTER — Ambulatory Visit (INDEPENDENT_AMBULATORY_CARE_PROVIDER_SITE_OTHER): Payer: Medicare Other

## 2011-09-14 DIAGNOSIS — I4891 Unspecified atrial fibrillation: Secondary | ICD-10-CM

## 2011-09-14 LAB — POCT INR: INR: 1

## 2011-09-14 MED ORDER — WARFARIN SODIUM 5 MG PO TABS: ORAL_TABLET | ORAL | Status: DC

## 2011-10-03 ENCOUNTER — Encounter: Payer: Self-pay | Admitting: Pharmacist

## 2011-10-19 ENCOUNTER — Telehealth: Payer: Self-pay

## 2011-10-19 MED ORDER — HYDROCODONE-ACETAMINOPHEN 10-325 MG PO TABS: 1.0000 | ORAL_TABLET | Freq: Four times a day (QID) | ORAL | Status: DC | PRN

## 2011-10-19 NOTE — Telephone Encounter (Signed)
Faxed hardcopy to pharmacy and called the patients home left detailed message that prescription has been filled.

## 2011-10-19 NOTE — Telephone Encounter (Signed)
Done hardcopy to robin  

## 2011-10-19 NOTE — Telephone Encounter (Signed)
Patient is requesting refill on hydrocodone.  Informed was faxed in on 09/06/11 #120 with 1 refill.  I called the pharmacy and they stated the last refill on that prescription was July 31, 2011.  Please advise as the patient is requesting a refill on medication

## 2011-10-29 ENCOUNTER — Other Ambulatory Visit: Payer: Self-pay | Admitting: *Deleted

## 2011-10-30 ENCOUNTER — Ambulatory Visit (HOSPITAL_BASED_OUTPATIENT_CLINIC_OR_DEPARTMENT_OTHER): Payer: Medicare Other | Admitting: Oncology

## 2011-10-30 ENCOUNTER — Other Ambulatory Visit: Payer: Medicare Other | Admitting: Lab

## 2011-10-30 VITALS — BP 184/95 | HR 97 | Temp 98.0°F | Resp 20 | Ht 60.0 in | Wt 207.0 lb

## 2011-10-30 DIAGNOSIS — C7800 Secondary malignant neoplasm of unspecified lung: Secondary | ICD-10-CM

## 2011-10-30 DIAGNOSIS — Z853 Personal history of malignant neoplasm of breast: Secondary | ICD-10-CM

## 2011-10-30 DIAGNOSIS — D472 Monoclonal gammopathy: Secondary | ICD-10-CM

## 2011-10-30 DIAGNOSIS — C50319 Malignant neoplasm of lower-inner quadrant of unspecified female breast: Secondary | ICD-10-CM

## 2011-10-30 DIAGNOSIS — C50919 Malignant neoplasm of unspecified site of unspecified female breast: Secondary | ICD-10-CM

## 2011-10-30 DIAGNOSIS — H543 Unqualified visual loss, both eyes: Secondary | ICD-10-CM

## 2011-10-30 LAB — CBC WITH DIFFERENTIAL/PLATELET
BASO%: 0.5 % (ref 0.0–2.0)
Basophils Absolute: 0 10*3/uL (ref 0.0–0.1)
EOS%: 3.9 % (ref 0.0–7.0)
HCT: 42.6 % (ref 34.8–46.6)
HGB: 14.3 g/dL (ref 11.6–15.9)
MCH: 30.8 pg (ref 25.1–34.0)
MCHC: 33.6 g/dL (ref 31.5–36.0)
MCV: 91.7 fL (ref 79.5–101.0)
MONO%: 5.4 % (ref 0.0–14.0)
NEUT%: 62.5 % (ref 38.4–76.8)
RDW: 13.3 % (ref 11.2–14.5)
lymph#: 2.4 10*3/uL (ref 0.9–3.3)

## 2011-10-30 LAB — UIFE/LIGHT CHAINS/TP QN, 24-HR UR
Free Kappa Lt Chains,Ur: 6.52 mg/dL — ABNORMAL HIGH (ref 0.14–2.42)
Free Kappa/Lambda Ratio: 28.35 ratio — ABNORMAL HIGH (ref 2.04–10.37)
Free Lambda Lt Chains,Ur: 0.23 mg/dL (ref 0.02–0.67)
Free Lt Chn Excr Rate: 123.88 mg/d
Total Protein, Urine-Ur/day: 139 mg/d (ref 10–140)
Total Protein, Urine: 7.3 mg/dL
Volume, Urine: 1900 mL

## 2011-10-30 LAB — COMPREHENSIVE METABOLIC PANEL (CC13)
AST: 36 U/L — ABNORMAL HIGH (ref 5–34)
Alkaline Phosphatase: 65 U/L (ref 40–150)
BUN: 22 mg/dL (ref 7.0–26.0)
Glucose: 138 mg/dl — ABNORMAL HIGH (ref 70–99)
Potassium: 3.7 mEq/L (ref 3.5–5.1)
Total Bilirubin: 0.9 mg/dL (ref 0.20–1.20)

## 2011-10-30 MED ORDER — EXEMESTANE 25 MG PO TABS: 25.0000 mg | ORAL_TABLET | Freq: Every day | ORAL | Status: DC
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
## 2011-11-01 LAB — PROTEIN ELECTROPHORESIS, SERUM
Albumin ELP: 56.6 % (ref 55.8–66.1)
Alpha-1-Globulin: 3.9 % (ref 2.9–4.9)
Alpha-2-Globulin: 14.6 % — ABNORMAL HIGH (ref 7.1–11.8)
Beta 2: 5.4 % (ref 3.2–6.5)
Beta Globulin: 5.5 % (ref 4.7–7.2)
Gamma Globulin: 14 % (ref 11.1–18.8)

## 2011-11-01 LAB — KAPPA/LAMBDA LIGHT CHAINS: Kappa:Lambda Ratio: 2.05 — ABNORMAL HIGH (ref 0.26–1.65)

## 2011-11-10 ENCOUNTER — Other Ambulatory Visit: Payer: Self-pay | Admitting: Oncology

## 2011-11-10 DIAGNOSIS — C50919 Malignant neoplasm of unspecified site of unspecified female breast: Secondary | ICD-10-CM

## 2011-11-12 ENCOUNTER — Telehealth: Payer: Self-pay | Admitting: Oncology

## 2011-11-12 NOTE — Telephone Encounter (Signed)
lmonvm adviisng the pt of her April 2014 appts along with the cxr referral

## 2011-11-17 ENCOUNTER — Other Ambulatory Visit: Payer: Self-pay | Admitting: Cardiology

## 2012-01-02 ENCOUNTER — Other Ambulatory Visit: Payer: Self-pay | Admitting: Internal Medicine

## 2012-01-03 NOTE — Telephone Encounter (Signed)
Done hardcopy to robin  

## 2012-01-03 NOTE — Telephone Encounter (Signed)
Faxed hardcopy to pharmacy. 

## 2012-01-31 ENCOUNTER — Other Ambulatory Visit: Payer: Self-pay | Admitting: Internal Medicine

## 2012-01-31 NOTE — Telephone Encounter (Signed)
Done hardcopy to robin  

## 2012-02-01 NOTE — Telephone Encounter (Signed)
Faxed hard copy to the pharmacy.

## 2012-03-05 ENCOUNTER — Other Ambulatory Visit: Payer: Self-pay | Admitting: Cardiology

## 2012-04-18 ENCOUNTER — Other Ambulatory Visit (INDEPENDENT_AMBULATORY_CARE_PROVIDER_SITE_OTHER): Payer: Medicare Other

## 2012-04-18 ENCOUNTER — Ambulatory Visit (INDEPENDENT_AMBULATORY_CARE_PROVIDER_SITE_OTHER)
Admission: RE | Admit: 2012-04-18 | Discharge: 2012-04-18 | Disposition: A | Discharge status: Home / Self Care | Payer: Medicare Other | Source: Ambulatory Visit | Attending: Internal Medicine | Admitting: Internal Medicine

## 2012-04-18 ENCOUNTER — Encounter: Payer: Self-pay | Admitting: Internal Medicine

## 2012-04-18 ENCOUNTER — Ambulatory Visit (INDEPENDENT_AMBULATORY_CARE_PROVIDER_SITE_OTHER): Payer: Medicare Other | Admitting: Internal Medicine

## 2012-04-18 VITALS — BP 152/82 | HR 99 | Temp 99.0°F | Ht 60.0 in | Wt 194.0 lb

## 2012-04-18 DIAGNOSIS — E785 Hyperlipidemia, unspecified: Secondary | ICD-10-CM

## 2012-04-18 DIAGNOSIS — R7302 Impaired glucose tolerance (oral): Secondary | ICD-10-CM

## 2012-04-18 DIAGNOSIS — R059 Cough, unspecified: Secondary | ICD-10-CM

## 2012-04-18 DIAGNOSIS — I10 Essential (primary) hypertension: Secondary | ICD-10-CM

## 2012-04-18 DIAGNOSIS — R05 Cough: Secondary | ICD-10-CM

## 2012-04-18 DIAGNOSIS — R7309 Other abnormal glucose: Secondary | ICD-10-CM

## 2012-04-18 DIAGNOSIS — I4891 Unspecified atrial fibrillation: Secondary | ICD-10-CM

## 2012-04-18 LAB — CBC WITH DIFFERENTIAL/PLATELET
Basophils Absolute: 0 10*3/uL (ref 0.0–0.1)
Eosinophils Absolute: 0.7 10*3/uL (ref 0.0–0.7)
HCT: 41.5 % (ref 36.0–46.0)
Hemoglobin: 14.3 g/dL (ref 12.0–15.0)
Lymphs Abs: 1.6 10*3/uL (ref 0.7–4.0)
MCHC: 34.4 g/dL (ref 30.0–36.0)
Monocytes Relative: 6.8 % (ref 3.0–12.0)
Neutro Abs: 7.3 10*3/uL (ref 1.4–7.7)
RDW: 13.2 % (ref 11.5–14.6)

## 2012-04-18 LAB — HEPATIC FUNCTION PANEL
Alkaline Phosphatase: 62 U/L (ref 39–117)
Bilirubin, Direct: 0.2 mg/dL (ref 0.0–0.3)
Total Bilirubin: 0.9 mg/dL (ref 0.3–1.2)

## 2012-04-18 LAB — BASIC METABOLIC PANEL
Calcium: 8.9 mg/dL (ref 8.4–10.5)
GFR: 53.43 mL/min — ABNORMAL LOW (ref >60.00)
Sodium: 139 mEq/L (ref 135–145)

## 2012-04-18 MED ORDER — HYDROCODONE-HOMATROPINE 5-1.5 MG/5ML PO SYRP: 5.0000 mL | ORAL_SOLUTION | Freq: Four times a day (QID) | ORAL | Status: DC | PRN

## 2012-04-18 MED ORDER — LEVOFLOXACIN 250 MG PO TABS: 250.0000 mg | ORAL_TABLET | Freq: Every day | ORAL | Status: DC

## 2012-04-18 MED ORDER — APIXABAN 5 MG PO TABS: 5.0000 mg | ORAL_TABLET | Freq: Two times a day (BID) | ORAL | Status: DC

## 2012-04-18 NOTE — Patient Instructions (Addendum)
Please take all new medication as prescribed - the antibiotic, cough medicine, and the new blood thinner (eliquis) Please continue all other medications as before (but not the coumadin as you stopped 3 months ago) Please go to the XRAY Department in the Basement (go straight as you get off the elevator) for the x-ray testing Please go to the LAB in the Basement (turn left off the elevator) for the tests to be done today You will be contacted by phone if any changes need to be made immediately.  Otherwise, you will receive a letter about your results with an explanation Please remember to sign up for My Chart if you have not done so, as this will be important to you in the future with finding out test results, communicating by private email, and scheduling acute appointments online when needed.  Please keep your appointments with your specialists as you have planned - oncology You will be contacted regarding the referral for: cardiology Please return in 3 months, or sooner if needed

## 2012-04-18 NOTE — Assessment & Plan Note (Addendum)
Daughter states with blindness, poor finances and lack of reliable transportation, pt has quit the coumadin for approx 3 mo and will not re-start; after d/w pt and daughter - ok for eliquis asd, and refer cardiology  Note:  Total time for pt hx, exam, review of record with pt in the room, determination of diagnoses and plan for further eval and tx is > 40 min, with over 50% spent in coordination and counseling of patient

## 2012-04-18 NOTE — Assessment & Plan Note (Signed)
stable overall by history and exam, recent data reviewed with pt, and pt to continue medical treatment as before,  to f/u any worsening symptoms or concerns Lab Results  Component Value Date   [facility]  Value: 103        Total Cholesterol/HDL:CHD Risk Coronary Heart Disease Risk Table                     Men   Women  1/2 Average Risk   3.4   3.3  Average Risk       5.0   4.4  2 X Average Risk   9.6   7.1  3 X Average Risk  23.4   11.0        Use the calculated Patient Ratio above and the CHD Risk Table to determine the patient's CHD Risk.        ATP III CLASSIFICATION (LDL):  <100     mg/dL   Optimal  366-440  mg/dL   Near or Above                    Optimal  130-159  mg/dL   Borderline  347-425  mg/dL   High  >956     mg/dL   Very High* 3/87/5643

## 2012-04-18 NOTE — Progress Notes (Signed)
Subjective:    Patient ID: Rose Sullivan, female    DOB: 08-23-1933, 79 y.o.   MRN: [id number]  HPI   Here with 2-3 days acute onset fever, facial pain, pressure, headache, general weakness and malaise, and greenish d/c, with mild ST and mild prod cough, but pt denies chest pain, wheezing, increased sob or doe, orthopnea, PND, increased LE swelling, palpitations, dizziness or syncope.  Also stopped her coumadin x 3 mo out of frustration over her blindness and lack of transportation ability to have the coumadin checks.  Daughter states received a letter that her cardiologist could no longer see her as was leaving the practice.  I dont see letter sent on EPIC, at any rate is due for f/u.  Pt denies new neurological symptoms such as new headache, or facial or extremity weakness or numbness  Pt denies polydipsia, polyuria Past Medical History  Diagnosis Date  . GLUCOSE INTOLERANCE 02/11/2007  . HYPERLIPIDEMIA 02/11/2007  . 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 Other specified forms of hearing loss 09/05/2009  . HYPERTENSION 02/11/2007  . Atrial fibrillation 03/07/2009  . GERD 02/11/2007  . LOW BACK PAIN, CHRONIC 11/25/2006  . BACK PAIN 12/10/2007  . Pain in Soft Tissues of Limb 02/11/2007  . OSTEOPENIA 02/11/2007  . FATIGUE 12/10/2007  . GAIT DISTURBANCE 12/10/2007  . DYSPNEA 03/18/2009  . Cough 03/07/2009  . CHEST PAIN 12/10/2007  . Abdominal pain, left lower quadrant 04/01/2007  . BREAST CANCER, HX OF 11/25/2006  . Nocturia 05/18/2010  . Hypersomnia 05/18/2010  . Impaired glucose tolerance 05/18/2010   Past Surgical History  Procedure Laterality Date  . Dilation and curettage of uterus    . S/p mastectomy    . Appendectomy      reports that she has quit smoking. She does not have any smokeless tobacco history on file. She reports that she does not drink alcohol or use illicit drugs. family history includes COPD in her brother; Colon cancer in her father; Hypertension in her brother; Lung  cancer in her brother; and Stroke in her mother. No Known Allergies Current Outpatient Prescriptions on File Prior to Visit  Medication Sig Dispense Refill  . cyclobenzaprine (FLEXERIL) 5 MG tablet Take 5 mg by mouth 3 (three) times daily as needed.        . diltiazem (DILACOR XR) 240 MG 24 hr capsule Take 240 mg by mouth daily.        Marland Kitchen exemestane (AROMASIN) 25 MG tablet Take 1 tablet (25 mg total) by mouth daily.  90 tablet  6  . furosemide (LASIX) 20 MG tablet TAKE 1 TABLET BY MOUTH EVERY DAY  30 tablet  3  . gabapentin (NEURONTIN) 100 MG capsule TAKE ONE CAPSULE BY MOUTH 3 TIMES A DAY AS NEEDED  90 capsule  2  . glucose blood test strip Use as instructed once daily  100 each  1  . HYDROcodone-acetaminophen (NORCO) 10-325 MG per tablet TAKE 1 TABLET BY MOUTH EVERY 6 HOURS AS NEEDED FOR PAIN  120 tablet  1  . KLOR-CON 10 10 MEQ tablet TAKE 1 TABLET BY MOUTH EVERY DAY  30 tablet  3  . Lancets (ONETOUCH ULTRASOFT) lancets Use as instructed once daily  100 each  1  . losartan (COZAAR) 50 MG tablet TAKE 1 TABLET BY MOUTH EVERY DAY  30 tablet  4  . omeprazole (PRILOSEC) 20 MG capsule TAKE ONE CAPSULE BY MOUTH EVERY DAY  30 capsule  5  . temazepam (RESTORIL) 30  MG capsule Take 1 capsule (30 mg total) by mouth at bedtime as needed for sleep. To fill July 04, 2011  30 capsule  2   No current facility-administered medications on file prior to visit.   Review of Systems  Constitutional: Negative for unexpected weight change, or unusual diaphoresis  HENT: Negative for tinnitus.   Eyes: Negative for photophobia and visual disturbance.  Respiratory: Negative for choking and stridor.   Gastrointestinal: Negative for vomiting and blood in stool.  Genitourinary: Negative for hematuria and decreased urine volume.  Musculoskeletal: Negative for acute joint swelling Skin: Negative for color change and wound.  Neurological: Negative for tremors and numbness other than noted  Psychiatric/Behavioral:  Negative for decreased concentration or  hyperactivity.       Objective:   Physical Exam BP 152/82  Pulse 99  Temp(Src) 99 F (37.2 C) (Oral)  Ht 5' (1.524 m)  Wt 194 lb (87.998 kg)  BMI 37.89 kg/m2  SpO2 94% VS noted, mild ill Constitutional: Pt appears well-developed and well-nourished.  HENT: Head: NCAT.  Right Ear: External ear normal.  Left Ear: External ear normal.  Eyes: EOM are normal. Pupils are equal, round, and reactive to light.  Bilat tm's with mild erythema.  Max sinus areas mild tender.  Pharynx with mild erythema, no exudate, has bilat conjucntival erythema as well with weepiness Neck: Normal range of motion. Neck supple.  Cardiovascular: Normal rate and regular rhythm.   Pulmonary/Chest: Effort normal and breath sounds with few LLL crackles.  Neurological: Pt is alert. Not confused  Skin: Skin is warm. No erythema.  Psychiatric: Pt behavior is normal. Thought content normal.     Assessment & Plan:

## 2012-04-18 NOTE — Assessment & Plan Note (Signed)
stable overall by history and exam, recent data reviewed with pt, and pt to continue medical treatment as before,  to f/u any worsening symptoms or concerns BP Readings from Last 3 Encounters:  04/18/12 152/82  10/30/11 184/95  06/27/11 112/78

## 2012-04-18 NOTE — Assessment & Plan Note (Signed)
stable overall by history and exam, recent data reviewed with pt, and pt to continue medical treatment as before,  to f/u any worsening symptoms or concerns Lab Results  Component Value Date   HGBA1C 5.8 04/18/2012   For lab f/u today

## 2012-04-18 NOTE — Assessment & Plan Note (Signed)
With fever, malaise and weakness, few LLL rales- ? pna - for cxr, antibx, cough med, labs,  to f/u any worsening symptoms or concerns

## 2012-04-21 ENCOUNTER — Telehealth: Payer: Self-pay

## 2012-04-21 NOTE — Telephone Encounter (Signed)
The patients daughter called to inform she has her appointment tomorrow with the cardiologist.  She is needing advisement if ok to change since the patient is sick?

## 2012-04-21 NOTE — Telephone Encounter (Signed)
No, I think ok to see cardiology, b/c hopefully she is otherwise getting better from the infection last visit

## 2012-04-21 NOTE — Telephone Encounter (Signed)
Patients daughter informed of MD instructions.

## 2012-04-21 NOTE — Telephone Encounter (Signed)
Called left message to call back 

## 2012-04-22 ENCOUNTER — Ambulatory Visit: Payer: Medicare Other | Admitting: Cardiovascular Disease

## 2012-04-25 ENCOUNTER — Ambulatory Visit: Payer: Self-pay | Admitting: Pharmacist

## 2012-04-25 DIAGNOSIS — I4891 Unspecified atrial fibrillation: Secondary | ICD-10-CM

## 2012-04-29 ENCOUNTER — Other Ambulatory Visit (HOSPITAL_BASED_OUTPATIENT_CLINIC_OR_DEPARTMENT_OTHER): Payer: Medicare Other | Admitting: Lab

## 2012-04-29 DIAGNOSIS — C50919 Malignant neoplasm of unspecified site of unspecified female breast: Secondary | ICD-10-CM

## 2012-04-29 DIAGNOSIS — D472 Monoclonal gammopathy: Secondary | ICD-10-CM

## 2012-04-29 DIAGNOSIS — C50319 Malignant neoplasm of lower-inner quadrant of unspecified female breast: Secondary | ICD-10-CM

## 2012-04-29 DIAGNOSIS — Z853 Personal history of malignant neoplasm of breast: Secondary | ICD-10-CM

## 2012-04-29 LAB — COMPREHENSIVE METABOLIC PANEL (CC13)
AST: 35 U/L — ABNORMAL HIGH (ref 5–34)
Albumin: 3.5 g/dL (ref 3.5–5.0)
Alkaline Phosphatase: 63 U/L (ref 40–150)
BUN: 17.5 mg/dL (ref 7.0–26.0)
Creatinine: 1.1 mg/dL (ref 0.6–1.1)
Potassium: 3.7 mEq/L (ref 3.5–5.1)
Total Bilirubin: 1.19 mg/dL (ref 0.20–1.20)

## 2012-04-29 LAB — CBC WITH DIFFERENTIAL/PLATELET
Basophils Absolute: 0 10*3/uL (ref 0.0–0.1)
EOS%: 5.2 % (ref 0.0–7.0)
HGB: 14.7 g/dL (ref 11.6–15.9)
LYMPH%: 23.2 % (ref 14.0–49.7)
MCH: 30.4 pg (ref 25.1–34.0)
MCV: 90.6 fL (ref 79.5–101.0)
MONO%: 4.4 % (ref 0.0–14.0)
Platelets: 237 10*3/uL (ref 145–400)
RDW: 13 % (ref 11.2–14.5)

## 2012-04-30 LAB — PROTEIN / CREATININE RATIO, URINE
Protein Creatinine Ratio: 0.11 (ref <0.15)
Total Protein, Urine: 76 mg/dL

## 2012-05-01 LAB — SPEP & IFE WITH QIG
Beta 2: 5.9 % (ref 3.2–6.5)
Gamma Globulin: 15.9 % (ref 11.1–18.8)
IgA: 338 mg/dL (ref 69–380)
IgG (Immunoglobin G), Serum: 1170 mg/dL (ref 690–1700)
IgM, Serum: 65 mg/dL (ref 52–322)

## 2012-05-06 ENCOUNTER — Ambulatory Visit: Payer: Medicare Other | Admitting: Physician Assistant

## 2012-05-06 ENCOUNTER — Encounter: Payer: Self-pay | Admitting: Physician Assistant

## 2012-05-06 ENCOUNTER — Ambulatory Visit: Payer: Medicare Other | Admitting: Cardiovascular Disease

## 2012-05-06 NOTE — Progress Notes (Signed)
[facility] today.  Letter mailed to patient.  Zollie Scale, PA-C 05/06/2012

## 2012-05-29 ENCOUNTER — Ambulatory Visit (INDEPENDENT_AMBULATORY_CARE_PROVIDER_SITE_OTHER): Payer: Medicare Other | Admitting: Cardiovascular Disease

## 2012-05-29 ENCOUNTER — Encounter: Payer: Self-pay | Admitting: Cardiovascular Disease

## 2012-05-29 VITALS — BP 156/100 | HR 88 | Wt 194.0 lb

## 2012-05-29 DIAGNOSIS — I10 Essential (primary) hypertension: Secondary | ICD-10-CM

## 2012-05-29 DIAGNOSIS — E785 Hyperlipidemia, unspecified: Secondary | ICD-10-CM

## 2012-05-29 DIAGNOSIS — I4891 Unspecified atrial fibrillation: Secondary | ICD-10-CM

## 2012-05-29 NOTE — Assessment & Plan Note (Signed)
Well controlled.  Continue current medications and low sodium Dash type diet.    

## 2012-05-29 NOTE — Assessment & Plan Note (Signed)
Cholesterol is at goal.  Continue current dose of statin and diet Rx.  No myalgias or side effects.  F/U  LFT's in 6 months. Lab Results  Component Value Date   LDLCALC  Value: 103        Total Cholesterol/HDL:CHD Risk Coronary Heart Disease Risk Table                     Men   Women  1/2 Average Risk   3.4   3.3  Average Risk       5.0   4.4  2 X Average Risk   9.6   7.1  3 X Average Risk  23.4   11.0        Use the calculated Patient Ratio above and the CHD Risk Table to determine the patient's CHD Risk.        ATP III CLASSIFICATION (LDL):  <100     mg/dL   Optimal  454-098  mg/dL   Near or Above                    Optimal  130-159  mg/dL   Borderline  119-147  mg/dL   High  >829     mg/dL   Very High* 5/62/1308

## 2012-05-29 NOTE — Assessment & Plan Note (Signed)
NSR today although pulse irregular from PAC/PVC  Take Eliquis as prescribed by Dr Shirlee Latch.

## 2012-05-29 NOTE — Progress Notes (Signed)
Patient ID: Rose Sullivan, female   DOB: Oct 12, 1933, 79 y.o.   MRN: [id number] 79 yo patient of Dr Shirlee Latch  with obesity, deconditioning, and atrial fibrillation. Put on my schedule by mistake . She was admitted in 1/11 with rapid heart rate and dyspnea and found to be in atrial fib with RVR. She converted spontaneously back to NSR. CTA chest was negative for PE. She had a UTI at the time which may have been the trigger for the episode.  At baseline, she has dyspnea with exertion. She is chronically short of breath walking around her house. ? orthopnea => she sleeps on 2 pillows. No chest pain. She uses a wheelchair when she leaves the house. She is quite inactive. Her inactivity seems to be primarily due to knee pain and hip pain from arthritis. She does occasionally walk to the end of the driveway and back. Weight has actually gone down about 20 lbs   Apparantly she got a letter indicating Dr Shirlee Latch was no longer in the practice and Dr Jonny Ruiz set her up to see me??  Has not been taking Eliquis  ROS: Denies fever, malais, weight loss, blurry vision, decreased visual acuity, cough, sputum, SOB, hemoptysis, pleuritic pain, palpitaitons, heartburn, abdominal pain, melena, lower extremity edema, claudication, or rash.  All other systems reviewed and negative  General: Affect appropriate Chronically ill female HEENT: normal Neck supple with no adenopathy JVP normal no bruits no thyromegaly Lungs clear with no wheezing and good diaphragmatic motion Heart:  S1/S2 no murmur, no rub, gallop or click PMI normal Abdomen: benighn, BS positve, no tenderness, no AAA no bruit.  No HSM or HJR Distal pulses intact with no bruits Plus one bilateral  edema Neuro non-focal Skin warm and dry No muscular weakness   Current Outpatient Prescriptions  Medication Sig Dispense Refill  . cyclobenzaprine (FLEXERIL) 5 MG tablet Take 5 mg by mouth 3 (three) times daily as needed.        . diltiazem (DILACOR XR) 240 MG  24 hr capsule Take 240 mg by mouth daily.        Marland Kitchen exemestane (AROMASIN) 25 MG tablet Take 1 tablet (25 mg total) by mouth daily.  90 tablet  6  . furosemide (LASIX) 20 MG tablet TAKE 1 TABLET BY MOUTH EVERY DAY  30 tablet  3  . gabapentin (NEURONTIN) 100 MG capsule TAKE ONE CAPSULE BY MOUTH 3 TIMES A DAY AS NEEDED  90 capsule  2  . glucose blood test strip Use as instructed once daily  100 each  1  . HYDROcodone-acetaminophen (NORCO) 10-325 MG per tablet TAKE 1 TABLET BY MOUTH EVERY 6 HOURS AS NEEDED FOR PAIN  120 tablet  1  . HYDROcodone-homatropine (HYCODAN) 5-1.5 MG/5ML syrup Take 5 mLs by mouth every 6 (six) hours as needed for cough.  120 mL  1  . KLOR-CON 10 10 MEQ tablet TAKE 1 TABLET BY MOUTH EVERY DAY  30 tablet  3  . Lancets (ONETOUCH ULTRASOFT) lancets Use as instructed once daily  100 each  1  . losartan (COZAAR) 50 MG tablet TAKE 1 TABLET BY MOUTH EVERY DAY  30 tablet  4  . omeprazole (PRILOSEC) 20 MG capsule TAKE ONE CAPSULE BY MOUTH EVERY DAY  30 capsule  5  . temazepam (RESTORIL) 30 MG capsule Take 1 capsule (30 mg total) by mouth at bedtime as needed for sleep. To fill July 04, 2011  30 capsule  2   No current facility-administered medications  for this visit.    Allergies  Review of patient's allergies indicates no known allergies.  Electrocardiogram:  SR rate 88 PAC  PVC  Assessment and Plan

## 2012-05-29 NOTE — Patient Instructions (Signed)
See. Dr. Shirlee Latch in 3 months

## 2012-07-19 ENCOUNTER — Other Ambulatory Visit: Payer: Self-pay | Admitting: Internal Medicine

## 2012-07-21 NOTE — Telephone Encounter (Signed)
Faxed hardcopy to [facility] [address] [place] 

## 2012-07-21 NOTE — Telephone Encounter (Signed)
Done hardcopy to robin  

## 2012-07-23 ENCOUNTER — Ambulatory Visit (INDEPENDENT_AMBULATORY_CARE_PROVIDER_SITE_OTHER): Payer: Medicare Other | Admitting: Internal Medicine

## 2012-07-23 ENCOUNTER — Encounter: Payer: Self-pay | Admitting: Internal Medicine

## 2012-07-23 VITALS — BP 132/90 | HR 99 | Temp 97.1°F | Ht 60.0 in | Wt 194.0 lb

## 2012-07-23 DIAGNOSIS — H9193 Unspecified hearing loss, bilateral: Secondary | ICD-10-CM

## 2012-07-23 DIAGNOSIS — J309 Allergic rhinitis, unspecified: Secondary | ICD-10-CM | POA: Insufficient documentation

## 2012-07-23 DIAGNOSIS — H9190 Unspecified hearing loss, unspecified ear: Secondary | ICD-10-CM

## 2012-07-23 DIAGNOSIS — R7309 Other abnormal glucose: Secondary | ICD-10-CM

## 2012-07-23 DIAGNOSIS — R7302 Impaired glucose tolerance (oral): Secondary | ICD-10-CM

## 2012-07-23 DIAGNOSIS — I10 Essential (primary) hypertension: Secondary | ICD-10-CM

## 2012-07-23 NOTE — Patient Instructions (Signed)
Your ears were irrigated both sides today  Please continue all other medications as before, and refills have been done if requested.  Please have the pharmacy call with any other refills you may need.  Please keep your appointments with your specialists as you have planned  No further lab work needed today  Please return in 6 months, or sooner if needed

## 2012-07-23 NOTE — Progress Notes (Signed)
Subjective:    Patient ID: Rose Sullivan, female    DOB: 04-04-1933, 79 y.o.   MRN: [id number]  HPI  Here to f/u; overall doing ok,  Pt denies chest pain, increased sob or doe, wheezing, orthopnea, PND, increased LE swelling, palpitations, dizziness or syncope.  Pt denies polydipsia, polyuria, or low sugar symptoms  Pt denies new neurological symptoms such as new headache, or facial or extremity weakness or numbness.   Pt states overall good compliance with meds, has been trying to follow lower cholesterol diet, with wt overall stable,  but little exercise however. Does have several wks ongoing nasal allergy symptoms with clearish congestion, itch and sneezing, without fever, pain, ST, cough, swelling or wheezing., and eyes are weepy today.  Also has wax impactions to both ears with reduced hearing for last 2 wks Past Medical History  Diagnosis Date  . GLUCOSE INTOLERANCE 02/11/2007  . HYPERLIPIDEMIA 02/11/2007  . 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 Other specified forms of hearing loss 09/05/2009  . HYPERTENSION 02/11/2007  . Atrial fibrillation 03/07/2009  . GERD 02/11/2007  . LOW BACK PAIN, CHRONIC 11/25/2006  . BACK PAIN 12/10/2007  . Pain in Soft Tissues of Limb 02/11/2007  . OSTEOPENIA 02/11/2007  . FATIGUE 12/10/2007  . GAIT DISTURBANCE 12/10/2007  . DYSPNEA 03/18/2009  . Cough 03/07/2009  . CHEST PAIN 12/10/2007  . Abdominal pain, left lower quadrant 04/01/2007  . BREAST CANCER, HX OF 11/25/2006  . Nocturia 05/18/2010  . Hypersomnia 05/18/2010  . Impaired glucose tolerance 05/18/2010   Past Surgical History  Procedure Laterality Date  . Dilation and curettage of uterus    . S/p mastectomy    . Appendectomy      reports that she has quit smoking. She does not have any smokeless tobacco history on file. She reports that she does not drink alcohol or use illicit drugs. family history includes COPD in her brother; Colon cancer in her father; Hypertension in her brother; Lung  cancer in her brother; and Stroke in her mother. No Known Allergies Current Outpatient Prescriptions on File Prior to Visit  Medication Sig Dispense Refill  . apixaban (ELIQUIS) 5 MG TABS tablet Take by mouth 2 (two) times daily.      . cyclobenzaprine (FLEXERIL) 5 MG tablet Take 5 mg by mouth 3 (three) times daily as needed.        . diltiazem (DILACOR XR) 240 MG 24 hr capsule Take 240 mg by mouth daily.        Marland Kitchen exemestane (AROMASIN) 25 MG tablet Take 1 tablet (25 mg total) by mouth daily.  90 tablet  6  . furosemide (LASIX) 20 MG tablet TAKE 1 TABLET BY MOUTH EVERY DAY  30 tablet  3  . gabapentin (NEURONTIN) 100 MG capsule TAKE ONE CAPSULE BY MOUTH 3 TIMES A DAY AS NEEDED  90 capsule  2  . glucose blood test strip Use as instructed once daily  100 each  1  . HYDROcodone-acetaminophen (NORCO) 10-325 MG per tablet TAKE 1 TABLET BY MOUTH EVERY 6 HOURS AS NEEDED FOR PAIN  120 tablet  1  . HYDROcodone-homatropine (HYCODAN) 5-1.5 MG/5ML syrup Take 5 mLs by mouth every 6 (six) hours as needed for cough.  120 mL  1  . KLOR-CON 10 10 MEQ tablet TAKE 1 TABLET BY MOUTH EVERY DAY  30 tablet  3  . Lancets (ONETOUCH ULTRASOFT) lancets Use as instructed once daily  100 each  1  . losartan (COZAAR) 50  MG tablet TAKE 1 TABLET BY MOUTH EVERY DAY  30 tablet  4  . omeprazole (PRILOSEC) 20 MG capsule TAKE ONE CAPSULE BY MOUTH EVERY DAY  30 capsule  5  . temazepam (RESTORIL) 30 MG capsule Take 1 capsule (30 mg total) by mouth at bedtime as needed for sleep. To fill July 04, 2011  30 capsule  2   No current facility-administered medications on file prior to visit.   Review of Systems  Constitutional: Negative for unexpected weight change, or unusual diaphoresis  HENT: Negative for tinnitus.   Eyes: Negative for photophobia and visual disturbance.  Respiratory: Negative for choking and stridor.   Gastrointestinal: Negative for vomiting and blood in stool.  Genitourinary: Negative for hematuria and decreased  urine volume.  Musculoskeletal: Negative for acute joint swelling Skin: Negative for color change and wound.  Neurological: Negative for tremors and numbness other than noted  Psychiatric/Behavioral: Negative for decreased concentration or  hyperactivity.       Objective:   Physical Exam BP 132/90  Pulse 99  Temp(Src) 97.1 F (36.2 C) (Oral)  Ht 5' (1.524 m)  Wt 194 lb (87.998 kg)  BMI 37.89 kg/m2  SpO2 97% VS noted,  Constitutional: Pt appears well-developed and well-nourished.  HENT: Head: NCAT.  Right Ear: External ear normal.  Left Ear: External ear normal.  Bilat tm's with mild erythema.  Max sinus areas non tender.  Pharynx with mild erythema, no exudate Eyes: Conjunctivae with weepiness, clear fluid bilat and EOM are normal. Pupils are equal, round, and reactive to light.  Neck: Normal range of motion. Neck supple.  Cardiovascular: Normal rate and regular rhythm.   Pulmonary/Chest: Effort normal and breath sounds - no rales or wheezing  Neurological: Pt is alert. Skin: Skin is warm. No erythema.  Psychiatric: Pt behavior is normal.    Assessment & Plan:

## 2012-07-23 NOTE — Assessment & Plan Note (Signed)
stable overall by history and exam, recent data reviewed with pt, and pt to continue medical treatment as before,  to f/u any worsening symptoms or concerns Lab Results  Component Value Date   HGBA1C 5.8 04/18/2012    

## 2012-07-23 NOTE — Assessment & Plan Note (Signed)
Declines further tx for now 

## 2012-07-23 NOTE — Assessment & Plan Note (Signed)
stable overall by history and exam, recent data reviewed with pt, and pt to continue medical treatment as before,  to f/u any worsening symptoms or concerns BP Readings from Last 3 Encounters:  07/23/12 132/90  05/29/12 156/100  04/18/12 152/82

## 2012-07-23 NOTE — Assessment & Plan Note (Signed)
Improved with irrigation bilat,  to f/u any worsening symptoms or concerns  

## 2012-07-28 ENCOUNTER — Other Ambulatory Visit: Payer: Self-pay | Admitting: Oncology

## 2012-07-29 ENCOUNTER — Telehealth: Payer: Self-pay | Admitting: Oncology

## 2012-07-31 ENCOUNTER — Other Ambulatory Visit: Payer: Self-pay | Admitting: Oncology

## 2012-07-31 DIAGNOSIS — C50919 Malignant neoplasm of unspecified site of unspecified female breast: Secondary | ICD-10-CM

## 2012-08-11 ENCOUNTER — Other Ambulatory Visit (HOSPITAL_BASED_OUTPATIENT_CLINIC_OR_DEPARTMENT_OTHER): Payer: Medicare Other | Admitting: Lab

## 2012-08-11 DIAGNOSIS — C50919 Malignant neoplasm of unspecified site of unspecified female breast: Secondary | ICD-10-CM

## 2012-08-11 LAB — COMPREHENSIVE METABOLIC PANEL (CC13)
AST: 28 U/L (ref 5–34)
Albumin: 3.2 g/dL — ABNORMAL LOW (ref 3.5–5.0)
Alkaline Phosphatase: 70 U/L (ref 40–150)
BUN: 24.8 mg/dL (ref 7.0–26.0)
Creatinine: 1.1 mg/dL (ref 0.6–1.1)
Glucose: 128 mg/dl (ref 70–140)
Potassium: 3.7 mEq/L (ref 3.5–5.1)

## 2012-08-11 LAB — CBC WITH DIFFERENTIAL/PLATELET
Basophils Absolute: 0.1 10*3/uL (ref 0.0–0.1)
EOS%: 5.4 % (ref 0.0–7.0)
Eosinophils Absolute: 0.4 10*3/uL (ref 0.0–0.5)
HCT: 41.4 % (ref 34.8–46.6)
HGB: 13.9 g/dL (ref 11.6–15.9)
LYMPH%: 31.8 % (ref 14.0–49.7)
MCH: 31.1 pg (ref 25.1–34.0)
MCV: 92.4 fL (ref 79.5–101.0)
MONO%: 6.4 % (ref 0.0–14.0)
NEUT#: 4 10*3/uL (ref 1.5–6.5)
NEUT%: 55.5 % (ref 38.4–76.8)
Platelets: 275 10*3/uL (ref 145–400)

## 2012-08-13 ENCOUNTER — Other Ambulatory Visit: Payer: Self-pay | Admitting: Internal Medicine

## 2012-08-13 ENCOUNTER — Other Ambulatory Visit: Payer: Self-pay | Admitting: Physician Assistant

## 2012-08-19 ENCOUNTER — Ambulatory Visit: Payer: Medicare Other | Admitting: Lab

## 2012-08-19 ENCOUNTER — Ambulatory Visit (HOSPITAL_BASED_OUTPATIENT_CLINIC_OR_DEPARTMENT_OTHER): Payer: Medicare Other | Admitting: Physician Assistant

## 2012-08-19 ENCOUNTER — Encounter: Payer: Self-pay | Admitting: Physician Assistant

## 2012-08-19 ENCOUNTER — Telehealth: Payer: Self-pay | Admitting: *Deleted

## 2012-08-19 VITALS — BP 124/80 | HR 84 | Temp 97.2°F | Resp 20 | Ht 60.0 in | Wt 196.8 lb

## 2012-08-19 DIAGNOSIS — M545 Low back pain, unspecified: Secondary | ICD-10-CM

## 2012-08-19 DIAGNOSIS — C7800 Secondary malignant neoplasm of unspecified lung: Secondary | ICD-10-CM

## 2012-08-19 DIAGNOSIS — Z1231 Encounter for screening mammogram for malignant neoplasm of breast: Secondary | ICD-10-CM

## 2012-08-19 DIAGNOSIS — Z853 Personal history of malignant neoplasm of breast: Secondary | ICD-10-CM

## 2012-08-19 DIAGNOSIS — C50919 Malignant neoplasm of unspecified site of unspecified female breast: Secondary | ICD-10-CM

## 2012-08-19 DIAGNOSIS — C50911 Malignant neoplasm of unspecified site of right female breast: Secondary | ICD-10-CM

## 2012-08-19 MED ORDER — EXEMESTANE 25 MG PO TABS: 25.0000 mg | ORAL_TABLET | Freq: Every day | ORAL | Status: DC
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
## 2012-08-19 NOTE — Telephone Encounter (Signed)
appts made and printed...td 

## 2012-09-06 ENCOUNTER — Other Ambulatory Visit: Payer: Self-pay | Admitting: Internal Medicine

## 2012-09-08 ENCOUNTER — Ambulatory Visit: Payer: Medicare Other

## 2012-09-08 NOTE — Telephone Encounter (Signed)
Done hardcopy to robin  

## 2012-09-08 NOTE — Telephone Encounter (Signed)
Faxed hardcopy to [facility] [address] [place] 

## 2012-09-24 ENCOUNTER — Encounter: Payer: Self-pay | Admitting: Cardiology

## 2012-09-24 ENCOUNTER — Ambulatory Visit (INDEPENDENT_AMBULATORY_CARE_PROVIDER_SITE_OTHER): Payer: Medicare Other | Admitting: Cardiology

## 2012-09-24 VITALS — BP 122/64 | HR 77 | Ht 60.0 in | Wt 197.2 lb

## 2012-09-24 DIAGNOSIS — I4891 Unspecified atrial fibrillation: Secondary | ICD-10-CM

## 2012-09-24 NOTE — Patient Instructions (Addendum)
Your physician wants you to follow-up in: 6 months with Dr Shirlee Latch. (February 2015).  You will receive a reminder letter in the mail two months in advance. If you don't receive a letter, please call our office to schedule the follow-up appointment.

## 2012-09-25 NOTE — Progress Notes (Signed)
Patient ID: Rose Sullivan, female   DOB: 09-12-1933, 79 y.o.   MRN: [id number] PCP:  Dr. Oliver Barre  79 yo with obesity, deconditioning, and paroxysmal atrial fibrillation presents for followup.  She has not had any recent tachypalpitations.  At baseline, she has dyspnea with exertion. She is chronically short of breath walking around her house.  Her weight is actually down since her last appointment in this office. No chest pain.   She uses a wheelchair when she leaves the house.  She is quite inactive. Her inactivity seems to be primarily due to knee pain and hip pain from arthritis.  She does occasionally walk to the end of the driveway and back.  No falls.  No bleeding problems with apixaban.   Labs (1/11): K 3.5, creatinine 0.98 Labs (3/11): K 3.9, creatinine 1.1, BNP 22 Labs (11/11): K 4.6, creatinine 1.23 Labs (4/12): K 4, creatinine 1.48, ALT 42, AST 42 Labs (7/12): K 4.2, creatinine 1.26 Labs (7/14): K 3.7, creatinine 1.1, HCT 41.4  ECG: NSR, normal  Allergies (verified):  No Known Drug Allergies  Past Medical History: 1. Breast cancer (remote) s/p R mastectomy. 2. Hypertension: ACEI cough 3. Hyperlipidemia 4. Atrial flutter s/p ablation 5. Recurrent svt/narrow complex 6. Diet-controlled diabetes. 7. GERD 8. legally blind 9. Osteopenia 10. lumbar disc disease 11. Atrial fibrillation: Paroxysmal.  Echo (1/11) showed EF 60-65%, grade I diastolic dysfunction, PASP 34 mmHg.  12. Lexiscan myoview (2/11): EF 74%, no ischemia or infarction.  13. Obesity 14. Chronic dyspnea 15. Osteoarthritis with chronic knee and hip pain  Family History: Family History Hypertension - brother Family History Lung cancer - brother Family History Other cancer-Colon - father Family History of Respiratory disease - brother COPD Family History Stroke - mother  Social History: Lives with daughter Quit smoking > 40 years ago Alcohol use-no Drug use-no  Current Outpatient Prescriptions   Medication Sig Dispense Refill  . apixaban (ELIQUIS) 5 MG TABS tablet Take by mouth 2 (two) times daily.      . cyclobenzaprine (FLEXERIL) 5 MG tablet Take 5 mg by mouth 3 (three) times daily as needed.        . diltiazem (CARDIZEM CD) 240 MG 24 hr capsule       . diltiazem (DILACOR XR) 240 MG 24 hr capsule Take 240 mg by mouth daily.        Marland Kitchen exemestane (AROMASIN) 25 MG tablet Take 1 tablet (25 mg total) by mouth daily.  90 tablet  3  . furosemide (LASIX) 20 MG tablet TAKE 1 TABLET BY MOUTH EVERY DAY  30 tablet  3  . gabapentin (NEURONTIN) 100 MG capsule TAKE ONE CAPSULE BY MOUTH 3 TIMES A DAY AS NEEDED  90 capsule  0  . glucose blood test strip Use as instructed once daily  100 each  1  . HYDROcodone-acetaminophen (NORCO) 10-325 MG per tablet TAKE 1 TABLET BY MOUTH EVERY 6 HOURS AS NEEDED FOR PAIN  120 tablet  1  . HYDROcodone-homatropine (HYCODAN) 5-1.5 MG/5ML syrup Take 5 mLs by mouth every 6 (six) hours as needed for cough.  120 mL  1  . KLOR-CON 10 10 MEQ tablet TAKE 1 TABLET BY MOUTH EVERY DAY  30 tablet  3  . Lancets (ONETOUCH ULTRASOFT) lancets Use as instructed once daily  100 each  1  . losartan (COZAAR) 50 MG tablet TAKE 1 TABLET BY MOUTH EVERY DAY  30 tablet  4  . omeprazole (PRILOSEC) 20 MG capsule TAKE ONE  CAPSULE BY MOUTH EVERY DAY  30 capsule  5  . temazepam (RESTORIL) 30 MG capsule TAKE ONE CAPSULE BY MOUTH EVERY DAY AT BEDTIME AS NEEDED FOR SLEEP  30 capsule  2   No current facility-administered medications for this visit.   BP 122/64  Pulse 77  Ht 5' (1.524 m)  Wt 89.449 kg (197 lb 3.2 oz)  BMI 38.51 kg/m2  SpO2 97% General:  Well developed, well nourished, in no acute distress.  Obese.  Neck:  Neck supple, JVP not elevated. No masses, thyromegaly or abnormal cervical nodes. Lungs:  Crackles at bases bilaterally Heart:  Non-displaced PMI, chest non-tender; regular rate and rhythm, S1, S2 without murmurs, rubs or gallops. Carotid upstroke normal, no bruit.  Difficult  to palpate pedal pulses. Trace ankle edema, no varicosities. Abdomen:  Bowel sounds positive; abdomen soft and non-tender without masses, organomegaly, or hernias noted. No hepatosplenomegaly. Extremities:  No clubbing or cyanosis. Neurologic:  Alert and oriented x 3. Psych:  Normal affect  Assessment/Plan: 1. Atrial fibrillation: Paroxysmal.  She is in NSR today.  No recent tachypalpitations.  Continue current regimen of apixaban and diltiazem CD.   2. Obesity: She is minimally active.  She is not very motivated.  I encouraged her to walk as much as possible.   Marca Ancona 09/25/2012

## 2012-10-08 ENCOUNTER — Other Ambulatory Visit: Payer: Self-pay | Admitting: Internal Medicine

## 2012-10-08 NOTE — Telephone Encounter (Signed)
Faxed hardcopy to [facility]  

## 2012-10-08 NOTE — Telephone Encounter (Signed)
Done hardcopy to robin  

## 2012-10-27 ENCOUNTER — Other Ambulatory Visit: Payer: Self-pay | Admitting: Internal Medicine

## 2012-11-04 ENCOUNTER — Ambulatory Visit (INDEPENDENT_AMBULATORY_CARE_PROVIDER_SITE_OTHER): Payer: Medicare Other | Admitting: Internal Medicine

## 2012-11-04 ENCOUNTER — Encounter: Payer: Self-pay | Admitting: Internal Medicine

## 2012-11-04 VITALS — BP 140/80 | HR 79 | Temp 98.0°F | Wt 197.0 lb

## 2012-11-04 DIAGNOSIS — L02519 Cutaneous abscess of unspecified hand: Secondary | ICD-10-CM

## 2012-11-04 DIAGNOSIS — R7302 Impaired glucose tolerance (oral): Secondary | ICD-10-CM

## 2012-11-04 DIAGNOSIS — Z7901 Long term (current) use of anticoagulants: Secondary | ICD-10-CM

## 2012-11-04 DIAGNOSIS — I10 Essential (primary) hypertension: Secondary | ICD-10-CM

## 2012-11-04 DIAGNOSIS — R7309 Other abnormal glucose: Secondary | ICD-10-CM

## 2012-11-04 DIAGNOSIS — L02512 Cutaneous abscess of left hand: Secondary | ICD-10-CM

## 2012-11-04 MED ORDER — DOXYCYCLINE HYCLATE 100 MG PO TABS: 100.0000 mg | ORAL_TABLET | Freq: Two times a day (BID) | ORAL | Status: DC

## 2012-11-04 NOTE — Assessment & Plan Note (Signed)
stable overall by history and exam, recent data reviewed with pt, and pt to continue medical treatment as before,  to f/u any worsening symptoms or concerns BP Readings from Last 3 Encounters:  11/04/12 140/80  09/24/12 122/64  08/19/12 124/80

## 2012-11-04 NOTE — Progress Notes (Addendum)
Subjective:    Patient ID: Rose Sullivan, female    DOB: 05-19-1933, 79 y.o.   MRN: [id number]  HPI  Here with daughter, had a hangnail torn off, and woke up today with gradually enlarging pus area to base of right thumbnail and lateral aspect, now quite large, painful with surrounding erythema,  No drainage, fever, red streaks. Is on chronic anticoag with eliquis.  Pt denies polydipsia, polyuria,.  Pt states overall good compliance with meds, trying to follow lower cholesterol, diabetic diet, Pt denies chest pain, increased sob or doe, wheezing, orthopnea, PND, increased LE swelling, palpitations, dizziness or syncope.     Past Medical History  Diagnosis Date  . GLUCOSE INTOLERANCE 02/11/2007  . HYPERLIPIDEMIA 02/11/2007  . 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 Other specified forms of hearing loss 09/05/2009  . HYPERTENSION 02/11/2007  . Atrial fibrillation 03/07/2009  . GERD 02/11/2007  . LOW BACK PAIN, CHRONIC 11/25/2006  . BACK PAIN 12/10/2007  . Pain in Soft Tissues of Limb 02/11/2007  . OSTEOPENIA 02/11/2007  . FATIGUE 12/10/2007  . GAIT DISTURBANCE 12/10/2007  . DYSPNEA 03/18/2009  . Cough 03/07/2009  . CHEST PAIN 12/10/2007  . Abdominal pain, left lower quadrant 04/01/2007  . BREAST CANCER, HX OF 11/25/2006  . Nocturia 05/18/2010  . Hypersomnia 05/18/2010  . Impaired glucose tolerance 05/18/2010   Past Surgical History  Procedure Laterality Date  . Dilation and curettage of uterus    . S/p mastectomy    . Appendectomy      reports that she has quit smoking. She has never used smokeless tobacco. She reports that she does not drink alcohol or use illicit drugs. family history includes COPD in her brother; Colon cancer in her father; Hypertension in her brother; Lung cancer in her brother; Stroke in her mother. No Known Allergies Current Outpatient Prescriptions on File Prior to Visit  Medication Sig Dispense Refill  . apixaban (ELIQUIS) 5 MG TABS tablet Take by mouth 2  (two) times daily.      . cyclobenzaprine (FLEXERIL) 5 MG tablet Take 5 mg by mouth 3 (three) times daily as needed.        . diltiazem (CARDIZEM CD) 240 MG 24 hr capsule       . diltiazem (CARDIZEM CD) 240 MG 24 hr capsule TAKE ONE CAPSULE BY MOUTH EVERY DAY  30 capsule  5  . diltiazem (DILACOR XR) 240 MG 24 hr capsule Take 240 mg by mouth daily.        Marland Kitchen exemestane (AROMASIN) 25 MG tablet Take 1 tablet (25 mg total) by mouth daily.  90 tablet  3  . furosemide (LASIX) 20 MG tablet TAKE 1 TABLET BY MOUTH EVERY DAY  30 tablet  3  . gabapentin (NEURONTIN) 100 MG capsule TAKE ONE CAPSULE BY MOUTH 3 TIMES A DAY AS NEEDED  90 capsule  3  . glucose blood test strip Use as instructed once daily  100 each  1  . HYDROcodone-acetaminophen (NORCO) 10-325 MG per tablet TABLET BY MOUTH EVERY 6 HOURS AS NEEDED FOR PAIN  120 tablet  1  . HYDROcodone-homatropine (HYCODAN) 5-1.5 MG/5ML syrup Take 5 mLs by mouth every 6 (six) hours as needed for cough.  120 mL  1  . KLOR-CON 10 10 MEQ tablet TAKE 1 TABLET BY MOUTH EVERY DAY  30 tablet  3  . Lancets (ONETOUCH ULTRASOFT) lancets Use as instructed once daily  100 each  1  . losartan (COZAAR) 50 MG tablet TAKE  1 TABLET BY MOUTH EVERY DAY  30 tablet  4  . omeprazole (PRILOSEC) 20 MG capsule TAKE ONE CAPSULE BY MOUTH EVERY DAY  30 capsule  5  . temazepam (RESTORIL) 30 MG capsule TAKE ONE CAPSULE BY MOUTH EVERY DAY AT BEDTIME AS NEEDED FOR SLEEP  30 capsule  2   No current facility-administered medications on file prior to visit.   Review of Systems  Constitutional: Negative for unexpected weight change, or unusual diaphoresis  HENT: Negative for tinnitus.   Eyes: Negative for photophobia and visual disturbance.  Respiratory: Negative for choking and stridor.   Gastrointestinal: Negative for vomiting and blood in stool.  Genitourinary: Negative for hematuria and decreased urine volume.  Musculoskeletal: Negative for acute joint swelling Skin: Negative for color  change and wound.  Neurological: Negative for tremors and numbness other than noted  Psychiatric/Behavioral: Negative for decreased concentration or  hyperactivity.       Objective:   Physical Exam BP 140/80  Pulse 79  Temp(Src) 98 F (36.7 C) (Oral)  Wt 197 lb (89.359 kg)  BMI 38.47 kg/m2  SpO2 95% VS noted,  Constitutional: Pt appears well-developed and well-nourished.  HENT: Head: NCAT.  Right Ear: External ear normal.  Left Ear: External ear normal.  Eyes: Conjunctivae and EOM are normal. Pupils are equal, round, and reactive to light.  Neck: Normal range of motion. Neck supple.  Cardiovascular: Normal rate and regular rhythm.   Pulmonary/Chest: Effort normal and breath sounds normal.  - no rales or wheezing Neurological: Pt is alert. Not confused  Skin: Skin is warm. No erythema. except < 1/2 cm area about a large paronychia/abscess at base of nail and lateral aspect right thumb without drainage or red streaks Psychiatric: Pt behavior is normal. Thought content normal.     Assessment & Plan:  Quality Measures addressed:  Pneumonia Vaccine: pt declines, may self-refer to local pharmacy

## 2012-11-04 NOTE — Patient Instructions (Signed)
Please take all new medication as prescribed - the antibiotic Please continue all other medications as before You will be contacted regarding the referral for: hand surgeon tomorrow due to the size of the abscess and the blood thinner use  Please remember to sign up for My Chart if you have not done so, as this will be important to you in the future with finding out test results, communicating by private email, and scheduling acute appointments online when needed.

## 2012-11-04 NOTE — Assessment & Plan Note (Signed)
stable overall by history and exam, recent data reviewed with pt, and pt to continue medical treatment as before,  to f/u any worsening symptoms or concerns Lab Results  Component Value Date   HGBA1C 5.8 04/18/2012    

## 2012-11-04 NOTE — Assessment & Plan Note (Signed)
To cont eliquis for now,  to f/u any worsening symptoms or concerns

## 2012-11-05 ENCOUNTER — Encounter (HOSPITAL_COMMUNITY): Payer: Self-pay | Admitting: Emergency Medicine

## 2012-11-05 ENCOUNTER — Emergency Department (HOSPITAL_COMMUNITY): Payer: Medicare Other

## 2012-11-05 ENCOUNTER — Emergency Department (HOSPITAL_COMMUNITY)
Admission: EM | Admit: 2012-11-05 | Discharge: 2012-11-05 | Disposition: A | Discharge status: Home / Self Care | Payer: Medicare Other | Attending: Emergency Medicine | Admitting: Emergency Medicine

## 2012-11-05 DIAGNOSIS — Z862 Personal history of diseases of the blood and blood-forming organs and certain disorders involving the immune mechanism: Secondary | ICD-10-CM | POA: Insufficient documentation

## 2012-11-05 DIAGNOSIS — I10 Essential (primary) hypertension: Secondary | ICD-10-CM | POA: Insufficient documentation

## 2012-11-05 DIAGNOSIS — Z87891 Personal history of nicotine dependence: Secondary | ICD-10-CM | POA: Insufficient documentation

## 2012-11-05 DIAGNOSIS — Z79899 Other long term (current) drug therapy: Secondary | ICD-10-CM | POA: Insufficient documentation

## 2012-11-05 DIAGNOSIS — R5381 Other malaise: Secondary | ICD-10-CM | POA: Insufficient documentation

## 2012-11-05 DIAGNOSIS — Z8669 Personal history of other diseases of the nervous system and sense organs: Secondary | ICD-10-CM | POA: Insufficient documentation

## 2012-11-05 DIAGNOSIS — Z792 Long term (current) use of antibiotics: Secondary | ICD-10-CM | POA: Insufficient documentation

## 2012-11-05 DIAGNOSIS — K219 Gastro-esophageal reflux disease without esophagitis: Secondary | ICD-10-CM | POA: Insufficient documentation

## 2012-11-05 DIAGNOSIS — L03019 Cellulitis of unspecified finger: Secondary | ICD-10-CM | POA: Insufficient documentation

## 2012-11-05 DIAGNOSIS — Z8639 Personal history of other endocrine, nutritional and metabolic disease: Secondary | ICD-10-CM | POA: Insufficient documentation

## 2012-11-05 DIAGNOSIS — G8929 Other chronic pain: Secondary | ICD-10-CM | POA: Insufficient documentation

## 2012-11-05 DIAGNOSIS — Z8739 Personal history of other diseases of the musculoskeletal system and connective tissue: Secondary | ICD-10-CM | POA: Insufficient documentation

## 2012-11-05 DIAGNOSIS — I4891 Unspecified atrial fibrillation: Secondary | ICD-10-CM | POA: Insufficient documentation

## 2012-11-05 DIAGNOSIS — L03012 Cellulitis of left finger: Secondary | ICD-10-CM

## 2012-11-05 DIAGNOSIS — Z853 Personal history of malignant neoplasm of breast: Secondary | ICD-10-CM | POA: Insufficient documentation

## 2012-11-05 MED ORDER — CEPHALEXIN 500 MG PO CAPS: 500.0000 mg | ORAL_CAPSULE | Freq: Four times a day (QID) | ORAL | Status: DC

## 2012-11-05 NOTE — ED Notes (Signed)
The pt has an infected lt thumb for 7 days.  She bites her fingernails.  Maybe a temp

## 2012-11-05 NOTE — ED Provider Notes (Signed)
CSN: [id number]     Arrival date & time 11/05/12  1611 History   First MD Initiated Contact with Patient 11/05/12 1705     This chart was scribed for Rose Sullivan Sol Englert - PA by Ladona Ridgel Day, ED scribe. This patient was seen in room TR10C/TR10C and the patient's care was started at 1611.  Chief Complaint  Patient presents with  . thumb infection    Patient is a 79 y.o. female presenting with abscess. The history is provided by the patient. No language interpreter was used.  Abscess Location:  Finger Finger abscess location:  L thumb Size:  2 cm Abscess quality: fluctuance and painful   Red streaking: no   Duration:  3 days Progression:  Worsening Pain details:    Quality:  Aching and throbbing   Severity:  Mild   Duration:  3 days   Timing:  Constant   Progression:  Worsening Chronicity:  New Context: skin injury (biting her nails often)   Context: not injected drug use   Relieved by:  Nothing Worsened by:  Nothing tried Ineffective treatments:  None tried Associated symptoms: no fever, no nausea and no vomiting    HPI Comments: Rose Sullivan is a 79 y.o. female who presents to the Emergency Department complaining of swollen painful and infected left thumb specifically around the cuticle of her thumb, onset 3 days ago. She reports that she bites her nails often. She denies any drainage from her thumb at this point but reports collection of pus under the skin around the cuticle of her thumb. She denies chills, fever, nausea, emesis. She states baseline SOB. She has no allergies to medicines. She has not attempted any treatments. Nothing seems to make it better or worse.  Past Medical History  Diagnosis Date  . GLUCOSE INTOLERANCE 02/11/2007  . HYPERLIPIDEMIA 02/11/2007  . 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 Other specified forms of hearing loss 09/05/2009  . HYPERTENSION 02/11/2007  . Atrial fibrillation 03/07/2009  . GERD 02/11/2007  . LOW BACK PAIN, CHRONIC  11/25/2006  . BACK PAIN 12/10/2007  . Pain in Soft Tissues of Limb 02/11/2007  . OSTEOPENIA 02/11/2007  . FATIGUE 12/10/2007  . GAIT DISTURBANCE 12/10/2007  . DYSPNEA 03/18/2009  . Cough 03/07/2009  . CHEST PAIN 12/10/2007  . Abdominal pain, left lower quadrant 04/01/2007  . BREAST CANCER, HX OF 11/25/2006  . Nocturia 05/18/2010  . Hypersomnia 05/18/2010  . Impaired glucose tolerance 05/18/2010  . Allergic rhinitis, cause unspecified 07/23/2012   Past Surgical History  Procedure Laterality Date  . Dilation and curettage of uterus    . S/p mastectomy    . Appendectomy     Family History  Problem Relation Age of Onset  . Hypertension Brother   . Lung cancer Brother   . Colon cancer Father   . COPD Brother   . Stroke Mother    History  Substance Use Topics  . Smoking status: Former Games developer  . Smokeless tobacco: Never Used     Comment: quit 40 years ago  . Alcohol Use: No   OB History   Grav Para Term Preterm Abortions TAB SAB Ect Mult Living                 Review of Systems  Constitutional: Negative for fever and chills.  HENT: Negative for rhinorrhea.   Respiratory: Negative for shortness of breath.   Cardiovascular: Negative for chest pain.  Gastrointestinal: Negative for nausea, vomiting and abdominal pain.  Skin:  Positive for color change and wound (swollen, painful left thumb appearing infected).  Neurological: Negative for weakness and numbness.  Psychiatric/Behavioral: The patient is not nervous/anxious.   All other systems reviewed and are negative.   A complete 10 system review of systems was obtained and all systems are negative except as noted in the HPI and PMH.   Allergies  Review of patient's allergies indicates no known allergies.  Home Medications   Current Outpatient Rx  Name  Route  Sig  Dispense  Refill  . diltiazem (CARDIZEM CD) 240 MG 24 hr capsule   Oral   Take 240 mg by mouth daily.          Marland Kitchen doxycycline (VIBRA-TABS) 100 MG tablet    Oral   Take 1 tablet (100 mg total) by mouth 2 (two) times daily.   20 tablet   0   . furosemide (LASIX) 20 MG tablet   Oral   Take 20 mg by mouth daily.         Marland Kitchen gabapentin (NEURONTIN) 100 MG capsule   Oral   Take 100 mg by mouth 3 (three) times daily.         Marland Kitchen glucose blood test strip      Use as instructed once daily   100 each   1   . HYDROcodone-acetaminophen (NORCO) 10-325 MG per tablet   Oral   Take 1 tablet by mouth every 6 (six) hours as needed for pain.         Marland Kitchen HYDROcodone-homatropine (HYCODAN) 5-1.5 MG/5ML syrup   Oral   Take 5 mLs by mouth every 6 (six) hours as needed for cough.   120 mL   1   . Lancets (ONETOUCH ULTRASOFT) lancets      Use as instructed once daily   100 each   1   . losartan (COZAAR) 50 MG tablet   Oral   Take 50 mg by mouth daily.         Marland Kitchen omeprazole (PRILOSEC) 20 MG capsule   Oral   Take 20 mg by mouth daily.         . potassium chloride (K-DUR,KLOR-CON) 10 MEQ tablet   Oral   Take 10 mEq by mouth daily.         . temazepam (RESTORIL) 30 MG capsule   Oral   Take 30 mg by mouth at bedtime as needed for sleep.         Marland Kitchen apixaban (ELIQUIS) 5 MG TABS tablet   Oral   Take 5 mg by mouth 2 (two) times daily.          . cephALEXin (KEFLEX) 500 MG capsule   Oral   Take 1 capsule (500 mg total) by mouth 4 (four) times daily.   40 capsule   0   . cyclobenzaprine (FLEXERIL) 5 MG tablet   Oral   Take 5 mg by mouth 3 (three) times daily as needed.           Marland Kitchen exemestane (AROMASIN) 25 MG tablet   Oral   Take 1 tablet (25 mg total) by mouth daily.   90 tablet   3     DX Code Needed .    Triage Vitals: BP 141/75  Pulse 82  Temp(Src) 97.4 F (36.3 C) (Oral)  Resp 18  Wt 185 lb 3.2 oz (84.006 kg)  BMI 36.17 kg/m2  SpO2 96% Physical Exam  Nursing note and vitals reviewed. Constitutional: She is  oriented to person, place, and time. She appears well-developed and well-nourished. No distress.  HENT:   Head: Normocephalic and atraumatic.  Eyes: Conjunctivae are normal. No scleral icterus.  Neck: Normal range of motion.  Cardiovascular: Normal rate, regular rhythm, normal heart sounds and intact distal pulses.   No murmur heard. Capillary refill is less than 3 seconds in the left hand.   Pulmonary/Chest: Effort normal and breath sounds normal. No respiratory distress. She has no wheezes.  Abdominal: Soft. She exhibits no distension. There is no tenderness.  Musculoskeletal:  Full ROM of her left thumb.   Lymphadenopathy:    She has no cervical adenopathy.  Neurological: She is alert and oriented to person, place, and time.  Sensation is intact and she has 5/5 strength in the left thumb and hand.   Skin: Skin is warm and dry. She is not diaphoretic. There is erythema.  Left thumb erythema, induration and swelling to the medial portion of the nailbed with obvious purulent collection and area of fluctuance, tender to palpation.   Psychiatric: She has a normal mood and affect.    ED Course  Procedures (including critical care time) DIAGNOSTIC STUDIES: Oxygen Saturation is 96% on room air, normal by my interpretation.    COORDINATION OF CARE: At 530 PM Discussed treatment plan with patient which includes incision and drainage to her left thumb, X-ray left thumb. Patient agrees.  INCISION AND DRAINAGE Performed by: Dierdre Forth - PA Consent: Verbal consent obtained. Risks and benefits: risks, benefits and alternatives were discussed Type: abscess  Body area: left thumb  Anesthesia: local infiltration  Incision was made with a scalpel.  Local anesthetic: lidocaine 2% without epinephrine  Anesthetic total: 3 ml  Complexity: complex Blunt dissection to break up loculations  Drainage: purulent  Drainage amount: copious  Patient tolerance: Patient tolerated the procedure well with no immediate complications.    Labs Review Labs Reviewed - No data to  display Imaging Review Dg Finger Thumb Left  11/05/2012   CLINICAL DATA:  Infection around thumb nail  EXAM: LEFT THUMB 2+V  COMPARISON:  None.  FINDINGS: There is mild soft tissue swelling around the distal phalanx. The underlying bony structures appear intact. No focal bone erosion identified. No fracture or dislocation identified. No radio-opaque foreign body or soft tissue calcifications identified.  IMPRESSION: 1. Soft tissue swelling.  2. No bony destruction identified.   Electronically Signed   By: Signa Kell M.D.   On: 11/05/2012 17:01    MDM   1. Paronychia of left thumb       CHEVY VIRGO presents with paronychia likely 2/2 her nail biting.  No evidence of systemic infection; no systemic symptoms.  Pt afebrile and non tachycardic.  X-ray without evidence of osteomyelitis.  I personally reviewed the imaging tests through PACS system.  I reviewed available ER/hospitalization records through the EMR.    Paronychia amenable to incision and drainage.  Abscess was not large enough to warrant packing or drain,  wound recheck in 2 days. Encouraged home warm soaks and flushing.  Mild signs of cellulitis is surrounding skin.  Will d/c to home with Keflex antibiotic therapy.  It has been determined that no acute conditions requiring further emergency intervention are present at this time. The patient/guardian have been advised of the diagnosis and plan. We have discussed signs and symptoms that warrant return to the ED, such as changes or worsening in symptoms.   Vital signs are stable at discharge.   BP  141/75  Pulse 82  Temp(Src) 97.4 F (36.3 C) (Oral)  Resp 18  Wt 185 lb 3.2 oz (84.006 kg)  BMI 36.17 kg/m2  SpO2 96%  Patient/guardian has voiced understanding and agreed to follow-up with the PCP or specialist.   I personally performed the services described in this documentation, which was scribed in my presence. The recorded information has been reviewed and is  accurate.       Rose Sullivan Zoriyah Scheidegger, PA-C 11/05/12 1815  Torian Quintero, PA-C 11/05/12 2042

## 2012-11-05 NOTE — ED Notes (Signed)
Patient transported to X-ray 

## 2012-11-05 NOTE — ED Notes (Signed)
The pt was seen by her doctor yesterday.  No xrays have been  Taken.  Papers with her

## 2012-11-05 NOTE — ED Notes (Signed)
Pt. Up to Bathroom with assistance.

## 2012-11-05 NOTE — ED Notes (Signed)
Pt. Stated, My thumb has been that way for about a week.  Left thumb on the tip red with a dime measurement of yellowish pus.  Tender to touch.

## 2012-11-07 NOTE — ED Provider Notes (Signed)
Medical screening examination/treatment/procedure(s) were performed by non-physician practitioner and as supervising physician I was immediately available for consultation/collaboration.   Yoshika Vensel B. Mally Gavina, MD 11/07/12 2029 

## 2012-11-25 ENCOUNTER — Other Ambulatory Visit: Payer: Self-pay | Admitting: Internal Medicine

## 2012-12-09 ENCOUNTER — Other Ambulatory Visit: Payer: Self-pay | Admitting: Cardiology

## 2012-12-18 ENCOUNTER — Emergency Department (HOSPITAL_COMMUNITY): Payer: Medicare Other

## 2012-12-18 ENCOUNTER — Emergency Department (HOSPITAL_COMMUNITY)
Admission: EM | Admit: 2012-12-18 | Discharge: 2012-12-18 | Disposition: A | Discharge status: Home / Self Care | Payer: Medicare Other | Attending: Emergency Medicine | Admitting: Emergency Medicine

## 2012-12-18 ENCOUNTER — Encounter (HOSPITAL_COMMUNITY): Payer: Self-pay | Admitting: Emergency Medicine

## 2012-12-18 DIAGNOSIS — I4891 Unspecified atrial fibrillation: Secondary | ICD-10-CM | POA: Insufficient documentation

## 2012-12-18 DIAGNOSIS — R296 Repeated falls: Secondary | ICD-10-CM | POA: Insufficient documentation

## 2012-12-18 DIAGNOSIS — G8929 Other chronic pain: Secondary | ICD-10-CM | POA: Insufficient documentation

## 2012-12-18 DIAGNOSIS — M25511 Pain in right shoulder: Secondary | ICD-10-CM

## 2012-12-18 DIAGNOSIS — Z8639 Personal history of other endocrine, nutritional and metabolic disease: Secondary | ICD-10-CM | POA: Insufficient documentation

## 2012-12-18 DIAGNOSIS — Y9389 Activity, other specified: Secondary | ICD-10-CM | POA: Insufficient documentation

## 2012-12-18 DIAGNOSIS — I10 Essential (primary) hypertension: Secondary | ICD-10-CM | POA: Insufficient documentation

## 2012-12-18 DIAGNOSIS — H548 Legal blindness, as defined in USA: Secondary | ICD-10-CM | POA: Insufficient documentation

## 2012-12-18 DIAGNOSIS — Z79899 Other long term (current) drug therapy: Secondary | ICD-10-CM | POA: Insufficient documentation

## 2012-12-18 DIAGNOSIS — Z8709 Personal history of other diseases of the respiratory system: Secondary | ICD-10-CM | POA: Insufficient documentation

## 2012-12-18 DIAGNOSIS — S46909A Unspecified injury of unspecified muscle, fascia and tendon at shoulder and upper arm level, unspecified arm, initial encounter: Secondary | ICD-10-CM | POA: Insufficient documentation

## 2012-12-18 DIAGNOSIS — Z862 Personal history of diseases of the blood and blood-forming organs and certain disorders involving the immune mechanism: Secondary | ICD-10-CM | POA: Insufficient documentation

## 2012-12-18 DIAGNOSIS — IMO0002 Reserved for concepts with insufficient information to code with codable children: Secondary | ICD-10-CM | POA: Insufficient documentation

## 2012-12-18 DIAGNOSIS — M549 Dorsalgia, unspecified: Secondary | ICD-10-CM

## 2012-12-18 DIAGNOSIS — H918X9 Other specified hearing loss, unspecified ear: Secondary | ICD-10-CM | POA: Insufficient documentation

## 2012-12-18 DIAGNOSIS — K219 Gastro-esophageal reflux disease without esophagitis: Secondary | ICD-10-CM | POA: Insufficient documentation

## 2012-12-18 DIAGNOSIS — Y929 Unspecified place or not applicable: Secondary | ICD-10-CM | POA: Insufficient documentation

## 2012-12-18 DIAGNOSIS — Z87891 Personal history of nicotine dependence: Secondary | ICD-10-CM | POA: Insufficient documentation

## 2012-12-18 DIAGNOSIS — S4980XA Other specified injuries of shoulder and upper arm, unspecified arm, initial encounter: Secondary | ICD-10-CM | POA: Insufficient documentation

## 2012-12-18 DIAGNOSIS — W19XXXA Unspecified fall, initial encounter: Secondary | ICD-10-CM

## 2012-12-18 DIAGNOSIS — R0602 Shortness of breath: Secondary | ICD-10-CM | POA: Insufficient documentation

## 2012-12-18 DIAGNOSIS — Z853 Personal history of malignant neoplasm of breast: Secondary | ICD-10-CM | POA: Insufficient documentation

## 2012-12-18 DIAGNOSIS — Z8739 Personal history of other diseases of the musculoskeletal system and connective tissue: Secondary | ICD-10-CM | POA: Insufficient documentation

## 2012-12-18 LAB — CBC WITH DIFFERENTIAL/PLATELET
Eosinophils Absolute: 0.2 10*3/uL (ref 0.0–0.7)
Eosinophils Relative: 2 % (ref 0–5)
Hemoglobin: 14.8 g/dL (ref 12.0–15.0)
Lymphocytes Relative: 23 % (ref 12–46)
Lymphs Abs: 1.9 10*3/uL (ref 0.7–4.0)
MCH: 31.4 pg (ref 26.0–34.0)
MCHC: 34.4 g/dL (ref 30.0–36.0)
MCV: 91.1 fL (ref 78.0–100.0)
Monocytes Absolute: 0.5 10*3/uL (ref 0.1–1.0)
Monocytes Relative: 6 % (ref 3–12)
Neutro Abs: 5.6 10*3/uL (ref 1.7–7.7)
Neutrophils Relative %: 69 % (ref 43–77)
Platelets: 258 10*3/uL (ref 150–400)
RBC: 4.72 MIL/uL (ref 3.87–5.11)
RDW: 12.4 % (ref 11.5–15.5)
WBC: 8.2 10*3/uL (ref 4.0–10.5)

## 2012-12-18 LAB — BASIC METABOLIC PANEL
BUN: 15 mg/dL (ref 6–23)
CO2: 23 mEq/L (ref 19–32)
Calcium: 9.1 mg/dL (ref 8.4–10.5)
GFR calc non Af Amer: 50 mL/min — ABNORMAL LOW (ref >90)
Glucose, Bld: 147 mg/dL — ABNORMAL HIGH (ref 70–99)
Potassium: 3.8 mEq/L (ref 3.5–5.1)
Sodium: 137 mEq/L (ref 135–145)

## 2012-12-18 LAB — POCT I-STAT TROPONIN I

## 2012-12-18 MED ORDER — MORPHINE SULFATE 4 MG/ML IJ SOLN
4.0000 mg | Freq: Once | INTRAMUSCULAR | Status: AC
  Administered 2012-12-18: 4 mg via INTRAVENOUS
  Filled 2012-12-18: qty 1

## 2012-12-18 MED ORDER — OXYCODONE-ACETAMINOPHEN 5-325 MG PO TABS: 1.0000 | ORAL_TABLET | Freq: Three times a day (TID) | ORAL | Status: DC | PRN

## 2012-12-18 NOTE — ED Provider Notes (Signed)
TIME SEEN: 1:50 PM  CHIEF COMPLAINT: Fall, shortness of breath, back pain  HPI: Patient is a 79 year old female with a history of A. fib on Eliquis, hypertension, diabetes, hyperlipidemia, history of breast cancer status post mastectomy who presents to the ED and with complaints of diffuse back pain, bilateral shoulder pain after a fall 2 weeks ago. Patient reports that she was attempting to sit on the toilet when she lost her balance and fell onto her side. She is not sure if she hit her head but denies loss of consciousness. Since that time she has had diffuse back pain, left-sided posterior rib pain, bilateral shoulder pain. She states she feels it is difficult to breathe secondary to pain. Denies any numbness, tingling or focal weakness. No recent infectious symptoms. No vomiting or diarrhea. No chest pain.  ROS: See HPI Constitutional: no fever  Eyes: no drainage  ENT: no runny nose   Cardiovascular:  no chest pain  Resp: SOB  GI: no vomiting GU: no dysuria Integumentary: no rash  Allergy: no hives  Musculoskeletal: no leg swelling  Neurological: no slurred speech ROS otherwise negative  PAST MEDICAL HISTORY/PAST SURGICAL HISTORY:  Past Medical History  Diagnosis Date  . GLUCOSE INTOLERANCE 02/11/2007  . HYPERLIPIDEMIA 02/11/2007  . 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 Other specified forms of hearing loss 09/05/2009  . HYPERTENSION 02/11/2007  . Atrial fibrillation 03/07/2009  . GERD 02/11/2007  . LOW BACK PAIN, CHRONIC 11/25/2006  . BACK PAIN 12/10/2007  . Pain in Soft Tissues of Limb 02/11/2007  . OSTEOPENIA 02/11/2007  . FATIGUE 12/10/2007  . GAIT DISTURBANCE 12/10/2007  . DYSPNEA 03/18/2009  . Cough 03/07/2009  . CHEST PAIN 12/10/2007  . Abdominal pain, left lower quadrant 04/01/2007  . BREAST CANCER, HX OF 11/25/2006  . Nocturia 05/18/2010  . Hypersomnia 05/18/2010  . Impaired glucose tolerance 05/18/2010  . Allergic rhinitis, cause unspecified 07/23/2012     MEDICATIONS:  Prior to Admission medications   Medication Sig Start Date End Date Taking? Authorizing Provider  apixaban (ELIQUIS) 5 MG TABS tablet Take 5 mg by mouth 2 (two) times daily.    Yes Historical Provider, MD  diltiazem (CARDIZEM CD) 240 MG 24 hr capsule Take 240 mg by mouth daily.  08/13/12  Yes Historical Provider, MD  exemestane (AROMASIN) 25 MG tablet Take 1 tablet (25 mg total) by mouth daily. 08/19/12  Yes Amy Allegra Grana, PA-C  furosemide (LASIX) 20 MG tablet Take 20 mg by mouth daily.   Yes Historical Provider, MD  gabapentin (NEURONTIN) 100 MG capsule Take 100 mg by mouth 3 (three) times daily.   Yes Historical Provider, MD  HYDROcodone-acetaminophen (NORCO) 10-325 MG per tablet Take 1 tablet by mouth every 6 (six) hours as needed for pain.   Yes Historical Provider, MD  losartan (COZAAR) 50 MG tablet Take 50 mg by mouth daily.   Yes Historical Provider, MD  omeprazole (PRILOSEC) 20 MG capsule Take 20 mg by mouth daily.   Yes Historical Provider, MD  potassium chloride (K-DUR,KLOR-CON) 10 MEQ tablet Take 10 mEq by mouth daily.   Yes Historical Provider, MD  temazepam (RESTORIL) 30 MG capsule Take 30 mg by mouth at bedtime as needed for sleep.   Yes Historical Provider, MD    ALLERGIES:  No Known Allergies  SOCIAL HISTORY:  History  Substance Use Topics  . Smoking status: Former Games developer  . Smokeless tobacco: Never Used     Comment: quit 40 years ago  . Alcohol Use:  No    FAMILY HISTORY: Family History  Problem Relation Age of Onset  . Hypertension Brother   . Lung cancer Brother   . Colon cancer Father   . COPD Brother   . Stroke Mother     EXAM: BP 195/84  Pulse 98  Temp(Src) 98.3 F (36.8 C) (Oral)  Resp 20  SpO2 97% CONSTITUTIONAL: Alert and oriented and responds appropriately to questions. Well-appearing; well-nourished; GCS 15 HEAD: Normocephalic; atraumatic EYES: Conjunctivae clear, PERRL, EOMI ENT: normal nose; no rhinorrhea; moist mucous  membranes; pharynx without lesions noted; no dental injury; no septal hematoma NECK: Supple, no meningismus, no LAD; no midline spinal tenderness, step-off or deformity CARD: RRR; S1 and S2 appreciated; no murmurs, no clicks, no rubs, no gallops RESP: Normal chest excursion without splinting or tachypnea; breath sounds clear and equal bilaterally; no wheezes, no rhonchi, no rales; chest wall stable, nontender to palpation ABD/GI: Normal bowel sounds; non-distended; soft, non-tender, no rebound, no guarding PELVIS:  stable, nontender to palpation BACK:  The back appears normal and is diffusely tender to palpation, no spinal step-off or deformity, no lesions noted, patient is tender to palpation over her left posterior ribs without ecchymosis or crepitus EXT: Normal ROM in all joints; non-tender to palpation; no edema; normal capillary refill; no cyanosis    SKIN: Normal color for age and race; warm NEURO: Moves all extremities equally; cranial nerves II through XII intact, sensation to light touch intact diffusely, no drift PSYCH: The patient's mood and manner are appropriate. Grooming and personal hygiene are appropriate.  MEDICAL DECISION MAKING: Patient with shortness of breath and diffuse pain after a mechanical fall at home 2 weeks ago. Given she is on Eliquis although neurologically intact, will obtain CT imaging of her head. Will also obtain imaging of her spine given she is having diffuse pain as well as her bilateral shoulders and left ribs. Suspect her shortness of breath is secondary to pain. She is no hypoxia, respiratory distress. Her lungs are clear. Patient does have risk factors for pulmonary embolus but given her normal vital signs and pain that started after her fall, I feel that trauma is most likely the cause of her symptoms.  If workup is unremarkable and pain is controlled, we'll discharge him with close outpatient followup. Patient and her daughter at bedside verbalize understanding  and are comfortable with this plan.  ED PROGRESS: Labs are unremarkable. Troponin negative. CT head and cervical spine negative. X-rays are pending. Patient reports her pain is improved after IV morphine and with that her shortness of breath has resolved.  X-ray showed no acute fracture or dislocation. She does have arthritic changes. She still reports that she is feeling well and asymptomatic now. We'll discharge home with return precautions. She has primary care for followup. She does take Vicodin regularly for pain. We'll give her oxycodone for breakthrough pain. Patient and daughter at bedside verbalize understanding and are comfortable with this plan.   EKG Interpretation    Date/Time:  Thursday December 18 2012 14:18:33 EST Ventricular Rate:  69 PR Interval:  174 QRS Duration: 95 QT Interval:  443 QTC Calculation: 475 R Axis:   72 Text Interpretation:  Sinus rhythm Resolved atrial fibrillation Unchanged compared to prior ECG on 07/03/2001 Confirmed by Gabriella Guile  DO, Amia Rynders ([id number]) on 12/18/2012 2:36:22 PM             Layla Maw Marqui Formby, DO 12/18/12 1653

## 2012-12-18 NOTE — ED Notes (Signed)
Patient reports a fall that occurred 2 weeks. Patient fell in the bathroom hitting her shoulder and ribs. Patient states she has had shortness of breath since the fall, but has gotten worse recently.

## 2013-01-05 ENCOUNTER — Telehealth: Payer: Self-pay | Admitting: Internal Medicine

## 2013-01-05 DIAGNOSIS — G894 Chronic pain syndrome: Secondary | ICD-10-CM

## 2013-01-05 NOTE — Telephone Encounter (Signed)
Requesting rx for Hydrocodone.  Patient is completely out.

## 2013-01-06 MED ORDER — HYDROCODONE-ACETAMINOPHEN 10-325 MG PO TABS: 1.0000 | ORAL_TABLET | Freq: Four times a day (QID) | ORAL | Status: DC | PRN

## 2013-01-06 NOTE — Telephone Encounter (Signed)
Done hardcopy to robin  Referral done, but there is no guarantee as pain clinic will not take all patient referrals

## 2013-01-06 NOTE — Telephone Encounter (Signed)
Done hardcopy to Rose Sullivan, also to let pt know  You are given the letter today explaining the transitional pain medication refill policy due to recent change in [place] law, and [place] [facility] Board Regulations  Please be aware that I will no longer be able to offer monthly refills of any Schedule II or higher medication starting Mar 01, 2013

## 2013-01-06 NOTE — Telephone Encounter (Signed)
Patients daughter informed

## 2013-01-06 NOTE — Telephone Encounter (Signed)
Called the daughter informed hardcopy is ready for pickup. Also explained enclosed letter regarding pain medication refill change.  She would like a pain clinic referral and ALSO Hardcopy for next month pre dated please

## 2013-01-20 ENCOUNTER — Other Ambulatory Visit: Payer: Self-pay | Admitting: Internal Medicine

## 2013-01-20 NOTE — Telephone Encounter (Signed)
Done hardcopy to robin  

## 2013-01-21 NOTE — Telephone Encounter (Signed)
Faxed hardcopy to [facility] [address] [place] 

## 2013-01-28 ENCOUNTER — Telehealth: Payer: Self-pay | Admitting: *Deleted

## 2013-01-28 NOTE — Telephone Encounter (Signed)
Lm informed the pt that GCM will be on pal 03/19/13. gv appt for 03/04/13@ 4:40pm. Made pt aware that i will mail a letter/avs...td

## 2013-02-02 ENCOUNTER — Encounter: Payer: Self-pay | Admitting: Physical Medicine & Rehabilitation

## 2013-02-04 ENCOUNTER — Encounter: Payer: Self-pay | Admitting: Internal Medicine

## 2013-02-04 ENCOUNTER — Ambulatory Visit (INDEPENDENT_AMBULATORY_CARE_PROVIDER_SITE_OTHER): Payer: Medicare Other | Admitting: Internal Medicine

## 2013-02-04 VITALS — BP 130/68 | HR 96 | Temp 97.3°F | Resp 12 | Wt 190.6 lb

## 2013-02-04 DIAGNOSIS — F411 Generalized anxiety disorder: Secondary | ICD-10-CM | POA: Insufficient documentation

## 2013-02-04 DIAGNOSIS — I10 Essential (primary) hypertension: Secondary | ICD-10-CM

## 2013-02-04 DIAGNOSIS — Z23 Encounter for immunization: Secondary | ICD-10-CM

## 2013-02-04 DIAGNOSIS — H9190 Unspecified hearing loss, unspecified ear: Secondary | ICD-10-CM

## 2013-02-04 DIAGNOSIS — H9193 Unspecified hearing loss, bilateral: Secondary | ICD-10-CM

## 2013-02-04 DIAGNOSIS — R7302 Impaired glucose tolerance (oral): Secondary | ICD-10-CM

## 2013-02-04 DIAGNOSIS — R7309 Other abnormal glucose: Secondary | ICD-10-CM

## 2013-02-04 HISTORY — DX: Generalized anxiety disorder: F41.1

## 2013-02-04 MED ORDER — CITALOPRAM HYDROBROMIDE 10 MG PO TABS: 10.0000 mg | ORAL_TABLET | Freq: Every day | ORAL | Status: DC

## 2013-02-04 NOTE — Patient Instructions (Signed)
Your left ear was irrigated of wax today You had the new Prevnar pneumonia vaccine today Please take all new medication as prescribed  - the citalopram for nerves Please continue all other medications as before, and refills have been done if requested. Please have the pharmacy call with any other refills you may need. Please keep your appointments with your specialists as you have planned  - pain management  No blood work needed today  Please return in 6 months, or sooner if needed

## 2013-02-04 NOTE — Assessment & Plan Note (Signed)
Left this time only, improved with irrigation

## 2013-02-04 NOTE — Assessment & Plan Note (Signed)
stable overall by history and exam, recent data reviewed with pt, and pt to continue medical treatment as before,  to f/u any worsening symptoms or concerns BP Readings from Last 3 Encounters:  02/04/13 130/68  12/18/12 161/84  11/05/12 141/75

## 2013-02-04 NOTE — Assessment & Plan Note (Signed)
[place] for citalopram 10 qd,  to f/u any worsening symptoms or concerns

## 2013-02-04 NOTE — Assessment & Plan Note (Signed)
stable overall by history and exam, recent data reviewed with pt, and pt to continue medical treatment as before,  to f/u any worsening symptoms or concerns Lab Results  Component Value Date   HGBA1C 5.8 04/18/2012

## 2013-02-04 NOTE — Progress Notes (Signed)
Subjective:    Patient ID: Rose Sullivan, female    DOB: [birth year], 80 y.o.   MRN: [id number]  HPI  Here with daughter -  Has 1 wk worsening left hearing loss - ? Wax impaction as before.  Has pain management appt feb 14.  Overall pain stable for now.  Denies worsening depressive symptoms, suicidal ideation, or panic; has ongoing anxiety, increased recently, asks for tx such as SSRI.   Pt denies polydipsia, polyuria, Pt states overall good compliance with meds, trying to follow lower cholesterol diet, wt overall up several lbs with little activity Past Medical History  Diagnosis Date  . GLUCOSE INTOLERANCE 02/11/2007  . HYPERLIPIDEMIA 02/11/2007  . BLINDNESS, [place], [place] DEFINITION 11/25/2006  . Other specified forms of hearing loss 09/05/2009  . HYPERTENSION 02/11/2007  . Atrial fibrillation 03/07/2009  . GERD 02/11/2007  . LOW BACK PAIN, CHRONIC 11/25/2006  . BACK PAIN 12/10/2007  . Pain in Soft Tissues of Limb 02/11/2007  . OSTEOPENIA 02/11/2007  . FATIGUE 12/10/2007  . GAIT DISTURBANCE 12/10/2007  . DYSPNEA 03/18/2009  . Cough 03/07/2009  . CHEST PAIN 12/10/2007  . Abdominal pain, left lower quadrant 04/01/2007  . BREAST CANCER, HX OF 11/25/2006  . Nocturia 05/18/2010  . Hypersomnia 05/18/2010  . Impaired glucose tolerance 05/18/2010  . Allergic rhinitis, cause unspecified 07/23/2012   Past Surgical History  Procedure Laterality Date  . Dilation and curettage of uterus    . S/p mastectomy    . Appendectomy      reports that she has quit smoking. She has never used smokeless tobacco. She reports that she does not drink alcohol or use illicit drugs. family history includes COPD in her brother; Colon cancer in her father; Hypertension in her brother; Lung cancer in her brother; Stroke in her mother. No Known Allergies Current Outpatient Prescriptions on File Prior to Visit  Medication Sig Dispense Refill  . apixaban (ELIQUIS) 5 MG TABS tablet Take 5 mg by mouth 2 (two) times daily.         Marland Kitchen diltiazem (CARDIZEM CD) 240 MG 24 hr capsule Take 240 mg by mouth daily.       Marland Kitchen exemestane (AROMASIN) 25 MG tablet Take 1 tablet (25 mg total) by mouth daily.  90 tablet  3  . furosemide (LASIX) 20 MG tablet Take 20 mg by mouth daily.      Marland Kitchen gabapentin (NEURONTIN) 100 MG capsule Take 100 mg by mouth 3 (three) times daily.      Marland Kitchen HYDROcodone-acetaminophen (NORCO) 10-325 MG per tablet Take 1 tablet by mouth every 6 (six) hours as needed. To fill jan 8,2015  60 tablet  0  . losartan (COZAAR) 50 MG tablet Take 50 mg by mouth daily.      Marland Kitchen omeprazole (PRILOSEC) 20 MG capsule Take 20 mg by mouth daily.      . potassium chloride (K-DUR,KLOR-CON) 10 MEQ tablet Take 10 mEq by mouth daily.      . temazepam (RESTORIL) 30 MG capsule TAKE ONE CAPSULE BY MOUTH EVERY DAY AT BEDTIME AS NEEDED FOR SLEEP  30 capsule  2   No current facility-administered medications on file prior to visit.     Review of Systems  Constitutional: Negative for unexpected weight change, or unusual diaphoresis  HENT: Negative for tinnitus.   Eyes: Negative for photophobia and visual disturbance.  Respiratory: Negative for choking and stridor.   Gastrointestinal: Negative for vomiting and blood in stool.  Genitourinary: Negative for hematuria and  decreased urine volume.  Musculoskeletal: Negative for acute joint swelling Skin: Negative for color change and wound.  Neurological: Negative for tremors and numbness other than noted  Psychiatric/Behavioral: Negative for decreased concentration or  hyperactivity.       Objective:   Physical Exam BP 130/68  Pulse 96  Temp(Src) 97.3 F (36.3 C) (Oral)  Resp 12  Wt 190 lb 9.6 oz (86.456 kg)  SpO2 96% VS noted,  Constitutional: Pt appears well-developed and well-nourished.  HENT: Head: NCAT.  Right Ear: External ear normal.  Left Ear: External ear normal. left wax impaction resolved with irrigation Eyes: Conjunctivae and EOM are normal. Pupils are equal, round, and  reactive to light.  Neck: Normal range of motion. Neck supple.  Cardiovascular: Normal rate and regular rhythm.   Pulmonary/Chest: Effort normal and breath sounds normal.  Neurological: Pt is alert. Not confused  Skin: Skin is warm. No erythema.  Psychiatric: Pt behavior is normal. Thought content normal.      Assessment & Plan:

## 2013-02-26 ENCOUNTER — Other Ambulatory Visit: Payer: Medicare Other

## 2013-03-02 ENCOUNTER — Telehealth: Payer: Self-pay | Admitting: Internal Medicine

## 2013-03-02 NOTE — Telephone Encounter (Signed)
Relevant patient education mailed to patient.  

## 2013-03-04 ENCOUNTER — Ambulatory Visit (HOSPITAL_BASED_OUTPATIENT_CLINIC_OR_DEPARTMENT_OTHER): Payer: Medicare Other | Admitting: Oncology

## 2013-03-04 VITALS — BP 151/88 | HR 87 | Temp 98.5°F | Resp 18 | Wt 200.9 lb

## 2013-03-04 DIAGNOSIS — C779 Secondary and unspecified malignant neoplasm of lymph node, unspecified: Secondary | ICD-10-CM

## 2013-03-04 DIAGNOSIS — C7800 Secondary malignant neoplasm of unspecified lung: Secondary | ICD-10-CM

## 2013-03-04 DIAGNOSIS — M899 Disorder of bone, unspecified: Secondary | ICD-10-CM

## 2013-03-04 DIAGNOSIS — M949 Disorder of cartilage, unspecified: Secondary | ICD-10-CM

## 2013-03-04 DIAGNOSIS — R5381 Other malaise: Secondary | ICD-10-CM

## 2013-03-04 DIAGNOSIS — R0602 Shortness of breath: Secondary | ICD-10-CM

## 2013-03-04 DIAGNOSIS — I10 Essential (primary) hypertension: Secondary | ICD-10-CM

## 2013-03-04 DIAGNOSIS — C50919 Malignant neoplasm of unspecified site of unspecified female breast: Secondary | ICD-10-CM

## 2013-03-04 DIAGNOSIS — R7302 Impaired glucose tolerance (oral): Secondary | ICD-10-CM

## 2013-03-04 DIAGNOSIS — H548 Legal blindness, as defined in USA: Secondary | ICD-10-CM

## 2013-03-04 DIAGNOSIS — M5412 Radiculopathy, cervical region: Secondary | ICD-10-CM

## 2013-03-04 DIAGNOSIS — Z170 Estrogen receptor positive status [ER+]: Secondary | ICD-10-CM

## 2013-03-04 DIAGNOSIS — I4891 Unspecified atrial fibrillation: Secondary | ICD-10-CM

## 2013-03-04 DIAGNOSIS — R5383 Other fatigue: Secondary | ICD-10-CM
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
## 2013-03-05 ENCOUNTER — Telehealth: Payer: Self-pay | Admitting: *Deleted

## 2013-03-05 ENCOUNTER — Other Ambulatory Visit: Payer: Medicare Other

## 2013-03-05 NOTE — Telephone Encounter (Signed)
Lm igv appt for mammo for 03/20/13@ 2:20pm and for 09/09/13 w/ labs@1 :45pm and ov@ 2:15pm. Made the pt aware that i will mail a letter/avs....td

## 2013-03-09 ENCOUNTER — Encounter: Payer: Self-pay | Admitting: Physical Medicine & Rehabilitation

## 2013-03-09 ENCOUNTER — Encounter: Payer: Medicare Other | Attending: Physical Medicine & Rehabilitation | Admitting: Physical Medicine & Rehabilitation

## 2013-03-09 VITALS — BP 180/77 | HR 80 | Resp 16 | Ht 60.0 in | Wt 203.0 lb

## 2013-03-09 DIAGNOSIS — Z9010 Acquired absence of unspecified breast and nipple: Secondary | ICD-10-CM | POA: Insufficient documentation

## 2013-03-09 DIAGNOSIS — M1712 Unilateral primary osteoarthritis, left knee: Secondary | ICD-10-CM | POA: Insufficient documentation

## 2013-03-09 DIAGNOSIS — G609 Hereditary and idiopathic neuropathy, unspecified: Secondary | ICD-10-CM

## 2013-03-09 DIAGNOSIS — Z8673 Personal history of transient ischemic attack (TIA), and cerebral infarction without residual deficits: Secondary | ICD-10-CM | POA: Insufficient documentation

## 2013-03-09 DIAGNOSIS — M47816 Spondylosis without myelopathy or radiculopathy, lumbar region: Secondary | ICD-10-CM

## 2013-03-09 DIAGNOSIS — M79609 Pain in unspecified limb: Secondary | ICD-10-CM | POA: Insufficient documentation

## 2013-03-09 DIAGNOSIS — G894 Chronic pain syndrome: Secondary | ICD-10-CM

## 2013-03-09 DIAGNOSIS — IMO0002 Reserved for concepts with insufficient information to code with codable children: Secondary | ICD-10-CM

## 2013-03-09 DIAGNOSIS — M1710 Unilateral primary osteoarthritis, unspecified knee: Secondary | ICD-10-CM

## 2013-03-09 DIAGNOSIS — Z5181 Encounter for therapeutic drug level monitoring: Secondary | ICD-10-CM

## 2013-03-09 DIAGNOSIS — M129 Arthropathy, unspecified: Secondary | ICD-10-CM | POA: Insufficient documentation

## 2013-03-09 DIAGNOSIS — M25559 Pain in unspecified hip: Secondary | ICD-10-CM | POA: Insufficient documentation

## 2013-03-09 DIAGNOSIS — Z853 Personal history of malignant neoplasm of breast: Secondary | ICD-10-CM | POA: Insufficient documentation

## 2013-03-09 DIAGNOSIS — G8929 Other chronic pain: Secondary | ICD-10-CM | POA: Insufficient documentation

## 2013-03-09 DIAGNOSIS — I10 Essential (primary) hypertension: Secondary | ICD-10-CM | POA: Insufficient documentation

## 2013-03-09 DIAGNOSIS — M1611 Unilateral primary osteoarthritis, right hip: Secondary | ICD-10-CM

## 2013-03-09 DIAGNOSIS — H548 Legal blindness, as defined in USA: Secondary | ICD-10-CM | POA: Insufficient documentation

## 2013-03-09 DIAGNOSIS — M159 Polyosteoarthritis, unspecified: Secondary | ICD-10-CM | POA: Insufficient documentation

## 2013-03-09 DIAGNOSIS — M545 Low back pain, unspecified: Secondary | ICD-10-CM | POA: Insufficient documentation

## 2013-03-09 DIAGNOSIS — M25569 Pain in unspecified knee: Secondary | ICD-10-CM | POA: Insufficient documentation

## 2013-03-09 DIAGNOSIS — Z79899 Other long term (current) drug therapy: Secondary | ICD-10-CM

## 2013-03-09 DIAGNOSIS — M47817 Spondylosis without myelopathy or radiculopathy, lumbosacral region: Secondary | ICD-10-CM

## 2013-03-09 DIAGNOSIS — M1711 Unilateral primary osteoarthritis, right knee: Secondary | ICD-10-CM | POA: Insufficient documentation

## 2013-03-09 DIAGNOSIS — M169 Osteoarthritis of hip, unspecified: Secondary | ICD-10-CM

## 2013-03-09 DIAGNOSIS — K219 Gastro-esophageal reflux disease without esophagitis: Secondary | ICD-10-CM | POA: Insufficient documentation

## 2013-03-09 DIAGNOSIS — M1610 Unilateral primary osteoarthritis, unspecified hip: Secondary | ICD-10-CM

## 2013-03-09 DIAGNOSIS — G608 Other hereditary and idiopathic neuropathies: Secondary | ICD-10-CM | POA: Insufficient documentation

## 2013-03-09 DIAGNOSIS — E785 Hyperlipidemia, unspecified: Secondary | ICD-10-CM | POA: Insufficient documentation

## 2013-03-09 MED ORDER — DICLOFENAC SODIUM 1 % TD GEL: 1.0000 "application " | Freq: Four times a day (QID) | TRANSDERMAL | Status: DC

## 2013-03-09 NOTE — Patient Instructions (Signed)
PLEASE CALL ME WITH ANY PROBLEMS OR QUESTIONS (#[phone]).      

## 2013-03-09 NOTE — Progress Notes (Signed)
Subjective:    Patient ID: Rose Sullivan, female    DOB: 1933/04/24, 80 y.o.   MRN: [id number]  HPI  This is an initial visit for Rose Sullivan who is here for complaints of chronic pain in her right hip and knees. She is also complaining of pain in her low back and feet. She feels that the pain is increasing over the last few years.   Dr. Jenny Reichmann has been giving her hydrocodone for pain control which she usually takes twice a day. She has also been prescribed gabapentin which she really doesn't take, which may be for neuropathy. She describes that she has tingling and pain in both her hands and feet.   She has seen orthopedic surgery regarding her right hip and knee who felt that she needed to lose some weight prior to considering replacement  Her low back has been a chronic source of complaint as well. She has had MRI's done in 2012 which showed diffuse degenerative changes and facet disease.   Regarding her weight, she has been unsuccessful with exercise and diet. She is legally blind.     Pain Inventory Average Pain 8 Pain Right Now 2 My pain is sharp  In the last 24 hours, has pain interfered with the following? General activity 8 Relation with others 8 Enjoyment of life 8 What TIME of day is your pain at its worst? all the time Sleep (in general) Poor  Pain is worse with: unsure Pain improves with: medication Relief from Meds: 7  Mobility walk with assistance ability to climb steps?  yes do you drive?  no use a wheelchair needs help with transfers  Function retired I need assistance with the following:  feeding, dressing, bathing, meal prep, household duties and shopping Do you have any goals in this area?  no  Neuro/Psych bladder control problems numbness tingling trouble walking dizziness  Prior Studies bone scan x-rays CT/MRI nerve study  Physicians involved in your care [name]   Family History  Problem Relation Age of Onset  .  Hypertension Brother   . Lung cancer Brother   . Colon cancer Father   . COPD Brother   . Stroke Mother    History   Social History  . Marital Status: Widowed    Spouse Name: N/A    Number of Children: 1  . Years of Education: N/A   Occupational History  .     Social History Main Topics  . Smoking status: Former Research scientist (life sciences)  . Smokeless tobacco: Never Used     Comment: quit 40 years ago  . Alcohol Use: No  . Drug Use: No  . Sexual Activity: None   Other Topics Concern  . None   Social History Narrative   Lives with daughter   Past Surgical History  Procedure Laterality Date  . Dilation and curettage of uterus    . S/p mastectomy    . Appendectomy     Past Medical History  Diagnosis Date  . GLUCOSE INTOLERANCE 02/11/2007  . HYPERLIPIDEMIA 02/11/2007  . BLINDNESS, [place], [place] DEFINITION 11/25/2006  . Other specified forms of hearing loss 09/05/2009  . HYPERTENSION 02/11/2007  . Atrial fibrillation 03/07/2009  . GERD 02/11/2007  . LOW BACK PAIN, CHRONIC 11/25/2006  . BACK PAIN 12/10/2007  . Pain in Soft Tissues of Limb 02/11/2007  . OSTEOPENIA 02/11/2007  . FATIGUE 12/10/2007  . GAIT DISTURBANCE 12/10/2007  . DYSPNEA 03/18/2009  . Cough 03/07/2009  . CHEST PAIN 12/10/2007  .  Abdominal pain, left lower quadrant 04/01/2007  . BREAST CANCER, HX OF 11/25/2006  . Nocturia 05/18/2010  . Hypersomnia 05/18/2010  . Impaired glucose tolerance 05/18/2010  . Allergic rhinitis, cause unspecified 07/23/2012  . Anxiety state, unspecified 02/04/2013   BP 180/77  Pulse 80  Resp 16  Ht 5' (1.524 m)  Wt 203 lb (92.08 kg)  BMI 39.65 kg/m2  SpO2 96%  Opioid Risk Score: 0 Fall Risk Score: High Fall Risk (>13 points) (patient educated handout given)   Review of Systems  Respiratory: Positive for shortness of breath.   Genitourinary: Positive for frequency and difficulty urinating.  Neurological: Positive for dizziness and numbness.  All other systems reviewed and are negative.         Objective:   Physical Exam   General: Alert and oriented x 3, No apparent distress. Morbidly obese. HEENT: Head is normocephalic, atraumatic, PERRLA, EOMI, sclera anicteric, oral mucosa pink and moist, dentition intact, ext ear canals clear,  Neck: Supple without JVD or lymphadenopathy Heart: Reg rate and rhythm. No murmurs rubs or gallops Chest: CTA bilaterally without wheezes, rales, or rhonchi; no distress Abdomen: Soft, non-tender, non-distended, bowel sounds positive. Extremities: No clubbing, cyanosis, or edema. Pulses are 2+ Skin: Clean and intact without signs of breakdown Neuro: Pt able to track simple objects, primarily using her right eye. Gaze dysconjugate. Sensation was decreased to PP and LT in both hands to the wrists and both feet up to the knees in a stocking glove distribution. dtr's are 1+. Motor function is grossly 5/5.  Musculoskeletal: crepitus in both knees. Pain with meniscal maneuvers. Patrick's maneuver positive for right hip and knee pain. Low back was tender with rom in all planes. She had general discomfort with palpation but it was difficult to appreciate any focal muscle spasm due her size and position in the wheelchair. She sits with a head forward position with some thoracic kyphosis noticeable.Marland Kitchen  Psych: Pt's affect is anxious, distracted. Pt is a poor historian and difficult to keep on task.        Assessment & Plan:  1. Osteoarthritis of bilateral knees, right greater than left. 2. Osteoarthritis right hip 3. Facet arthropathy of lumbar and cervical spine 4. Peripheral polyneuropathy, quite possibly secondary to her chemotherapy 5. Morbid obesity  6. Legally blind   Plan: 1. A UDS was collected today. Once this returns consistent, I would be willing to rx her hydrocodone 10/325 Which she is taking 2 of per day 2. Restart gabapentin 100mg  TID (daughter states they have a supply at home). This may help substantially with her neuropathy related  pain 3. Voltaren gel to hands, knees which is a safe alternative considering that she's on eliquis 4. Continued diet and weight loss are recommended. Her mobility is likely to be limited by vision and other behavioral factors 5. Follow up with me in about a month. 30 minutes of face to face patient care time were spent during this visit. All questions were encouraged and answered.

## 2013-03-10 ENCOUNTER — Telehealth: Payer: Self-pay | Admitting: Internal Medicine

## 2013-03-10 MED ORDER — HYDROCODONE-ACETAMINOPHEN 10-325 MG PO TABS
1.0000 | ORAL_TABLET | Freq: Four times a day (QID) | ORAL | Status: DC | PRN
Start: 2013-03-10 — End: 2013-04-14

## 2013-03-10 NOTE — Telephone Encounter (Signed)
Patients daughter informed to pickup hardcopy at the front desk.

## 2013-03-10 NOTE — Telephone Encounter (Signed)
Done hardcopy to robin  

## 2013-03-10 NOTE — Telephone Encounter (Signed)
Message copied by Biagio Borg on Tue Mar 10, 2013  1:06 PM ------      Message from: Alger Simons T      Created: Mon Mar 09, 2013 12:46 PM       J            Mrs. Vanderlinden will need a hydrocodone rx for February until we receive her UDS---if it is consistent I will fill in the future. We don't rx narcotics on initial evaluations,            Thanks            zach ------

## 2013-03-18 ENCOUNTER — Telehealth: Payer: Self-pay | Admitting: *Deleted

## 2013-03-18 NOTE — Telephone Encounter (Signed)
Left voicemail to return a call to our clinic Her daughter who is listed as a contact called [place] back.  Her mother lives with her and she gives her medication.  i was calling about her urine drug screen showing evidence of Codeine/morphine consumption and I needed to know what she has taken.  She says that her mother had a prescription for tylenol # 3 and had some left over and she gave her some to help bridge until a new hydrocodone rx could be obtained from Dr Jenny Reichmann. I will let Dr Naaman Plummer know.

## 2013-03-19 ENCOUNTER — Ambulatory Visit: Payer: Medicare Other | Admitting: Oncology

## 2013-03-20 ENCOUNTER — Inpatient Hospital Stay: Admission: RE | Admit: 2013-03-20 | Payer: Medicare Other | Source: Ambulatory Visit

## 2013-04-03 ENCOUNTER — Ambulatory Visit: Payer: Medicare Other

## 2013-04-14 ENCOUNTER — Encounter: Payer: Medicare Other | Attending: Physical Medicine & Rehabilitation | Admitting: Physical Medicine & Rehabilitation

## 2013-04-14 ENCOUNTER — Encounter: Payer: Self-pay | Admitting: Physical Medicine & Rehabilitation

## 2013-04-14 VITALS — BP 135/74 | HR 74 | Resp 14 | Ht 60.0 in | Wt 200.0 lb

## 2013-04-14 DIAGNOSIS — M169 Osteoarthritis of hip, unspecified: Secondary | ICD-10-CM

## 2013-04-14 DIAGNOSIS — M545 Low back pain, unspecified: Secondary | ICD-10-CM | POA: Insufficient documentation

## 2013-04-14 DIAGNOSIS — M79609 Pain in unspecified limb: Secondary | ICD-10-CM | POA: Insufficient documentation

## 2013-04-14 DIAGNOSIS — M25569 Pain in unspecified knee: Secondary | ICD-10-CM | POA: Insufficient documentation

## 2013-04-14 DIAGNOSIS — M1611 Unilateral primary osteoarthritis, right hip: Secondary | ICD-10-CM

## 2013-04-14 DIAGNOSIS — M129 Arthropathy, unspecified: Secondary | ICD-10-CM | POA: Insufficient documentation

## 2013-04-14 DIAGNOSIS — G8929 Other chronic pain: Secondary | ICD-10-CM | POA: Insufficient documentation

## 2013-04-14 DIAGNOSIS — M159 Polyosteoarthritis, unspecified: Secondary | ICD-10-CM | POA: Insufficient documentation

## 2013-04-14 DIAGNOSIS — G608 Other hereditary and idiopathic neuropathies: Secondary | ICD-10-CM | POA: Insufficient documentation

## 2013-04-14 DIAGNOSIS — I10 Essential (primary) hypertension: Secondary | ICD-10-CM | POA: Insufficient documentation

## 2013-04-14 DIAGNOSIS — Z853 Personal history of malignant neoplasm of breast: Secondary | ICD-10-CM | POA: Insufficient documentation

## 2013-04-14 DIAGNOSIS — E785 Hyperlipidemia, unspecified: Secondary | ICD-10-CM | POA: Insufficient documentation

## 2013-04-14 DIAGNOSIS — M47816 Spondylosis without myelopathy or radiculopathy, lumbar region: Secondary | ICD-10-CM

## 2013-04-14 DIAGNOSIS — Z8673 Personal history of transient ischemic attack (TIA), and cerebral infarction without residual deficits: Secondary | ICD-10-CM | POA: Insufficient documentation

## 2013-04-14 DIAGNOSIS — M1711 Unilateral primary osteoarthritis, right knee: Secondary | ICD-10-CM

## 2013-04-14 DIAGNOSIS — H548 Legal blindness, as defined in USA: Secondary | ICD-10-CM | POA: Insufficient documentation

## 2013-04-14 DIAGNOSIS — M47817 Spondylosis without myelopathy or radiculopathy, lumbosacral region: Secondary | ICD-10-CM

## 2013-04-14 DIAGNOSIS — M1610 Unilateral primary osteoarthritis, unspecified hip: Secondary | ICD-10-CM

## 2013-04-14 DIAGNOSIS — M1710 Unilateral primary osteoarthritis, unspecified knee: Secondary | ICD-10-CM

## 2013-04-14 DIAGNOSIS — Z9010 Acquired absence of unspecified breast and nipple: Secondary | ICD-10-CM | POA: Insufficient documentation

## 2013-04-14 DIAGNOSIS — M1712 Unilateral primary osteoarthritis, left knee: Secondary | ICD-10-CM

## 2013-04-14 DIAGNOSIS — K219 Gastro-esophageal reflux disease without esophagitis: Secondary | ICD-10-CM | POA: Insufficient documentation

## 2013-04-14 DIAGNOSIS — IMO0002 Reserved for concepts with insufficient information to code with codable children: Secondary | ICD-10-CM

## 2013-04-14 DIAGNOSIS — M25559 Pain in unspecified hip: Secondary | ICD-10-CM | POA: Insufficient documentation

## 2013-04-14 MED ORDER — HYDROCODONE-ACETAMINOPHEN 10-325 MG PO TABS: 1.0000 | ORAL_TABLET | Freq: Four times a day (QID) | ORAL | Status: DC | PRN

## 2013-04-14 NOTE — Patient Instructions (Signed)
REMEMBER TO KEEP UP WITH YOUR EXERCISES AND GOOD POSTURE WHILE YOU STAND OR SIT!!!!

## 2013-04-14 NOTE — Progress Notes (Signed)
Subjective:    Patient ID: Rose Sullivan, female    DOB: Nov 29, 1933, 80 y.o.   MRN: [id number]  HPI  Mrs. Glock is back regarding her chronic low back pain and poly arthritis as well as her neuropathic pain. The resumption of the gabapentin has been beneficial. She has had no tolerance issues. Her UDS was consistent. She remains on the vicodin bid and this seems to control a lot of her back pain. She also likes the voltaren gel but  Doesn't always use it consistently.   Pain Inventory Average Pain 7 Pain Right Now 2 My pain is other  In the last 24 hours, has pain interfered with the following? General activity 5 Relation with others 5 Enjoyment of life 5 What TIME of day is your pain at its worst? morning, night Sleep (in general) Good  Pain is worse with: walking Pain improves with: rest and medication Relief from Meds: 9  Mobility walk with assistance how many minutes can you walk? 2 ability to climb steps?  no do you drive?  no use a wheelchair needs help with transfers  Function disabled: date disabled na I need assistance with the following:  dressing, bathing, meal prep, household duties and shopping  Neuro/Psych numbness tingling trouble walking  Prior Studies Any changes since last visit?  no  Physicians involved in your care Any changes since last visit?  no   Family History  Problem Relation Age of Onset  . Hypertension Brother   . Lung cancer Brother   . Colon cancer Father   . COPD Brother   . Stroke Mother    History   Social History  . Marital Status: Widowed    Spouse Name: N/A    Number of Children: 1  . Years of Education: N/A   Occupational History  .     Social History Main Topics  . Smoking status: Former Research scientist (life sciences)  . Smokeless tobacco: Never Used     Comment: quit 40 years ago  . Alcohol Use: No  . Drug Use: No  . Sexual Activity: None   Other Topics Concern  . None   Social History Narrative   Lives with  daughter   Past Surgical History  Procedure Laterality Date  . Dilation and curettage of uterus    . S/p mastectomy    . Appendectomy     Past Medical History  Diagnosis Date  . GLUCOSE INTOLERANCE 02/11/2007  . HYPERLIPIDEMIA 02/11/2007  . BLINDNESS, [place], [place] DEFINITION 11/25/2006  . Other specified forms of hearing loss 09/05/2009  . HYPERTENSION 02/11/2007  . Atrial fibrillation 03/07/2009  . GERD 02/11/2007  . LOW BACK PAIN, CHRONIC 11/25/2006  . BACK PAIN 12/10/2007  . Pain in Soft Tissues of Limb 02/11/2007  . OSTEOPENIA 02/11/2007  . FATIGUE 12/10/2007  . GAIT DISTURBANCE 12/10/2007  . DYSPNEA 03/18/2009  . Cough 03/07/2009  . CHEST PAIN 12/10/2007  . Abdominal pain, left lower quadrant 04/01/2007  . BREAST CANCER, HX OF 11/25/2006  . Nocturia 05/18/2010  . Hypersomnia 05/18/2010  . Impaired glucose tolerance 05/18/2010  . Allergic rhinitis, cause unspecified 07/23/2012  . Anxiety state, unspecified 02/04/2013   BP 135/74  Pulse 74  Resp 14  Ht 5' (1.524 m)  Wt 200 lb (90.719 kg)  BMI 39.06 kg/m2  SpO2 97%  Opioid Risk Score:   Fall Risk Score: High Fall Risk (>13 points) (pt educated on fall risk, pt given brochure previously)   Review of Systems  Respiratory: Positive for shortness of breath.   Endocrine:       High blood sugar  Musculoskeletal: Positive for back pain and gait problem.  Neurological: Positive for numbness.       Tingling  Hematological: Bruises/bleeds easily.  All other systems reviewed and are negative.       Objective:   Physical Exam  General: Alert and oriented x 3, No apparent distress. Morbidly obese.  HEENT: Head is normocephalic, atraumatic, PERRLA, EOMI, sclera anicteric, oral mucosa pink and moist, dentition intact, ext ear canals clear,  Neck: Supple without JVD or lymphadenopathy  Heart: Reg rate and rhythm. No murmurs rubs or gallops  Chest: CTA bilaterally without wheezes, rales, or rhonchi; no distress  Abdomen: Soft,  non-tender, non-distended, bowel sounds positive.  Extremities: No clubbing, cyanosis, or edema. Pulses are 2+  Skin: Clean and intact without signs of breakdown  Neuro: Pt able to track simple objects, primarily using her right eye. Gaze dysconjugate. Sensation was decreased to PP and LT in both hands to the wrists and both feet up to the knees in a stocking glove distribution. dtr's are 1+. Motor function is grossly 5/5.  Musculoskeletal: crepitus in both knees. Pain with meniscal maneuvers. Patrick's maneuver positive for right hip and knee pain. Low back was tender with rom . Pt stood up today with supervision. She assumed a flexed position but with cues could stand more neutral/erect.  Psych: Pt's affect is calm and less distracted today.  .   Assessment & Plan:   1. Osteoarthritis of bilateral knees, right greater than left.  2. Osteoarthritis right hip  3. Facet arthropathy of lumbar and cervical spine  4. Peripheral polyneuropathy, quite possibly secondary to past chemotherapy  5. Morbid obesity  6. Legally blind      Plan:  1. Rxed hydrocodone 10/325 one bid prn #60 with second rx for next month. 2. Continue  gabapentin 100mg  TID. If her pain flares at all, they can increase the night time dose to 200mg  3. Voltaren gel to hands, knees /   4. Continued diet and weight loss are recommended. Her mobility is likely to be limited by vision and other behavioral factors. We also discussed basic stretching and posture today at length. The daughter and patient both expressed an understading 5. Follow up with me in about a month. 30 minutes of face to face patient care time were spent during this visit. All questions were encouraged and answered.

## 2013-06-10 ENCOUNTER — Encounter: Payer: Medicare Other | Attending: Physical Medicine & Rehabilitation | Admitting: Physical Medicine & Rehabilitation

## 2013-06-10 ENCOUNTER — Other Ambulatory Visit: Payer: Self-pay | Admitting: Internal Medicine

## 2013-06-10 ENCOUNTER — Encounter: Payer: Self-pay | Admitting: Physical Medicine & Rehabilitation

## 2013-06-10 VITALS — BP 176/82 | HR 82 | Resp 16 | Ht 60.0 in | Wt 200.0 lb

## 2013-06-10 DIAGNOSIS — M159 Polyosteoarthritis, unspecified: Secondary | ICD-10-CM | POA: Insufficient documentation

## 2013-06-10 DIAGNOSIS — M129 Arthropathy, unspecified: Secondary | ICD-10-CM | POA: Insufficient documentation

## 2013-06-10 DIAGNOSIS — M1710 Unilateral primary osteoarthritis, unspecified knee: Secondary | ICD-10-CM

## 2013-06-10 DIAGNOSIS — M25559 Pain in unspecified hip: Secondary | ICD-10-CM | POA: Insufficient documentation

## 2013-06-10 DIAGNOSIS — H548 Legal blindness, as defined in USA: Secondary | ICD-10-CM | POA: Insufficient documentation

## 2013-06-10 DIAGNOSIS — Z9010 Acquired absence of unspecified breast and nipple: Secondary | ICD-10-CM | POA: Insufficient documentation

## 2013-06-10 DIAGNOSIS — IMO0002 Reserved for concepts with insufficient information to code with codable children: Secondary | ICD-10-CM

## 2013-06-10 DIAGNOSIS — M545 Low back pain, unspecified: Secondary | ICD-10-CM | POA: Insufficient documentation

## 2013-06-10 DIAGNOSIS — M1711 Unilateral primary osteoarthritis, right knee: Secondary | ICD-10-CM

## 2013-06-10 DIAGNOSIS — E785 Hyperlipidemia, unspecified: Secondary | ICD-10-CM | POA: Insufficient documentation

## 2013-06-10 DIAGNOSIS — I10 Essential (primary) hypertension: Secondary | ICD-10-CM | POA: Insufficient documentation

## 2013-06-10 DIAGNOSIS — K219 Gastro-esophageal reflux disease without esophagitis: Secondary | ICD-10-CM | POA: Insufficient documentation

## 2013-06-10 DIAGNOSIS — M1610 Unilateral primary osteoarthritis, unspecified hip: Secondary | ICD-10-CM

## 2013-06-10 DIAGNOSIS — M1611 Unilateral primary osteoarthritis, right hip: Secondary | ICD-10-CM

## 2013-06-10 DIAGNOSIS — M169 Osteoarthritis of hip, unspecified: Secondary | ICD-10-CM

## 2013-06-10 DIAGNOSIS — Z853 Personal history of malignant neoplasm of breast: Secondary | ICD-10-CM | POA: Insufficient documentation

## 2013-06-10 DIAGNOSIS — M79609 Pain in unspecified limb: Secondary | ICD-10-CM | POA: Insufficient documentation

## 2013-06-10 DIAGNOSIS — M25569 Pain in unspecified knee: Secondary | ICD-10-CM | POA: Insufficient documentation

## 2013-06-10 DIAGNOSIS — M1712 Unilateral primary osteoarthritis, left knee: Secondary | ICD-10-CM

## 2013-06-10 DIAGNOSIS — G8929 Other chronic pain: Secondary | ICD-10-CM | POA: Insufficient documentation

## 2013-06-10 DIAGNOSIS — Z8673 Personal history of transient ischemic attack (TIA), and cerebral infarction without residual deficits: Secondary | ICD-10-CM | POA: Insufficient documentation

## 2013-06-10 DIAGNOSIS — G608 Other hereditary and idiopathic neuropathies: Secondary | ICD-10-CM | POA: Insufficient documentation

## 2013-06-10 MED ORDER — HYDROCODONE-ACETAMINOPHEN 10-325 MG PO TABS: 1.0000 | ORAL_TABLET | Freq: Four times a day (QID) | ORAL | Status: DC | PRN

## 2013-06-10 NOTE — Progress Notes (Signed)
Subjective:    Patient ID: Rose Sullivan, female    DOB: [birth year], 80 y.o.   MRN: [id number]  HPI  Rose Sullivan is back regarding her knee pain.  Her pain has been generally well controlled. Occasionally she'll need an additional hydrocodone for bad days, typically in the afternoon.   She complains of increased swelling in her knees and legs. Lasix is prescribed but she doesn't like to take it because of the urinary frequency. She does elevated her legs. She is not wearing any stockings.  The voltaren gel seems to help her knees, but the patient won't apply it herself.   Pain Inventory Average Pain 5 Pain Right Now 5 My pain is n/a  In the last 24 hours, has pain interfered with the following? General activity 1 Relation with others 6 Enjoyment of life 5 What TIME of day is your pain at its worst? daytime Sleep (in general) Fair  Pain is worse with: walking, bending and standing Pain improves with: rest and medication Relief from Meds: n/a  Mobility walk with assistance how many minutes can you walk? 3-5 ability to climb steps?  yes do you drive?  no use a wheelchair needs help with transfers Do you have any goals in this area?  no  Function disabled: date disabled . I need assistance with the following:  feeding, dressing, bathing, meal prep, household duties and shopping  Neuro/Psych numbness tingling trouble walking dizziness  Prior Studies Any changes since last visit?  no  Physicians involved in your care Any changes since last visit?  no   Family History  Problem Relation Age of Onset  . Hypertension Brother   . Lung cancer Brother   . Colon cancer Father   . COPD Brother   . Stroke Mother    History   Social History  . Marital Status: Widowed    Spouse Name: N/A    Number of Children: 1  . Years of Education: N/A   Occupational History  .     Social History Main Topics  . Smoking status: Former Research scientist (life sciences)  . Smokeless tobacco:  Never Used     Comment: quit 40 years ago  . Alcohol Use: No  . Drug Use: No  . Sexual Activity: None   Other Topics Concern  . None   Social History Narrative   Lives with daughter   Past Surgical History  Procedure Laterality Date  . Dilation and curettage of uterus    . S/p mastectomy    . Appendectomy     Past Medical History  Diagnosis Date  . GLUCOSE INTOLERANCE 02/11/2007  . HYPERLIPIDEMIA 02/11/2007  . BLINDNESS, [place], [place] DEFINITION 11/25/2006  . Other specified forms of hearing loss 09/05/2009  . HYPERTENSION 02/11/2007  . Atrial fibrillation 03/07/2009  . GERD 02/11/2007  . LOW BACK PAIN, CHRONIC 11/25/2006  . BACK PAIN 12/10/2007  . Pain in Soft Tissues of Limb 02/11/2007  . OSTEOPENIA 02/11/2007  . FATIGUE 12/10/2007  . GAIT DISTURBANCE 12/10/2007  . DYSPNEA 03/18/2009  . Cough 03/07/2009  . CHEST PAIN 12/10/2007  . Abdominal pain, left lower quadrant 04/01/2007  . BREAST CANCER, HX OF 11/25/2006  . Nocturia 05/18/2010  . Hypersomnia 05/18/2010  . Impaired glucose tolerance 05/18/2010  . Allergic rhinitis, cause unspecified 07/23/2012  . Anxiety state, unspecified 02/04/2013   BP 176/82  Pulse 82  Resp 16  Ht 5' (1.524 m)  Wt 200 lb (90.719 kg)  BMI 39.06 kg/m2  SpO2  96%  Opioid Risk Score:   Fall Risk Score: Moderate Fall Risk (6-13 points) (patient educated handout declined)   Review of Systems  Musculoskeletal: Positive for gait problem.  Neurological: Positive for dizziness and numbness.       Tingling  All other systems reviewed and are negative.      Objective:   Physical Exam  General: Alert and oriented x 3, No apparent distress. Morbidly obese.  HEENT: Head is normocephalic, atraumatic, PERRLA, EOMI, sclera anicteric, oral mucosa pink and moist, dentition intact, ext ear canals clear,  Neck: Supple without JVD or lymphadenopathy  Heart: Reg rate and rhythm. No murmurs rubs or gallops  Chest: CTA bilaterally without wheezes, rales, or rhonchi;  no distress  Abdomen: Soft, non-tender, non-distended, bowel sounds positive.  Extremities: No clubbing, cyanosis, trace lower extremity edema. Pulses are 2+  Skin: Clean and intact without signs of breakdown.  Neuro: Pt able to track simple objects, primarily using her right eye. Gaze dysconjugate. Sensation was decreased to PP and LT in both hands to the wrists and both feet up to the knees in a stocking glove distribution. dtr's are 1+. Motor function is grossly 5/5.  Musculoskeletal: crepitus in both knees. Pain with meniscal maneuvers. She remained in her chair during today's exam.   Psych: Pt's affect is calm and pleasant. She was in good spirits.  Assessment & Plan:   1. Osteoarthritis of bilateral knees, right greater than left.  2. Osteoarthritis right hip  3. Facet arthropathy of lumbar and cervical spine  4. Peripheral polyneuropathy, quite possibly secondary to past chemotherapy  5. Morbid obesity  6. Legally blind     Plan:  1. Rxed hydrocodone 10/325 one bid prn #65 with second rx for next month. I gave her an additional 5 tabs today to cover exacerbations or mid day pain.   2. Continue gabapentin 100mg  BID to TID. 3. Voltaren gel to hands, knees. i encouraged her to apply the gel by herself to her knees in order to get the benefits on her hands at the same time 4. Continue dietary measures, basic exercises at home 5. Follow up with my NP in about 2 months.  30 minutes of face to face patient care time were spent during this visit. All questions were encouraged and answered.

## 2013-06-10 NOTE — Patient Instructions (Signed)
TRY SOME KNEE-HIGH TED HOSE FOR CONTROL OF SWELLING.

## 2013-06-11 NOTE — Telephone Encounter (Signed)
Done hardcopy to robin  

## 2013-06-11 NOTE — Telephone Encounter (Signed)
Faxed hardcopy to [facility]

## 2013-08-05 ENCOUNTER — Ambulatory Visit (INDEPENDENT_AMBULATORY_CARE_PROVIDER_SITE_OTHER): Payer: Medicare Other | Admitting: Internal Medicine

## 2013-08-05 ENCOUNTER — Encounter: Payer: Self-pay | Admitting: Internal Medicine

## 2013-08-05 ENCOUNTER — Other Ambulatory Visit (INDEPENDENT_AMBULATORY_CARE_PROVIDER_SITE_OTHER): Payer: Medicare Other

## 2013-08-05 VITALS — BP 110/72 | HR 91 | Temp 97.2°F | Wt 200.0 lb

## 2013-08-05 DIAGNOSIS — R7309 Other abnormal glucose: Secondary | ICD-10-CM

## 2013-08-05 DIAGNOSIS — H9190 Unspecified hearing loss, unspecified ear: Secondary | ICD-10-CM

## 2013-08-05 DIAGNOSIS — I10 Essential (primary) hypertension: Secondary | ICD-10-CM

## 2013-08-05 DIAGNOSIS — C50919 Malignant neoplasm of unspecified site of unspecified female breast: Secondary | ICD-10-CM

## 2013-08-05 DIAGNOSIS — H9193 Unspecified hearing loss, bilateral: Secondary | ICD-10-CM

## 2013-08-05 DIAGNOSIS — E785 Hyperlipidemia, unspecified: Secondary | ICD-10-CM

## 2013-08-05 DIAGNOSIS — R7302 Impaired glucose tolerance (oral): Secondary | ICD-10-CM

## 2013-08-05 DIAGNOSIS — C7800 Secondary malignant neoplasm of unspecified lung: Secondary | ICD-10-CM

## 2013-08-05 LAB — CBC WITH DIFFERENTIAL/PLATELET
BASOS ABS: 0.1 10*3/uL (ref 0.0–0.1)
Basophils Relative: 0.7 % (ref 0.0–3.0)
EOS ABS: 0.2 10*3/uL (ref 0.0–0.7)
Eosinophils Relative: 2.6 % (ref 0.0–5.0)
HCT: 42.8 % (ref 36.0–46.0)
Hemoglobin: 14.6 g/dL (ref 12.0–15.0)
LYMPHS PCT: 25.6 % (ref 12.0–46.0)
Lymphs Abs: 2.1 10*3/uL (ref 0.7–4.0)
MCHC: 34.1 g/dL (ref 30.0–36.0)
MCV: 91.1 fl (ref 78.0–100.0)
MONO ABS: 0.5 10*3/uL (ref 0.1–1.0)
Monocytes Relative: 5.9 % (ref 3.0–12.0)
NEUTROS PCT: 65.2 % (ref 43.0–77.0)
Neutro Abs: 5.3 10*3/uL (ref 1.4–7.7)
PLATELETS: 246 10*3/uL (ref 150.0–400.0)
RBC: 4.7 Mil/uL (ref 3.87–5.11)
RDW: 12.8 % (ref 11.5–15.5)
WBC: 8.1 10*3/uL (ref 4.0–10.5)

## 2013-08-05 LAB — LIPID PANEL
Cholesterol: 183 mg/dL (ref 0–200)
HDL: 45.7 mg/dL (ref >39.00)
LDL Cholesterol: 107 mg/dL — ABNORMAL HIGH (ref 0–99)
NonHDL: 137.3
Total CHOL/HDL Ratio: 4
Triglycerides: 151 mg/dL — ABNORMAL HIGH (ref 0.0–149.0)
VLDL: 30.2 mg/dL (ref 0.0–40.0)

## 2013-08-05 LAB — HEPATIC FUNCTION PANEL
ALT: 25 U/L (ref 0–35)
AST: 32 U/L (ref 0–37)
Albumin: 3.7 g/dL (ref 3.5–5.2)
Alkaline Phosphatase: 60 U/L (ref 39–117)
Bilirubin, Direct: 0.2 mg/dL (ref 0.0–0.3)
TOTAL PROTEIN: 7 g/dL (ref 6.0–8.3)
Total Bilirubin: 0.8 mg/dL (ref 0.2–1.2)

## 2013-08-05 LAB — BASIC METABOLIC PANEL
BUN: 20 mg/dL (ref 6–23)
CALCIUM: 9.2 mg/dL (ref 8.4–10.5)
CHLORIDE: 104 meq/L (ref 96–112)
CO2: 27 meq/L (ref 19–32)
CREATININE: 1.5 mg/dL — AB (ref 0.4–1.2)
GFR: 36.23 mL/min — ABNORMAL LOW (ref >60.00)
Glucose, Bld: 128 mg/dL — ABNORMAL HIGH (ref 70–99)
Potassium: 3.7 mEq/L (ref 3.5–5.1)
Sodium: 138 mEq/L (ref 135–145)

## 2013-08-05 LAB — TSH: TSH: 3.87 u[IU]/mL (ref 0.35–4.50)

## 2013-08-05 LAB — HEMOGLOBIN A1C: HEMOGLOBIN A1C: 5.8 % (ref 4.6–6.5)

## 2013-08-05 MED ORDER — TEMAZEPAM 30 MG PO CAPS: ORAL_CAPSULE | ORAL | Status: DC

## 2013-08-05 NOTE — Assessment & Plan Note (Signed)
stable overall by history and exam, recent data reviewed with pt, and pt to continue medical treatment as before,  to f/u any worsening symptoms or concerns Lab Results  Component Value Date   LDLCALC 107* 08/05/2013

## 2013-08-05 NOTE — Patient Instructions (Addendum)
OK to decrease the losartan to 25 mg per day  Please continue all other medications as before, and refills have been done if requested.  Please have the pharmacy call with any other refills you may need.  Please continue your efforts at being more active, low cholesterol diet, and weight control.  You are otherwise up to date with prevention measures today.  Please keep your appointments with your specialists as you may have planned  Your ears were irrigated of wax today  Please call [facility] on [address] for your yearly mammogram  Please go to the LAB in the Basement (turn left off the elevator) for the tests to be done today  You will be contacted by phone if any changes need to be made immediately.  Otherwise, you will receive a letter about your results with an explanation, but please check with MyChart first.  Please return in 6 months, or sooner if needed

## 2013-08-05 NOTE — Assessment & Plan Note (Signed)
Asympt, stable overall by history and exam, recent data reviewed with pt, and pt to continue medical treatment as before,  to f/u any worsening symptoms or concerns Lab Results  Component Value Date   HGBA1C 5.8 08/05/2013

## 2013-08-05 NOTE — Assessment & Plan Note (Signed)
Improved with wax impaction irrigation

## 2013-08-05 NOTE — Assessment & Plan Note (Signed)
For fu mammogram at [place]

## 2013-08-05 NOTE — Assessment & Plan Note (Signed)
BP borderline low, ok to decr the losartan to 25 mg from 50,  to f/u any worsening symptoms or concerns

## 2013-08-05 NOTE — Progress Notes (Signed)
Subjective:    Patient ID: Rose Sullivan, female    DOB: [birth year], 80 y.o.   MRN: [id number]  HPI  Here to f/u; overall doing ok,  Pt denies chest pain, increased sob or doe, wheezing, orthopnea, PND, increased LE swelling, palpitations, dizziness or syncope.  Pt denies polydipsia, polyuria, or low sugar symptoms such as weakness or confusion improved with po intake.  Pt denies new neurological symptoms such as new headache, or facial or extremity weakness or numbness.   Pt states overall good compliance with meds, has been trying to follow lower cholesterol diet, with wt overall stable,  but little exercise due to impairment.  Still sees pain management.  Still with ongoing sleep difficulty, does well with temazepam. Needs refill.  Due for mammogram f/u, has hx of breast ca.  Has lost wt recently. Also with bilat hearing decr in the past wk - ? wax Past Medical History  Diagnosis Date  . GLUCOSE INTOLERANCE 02/11/2007  . HYPERLIPIDEMIA 02/11/2007  . BLINDNESS, [place], [place] DEFINITION 11/25/2006  . Other specified forms of hearing loss 09/05/2009  . HYPERTENSION 02/11/2007  . Atrial fibrillation 03/07/2009  . GERD 02/11/2007  . LOW BACK PAIN, CHRONIC 11/25/2006  . BACK PAIN 12/10/2007  . Pain in Soft Tissues of Limb 02/11/2007  . OSTEOPENIA 02/11/2007  . FATIGUE 12/10/2007  . GAIT DISTURBANCE 12/10/2007  . DYSPNEA 03/18/2009  . Cough 03/07/2009  . CHEST PAIN 12/10/2007  . Abdominal pain, left lower quadrant 04/01/2007  . BREAST CANCER, HX OF 11/25/2006  . Nocturia 05/18/2010  . Hypersomnia 05/18/2010  . Impaired glucose tolerance 05/18/2010  . Allergic rhinitis, cause unspecified 07/23/2012  . Anxiety state, unspecified 02/04/2013   Past Surgical History  Procedure Laterality Date  . Dilation and curettage of uterus    . S/p mastectomy    . Appendectomy      reports that she has quit smoking. She has never used smokeless tobacco. She reports that she does not drink alcohol or use illicit  drugs. family history includes COPD in her brother; Colon cancer in her father; Hypertension in her brother; Lung cancer in her brother; Stroke in her mother. No Known Allergies Current Outpatient Prescriptions on File Prior to Visit  Medication Sig Dispense Refill  . apixaban (ELIQUIS) 5 MG TABS tablet Take 5 mg by mouth 2 (two) times daily.       . diclofenac sodium (VOLTAREN) 1 % GEL Apply 1 application topically 4 (four) times daily. Apply to both knees and hands  3 Tube  4  . diltiazem (CARDIZEM CD) 240 MG 24 hr capsule Take 240 mg by mouth daily.       Marland Kitchen diltiazem (CARDIZEM CD) 240 MG 24 hr capsule TAKE ONE CAPSULE BY MOUTH EVERY DAY  30 capsule  6  . ELIQUIS 5 MG TABS tablet TAKE 1 TABLET BY MOUTH TWICE A DAY  60 tablet  6  . exemestane (AROMASIN) 25 MG tablet Take 1 tablet (25 mg total) by mouth daily.  90 tablet  3  . furosemide (LASIX) 20 MG tablet Take 20 mg by mouth daily.      Marland Kitchen gabapentin (NEURONTIN) 100 MG capsule Take 100 mg by mouth 3 (three) times daily.      Marland Kitchen HYDROcodone-acetaminophen (NORCO) 10-325 MG per tablet Take 1 tablet by mouth every 6 (six) hours as needed. To fill Mar 10, 2013  65 tablet  0  . losartan (COZAAR) 50 MG tablet Take 50 mg by mouth daily.      Marland Kitchen  omeprazole (PRILOSEC) 20 MG capsule Take 20 mg by mouth daily.      Marland Kitchen omeprazole (PRILOSEC) 20 MG capsule TAKE ONE CAPSULE BY MOUTH EVERY DAY  30 capsule  6  . potassium chloride (K-DUR,KLOR-CON) 10 MEQ tablet Take 10 mEq by mouth daily.       No current facility-administered medications on file prior to visit.    Review of Systems  Constitutional: Negative for unusual diaphoresis or other sweats  HENT: Negative for ringing in ear Eyes: Negative for double vision or worsening visual disturbance.  Respiratory: Negative for choking and stridor.   Gastrointestinal: Negative for vomiting or other signifcant bowel change Genitourinary: Negative for hematuria or decreased urine volume.  Musculoskeletal:  Negative for other MSK pain or swelling Skin: Negative for color change and worsening wound.  Neurological: Negative for tremors and numbness other than noted  Psychiatric/Behavioral: Negative for decreased concentration or agitation other than above       Objective:   Physical Exam BP 110/72  Pulse 91  Temp(Src) 97.2 F (36.2 C) (Oral)  Wt 200 lb (90.719 kg)  SpO2 95% VS noted, not ill appearing Constitutional: Pt appears well-developed, well-nourished.  HENT: Head: NCAT.  Right Ear: External ear normal.  Left Ear: External ear normal.  Bilat tm;s clear after bilat wax impactions resolved with irrigation Eyes: . Pupils are equal, round, and reactive to light. Conjunctivae and EOM are normal Neck: Normal range of motion. Neck supple.  Cardiovascular: Normal rate and regular rhythm.   Pulmonary/Chest: Effort normal and breath sounds normal.  Abd:  Soft, NT, ND, + BS Neurological: Pt is alert. Not confused , motor grossly intact Skin: Skin is warm. No rash 1+ ankle edema Psychiatric: Pt behavior is normal. No agitation.     Assessment & Plan:

## 2013-08-05 NOTE — Progress Notes (Signed)
Pre visit review using our clinic review tool, if applicable. No additional management support is needed unless otherwise documented below in the visit note. 

## 2013-08-06 ENCOUNTER — Encounter: Payer: Self-pay | Admitting: Internal Medicine

## 2013-08-11 ENCOUNTER — Encounter: Payer: Self-pay | Admitting: Physical Medicine & Rehabilitation

## 2013-08-11 ENCOUNTER — Encounter: Payer: Medicare Other | Attending: Physical Medicine & Rehabilitation | Admitting: Physical Medicine & Rehabilitation

## 2013-08-11 VITALS — BP 83/57 | HR 62 | Resp 14 | Wt 200.0 lb

## 2013-08-11 DIAGNOSIS — M1710 Unilateral primary osteoarthritis, unspecified knee: Secondary | ICD-10-CM | POA: Diagnosis not present

## 2013-08-11 DIAGNOSIS — I10 Essential (primary) hypertension: Secondary | ICD-10-CM | POA: Insufficient documentation

## 2013-08-11 DIAGNOSIS — M1711 Unilateral primary osteoarthritis, right knee: Secondary | ICD-10-CM

## 2013-08-11 DIAGNOSIS — M47816 Spondylosis without myelopathy or radiculopathy, lumbar region: Secondary | ICD-10-CM

## 2013-08-11 DIAGNOSIS — M1712 Unilateral primary osteoarthritis, left knee: Secondary | ICD-10-CM

## 2013-08-11 DIAGNOSIS — M1610 Unilateral primary osteoarthritis, unspecified hip: Secondary | ICD-10-CM

## 2013-08-11 DIAGNOSIS — M47817 Spondylosis without myelopathy or radiculopathy, lumbosacral region: Secondary | ICD-10-CM

## 2013-08-11 DIAGNOSIS — M1611 Unilateral primary osteoarthritis, right hip: Secondary | ICD-10-CM

## 2013-08-11 MED ORDER — BLOOD PRESSURE MONITOR KIT: 1.0000 [IU] | PACK | Freq: Every morning | Status: DC

## 2013-08-11 MED ORDER — HYDROCODONE-ACETAMINOPHEN 10-325 MG PO TABS: 1.0000 | ORAL_TABLET | Freq: Four times a day (QID) | ORAL | Status: DC | PRN

## 2013-08-11 NOTE — Patient Instructions (Addendum)
PLEASE CALL ME WITH ANY PROBLEMS OR QUESTIONS (#[phone]).     HOLD CARDIZEM FOR NOW AND FOLLOW UP WITH DR. Jenny Reichmann. CHECK YOUR BP'S AT HOME!!!

## 2013-08-11 NOTE — Progress Notes (Signed)
Subjective:    Patient ID: Rose Sullivan, female    DOB: 01-Feb-1933, 80 y.o.   MRN: [id number]  HPI  Rose Sullivan is back regarding her diffuse pain. She has had more pain in her right knee over the last few weeks. She has had to take more of her hydrocodone as a result and ended up running a little short before this visit.   She is not feeling well today, and when my RN took her BP is was 83/57. Daughter states that she had not taken her cardizem for a few days before coming in today. This morning, however, she took the cardizem. At her last PCP visit, her losartan was decreased apparently.    Pain Inventory Average Pain 7 Pain Right Now 8 My pain is sharp, burning, dull, stabbing and aching  In the last 24 hours, has pain interfered with the following? General activity 9 Relation with others 9 Enjoyment of life 9 What TIME of day is your pain at its worst? varies Sleep (in general) Fair  Pain is worse with: walking and standing Pain improves with: medication Relief from Meds: 7  Mobility walk with assistance ability to climb steps?  yes do you drive?  no use a wheelchair  Function I need assistance with the following:  meal prep, household duties and shopping  Neuro/Psych weakness  Prior Studies Any changes since last visit?  no  Physicians involved in your care Any changes since last visit?  no Primary care [name]   Family History  Problem Relation Age of Onset  . Hypertension Brother   . Lung cancer Brother   . Colon cancer Father   . COPD Brother   . Stroke Mother    History   Social History  . Marital Status: Widowed    Spouse Name: N/A    Number of Children: 1  . Years of Education: N/A   Occupational History  .     Social History Main Topics  . Smoking status: Former Research scientist (life sciences)  . Smokeless tobacco: Never Used     Comment: quit 40 years ago  . Alcohol Use: No  . Drug Use: No  . Sexual Activity: None   Other Topics Concern  .  None   Social History Narrative   Lives with daughter   Past Surgical History  Procedure Laterality Date  . Dilation and curettage of uterus    . S/p mastectomy    . Appendectomy     Past Medical History  Diagnosis Date  . GLUCOSE INTOLERANCE 02/11/2007  . HYPERLIPIDEMIA 02/11/2007  . BLINDNESS, [place], [place] DEFINITION 11/25/2006  . Other specified forms of hearing loss 09/05/2009  . HYPERTENSION 02/11/2007  . Atrial fibrillation 03/07/2009  . GERD 02/11/2007  . LOW BACK PAIN, CHRONIC 11/25/2006  . BACK PAIN 12/10/2007  . Pain in Soft Tissues of Limb 02/11/2007  . OSTEOPENIA 02/11/2007  . FATIGUE 12/10/2007  . GAIT DISTURBANCE 12/10/2007  . DYSPNEA 03/18/2009  . Cough 03/07/2009  . CHEST PAIN 12/10/2007  . Abdominal pain, left lower quadrant 04/01/2007  . BREAST CANCER, HX OF 11/25/2006  . Nocturia 05/18/2010  . Hypersomnia 05/18/2010  . Impaired glucose tolerance 05/18/2010  . Allergic rhinitis, cause unspecified 07/23/2012  . Anxiety state, unspecified 02/04/2013   BP 83/57  Pulse 62  Resp 14  Wt 200 lb (90.719 kg)  SpO2 96%  Opioid Risk Score:   Fall Risk Score: Moderate Fall Risk (6-13 points) (previously educated and given handout on  03/09/13) Review of Systems  Neurological: Positive for weakness.  All other systems reviewed and are negative.      Objective:   Physical Exam  General: Alert and oriented x 3, No apparent distress. Morbidly obese.  HEENT: Head is normocephalic, atraumatic, PERRLA, EOMI, sclera anicteric, oral mucosa pink and moist, dentition intact, ext ear canals clear,  Neck: Supple without JVD or lymphadenopathy  Heart: Reg rate and rhythm. No murmurs rubs or gallops  Chest: CTA bilaterally without wheezes, rales, or rhonchi; no distress  Abdomen: Soft, non-tender, non-distended, bowel sounds positive.  Extremities: No clubbing, cyanosis, trace lower extremity edema. Pulses are 2+  Skin: Clean and intact without signs of breakdown.  Neuro: Pt able to track  simple objects, primarily using her right eye. Gaze dysconjugate. Sensation was decreased to PP and LT in both hands to the wrists and both feet up to the knees in a stocking glove distribution. dtr's are 1+. Motor function is grossly 5/5.  Musculoskeletal: crepitus in both knees. Pain with meniscal maneuvers. She remained in her chair during today's exam.  Psych: Pt's affect is calm and pleasant. She was in good spirits.   Assessment & Plan:   1. Osteoarthritis of bilateral knees, right greater than left.  2. Osteoarthritis right hip  3. Facet arthropathy of lumbar and cervical spine  4. Peripheral polyneuropathy, quite possibly secondary to past chemotherapy  5. Morbid obesity  6. Legally blind  7. HTN  Plan:  1. Rxed hydrocodone 10/325 one bid prn #75 with second rx for next month (which is an increase by 10 to allow for 2-3 per day.  2. Continue gabapentin 100mg  BID to TID.  3. Voltaren gel to hands, knees. i encouraged her to apply the gel by herself to her knees in order to get the benefits on her hands at the same time  4. BP was quite low today. She had taken her cardizem this morning which she had stopped for a few days. Provided pt water during visit, and by the time she left our office her BP had improved to 119/73 and she was feeling better.  Advised her to hold the cardizem for now and follow up with Dr. Jenny Reichmann regarding its long term use. Also wrote her an rx for a blood pressure monitor.  5. Follow up with my NP in about 2 months. 30 minutes of face to face patient care time were spent during this visit. All questions were encouraged and answered.

## 2013-09-09 ENCOUNTER — Other Ambulatory Visit (HOSPITAL_BASED_OUTPATIENT_CLINIC_OR_DEPARTMENT_OTHER): Payer: Medicare Other

## 2013-09-09 ENCOUNTER — Ambulatory Visit (HOSPITAL_BASED_OUTPATIENT_CLINIC_OR_DEPARTMENT_OTHER): Payer: Medicare Other | Admitting: Nurse Practitioner

## 2013-09-09 VITALS — BP 142/75 | HR 85 | Temp 97.4°F | Resp 20 | Ht 60.0 in | Wt 198.8 lb

## 2013-09-09 DIAGNOSIS — C50919 Malignant neoplasm of unspecified site of unspecified female breast: Secondary | ICD-10-CM

## 2013-09-09 DIAGNOSIS — C7800 Secondary malignant neoplasm of unspecified lung: Secondary | ICD-10-CM

## 2013-09-09 DIAGNOSIS — M949 Disorder of cartilage, unspecified: Principal | ICD-10-CM

## 2013-09-09 DIAGNOSIS — H548 Legal blindness, as defined in USA: Secondary | ICD-10-CM

## 2013-09-09 DIAGNOSIS — C779 Secondary and unspecified malignant neoplasm of lymph node, unspecified: Secondary | ICD-10-CM

## 2013-09-09 DIAGNOSIS — M899 Disorder of bone, unspecified: Secondary | ICD-10-CM

## 2013-09-09 DIAGNOSIS — Z170 Estrogen receptor positive status [ER+]: Secondary | ICD-10-CM

## 2013-09-09 LAB — COMPREHENSIVE METABOLIC PANEL (CC13)
ALBUMIN: 3.3 g/dL — AB (ref 3.5–5.0)
ALK PHOS: 61 U/L (ref 40–150)
ALT: 19 U/L (ref 0–55)
ANION GAP: 12 meq/L — AB (ref 3–11)
AST: 25 U/L (ref 5–34)
BUN: 23.6 mg/dL (ref 7.0–26.0)
CO2: 23 meq/L (ref 22–29)
Calcium: 8.9 mg/dL (ref 8.4–10.4)
Chloride: 106 mEq/L (ref 98–109)
Creatinine: 1.2 mg/dL — ABNORMAL HIGH (ref 0.6–1.1)
Glucose: 140 mg/dl (ref 70–140)
POTASSIUM: 3.6 meq/L (ref 3.5–5.1)
SODIUM: 141 meq/L (ref 136–145)
TOTAL PROTEIN: 6.7 g/dL (ref 6.4–8.3)
Total Bilirubin: 0.94 mg/dL (ref 0.20–1.20)

## 2013-09-09 LAB — CBC WITH DIFFERENTIAL/PLATELET
BASO%: 0.2 % (ref 0.0–2.0)
Basophils Absolute: 0 10*3/uL (ref 0.0–0.1)
EOS%: 6.4 % (ref 0.0–7.0)
Eosinophils Absolute: 0.4 10*3/uL (ref 0.0–0.5)
HCT: 41.6 % (ref 34.8–46.6)
HGB: 14 g/dL (ref 11.6–15.9)
LYMPH%: 40.8 % (ref 14.0–49.7)
MCH: 30.7 pg (ref 25.1–34.0)
MCHC: 33.7 g/dL (ref 31.5–36.0)
MCV: 91.2 fL (ref 79.5–101.0)
MONO#: 0.3 10*3/uL (ref 0.1–0.9)
MONO%: 4.2 % (ref 0.0–14.0)
NEUT%: 48.4 % (ref 38.4–76.8)
NEUTROS ABS: 3 10*3/uL (ref 1.5–6.5)
Platelets: 236 10*3/uL (ref 145–400)
RBC: 4.56 10*6/uL (ref 3.70–5.45)
RDW: 12.2 % (ref 11.2–14.5)
WBC: 6.2 10*3/uL (ref 3.9–10.3)
lymph#: 2.5 10*3/uL (ref 0.9–3.3)
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
## 2013-09-12 ENCOUNTER — Telehealth: Payer: Self-pay | Admitting: Oncology

## 2013-09-12 NOTE — Telephone Encounter (Signed)
lmonvm for pt dtr angie re appts for feb 2016. message to [facility] scheduler to f/u on scheduling mammo and contacting pt.

## 2013-09-14 ENCOUNTER — Telehealth: Payer: Self-pay | Admitting: Oncology

## 2013-09-14 ENCOUNTER — Other Ambulatory Visit: Payer: Self-pay | Admitting: Nurse Practitioner

## 2013-09-14 DIAGNOSIS — Z1231 Encounter for screening mammogram for malignant neoplasm of breast: Secondary | ICD-10-CM

## 2013-09-14 DIAGNOSIS — Z9011 Acquired absence of right breast and nipple: Secondary | ICD-10-CM

## 2013-09-14 NOTE — Telephone Encounter (Signed)
Cld & left pt message of appts for mamma & BD-also gave pt appt ates for 2/16-mailing copy of sch to pt-per Melissa to contact with appts

## 2013-09-22 ENCOUNTER — Ambulatory Visit
Admission: RE | Admit: 2013-09-22 | Discharge: 2013-09-22 | Disposition: A | Discharge status: Home / Self Care | Payer: Medicare Other | Source: Ambulatory Visit | Attending: Nurse Practitioner | Admitting: Nurse Practitioner

## 2013-09-22 DIAGNOSIS — C50919 Malignant neoplasm of unspecified site of unspecified female breast: Secondary | ICD-10-CM

## 2013-09-22 DIAGNOSIS — C7800 Secondary malignant neoplasm of unspecified lung: Secondary | ICD-10-CM

## 2013-09-22 DIAGNOSIS — Z9011 Acquired absence of right breast and nipple: Secondary | ICD-10-CM

## 2013-09-22 DIAGNOSIS — M949 Disorder of cartilage, unspecified: Principal | ICD-10-CM

## 2013-09-22 DIAGNOSIS — M899 Disorder of bone, unspecified: Secondary | ICD-10-CM

## 2013-09-22 DIAGNOSIS — Z1231 Encounter for screening mammogram for malignant neoplasm of breast: Secondary | ICD-10-CM

## 2013-10-12 ENCOUNTER — Encounter: Payer: Medicare Other | Attending: Physical Medicine & Rehabilitation | Admitting: Registered Nurse

## 2013-10-12 ENCOUNTER — Encounter: Payer: Self-pay | Admitting: Registered Nurse

## 2013-10-12 VITALS — BP 131/57 | HR 78 | Resp 14 | Ht 60.0 in | Wt 205.0 lb

## 2013-10-12 DIAGNOSIS — M47816 Spondylosis without myelopathy or radiculopathy, lumbar region: Secondary | ICD-10-CM

## 2013-10-12 DIAGNOSIS — I10 Essential (primary) hypertension: Secondary | ICD-10-CM | POA: Diagnosis not present

## 2013-10-12 DIAGNOSIS — M1710 Unilateral primary osteoarthritis, unspecified knee: Secondary | ICD-10-CM

## 2013-10-12 DIAGNOSIS — M1611 Unilateral primary osteoarthritis, right hip: Secondary | ICD-10-CM

## 2013-10-12 DIAGNOSIS — Z79899 Other long term (current) drug therapy: Secondary | ICD-10-CM

## 2013-10-12 DIAGNOSIS — M1711 Unilateral primary osteoarthritis, right knee: Secondary | ICD-10-CM

## 2013-10-12 DIAGNOSIS — M1610 Unilateral primary osteoarthritis, unspecified hip: Secondary | ICD-10-CM | POA: Diagnosis not present

## 2013-10-12 DIAGNOSIS — M47817 Spondylosis without myelopathy or radiculopathy, lumbosacral region: Secondary | ICD-10-CM

## 2013-10-12 DIAGNOSIS — M169 Osteoarthritis of hip, unspecified: Secondary | ICD-10-CM

## 2013-10-12 DIAGNOSIS — M1712 Unilateral primary osteoarthritis, left knee: Secondary | ICD-10-CM

## 2013-10-12 DIAGNOSIS — Z5181 Encounter for therapeutic drug level monitoring: Secondary | ICD-10-CM

## 2013-10-12 MED ORDER — HYDROCODONE-ACETAMINOPHEN 10-325 MG PO TABS: 1.0000 | ORAL_TABLET | Freq: Four times a day (QID) | ORAL | Status: DC | PRN

## 2013-10-12 NOTE — Progress Notes (Signed)
Subjective:    Patient ID: Rose Sullivan, female    DOB: Apr 19, 1933, 80 y.o.   MRN: [id number]  HPI: Rose Sullivan is a 80 year old female who returns for follow up for chronic pain and medication refill. She says her pain is located in her right leg, right hip, right knee and mid back. She rates her pain 6. She is not following an exercise regime. She has been encouraged to start chair exercises and this has been demonstrated. She verbalizes understanding.  Daughter states she walks from living room to bathroom several times a day.Daughter in the room all questions answered. Daughter stated she was giving her mother the hydrocodone three times a day, she misunderstood what Dr. Naaman Plummer had said. Explain she doesn't have a enough tablets to take the hydrocodone three times a day. She verbalizes understanding. [name] with facial grimaces with movement tablets increased. They have been instructed to call the office in two weeks if no relief of pain with current analgesics. She has been instructed to take the medication three times a day for a week then every other day alternate between two and three tablets. They verbalized understanding.  Pain Inventory Average Pain 8 Pain Right Now 6 My pain is sharp, burning, dull, stabbing and aching  In the last 24 hours, has pain interfered with the following? General activity 9 Relation with others 9 Enjoyment of life 9 What TIME of day is your pain at its worst? morning,evening Sleep (in general) Fair  Pain is worse with: walking, bending and standing Pain improves with: medication Relief from Meds: 7  Mobility walk with assistance how many minutes can you walk? 2 ability to climb steps?  yes do you drive?  no use a wheelchair needs help with transfers  Function disabled: date disabled na retired  Neuro/Psych No problems in this area  Prior Studies Any changes since last visit?  no  Physicians involved in your  care Any changes since last visit?  no   Family History  Problem Relation Age of Onset  . Hypertension Brother   . Lung cancer Brother   . Colon cancer Father   . COPD Brother   . Stroke Mother    History   Social History  . Marital Status: Widowed    Spouse Name: N/A    Number of Children: 1  . Years of Education: N/A   Occupational History  .     Social History Main Topics  . Smoking status: Former Research scientist (life sciences)  . Smokeless tobacco: Never Used     Comment: quit 40 years ago  . Alcohol Use: No  . Drug Use: No  . Sexual Activity: None   Other Topics Concern  . None   Social History Narrative   Lives with daughter   Past Surgical History  Procedure Laterality Date  . Dilation and curettage of uterus    . S/p mastectomy    . Appendectomy     Past Medical History  Diagnosis Date  . GLUCOSE INTOLERANCE 02/11/2007  . HYPERLIPIDEMIA 02/11/2007  . BLINDNESS, [place], [place] DEFINITION 11/25/2006  . Other specified forms of hearing loss 09/05/2009  . HYPERTENSION 02/11/2007  . Atrial fibrillation 03/07/2009  . GERD 02/11/2007  . LOW BACK PAIN, CHRONIC 11/25/2006  . BACK PAIN 12/10/2007  . Pain in Soft Tissues of Limb 02/11/2007  . OSTEOPENIA 02/11/2007  . FATIGUE 12/10/2007  . GAIT DISTURBANCE 12/10/2007  . DYSPNEA 03/18/2009  . Cough 03/07/2009  .  CHEST PAIN 12/10/2007  . Abdominal pain, left lower quadrant 04/01/2007  . BREAST CANCER, HX OF 11/25/2006  . Nocturia 05/18/2010  . Hypersomnia 05/18/2010  . Impaired glucose tolerance 05/18/2010  . Allergic rhinitis, cause unspecified 07/23/2012  . Anxiety state, unspecified 02/04/2013   BP 131/57  Pulse 78  Resp 14  Ht 5' (1.524 m)  Wt 205 lb (92.987 kg)  BMI 40.04 kg/m2  SpO2 97%  Opioid Risk Score:   Fall Risk Score: Moderate Fall Risk (6-13 points) (pt educated,declined handout)    Review of Systems  Musculoskeletal: Positive for back pain.  All other systems reviewed and are negative.      Objective:   Physical Exam   Nursing note and vitals reviewed. Constitutional: She is oriented to person, place, and time. She appears well-developed and well-nourished.  HENT:  Head: Normocephalic and atraumatic.  Neck: Normal range of motion. Neck supple.  Cardiovascular: Normal rate and regular rhythm.   Pulmonary/Chest: Effort normal and breath sounds normal.  Musculoskeletal:  Normal Muscle Bulk and Muscle Testing Reveals: Upper Extremities: Full ROM and Muscle Strength 5/5 Thoracic Paraspinal tenderness: T- 4- T-7 Lower Extremities: Left Full ROM and Muscle Strength 5/5 Right Leg with Decreased ROM and Muscle Strength 4/5 Arrived to office in wheelchair  Neurological: She is alert and oriented to person, place, and time.  Skin: Skin is warm and dry.  Psychiatric: She has a normal mood and affect.          Assessment & Plan:  1. Osteoarthritis of bilateral knees, right greater than left.  Refilled: Hydrocodone 10/325 mg one tablet BID and sometimes TID increased to #85. Second script given. 2. Osteoarthritis right hip: Continue Voltaren gel four times a day. Continue heat and exercise  3. Facet arthropathy of lumbar and cervical spine : Continue current medication regime. Continue to monitor 4. Peripheral polyneuropathy, quite possibly secondary to past chemotherapy: Continue Gabapentin 5. Morbid obesity : Encouraged to increase activity, and start chair exercises. 6. Legally blind: Continue to monitor. Daughter with patient  20 minutes of face to face patient care time was spent during this visit. All questions were encouraged and answered.  F/U in 2 months

## 2013-10-26 ENCOUNTER — Telehealth: Payer: Self-pay | Admitting: Internal Medicine

## 2013-10-26 NOTE — Telephone Encounter (Signed)
Patient is requesting provider to call [facility] to request an exception for temazepam.  Ph to [facility] is [phone]

## 2013-10-27 MED ORDER — TRAZODONE HCL 50 MG PO TABS: 25.0000 mg | ORAL_TABLET | Freq: Every evening | ORAL | Status: DC | PRN

## 2013-10-27 NOTE — Telephone Encounter (Signed)
[place] for try change to trazodone 50 qhs - done erx

## 2013-10-27 NOTE — Telephone Encounter (Signed)
Please advise if to proceed with PA on temazepam or offer alternative.

## 2013-10-28 NOTE — Telephone Encounter (Signed)
Called [name] the patients daughter left a very detailed message of change and where sent in to.

## 2013-11-19 ENCOUNTER — Emergency Department (HOSPITAL_COMMUNITY)
Admission: EM | Admit: 2013-11-19 | Discharge: 2013-11-19 | Disposition: A | Discharge status: Home / Self Care | Payer: Medicare Other | Attending: Emergency Medicine | Admitting: Emergency Medicine

## 2013-11-19 ENCOUNTER — Emergency Department (HOSPITAL_COMMUNITY): Payer: Medicare Other

## 2013-11-19 ENCOUNTER — Encounter (HOSPITAL_COMMUNITY): Payer: Self-pay | Admitting: Emergency Medicine

## 2013-11-19 DIAGNOSIS — I10 Essential (primary) hypertension: Secondary | ICD-10-CM | POA: Diagnosis not present

## 2013-11-19 DIAGNOSIS — H548 Legal blindness, as defined in USA: Secondary | ICD-10-CM | POA: Insufficient documentation

## 2013-11-19 DIAGNOSIS — G8929 Other chronic pain: Secondary | ICD-10-CM | POA: Insufficient documentation

## 2013-11-19 DIAGNOSIS — M1611 Unilateral primary osteoarthritis, right hip: Secondary | ICD-10-CM

## 2013-11-19 DIAGNOSIS — F411 Generalized anxiety disorder: Secondary | ICD-10-CM | POA: Insufficient documentation

## 2013-11-19 DIAGNOSIS — M13851 Other specified arthritis, right hip: Secondary | ICD-10-CM | POA: Insufficient documentation

## 2013-11-19 DIAGNOSIS — I4891 Unspecified atrial fibrillation: Secondary | ICD-10-CM | POA: Insufficient documentation

## 2013-11-19 DIAGNOSIS — Z79899 Other long term (current) drug therapy: Secondary | ICD-10-CM | POA: Insufficient documentation

## 2013-11-19 DIAGNOSIS — Z87891 Personal history of nicotine dependence: Secondary | ICD-10-CM | POA: Diagnosis not present

## 2013-11-19 DIAGNOSIS — M25551 Pain in right hip: Secondary | ICD-10-CM | POA: Diagnosis present

## 2013-11-19 DIAGNOSIS — Z853 Personal history of malignant neoplasm of breast: Secondary | ICD-10-CM | POA: Insufficient documentation

## 2013-11-19 DIAGNOSIS — Z791 Long term (current) use of non-steroidal anti-inflammatories (NSAID): Secondary | ICD-10-CM | POA: Insufficient documentation

## 2013-11-19 DIAGNOSIS — K219 Gastro-esophageal reflux disease without esophagitis: Secondary | ICD-10-CM | POA: Diagnosis not present

## 2013-11-19 DIAGNOSIS — J069 Acute upper respiratory infection, unspecified: Secondary | ICD-10-CM | POA: Insufficient documentation

## 2013-11-19 LAB — URINE MICROSCOPIC-ADD ON

## 2013-11-19 LAB — URINALYSIS, ROUTINE W REFLEX MICROSCOPIC
GLUCOSE, UA: NEGATIVE mg/dL
Hgb urine dipstick: NEGATIVE
KETONES UR: NEGATIVE mg/dL
Nitrite: NEGATIVE
PROTEIN: NEGATIVE mg/dL
Specific Gravity, Urine: 1.025 (ref 1.005–1.030)
Urobilinogen, UA: 1 mg/dL (ref 0.0–1.0)
pH: 5.5 (ref 5.0–8.0)

## 2013-11-19 LAB — CBC WITH DIFFERENTIAL/PLATELET
Basophils Absolute: 0 10*3/uL (ref 0.0–0.1)
Basophils Relative: 0 % (ref 0–1)
EOS PCT: 2 % (ref 0–5)
Eosinophils Absolute: 0.2 10*3/uL (ref 0.0–0.7)
HEMATOCRIT: 43.5 % (ref 36.0–46.0)
Hemoglobin: 14.5 g/dL (ref 12.0–15.0)
LYMPHS PCT: 23 % (ref 12–46)
Lymphs Abs: 2 10*3/uL (ref 0.7–4.0)
MCH: 30.8 pg (ref 26.0–34.0)
MCHC: 33.3 g/dL (ref 30.0–36.0)
MCV: 92.4 fL (ref 78.0–100.0)
MONO ABS: 0.5 10*3/uL (ref 0.1–1.0)
Monocytes Relative: 5 % (ref 3–12)
Neutro Abs: 6.1 10*3/uL (ref 1.7–7.7)
Neutrophils Relative %: 70 % (ref 43–77)
Platelets: 266 10*3/uL (ref 150–400)
RBC: 4.71 MIL/uL (ref 3.87–5.11)
RDW: 12.4 % (ref 11.5–15.5)
WBC: 8.8 10*3/uL (ref 4.0–10.5)

## 2013-11-19 LAB — BASIC METABOLIC PANEL
Anion gap: 14 (ref 5–15)
BUN: 17 mg/dL (ref 6–23)
CALCIUM: 9.1 mg/dL (ref 8.4–10.5)
CO2: 22 meq/L (ref 19–32)
CREATININE: 1.17 mg/dL — AB (ref 0.50–1.10)
Chloride: 104 mEq/L (ref 96–112)
GFR calc Af Amer: 50 mL/min — ABNORMAL LOW (ref >90)
GFR calc non Af Amer: 43 mL/min — ABNORMAL LOW (ref >90)
Glucose, Bld: 110 mg/dL — ABNORMAL HIGH (ref 70–99)
Potassium: 4.4 mEq/L (ref 3.7–5.3)
Sodium: 140 mEq/L (ref 137–147)

## 2013-11-19 MED ORDER — HYDROCODONE-ACETAMINOPHEN 5-325 MG PO TABS: 2.0000 | ORAL_TABLET | Freq: Four times a day (QID) | ORAL | Status: DC | PRN

## 2013-11-19 MED ORDER — MORPHINE SULFATE 4 MG/ML IJ SOLN
6.0000 mg | Freq: Once | INTRAMUSCULAR | Status: AC
  Administered 2013-11-19: 6 mg via INTRAVENOUS
  Filled 2013-11-19: qty 2

## 2013-11-19 MED ORDER — HYDROCODONE-ACETAMINOPHEN 5-325 MG PO TABS
1.0000 | ORAL_TABLET | Freq: Once | ORAL | Status: AC
  Administered 2013-11-19: 1 via ORAL
  Filled 2013-11-19: qty 1

## 2013-11-19 NOTE — Discharge Instructions (Signed)
If you were given medicines take as directed.  If you are on coumadin or contraceptives realize their levels and effectiveness is altered by many different medicines.  If you have any reaction (rash, tongues swelling, other) to the medicines stop taking and see a physician.   Please follow up as directed and return to the ER or see a physician for new or worsening symptoms.  Thank you. Filed Vitals:   11/19/13 1224 11/19/13 1800 11/19/13 1932 11/19/13 2017  BP: 155/73 185/93 201/90 185/94  Pulse: 94 75 91 72  Temp:  98.3 F (36.8 C)  98.3 F (36.8 C)  TempSrc:  Oral  Oral  Resp:  18 16 16   SpO2: 97% 93% 98% 93%   Follow up with home health.

## 2013-11-19 NOTE — Progress Notes (Signed)
  CARE MANAGEMENT ED NOTE 11/19/2013  Patient:  Rose Sullivan, Rose Sullivan   Account Number:  [id number]  Date Initiated:  11/19/2013  Documentation initiated by:  Livia Snellen  Subjective/Objective Assessment:   Patient presents to Ed with right hip pain and chest congestion for three days     Subjective/Objective Assessment Detail:     Action/Plan:   Action/Plan Detail:   Anticipated DC Date:       Status Recommendation to Physician:   Result of Recommendation:    Other ED Services  Consult Working [place]  Other  CM consult   [facility] Choice  HOME HEALTH   Choice offered to / List presented to:  C-1 Patient          Status of service:  Completed, signed off  ED Comments:   ED Comments Detail:  EDCM consulted to see patient regarding home health needs. EDCM spoke to patient at bedside.  Patient confirms she lives at home with her daughter.  Patient reports she is hardly left alone by herself.  Patient does nto have home health services at this time.  Patient has a walker, cane, shower chair, wheelchairat home.  Patient also reports she has safety rails, "From one end of the hallway to the other."  Patient reportrs he has been walking to the bathroom, "But it takes a long time and it's not easy." Patient confirms her pcp is Dr. [name].  Patient rpeorts she saw her pcp a few weeks ago.  Patient's daughter has been helping the patient with ADL's and meals at home.  EDCM provided patient with a list of home health agencies in [facility].  EDCM explained with home health, the patient may receive a visiting RN, PT, OT aide and social worker if needed.  EDCM explained that the agency of her choice has 24-48 hours to contact her.  EDCM also provided patientwith list of private duty nursing agencies.  Patient thankful for resources.  discussed patient with EDP.  Awaiting disposition.  No further EDCM needs at this time.

## 2013-11-19 NOTE — ED Provider Notes (Signed)
CSN: [id number]     Arrival date & time 11/19/13  1219 History   First MD Initiated Contact with Patient 11/19/13 1503     Chief Complaint  Patient presents with  . Hip Pain     (Consider location/radiation/quality/duration/timing/severity/associated sxs/prior Treatment) HPI Comments: 80 year old female with lipids, chronic back pain, chronic gait difficulty, arthritis, high blood pressure, breast cancer, arthritis the right hip and knee presents with worsening right hip pain gradually worsening for the past few months. Similar to previous and no new injuries. Patient does not have any known cancer in the bones. Patient denies any focal weakness in that leg. Patient can ambulate but with pain and takes oral narcotics as needed. Patient has had congestion and mild cough for the past 2 days, no recent hospitalizations no current antibiotics. Patient denies any lung disease history  Patient is a 80 y.o. female presenting with hip pain. The history is provided by the patient.  Hip Pain Pertinent negatives include no chest pain, no abdominal pain, no headaches and no shortness of breath.    Past Medical History  Diagnosis Date  . GLUCOSE INTOLERANCE 02/11/2007  . HYPERLIPIDEMIA 02/11/2007  . BLINDNESS, [place], [place] DEFINITION 11/25/2006  . Other specified forms of hearing loss 09/05/2009  . HYPERTENSION 02/11/2007  . Atrial fibrillation 03/07/2009  . GERD 02/11/2007  . LOW BACK PAIN, CHRONIC 11/25/2006  . BACK PAIN 12/10/2007  . Pain in Soft Tissues of Limb 02/11/2007  . OSTEOPENIA 02/11/2007  . FATIGUE 12/10/2007  . GAIT DISTURBANCE 12/10/2007  . DYSPNEA 03/18/2009  . Cough 03/07/2009  . CHEST PAIN 12/10/2007  . Abdominal pain, left lower quadrant 04/01/2007  . BREAST CANCER, HX OF 11/25/2006  . Nocturia 05/18/2010  . Hypersomnia 05/18/2010  . Impaired glucose tolerance 05/18/2010  . Allergic rhinitis, cause unspecified 07/23/2012  . Anxiety state, unspecified 02/04/2013   Past Surgical History   Procedure Laterality Date  . Dilation and curettage of uterus    . S/p mastectomy    . Appendectomy     Family History  Problem Relation Age of Onset  . Hypertension Brother   . Lung cancer Brother   . Colon cancer Father   . COPD Brother   . Stroke Mother    History  Substance Use Topics  . Smoking status: Former Research scientist (life sciences)  . Smokeless tobacco: Never Used     Comment: quit 40 years ago  . Alcohol Use: No   OB History   Grav Para Term Preterm Abortions TAB SAB Ect Mult Living                 Review of Systems  Constitutional: Negative for fever and chills.  HENT: Positive for congestion.   Eyes: Negative for visual disturbance.  Respiratory: Positive for cough. Negative for shortness of breath.   Cardiovascular: Negative for chest pain.  Gastrointestinal: Negative for vomiting and abdominal pain.  Genitourinary: Negative for dysuria and flank pain.  Musculoskeletal: Positive for arthralgias and gait problem. Negative for back pain, neck pain and neck stiffness.  Skin: Negative for rash.  Neurological: Negative for light-headedness and headaches.      Allergies  Review of patient's allergies indicates no known allergies.  Home Medications   Prior to Admission medications   Medication Sig Start Date End Date Taking? Authorizing Provider  apixaban (ELIQUIS) 5 MG TABS tablet Take 5 mg by mouth 2 (two) times daily.    Yes Historical Provider, MD  diclofenac sodium (VOLTAREN) 1 % GEL Apply 1  application topically 4 (four) times daily. Apply to both knees and hands 03/09/13  Yes Meredith Staggers, MD  diltiazem (CARDIZEM CD) 240 MG 24 hr capsule TAKE ONE CAPSULE BY MOUTH EVERY DAY   Yes Biagio Borg, MD  exemestane (AROMASIN) 25 MG tablet Take 1 tablet (25 mg total) by mouth daily. 08/19/12  Yes Amy Milda Smart, PA-C  gabapentin (NEURONTIN) 100 MG capsule Take 100 mg by mouth 3 (three) times daily.   Yes Historical Provider, MD  HYDROcodone-acetaminophen (NORCO) 10-325 MG per  tablet Take 1 tablet by mouth every 6 (six) hours as needed. 10/12/13  Yes Danella Sensing, NP  losartan (COZAAR) 50 MG tablet Take 50 mg by mouth daily.   Yes Historical Provider, MD  omeprazole (PRILOSEC) 20 MG capsule TAKE ONE CAPSULE BY MOUTH EVERY DAY   Yes Biagio Borg, MD  traZODone (DESYREL) 50 MG tablet Take 0.5-1 tablets (25-50 mg total) by mouth at bedtime as needed for sleep. 10/27/13  Yes Biagio Borg, MD  Blood Pressure Monitor KIT 1 Units by Does not apply route every morning. 08/11/13   Meredith Staggers, MD  HYDROcodone-acetaminophen (NORCO) 5-325 MG per tablet Take 2 tablets by mouth every 6 (six) hours as needed for severe pain. 11/19/13   Mariea Clonts, MD   BP 185/94  Pulse 72  Temp(Src) 98.3 F (36.8 C) (Oral)  Resp 16  SpO2 93% Physical Exam  Nursing note and vitals reviewed. Constitutional: She is oriented to person, place, and time. She appears well-developed and well-nourished.  HENT:  Head: Normocephalic and atraumatic.  Eyes: Right eye exhibits no discharge. Left eye exhibits no discharge.  Neck: Normal range of motion. Neck supple. No tracheal deviation present.  Cardiovascular: Normal rate and regular rhythm.   Pulmonary/Chest: Effort normal and breath sounds normal.  Abdominal: Soft. She exhibits no distension. There is no tenderness. There is no guarding.  Musculoskeletal: She exhibits tenderness. She exhibits no edema.  Patient has tenderness with internal and external rotation the right hip as well as flexion. Patient has equal strength bilateral lower extremities with flexion extension of hips knees and great toes.  Sensation intact lower extremities.  Neurological: She is alert and oriented to person, place, and time.  Skin: Skin is warm. No rash noted.  Psychiatric: She has a normal mood and affect.    ED Course  Procedures (including critical care time) Labs Review Labs Reviewed  URINALYSIS, ROUTINE W REFLEX MICROSCOPIC - Abnormal; Notable for the  following:    Color, Urine AMBER (*)    APPearance CLOUDY (*)    Bilirubin Urine SMALL (*)    Leukocytes, UA SMALL (*)    All other components within normal limits  BASIC METABOLIC PANEL - Abnormal; Notable for the following:    Glucose, Bld 110 (*)    Creatinine, Ser 1.17 (*)    GFR calc non Af Amer 43 (*)    GFR calc Af Amer 50 (*)    All other components within normal limits  URINE MICROSCOPIC-ADD ON - Abnormal; Notable for the following:    Bacteria, UA FEW (*)    Casts HYALINE CASTS (*)    Crystals CA OXALATE CRYSTALS (*)    All other components within normal limits  CBC WITH DIFFERENTIAL    Imaging Review Dg Chest 2 View  11/19/2013   CLINICAL DATA:  Chest pain.  Initial encounter.  EXAM: CHEST  2 VIEW  COMPARISON:  04/18/2012 and 12/18/2012 radiographs.  FINDINGS:  The heart size and mediastinal contours are stable. There is chronic volume loss in the left hemithorax with chronic left basilar pulmonary opacity. No superimposed airspace disease, edema or significant pleural effusion is seen. Old rib fractures are present on the left. There are osteophytes throughout the thoracic spine.  IMPRESSION: Stable chest.  No acute cardiopulmonary process.   Electronically Signed   By: Camie Patience M.D.   On: 11/19/2013 19:41   Ct Hip Right Wo Contrast  11/19/2013   CLINICAL DATA:  80 year old female with chronic right hip pain. No known injury. Initial encounter. Personal history breast cancer.  EXAM: CT OF THE RIGHT HIP WITHOUT CONTRAST  TECHNIQUE: Multidetector CT imaging of the right hip was performed according to the standard protocol. Multiplanar CT image reconstructions were also generated.  COMPARISON:  Metastatic bone survey 06/19/2011. CT Abdomen and Pelvis 04/02/2007.  FINDINGS: Very severe degenerative changes about the right hip with joint space loss, bulky osteophytosis, and subchondral cysts. Proximal right femur intact. Visible right hemipelvis and sacrum intact, with  degenerative changes and overgrowth at the right SI joint.  Negative visible lower abdominal and pelvic viscera. No inguinal lymphadenopathy. No focal soft tissue inflammation. Calcified injection granulomas at the right flank.  IMPRESSION: Severe right hip osteoarthritis. No acute osseous abnormality identified.   Electronically Signed   By: Lars Pinks M.D.   On: 11/19/2013 20:01     EKG Interpretation None      MDM   Final diagnoses:  Right hip pain  Arthritis of right hip  URI, acute   Cough and congestion, clinically upper respiratory infection, plan for chest x-ray and basic blood work.  Worsening right hip pain likely worsening arthritis however with difficulty with walking due to pain plan for CT of the right hip to look for occult fracture. Social worker assisted to ensure home health assistance at home, patient has outpatient followup.  Chest x-ray reviewed no acute findings. CT results reviewed severe onset right is no occult fracture. Patient can ambulate with pain. Patient blood pressure elevated but is not known to take her medicines today, no chest pain or signs of stroke. Discussed outpatient followup for recheck of her blood pressure. Social work assisted and plan for home health, age and physical therapy at home. Results and differential diagnosis were discussed with the patient/parent/guardian. Close follow up outpatient was discussed, comfortable with the plan.   Medications  morphine 4 MG/ML injection 6 mg (6 mg Intravenous Given 11/19/13 1929)  HYDROcodone-acetaminophen (NORCO/VICODIN) 5-325 MG per tablet 1 tablet (1 tablet Oral Given 11/19/13 1949)    Filed Vitals:   11/19/13 1224 11/19/13 1800 11/19/13 1932 11/19/13 2017  BP: 155/73 185/93 201/90 185/94  Pulse: 94 75 91 72  Temp:  98.3 F (36.8 C)  98.3 F (36.8 C)  TempSrc:  Oral  Oral  Resp:  _0 SpO2: 97% 93% 98% 93%    Final diagnoses:  Right hip pain  Arthritis of right hip  URI, acute         Mariea Clonts, MD 11/19/13 2035

## 2013-11-19 NOTE — Progress Notes (Signed)
11/19/2013 A. Jaelynn Pozo RNCM 2254pm EDCM spoke to patient and family at bedside.  Patient's daughter on the phone Angie reports patient has had [facility] in the past and will chose them again for home health.  Patient does not require any further DME at home per patient.  Patient and patient's family concerned aboout patient going home by car, may need ambulance.  EDCM informed patient's family that insurance company may not pay for ambulnace ride back home.  Patient's family reports that patient has a wheelchair ramp and wheelchair at home, just concerned about getting in and out of car. EDCM spoke to EDP who placed order to ambulate patient.  EDCM spoke to [place] who reports patient's daughter [name] showed up at bedside and said, "I will just take her home."  Patient was dressed, patient stood up at bedside and able to sit in transport chair per EDRN.  [facility] faxed home health orders to [place] at 2247pm with conformation of receipt at 2252pm.  No further EDCM needs at this time.

## 2013-11-19 NOTE — ED Notes (Signed)
Pt from home with c/o of right hip pain and chest congestion x3 days. Denies any injury. States pain is chronic.

## 2013-11-19 NOTE — ED Notes (Signed)
Bed: [facility] Expected date:  Expected time:  Means of arrival:  Comments: 

## 2013-11-23 ENCOUNTER — Telehealth: Payer: Self-pay | Admitting: *Deleted

## 2013-11-23 NOTE — Telephone Encounter (Signed)
She has endstage OA of her right hip---we could set her up for a hip injection to see if it relieves her pain. Additionally she might want to contact her orthopedic surgeon regarding the situation to see if he would reconsider about joint replacement. Thirdly, we could try something stronger for pain. She can't have #3 without doing the other two however-----can increase her pain medication to percocet 7.5/325, 1 q8 prn #90,

## 2013-11-23 NOTE — Telephone Encounter (Signed)
Received a call from [facility] RN in home with Rose Sullivan.  Apparently the pain is so bad in her hip she went to the ED @ [facility] 11/19/13.  They gave her IV morphine and an RX for hydrocodone 5/325 #90 (which she says the family has not filled because she is taking what was given to her from this office (10/325) but it is not touching the pain.  She is getting up and down during the night with the pain.  What can she do? (I told them not to fill the rx from the ED as she is getting meds from this office)

## 2013-11-24 ENCOUNTER — Telehealth: Payer: Self-pay | Admitting: Internal Medicine

## 2013-11-24 NOTE — Telephone Encounter (Signed)
Angie/daughter Phone [phone] Called to request a steroid be ordered for severe right hip pain and inflammation.  Pt seen at [facility] ED 11/19/13 for severe hip pain.  Diagnosed with right hip pain, arthritis of right hip and acute URI. Treated with Morphine and a dose of Hydrocodone.  Received Rx for Hydrocone 5 mg; #10 sig 2 po every 6 hours but pharmacy would not refill since same medication is ordered by Pain Management (10 mg TID).  ED instructions said to call PCP within 2 days for follow up.  Angie said since Mom cannot get into car to come to office,  they would have to get ambulance transport.    [facility] nurse suggested the steroid would be beneficial and contacted pain management about pain medication but did not call PCP regarding steroid.  Please advise if pt must be seen in office for a steroid to be ordered.  If necessary, daughter will ask [place] nurse to contact Dr. Jenny Reichmann.  Declined triage.  Symptoms are not worse.

## 2013-11-24 NOTE — Telephone Encounter (Signed)
Tammy to please see if can be worked in for Dr Tamala Julian schedule wed or thur this week  Let me know if not working out

## 2013-11-25 MED ORDER — PREDNISONE 10 MG PO TABS: ORAL_TABLET | ORAL | Status: DC

## 2013-11-25 NOTE — Telephone Encounter (Signed)
Left vm for Angie to call me back to work in with Dr. Tamala Julian.

## 2013-11-25 NOTE — Telephone Encounter (Signed)
Ok this time only - for predpac asd - done erx

## 2013-11-25 NOTE — Telephone Encounter (Signed)
Nira Conn, nurse from [facility], called stated she spoke with [name]'s daughter and the daughter stated she can not get Rose Sullivan to come in for an appt. Please advise.

## 2013-11-25 NOTE — Telephone Encounter (Signed)
Daughter informed prescription sent in.

## 2013-11-26 NOTE — Telephone Encounter (Signed)
Heather from [facility] called back hoping to speak to [facility] about Edwinna's pain meds

## 2013-11-26 NOTE — Telephone Encounter (Signed)
Left message on Rose Sullivan's voicemail to return my call

## 2013-11-27 ENCOUNTER — Telehealth: Payer: Self-pay | Admitting: *Deleted

## 2013-11-27 MED ORDER — OXYCODONE-ACETAMINOPHEN 7.5-325 MG PO TABS: 1.0000 | ORAL_TABLET | Freq: Three times a day (TID) | ORAL | Status: DC | PRN

## 2013-11-27 NOTE — Telephone Encounter (Signed)
Per Dr Naaman Plummer direction I have printed the rx for percocet 7.5-325   1 q 8hr prn #90.  Daughter [name] will pick up before 3 pm today.  She will not give her mother the hydrocodone after this rx is filled and will bring back the remainder hydrocodone to the appt with Danella Sensing NP on 12/09/13 for our office to destroy the remainder of the rx.

## 2013-11-27 NOTE — Telephone Encounter (Signed)
I spoke with Nira Conn about Rose Sullivan.  She has gone to the Ed and had xrays and was told she was not a candidate for hip replacement.  Nira Conn will communicated the message to family about a possible injection at some point (Dr Judi Cong ordered prednisone taper) and if they need stronger pain medication they can call the office and we will arrange for the percocet rx to be printed and signed per Dr Naaman Plummer comments.

## 2013-12-03 DIAGNOSIS — Z0279 Encounter for issue of other medical certificate: Secondary | ICD-10-CM

## 2013-12-09 ENCOUNTER — Encounter: Payer: Self-pay | Admitting: Registered Nurse

## 2013-12-09 ENCOUNTER — Telehealth: Payer: Self-pay | Admitting: *Deleted

## 2013-12-09 ENCOUNTER — Other Ambulatory Visit: Payer: Self-pay | Admitting: Physical Medicine & Rehabilitation

## 2013-12-09 ENCOUNTER — Encounter: Payer: Medicare Other | Attending: Physical Medicine & Rehabilitation | Admitting: Registered Nurse

## 2013-12-09 VITALS — BP 145/62 | HR 77 | Resp 18

## 2013-12-09 DIAGNOSIS — M1711 Unilateral primary osteoarthritis, right knee: Secondary | ICD-10-CM | POA: Diagnosis not present

## 2013-12-09 DIAGNOSIS — G894 Chronic pain syndrome: Secondary | ICD-10-CM | POA: Diagnosis present

## 2013-12-09 DIAGNOSIS — Z79899 Other long term (current) drug therapy: Secondary | ICD-10-CM

## 2013-12-09 DIAGNOSIS — Z5181 Encounter for therapeutic drug level monitoring: Secondary | ICD-10-CM | POA: Diagnosis present

## 2013-12-09 DIAGNOSIS — M1611 Unilateral primary osteoarthritis, right hip: Secondary | ICD-10-CM

## 2013-12-09 DIAGNOSIS — M545 Low back pain: Secondary | ICD-10-CM

## 2013-12-09 MED ORDER — OXYCODONE-ACETAMINOPHEN 7.5-325 MG PO TABS: 1.0000 | ORAL_TABLET | Freq: Three times a day (TID) | ORAL | Status: DC | PRN

## 2013-12-09 NOTE — Progress Notes (Signed)
Subjective:    Patient ID: Rose Sullivan, female    DOB: April 07, 1933, 80 y.o.   MRN: [id number]  HPI: [name] is a 80 year old female who returns for follow up for chronic pain and medication refill. She says her pain is located in her right leg, right hip,right knee and lower back. She rates her pain 8. Her current exercise regime is chair exercises and she has demonstrated them correctly..  She says the pain has become more intense in her right hip and right knee. She asking what can be done will increase her tablets. Her daughter in the room all questions answered. Her daughter will continue to dispersed her pain medications, also instructed not to give if her mother is drowsy. She verbalizes understanding.  Hydrocodone destroyed per policy. Pain Inventory Average Pain 8 Pain Right Now 8 My pain is sharp, burning and aching  In the last 24 hours, has pain interfered with the following? General activity unsure Relation with others unsure Enjoyment of life unsure What TIME of day is your pain at its worst? morning, daytime, evening, night Sleep (in general) Fair  Pain is worse with: unsure Pain improves with: medication Relief from Meds: 4  Mobility walk with assistance use a walker do you drive?  no Do you have any goals in this area?  yes  Function disabled: date disabled .  Neuro/Psych bladder control problems  Prior Studies Any changes since last visit?  yes bone scan x-rays cat scan  Physicians involved in your care Any changes since last visit?  no   Family History  Problem Relation Age of Onset  . Hypertension Brother   . Lung cancer Brother   . Colon cancer Father   . COPD Brother   . Stroke Mother    History   Social History  . Marital Status: Widowed    Spouse Name: N/A    Number of Children: 1  . Years of Education: N/A   Occupational History  .     Social History Main Topics  . Smoking status: Former Research scientist (life sciences)  .  Smokeless tobacco: Never Used     Comment: quit 40 years ago  . Alcohol Use: No  . Drug Use: No  . Sexual Activity: None   Other Topics Concern  . None   Social History Narrative   Lives with daughter   Past Surgical History  Procedure Laterality Date  . Dilation and curettage of uterus    . S/p mastectomy    . Appendectomy     Past Medical History  Diagnosis Date  . GLUCOSE INTOLERANCE 02/11/2007  . HYPERLIPIDEMIA 02/11/2007  . BLINDNESS, [place], [place] DEFINITION 11/25/2006  . Other specified forms of hearing loss 09/05/2009  . HYPERTENSION 02/11/2007  . Atrial fibrillation 03/07/2009  . GERD 02/11/2007  . LOW BACK PAIN, CHRONIC 11/25/2006  . BACK PAIN 12/10/2007  . Pain in Soft Tissues of Limb 02/11/2007  . OSTEOPENIA 02/11/2007  . FATIGUE 12/10/2007  . GAIT DISTURBANCE 12/10/2007  . DYSPNEA 03/18/2009  . Cough 03/07/2009  . CHEST PAIN 12/10/2007  . Abdominal pain, left lower quadrant 04/01/2007  . BREAST CANCER, HX OF 11/25/2006  . Nocturia 05/18/2010  . Hypersomnia 05/18/2010  . Impaired glucose tolerance 05/18/2010  . Allergic rhinitis, cause unspecified 07/23/2012  . Anxiety state, unspecified 02/04/2013   BP 145/62 mmHg  Pulse 77  Resp 18  SpO2 95%  Opioid Risk Score:   Fall Risk Score: High Fall  Risk (>13 points)  Review of Systems  Genitourinary: Positive for pelvic pain.  Musculoskeletal: Positive for joint swelling and arthralgias.  All other systems reviewed and are negative.      Objective:   Physical Exam  Constitutional: She is oriented to person, place, and time. She appears well-developed and well-nourished.  HENT:  Head: Normocephalic and atraumatic.  Neck: Normal range of motion. Neck supple.  Cardiovascular: Normal rate and regular rhythm.   Pulmonary/Chest: Effort normal and breath sounds normal.  Musculoskeletal:  Normal Muscle Bulk and Muscle testing reveals: Upper extremities: Full ROM and Muscle strength 5/5 Lumbar Paraspinal tenderness: L-3-  L-4 Lower Extremities: Left Full ROM and Muscle Strength 5/5 Right with decreased ROM and muscle strength 3/5 Right leg flexion produces pain into right hip and patella Arrived in wheelchair  Neurological: She is alert and oriented to person, place, and time.  Skin: Skin is warm and dry.  Psychiatric: She has a normal mood and affect.  Nursing note and vitals reviewed.         Assessment & Plan:  1. Osteoarthritis of bilateral knees, right greater than left.  Refilled: Oxycodone-acetaminophen 7.5/325mg  one tablet every 8 hours as needed. May take an extra tablet when pain is sever. No more than 4 a Day Second script given. 2. Osteoarthritis right hip: Continue Voltaren gel four times a day. Continue heat and exercise  3. Facet arthropathy of lumbar and cervical spine : Continue current medication regime. Continue to monitor 4. Peripheral polyneuropathy, quite possibly secondary to past chemotherapy: Continue Gabapentin 5. Morbid obesity : Encouraged to increase activity, and start chair exercises. 6. Legally blind: Continue to monitor. Daughter with patient  20 minutes of face to face patient care time was spent during this visit. All questions were encouraged and answered.  F/U in 2 months

## 2013-12-09 NOTE — Telephone Encounter (Signed)
In office today - increased pain meds.  Destroyed hydrocodone 10-325  #[phone] - witness Mancel Parsons CMA

## 2013-12-10 DIAGNOSIS — M1711 Unilateral primary osteoarthritis, right knee: Secondary | ICD-10-CM

## 2013-12-10 DIAGNOSIS — M549 Dorsalgia, unspecified: Secondary | ICD-10-CM

## 2013-12-10 DIAGNOSIS — M1611 Unilateral primary osteoarthritis, right hip: Secondary | ICD-10-CM

## 2013-12-10 DIAGNOSIS — M25551 Pain in right hip: Secondary | ICD-10-CM

## 2013-12-10 LAB — PMP ALCOHOL METABOLITE (ETG): ETGU: NEGATIVE ng/mL

## 2013-12-11 ENCOUNTER — Telehealth: Payer: Self-pay

## 2013-12-11 NOTE — Telephone Encounter (Signed)
HHRN would like a verbal to extend the patients bath aid for 2 more weeks for twice weekly.  Verbal is ok call back number [phone]

## 2013-12-11 NOTE — Telephone Encounter (Signed)
Ok for verbal 

## 2013-12-11 NOTE — Telephone Encounter (Signed)
HHRN informed 

## 2013-12-12 LAB — OPIATES/OPIOIDS (LC/MS-MS)
CODEINE URINE: NEGATIVE ng/mL (ref <50)
HYDROCODONE: NEGATIVE ng/mL (ref <50)
Hydromorphone: NEGATIVE ng/mL (ref <50)
Morphine Urine: NEGATIVE ng/mL (ref <50)
Norhydrocodone, Ur: NEGATIVE ng/mL (ref <50)
Noroxycodone, Ur: 3524 ng/mL (ref <50)
Oxycodone, ur: 2390 ng/mL (ref <50)
Oxymorphone: 1012 ng/mL (ref <50)

## 2013-12-12 LAB — PRESCRIPTION MONITORING PROFILE (SOLSTAS)
AMPHETAMINE/METH: NEGATIVE ng/mL
Barbiturate Screen, Urine: NEGATIVE ng/mL
Benzodiazepine Screen, Urine: NEGATIVE ng/mL
Buprenorphine, Urine: NEGATIVE ng/mL
Cannabinoid Scrn, Ur: NEGATIVE ng/mL
Carisoprodol, Urine: NEGATIVE ng/mL
Cocaine Metabolites: NEGATIVE ng/mL
Creatinine, Urine: 213.61 mg/dL (ref >20.0)
Fentanyl, Ur: NEGATIVE ng/mL
MDMA URINE: NEGATIVE ng/mL
Meperidine, Ur: NEGATIVE ng/mL
Methadone Screen, Urine: NEGATIVE ng/mL
NITRITES URINE, INITIAL: NEGATIVE ug/mL
PH URINE, INITIAL: 6.1 pH (ref 4.5–8.9)
PROPOXYPHENE: NEGATIVE ng/mL
TAPENTADOLUR: NEGATIVE ng/mL
Tramadol Scrn, Ur: NEGATIVE ng/mL
Zolpidem, Urine: NEGATIVE ng/mL

## 2013-12-12 LAB — OXYCODONE, URINE (LC/MS-MS)
NOROXYCODONE, UR: 3524 ng/mL (ref <50)
OXYCODONE, UR: 2390 ng/mL (ref <50)
Oxymorphone: 1012 ng/mL (ref <50)

## 2013-12-21 ENCOUNTER — Telehealth: Payer: Self-pay | Admitting: Oncology

## 2013-12-21 NOTE — Telephone Encounter (Signed)
per pof to r/s appt-left pt a vm-mailed copy of updated sch

## 2013-12-22 ENCOUNTER — Other Ambulatory Visit: Payer: Self-pay | Admitting: Internal Medicine

## 2013-12-23 ENCOUNTER — Telehealth: Payer: Self-pay

## 2013-12-23 ENCOUNTER — Other Ambulatory Visit: Payer: Self-pay

## 2013-12-23 MED ORDER — GLUCOSE BLOOD VI STRP: ORAL_STRIP | Status: DC

## 2013-12-23 MED ORDER — ONETOUCH ULTRASOFT LANCETS MISC: Status: DC

## 2013-12-23 NOTE — Telephone Encounter (Signed)
Ok to try R73.09 (prediabetes) but o/w I would not be able to help with this as she doesnot have DM

## 2013-12-23 NOTE — Telephone Encounter (Signed)
Sent in prescription for strips and lancets.  The only diagnosis code I could find was R73.02, but it is invalid for medicare.  Advise on code as must be on prescription for strips and lancets for medicare.

## 2013-12-23 NOTE — Telephone Encounter (Signed)
Pharmacy informed and stated they would try that code, if that does not work they will inform the patient to buy OTC.

## 2013-12-31 ENCOUNTER — Telehealth: Payer: Self-pay | Admitting: Internal Medicine

## 2013-12-31 NOTE — Telephone Encounter (Signed)
Ok for verbal 

## 2013-12-31 NOTE — Telephone Encounter (Signed)
HHRN informed 

## 2013-12-31 NOTE — Telephone Encounter (Signed)
Rose Sullivan called in from home care and wanted to get some verbal orders for :  *2 times a week for the next 2 week for Occupational therapy?     Rose Sullivan number is [phone]

## 2014-02-03 ENCOUNTER — Encounter: Payer: Medicare Other | Attending: Physical Medicine & Rehabilitation | Admitting: Registered Nurse

## 2014-02-03 ENCOUNTER — Encounter: Payer: Self-pay | Admitting: Registered Nurse

## 2014-02-03 VITALS — BP 126/62 | HR 76 | Resp 14

## 2014-02-03 DIAGNOSIS — Z5181 Encounter for therapeutic drug level monitoring: Secondary | ICD-10-CM

## 2014-02-03 DIAGNOSIS — G894 Chronic pain syndrome: Secondary | ICD-10-CM | POA: Diagnosis present

## 2014-02-03 DIAGNOSIS — Z79899 Other long term (current) drug therapy: Secondary | ICD-10-CM | POA: Diagnosis present

## 2014-02-03 DIAGNOSIS — M1611 Unilateral primary osteoarthritis, right hip: Secondary | ICD-10-CM

## 2014-02-03 DIAGNOSIS — M1711 Unilateral primary osteoarthritis, right knee: Secondary | ICD-10-CM

## 2014-02-03 MED ORDER — OXYCODONE-ACETAMINOPHEN 7.5-325 MG PO TABS: 1.0000 | ORAL_TABLET | Freq: Three times a day (TID) | ORAL | Status: DC | PRN

## 2014-02-03 NOTE — Progress Notes (Signed)
Subjective:    Patient ID: Rose Sullivan, female    DOB: Sep 02, 1933, 81 y.o.   MRN: [id number]  HPI: [name] is a 81 year old female who returns for follow up for chronic pain and medication refill. She says her pain is located in her right hip and right leg. She rates her pain 8. Her current exercise regime is chair exercises and walking with the walker in her home. Today she was standing on the porch with her walker lost her balanced and she fell backwards and hit her head and landed on her buttocks. Her daughter said it took 4 people to get her up. They didn't seek medical attention, came to her appointment. Rose Sullivan refuses to got to ED, she is complaining of occipital tenderness. They were instructed to follow up with her PCP. Her daughter called Dr. [name] office while in the room on hold. Rose Sullivan is alert and oriented x 3.   Her daughter in the room all questions answered. Her daughter will continue to dispersed her pain medications.  Arrived to office in wheelchair.  Pain Inventory Average Pain 6 Pain Right Now 8 My pain is constant, sharp, stabbing and aching  In the last 24 hours, has pain interfered with the following? General activity 4 Relation with others 4 Enjoyment of life 4 What TIME of day is your pain at its worst? evening Sleep (in general) Fair  Pain is worse with: walking and some activites Pain improves with: rest and medication Relief from Meds: 4  Mobility walk with assistance use a cane use a walker  Function retired  Neuro/Psych numbness trouble walking  Prior Studies Any changes since last visit?  no  Physicians involved in your care Any changes since last visit?  no   Family History  Problem Relation Age of Onset  . Hypertension Brother   . Lung cancer Brother   . Colon cancer Father   . COPD Brother   . Stroke Mother    History   Social History  . Marital Status: Widowed    Spouse Name: N/A   Number of Children: 1  . Years of Education: N/A   Occupational History  .     Social History Main Topics  . Smoking status: Former Research scientist (life sciences)  . Smokeless tobacco: Never Used     Comment: quit 40 years ago  . Alcohol Use: No  . Drug Use: No  . Sexual Activity: None   Other Topics Concern  . None   Social History Narrative   Lives with daughter   Past Surgical History  Procedure Laterality Date  . Dilation and curettage of uterus    . S/p mastectomy    . Appendectomy     Past Medical History  Diagnosis Date  . GLUCOSE INTOLERANCE 02/11/2007  . HYPERLIPIDEMIA 02/11/2007  . BLINDNESS, [place], [place] DEFINITION 11/25/2006  . Other specified forms of hearing loss 09/05/2009  . HYPERTENSION 02/11/2007  . Atrial fibrillation 03/07/2009  . GERD 02/11/2007  . LOW BACK PAIN, CHRONIC 11/25/2006  . BACK PAIN 12/10/2007  . Pain in Soft Tissues of Limb 02/11/2007  . OSTEOPENIA 02/11/2007  . FATIGUE 12/10/2007  . GAIT DISTURBANCE 12/10/2007  . DYSPNEA 03/18/2009  . Cough 03/07/2009  . CHEST PAIN 12/10/2007  . Abdominal pain, left lower quadrant 04/01/2007  . BREAST CANCER, HX OF 11/25/2006  . Nocturia 05/18/2010  . Hypersomnia 05/18/2010  . Impaired glucose tolerance 05/18/2010  . Allergic  rhinitis, cause unspecified 07/23/2012  . Anxiety state, unspecified 02/04/2013   BP 126/62 mmHg  Pulse 76  Resp 14  SpO2 97%  Opioid Risk Score:   Fall Risk Score: High Fall Risk (>13 points)  Review of Systems  HENT: Negative.   Eyes: Negative.   Respiratory: Negative.   Cardiovascular: Negative.   Gastrointestinal: Negative.   Endocrine: Negative.   Genitourinary: Negative.   Musculoskeletal: Positive for myalgias, back pain and arthralgias.       Right leg and hip pain  Skin: Negative.   Allergic/Immunologic: Negative.   Neurological: Positive for weakness.       Trouble walking  Hematological: Negative.   Psychiatric/Behavioral: Positive for confusion.       Objective:   Physical Exam    Constitutional: She is oriented to person, place, and time. She appears well-developed and well-nourished.  HENT:  Head: Normocephalic and atraumatic.  Occipital Tenderness  Neck: Normal range of motion. Neck supple.  Cardiovascular: Normal rate and regular rhythm.   Pulmonary/Chest: Effort normal and breath sounds normal.  Musculoskeletal:  Normal Muscle Bulk and Muscle Testing Reveals: Upper Extremities: Decreased ROM  Right 90 Degrees and Left 100 Degrees and Muscle Strength 4/5 Right Greater Trochanteric Tenderness Lower Extremities: Full ROM and Muscle Strength 5/5 Right Leg Flexion Produces pain into Right Hip Arrived in wheelchair  Neurological: She is alert and oriented to person, place, and time.  Skin: Skin is warm and dry.  Psychiatric: She has a normal mood and affect.  Nursing note and vitals reviewed.         Assessment & Plan:  1. Osteoarthritis of bilateral knees, right greater than left.  Refilled: Oxycodone-acetaminophen 7.5/325mg  one tablet every 8 hours as needed. May take an extra tablet when pain is sever. No more than 4 a Day Second script given. 2. Osteoarthritis right hip: Continue Voltaren gel four times a day. Continue heat and exercise  3. Facet arthropathy of lumbar and cervical spine : Continue current medication regime. Continue to monitor 4. Peripheral polyneuropathy, quite possibly secondary to past chemotherapy: Continue Gabapentin 5. Morbid obesity : Encouraged to increase activity, and start chair exercises. 6. Legally blind: Continue to monitor. Daughter with patient  20 minutes of face to face patient care time was spent during this visit. All questions were encouraged and answered.  F/U in 2 months

## 2014-02-06 ENCOUNTER — Telehealth: Payer: Self-pay | Admitting: Oncology

## 2014-02-06 NOTE — Telephone Encounter (Signed)
due to GM lecture moved 3/3 appt to HF per GM. lmonvm for dtr angie and mailed schedule.

## 2014-02-09 ENCOUNTER — Encounter: Payer: Self-pay | Admitting: Internal Medicine

## 2014-02-09 ENCOUNTER — Ambulatory Visit (INDEPENDENT_AMBULATORY_CARE_PROVIDER_SITE_OTHER): Payer: Medicare Other | Admitting: Internal Medicine

## 2014-02-09 VITALS — BP 124/88 | HR 88 | Temp 98.2°F | Resp 16 | Ht 60.0 in | Wt 194.0 lb

## 2014-02-09 DIAGNOSIS — E785 Hyperlipidemia, unspecified: Secondary | ICD-10-CM

## 2014-02-09 DIAGNOSIS — R202 Paresthesia of skin: Secondary | ICD-10-CM

## 2014-02-09 DIAGNOSIS — R7302 Impaired glucose tolerance (oral): Secondary | ICD-10-CM

## 2014-02-09 DIAGNOSIS — M542 Cervicalgia: Secondary | ICD-10-CM

## 2014-02-09 DIAGNOSIS — H9193 Unspecified hearing loss, bilateral: Secondary | ICD-10-CM

## 2014-02-09 DIAGNOSIS — I10 Essential (primary) hypertension: Secondary | ICD-10-CM

## 2014-02-09 MED ORDER — TEMAZEPAM 15 MG PO CAPS: ORAL_CAPSULE | ORAL | Status: DC

## 2014-02-09 NOTE — Assessment & Plan Note (Addendum)
stable overall by history and exam, recent data reviewed with pt, and pt to continue medical treatment as before,  to f/u any worsening symptoms or concerns Lab Results  Component Value Date   HGBA1C 5.8 08/05/2013   Note:  Total time for pt hx, exam, review of record with pt in the room, determination of diagnoses and plan for further eval and tx is > 40 min, with over 50% spent in coordination and counseling of patient

## 2014-02-09 NOTE — Assessment & Plan Note (Signed)
Also for bilat ear irrigation today

## 2014-02-09 NOTE — Progress Notes (Signed)
Pre visit review using our clinic review tool, if applicable. No additional management support is needed unless otherwise documented below in the visit note. 

## 2014-02-09 NOTE — Assessment & Plan Note (Signed)
stable overall by history and exam, recent data reviewed with pt, and pt to continue medical treatment as before,  to f/u any worsening symptoms or concerns BP Readings from Last 3 Encounters:  02/09/14 124/88  02/03/14 126/62  12/09/13 145/62

## 2014-02-09 NOTE — Progress Notes (Signed)
Subjective:    Patient ID: Rose Sullivan, female    DOB: November 30, 1933, 81 y.o.   MRN: [id number]  HPI  Here to f/u,  Still with ongoing insomnia, trazodone cuased hallucinations, asks for change to temazepam as covered by insurance. C/o bilat hand numbness, ongoing for 2-3 months, worse last wk, has very soft chair at home,  Had neg NCS per family a few yrs ago, also with neck pain however ongoing, overall mild , no obvious radicular symptoms.  Did have hip pain inflammatory, now s/p PT and OT course both ended about xmas dec 25.  Taking tramadol, pain ok overall.   Pt denies chest pain, increased sob or doe, wheezing, orthopnea, PND, increased LE swelling, palpitations, dizziness or syncope.  Pt denies new neurological symptoms such as new headache, or facial or extremity weakness or numbness  . No overt bleeding. Also with bilat hearing loss - ? Wax impaction again.  Past Medical History  Diagnosis Date  . GLUCOSE INTOLERANCE 02/11/2007  . HYPERLIPIDEMIA 02/11/2007  . BLINDNESS, [place], [place] DEFINITION 11/25/2006  . Other specified forms of hearing loss 09/05/2009  . HYPERTENSION 02/11/2007  . Atrial fibrillation 03/07/2009  . GERD 02/11/2007  . LOW BACK PAIN, CHRONIC 11/25/2006  . BACK PAIN 12/10/2007  . Pain in Soft Tissues of Limb 02/11/2007  . OSTEOPENIA 02/11/2007  . FATIGUE 12/10/2007  . GAIT DISTURBANCE 12/10/2007  . DYSPNEA 03/18/2009  . Cough 03/07/2009  . CHEST PAIN 12/10/2007  . Abdominal pain, left lower quadrant 04/01/2007  . BREAST CANCER, HX OF 11/25/2006  . Nocturia 05/18/2010  . Hypersomnia 05/18/2010  . Impaired glucose tolerance 05/18/2010  . Allergic rhinitis, cause unspecified 07/23/2012  . Anxiety state, unspecified 02/04/2013   Past Surgical History  Procedure Laterality Date  . Dilation and curettage of uterus    . S/p mastectomy    . Appendectomy      reports that she has quit smoking. She has never used smokeless tobacco. She reports that she does not drink alcohol or  use illicit drugs. family history includes COPD in her brother; Colon cancer in her father; Hypertension in her brother; Lung cancer in her brother; Stroke in her mother. Allergies  Allergen Reactions  . Trazodone And Nefazodone Other (See Comments)    hallucinations   Current Outpatient Prescriptions on File Prior to Visit  Medication Sig Dispense Refill  . apixaban (ELIQUIS) 5 MG TABS tablet Take 5 mg by mouth 2 (two) times daily.     . Blood Pressure Monitor KIT 1 Units by Does not apply route every morning. 1 each 0  . diclofenac sodium (VOLTAREN) 1 % GEL Apply 1 application topically 4 (four) times daily. Apply to both knees and hands 3 Tube 4  . diltiazem (CARDIZEM CD) 240 MG 24 hr capsule TAKE ONE CAPSULE BY MOUTH EVERY DAY 30 capsule 6  . exemestane (AROMASIN) 25 MG tablet Take 1 tablet (25 mg total) by mouth daily. 90 tablet 3  . gabapentin (NEURONTIN) 100 MG capsule Take 100 mg by mouth 3 (three) times daily.    Marland Kitchen glucose blood (ONE TOUCH ULTRA TEST) test strip Use as instructed once daily to check blood sugar.  ICD 10 code R73.02 100 each 11  . Lancets (ONETOUCH ULTRASOFT) lancets Use as instructed once daily to check blood sugar.  ICD 10 code R73.02 100 each 11  . losartan (COZAAR) 50 MG tablet Take 50 mg by mouth daily.    Marland Kitchen omeprazole (PRILOSEC) 20 MG capsule  TAKE ONE CAPSULE BY MOUTH EVERY DAY 30 capsule 6  . oxyCODONE-acetaminophen (PERCOCET) 7.5-325 MG per tablet Take 1 tablet by mouth every 8 (eight) hours as needed for pain. 110 tablet 0  . predniSONE (DELTASONE) 10 MG tablet 3 tabs by mouth per day for 3 days,2tabs per day for 3 days,2tab per day for 3 days 18 tablet 0  . traZODone (DESYREL) 50 MG tablet Take 0.5-1 tablets (25-50 mg total) by mouth at bedtime as needed for sleep. (Patient not taking: Reported on 02/09/2014) 90 tablet 1   No current facility-administered medications on file prior to visit.   Review of Systems  Constitutional: Negative for unusual  diaphoresis or other sweats  HENT: Negative for ringing in ear Eyes: Negative for double vision or worsening visual disturbance.  Respiratory: Negative for choking and stridor.   Gastrointestinal: Negative for vomiting or other signifcant bowel change Genitourinary: Negative for hematuria or decreased urine volume.  Musculoskeletal: Negative for other MSK pain or swelling Skin: Negative for color change and worsening wound.  Neurological: Negative for tremors and numbness other than noted  Psychiatric/Behavioral: Negative for decreased concentration or agitation other than above       Objective:   Physical Exam BP 124/88 mmHg  Pulse 88  Temp(Src) 98.2 F (36.8 C) (Oral)  Resp 16  Ht 5' (1.524 m)  Wt 194 lb (87.998 kg)  BMI 37.89 kg/m2  SpO2 96% VS noted,  Constitutional: Pt appears well-developed, well-nourished.  HENT: Head: NCAT.  Right Ear: External ear normal.  Left Ear: External ear normal.  Eyes: . Pupils are equal, round, and reactive to light. Conjunctivae and EOM are normal Neck: Normal range of motion. Neck supple.  Cardiovascular: Normal rate and regular rhythm.   Pulmonary/Chest: Effort normal and breath sounds without rales or wheezing.  Abd:  Soft, NT, ND, + BS Spine nontender Neurological: Pt is alert. Not confused , motor grossly intact, decr senes to LT to hands Skin: Skin is warm. No rash Psychiatric: Pt behavior is normal. No agitation.     Assessment & Plan:

## 2014-02-09 NOTE — Assessment & Plan Note (Signed)
For MRI c-spine ,  to f/u any worsening symptoms or concerns

## 2014-02-09 NOTE — Patient Instructions (Signed)
Your ears were irrigated of wax tody  Please take all new medication as prescribed - the temazepam for sleep  Please continue all other medications as before, and refills have been done if requested.  Please have the pharmacy call with any other refills you may need.  Please keep your appointments with your specialists as you may have planned  You will be contacted regarding the referral for: MRI for the neck  Please go to the LAB in the Basement (turn left off the elevator) for the tests to be done today  You will be contacted by phone if any changes need to be made immediately.  Otherwise, you will receive a letter about your results with an explanation, but please check with MyChart first.  Please remember to sign up for MyChart if you have not done so, as this will be important to you in the future with finding out test results, communicating by private email, and scheduling acute appointments online when needed.  Please return in 6 months, or sooner if needed

## 2014-02-09 NOTE — Assessment & Plan Note (Signed)
For MRI c-spine as above

## 2014-02-09 NOTE — Assessment & Plan Note (Signed)
stable overall by history and exam, recent data reviewed with pt, and pt to continue medical treatment as before,  to f/u any worsening symptoms or concerns Lab Results  Component Value Date   LDLCALC 107* 08/05/2013

## 2014-02-20 ENCOUNTER — Other Ambulatory Visit: Payer: Self-pay | Admitting: Internal Medicine

## 2014-02-23 NOTE — Telephone Encounter (Signed)
Done hardcopy to robin  

## 2014-02-23 NOTE — Telephone Encounter (Signed)
Faxed hardcopy for Temazepam to [facility]

## 2014-02-24 ENCOUNTER — Other Ambulatory Visit: Payer: Self-pay | Admitting: Internal Medicine

## 2014-03-18 ENCOUNTER — Other Ambulatory Visit: Payer: Medicare Other

## 2014-03-18 ENCOUNTER — Encounter: Payer: Medicare Other | Admitting: Oncology

## 2014-03-23 ENCOUNTER — Ambulatory Visit
Admission: RE | Admit: 2014-03-23 | Discharge: 2014-03-23 | Disposition: A | Discharge status: Home / Self Care | Payer: Medicare Other | Source: Ambulatory Visit | Attending: Internal Medicine | Admitting: Internal Medicine

## 2014-03-23 DIAGNOSIS — M542 Cervicalgia: Secondary | ICD-10-CM

## 2014-03-23 DIAGNOSIS — R202 Paresthesia of skin: Secondary | ICD-10-CM

## 2014-03-25 ENCOUNTER — Other Ambulatory Visit: Payer: Self-pay | Admitting: Internal Medicine

## 2014-03-25 ENCOUNTER — Encounter: Payer: Self-pay | Admitting: Internal Medicine

## 2014-03-25 DIAGNOSIS — M542 Cervicalgia: Secondary | ICD-10-CM

## 2014-04-01 ENCOUNTER — Other Ambulatory Visit (HOSPITAL_BASED_OUTPATIENT_CLINIC_OR_DEPARTMENT_OTHER): Payer: Medicare Other

## 2014-04-01 ENCOUNTER — Ambulatory Visit (HOSPITAL_BASED_OUTPATIENT_CLINIC_OR_DEPARTMENT_OTHER): Payer: Medicare Other | Admitting: Nurse Practitioner

## 2014-04-01 ENCOUNTER — Encounter: Payer: Self-pay | Admitting: Nurse Practitioner

## 2014-04-01 VITALS — BP 121/65 | HR 66 | Temp 97.3°F | Resp 20 | Wt 194.0 lb

## 2014-04-01 DIAGNOSIS — C50911 Malignant neoplasm of unspecified site of right female breast: Secondary | ICD-10-CM

## 2014-04-01 DIAGNOSIS — C50919 Malignant neoplasm of unspecified site of unspecified female breast: Secondary | ICD-10-CM

## 2014-04-01 DIAGNOSIS — C779 Secondary and unspecified malignant neoplasm of lymph node, unspecified: Secondary | ICD-10-CM | POA: Diagnosis not present

## 2014-04-01 DIAGNOSIS — Z170 Estrogen receptor positive status [ER+]: Secondary | ICD-10-CM | POA: Diagnosis not present

## 2014-04-01 DIAGNOSIS — C7800 Secondary malignant neoplasm of unspecified lung: Secondary | ICD-10-CM

## 2014-04-01 LAB — CBC WITH DIFFERENTIAL/PLATELET
BASO%: 0.1 % (ref 0.0–2.0)
Basophils Absolute: 0 10*3/uL (ref 0.0–0.1)
EOS ABS: 0.3 10*3/uL (ref 0.0–0.5)
EOS%: 3.9 % (ref 0.0–7.0)
HCT: 43.4 % (ref 34.8–46.6)
HEMOGLOBIN: 14.4 g/dL (ref 11.6–15.9)
LYMPH%: 29.8 % (ref 14.0–49.7)
MCH: 31.4 pg (ref 25.1–34.0)
MCHC: 33.2 g/dL (ref 31.5–36.0)
MCV: 94.6 fL (ref 79.5–101.0)
MONO#: 0.3 10*3/uL (ref 0.1–0.9)
MONO%: 4.9 % (ref 0.0–14.0)
NEUT%: 61.3 % (ref 38.4–76.8)
NEUTROS ABS: 4.1 10*3/uL (ref 1.5–6.5)
PLATELETS: 241 10*3/uL (ref 145–400)
RBC: 4.59 10*6/uL (ref 3.70–5.45)
RDW: 12.5 % (ref 11.2–14.5)
WBC: 6.7 10*3/uL (ref 3.9–10.3)
lymph#: 2 10*3/uL (ref 0.9–3.3)

## 2014-04-01 LAB — COMPREHENSIVE METABOLIC PANEL (CC13)
ALT: 14 U/L (ref 0–55)
AST: 23 U/L (ref 5–34)
Albumin: 3.6 g/dL (ref 3.5–5.0)
Alkaline Phosphatase: 67 U/L (ref 40–150)
Anion Gap: 12 mEq/L — ABNORMAL HIGH (ref 3–11)
BILIRUBIN TOTAL: 0.74 mg/dL (ref 0.20–1.20)
BUN: 23.1 mg/dL (ref 7.0–26.0)
CO2: 24 meq/L (ref 22–29)
Calcium: 9.3 mg/dL (ref 8.4–10.4)
Chloride: 106 mEq/L (ref 98–109)
Creatinine: 1.2 mg/dL — ABNORMAL HIGH (ref 0.6–1.1)
EGFR: 43 mL/min/{1.73_m2} — ABNORMAL LOW (ref >90)
Glucose: 97 mg/dl (ref 70–140)
Potassium: 4 mEq/L (ref 3.5–5.1)
SODIUM: 142 meq/L (ref 136–145)
TOTAL PROTEIN: 6.7 g/dL (ref 6.4–8.3)
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
## 2014-04-13 ENCOUNTER — Other Ambulatory Visit: Payer: Self-pay | Admitting: Physical Medicine & Rehabilitation

## 2014-04-13 ENCOUNTER — Encounter: Payer: Self-pay | Admitting: Physical Medicine & Rehabilitation

## 2014-04-13 ENCOUNTER — Encounter: Payer: Medicare Other | Attending: Physical Medicine & Rehabilitation | Admitting: Physical Medicine & Rehabilitation

## 2014-04-13 VITALS — BP 139/91 | HR 78 | Resp 14

## 2014-04-13 DIAGNOSIS — Z5181 Encounter for therapeutic drug level monitoring: Secondary | ICD-10-CM | POA: Diagnosis present

## 2014-04-13 DIAGNOSIS — M179 Osteoarthritis of knee, unspecified: Secondary | ICD-10-CM

## 2014-04-13 DIAGNOSIS — M1711 Unilateral primary osteoarthritis, right knee: Secondary | ICD-10-CM

## 2014-04-13 DIAGNOSIS — Z79899 Other long term (current) drug therapy: Secondary | ICD-10-CM

## 2014-04-13 DIAGNOSIS — M1611 Unilateral primary osteoarthritis, right hip: Secondary | ICD-10-CM

## 2014-04-13 DIAGNOSIS — G894 Chronic pain syndrome: Secondary | ICD-10-CM | POA: Insufficient documentation

## 2014-04-13 DIAGNOSIS — M47816 Spondylosis without myelopathy or radiculopathy, lumbar region: Secondary | ICD-10-CM | POA: Diagnosis not present

## 2014-04-13 DIAGNOSIS — M1712 Unilateral primary osteoarthritis, left knee: Secondary | ICD-10-CM

## 2014-04-13 MED ORDER — OXYCODONE-ACETAMINOPHEN 7.5-325 MG PO TABS: 1.0000 | ORAL_TABLET | Freq: Three times a day (TID) | ORAL | Status: DC | PRN

## 2014-04-13 NOTE — Addendum Note (Signed)
Addended by: Geryl Rankins D on: 04/13/2014 01:40 PM   Modules accepted: Orders

## 2014-04-13 NOTE — Progress Notes (Signed)
Subjective:    Patient ID: Rose Sullivan, female    DOB: 06-Dec-1933, 81 y.o.   MRN: [id number]  HPI   Rose Sullivan is here regarding her ongoing pain. She has continued to struggle somewhat with her right hip, knees and low back. She had a fall which precipitated an MRI of the c-spine last month--it was negative.  She is doing little at home for exercise. The percocet seems to help, but she typically needs 4 per day to cover her pain.   Rose Sullivan's activity is essentially limited to a short walk to the bathroom, otherwise she stays in her recliner or w/c   Pain Inventory Average Pain 7 Pain Right Now 7 My pain is sharp, stabbing and aching  In the last 24 hours, has pain interfered with the following? General activity 7 Relation with others 1 Enjoyment of life 5 What TIME of day is your pain at its worst? night Sleep (in general) Poor  Pain is worse with: walking, bending, standing and some activites Pain improves with: medication Relief from Meds: 5  Mobility walk with assistance use a walker how many minutes can you walk? 0 ability to climb steps?  no do you drive?  no use a wheelchair needs help with transfers Do you have any goals in this area?  yes  Function disabled: date disabled . I need assistance with the following:  dressing, bathing, toileting, meal prep, household duties and shopping  Neuro/Psych bladder control problems numbness tingling trouble walking  Prior Studies Any changes since last visit?  no  Physicians involved in your care Any changes since last visit?  no   Family History  Problem Relation Age of Onset  . Hypertension Brother   . Lung cancer Brother   . Colon cancer Father   . COPD Brother   . Stroke Mother    History   Social History  . Marital Status: Widowed    Spouse Name: N/A  . Number of Children: 1  . Years of Education: N/A   Occupational History  .     Social History Main Topics  . Smoking  status: Former Research scientist (life sciences)  . Smokeless tobacco: Never Used     Comment: quit 40 years ago  . Alcohol Use: No  . Drug Use: No  . Sexual Activity: Not on file   Other Topics Concern  . None   Social History Narrative   Lives with daughter   Past Surgical History  Procedure Laterality Date  . Dilation and curettage of uterus    . S/p mastectomy    . Appendectomy     Past Medical History  Diagnosis Date  . GLUCOSE INTOLERANCE 02/11/2007  . HYPERLIPIDEMIA 02/11/2007  . BLINDNESS, [place], [place] DEFINITION 11/25/2006  . Other specified forms of hearing loss 09/05/2009  . HYPERTENSION 02/11/2007  . Atrial fibrillation 03/07/2009  . GERD 02/11/2007  . LOW BACK PAIN, CHRONIC 11/25/2006  . BACK PAIN 12/10/2007  . Pain in Soft Tissues of Limb 02/11/2007  . OSTEOPENIA 02/11/2007  . FATIGUE 12/10/2007  . GAIT DISTURBANCE 12/10/2007  . DYSPNEA 03/18/2009  . Cough 03/07/2009  . CHEST PAIN 12/10/2007  . Abdominal pain, left lower quadrant 04/01/2007  . BREAST CANCER, HX OF 11/25/2006  . Nocturia 05/18/2010  . Hypersomnia 05/18/2010  . Impaired glucose tolerance 05/18/2010  . Allergic rhinitis, cause unspecified 07/23/2012  . Anxiety state, unspecified 02/04/2013   BP 139/91 mmHg  Pulse 78  Resp 14  SpO2 95%  Opioid Risk Score:   Fall Risk Score: High Fall Risk (>13 points) (patient previously educated)   Review of Systems  Genitourinary: Positive for urgency.       Incontinence  Musculoskeletal: Positive for gait problem.  Neurological: Positive for numbness.       Tingling  All other systems reviewed and are negative.      Objective:   Physical Exam General: Alert and oriented x 3, No apparent distress. Morbidly obese.  HEENT: Head is normocephalic, atraumatic, PERRLA, EOMI, sclera anicteric, oral mucosa pink and moist, dentition intact, ext ear canals clear,  Neck: Supple without JVD or lymphadenopathy  Heart: Reg rate and rhythm. No murmurs rubs or gallops  Chest: CTA bilaterally without  wheezes, rales, or rhonchi; no distress  Abdomen: Soft, non-tender, non-distended, bowel sounds positive.  Extremities: No clubbing, cyanosis, trace lower extremity edema. Pulses are 2+  Skin: Clean and intact without signs of breakdown.  Neuro: Pt able to track simple objects, primarily using her right eye. Gaze dysconjugate. Sensation was decreased to PP and LT in both hands to the wrists and both feet up to the knees in a stocking glove distribution. dtr's are 1+. Motor function is grossly 5/5.  Musculoskeletal: crepitus in both knees. Pain with meniscal maneuvers. She stood with min assist and had difficulty with neutral stance, keeping a flexed posture at the waist most of the time. Her lumbar and thoracic spine are both rounded. Psych: Pt's affect is calm and pleasant. She was in good spirits.   Assessment & Plan:   1. Osteoarthritis of bilateral knees, right greater than left.  2. Osteoarthritis right hip  3. Facet arthropathy of lumbar and cervical spine--recent MRI of cervical spine reveals substantial DJD.  4. Peripheral polyneuropathy, quite possibly secondary to past chemotherapy  5. Morbid obesity  6. Legally blind  7. HTN    Plan:  1. Percocet 7.5/325 one q6 prn #120 were written today 2. Continue gabapentin 100mg  BID to TID.  3. Voltaren gel to hands, knees. i encouraged her to apply the gel by herself to her knees in order to get the benefits on her hands at the same time  4. Discussed HEP. She has to be committed to a basic program before I will refer her to therapy. She is doing nothing at home currently. We outlined some basic things she could work on at home, and I tried to put some of the onus on her to help initiate more. 5. Follow up with my NP in about 1 months. 30 minutes of face to face patient care time were spent during this visit. All questions were encouraged and answered.

## 2014-04-13 NOTE — Patient Instructions (Signed)
TO GET STRONGER AND MOVE BETTER AND TO FEEL BETTER, IT REQUIRES DAILY WORK TO IMPROVE YOUR STRENGTH, RANGE OF MOTION, POSTURE, AND MOBILITY!!!!!!    30 MINUTES PER DAY.

## 2014-04-14 LAB — PMP ALCOHOL METABOLITE (ETG): Ethyl Glucuronide (EtG): NEGATIVE ng/mL

## 2014-04-17 LAB — PRESCRIPTION MONITORING PROFILE (SOLSTAS)
AMPHETAMINE/METH: NEGATIVE ng/mL
Barbiturate Screen, Urine: NEGATIVE ng/mL
Benzodiazepine Screen, Urine: NEGATIVE ng/mL
Buprenorphine, Urine: NEGATIVE ng/mL
CANNABINOID SCRN UR: NEGATIVE ng/mL
CREATININE, URINE: 445.03 mg/dL (ref >20.0)
Carisoprodol, Urine: NEGATIVE ng/mL
Cocaine Metabolites: NEGATIVE ng/mL
Fentanyl, Ur: NEGATIVE ng/mL
MDMA URINE: NEGATIVE ng/mL
Meperidine, Ur: NEGATIVE ng/mL
Methadone Screen, Urine: NEGATIVE ng/mL
Nitrites, Initial: NEGATIVE ug/mL
Propoxyphene: NEGATIVE ng/mL
TAPENTADOLUR: NEGATIVE ng/mL
Tramadol Scrn, Ur: NEGATIVE ng/mL
Zolpidem, Urine: NEGATIVE ng/mL
pH, Initial: 5.5 pH (ref 4.5–8.9)

## 2014-04-17 LAB — OPIATES/OPIOIDS (LC/MS-MS)
Codeine Urine: NEGATIVE ng/mL (ref <50)
HYDROCODONE: NEGATIVE ng/mL (ref <50)
Hydromorphone: NEGATIVE ng/mL (ref <50)
MORPHINE: NEGATIVE ng/mL (ref <50)
Norhydrocodone, Ur: NEGATIVE ng/mL (ref <50)
Noroxycodone, Ur: >10000 ng/mL (ref <50)
OXYMORPHONE, URINE: 1871 ng/mL (ref <50)
Oxycodone, ur: 20550 ng/mL (ref <50)

## 2014-04-17 LAB — OXYCODONE, URINE (LC/MS-MS)
Noroxycodone, Ur: >10000 ng/mL (ref <50)
Oxycodone, ur: 20550 ng/mL (ref <50)
Oxymorphone: 1871 ng/mL (ref <50)

## 2014-05-05 NOTE — Progress Notes (Signed)
Urine drug screen for this encounter is consistent for prescribed medication 

## 2014-05-12 ENCOUNTER — Encounter: Payer: Medicare Other | Attending: Physical Medicine & Rehabilitation | Admitting: Registered Nurse

## 2014-05-12 DIAGNOSIS — Z5181 Encounter for therapeutic drug level monitoring: Secondary | ICD-10-CM | POA: Insufficient documentation

## 2014-05-12 DIAGNOSIS — Z79899 Other long term (current) drug therapy: Secondary | ICD-10-CM | POA: Insufficient documentation

## 2014-05-12 DIAGNOSIS — G894 Chronic pain syndrome: Secondary | ICD-10-CM | POA: Insufficient documentation

## 2014-05-13 ENCOUNTER — Ambulatory Visit (INDEPENDENT_AMBULATORY_CARE_PROVIDER_SITE_OTHER): Payer: Medicare Other | Admitting: Family Medicine

## 2014-05-13 ENCOUNTER — Encounter: Payer: Self-pay | Admitting: Family Medicine

## 2014-05-13 VITALS — BP 132/84 | HR 104 | Ht 60.0 in | Wt 195.0 lb

## 2014-05-13 DIAGNOSIS — M5412 Radiculopathy, cervical region: Secondary | ICD-10-CM

## 2014-05-13 NOTE — Assessment & Plan Note (Signed)
Patient's radicular symptoms is likely secondary to the spinal stenosis as well as a facet arthropathy. Patient was given different choices. We discussed the possibility of getting patient for epidural steroid injections but she states she would like to avoid this at the moment. We discussed over-the-counter natural supplementation including vitamin D supplementation the can be beneficial. Patient is already taking pain medications and I do not feel safe increasing this. Patient already has some difficulty with alertness. Patient is on a blood thinner sole anti-inflammatories are not possible the patient was given a trial of topical anti-inflammatory to see if this will be helpful. We discussed the possibility of formal physical therapy but it is difficult for patient to go anywhere significant injury to her other comorbidities. Patient will try the home exercises and we'll see if she makes any improvement and I will call if any worsening symptoms. Patient does have worsening symptoms I would like to start with epidural steroid injections of the cervical spine mostly at C7-T1.

## 2014-05-13 NOTE — Patient Instructions (Signed)
Good to see you.  Ice 10 minutes 2 times daily. Usually after activity and before bed. Exercises 3 times a week.  Vitamin D 2000 IU daily Fish oil 1 gram 3 times a week and alternate with turmeric 500mg  daily the other 3 days  Pennsaid twice daily topically as needed continue the gabapentin Call me in 3 weeks and if not a lot better then we need to consider epidural.

## 2014-05-13 NOTE — Progress Notes (Signed)
Pre visit review using our clinic review tool, if applicable. No additional management support is needed unless otherwise documented below in the visit note. 

## 2014-05-13 NOTE — Progress Notes (Signed)
[facility] [address] [place], [place] [address] Phone: [phone] Subjective:    I'm seeing this patient by the request  of:  [name], MD   CC: neck pain JJH:ERDEYCXKGY VERLEY PARISEAU is a 81 y.o. female coming in with complaint of neck pain. Patient has had neck pain for quite some time and was having more radicular symptoms. Patient was seen by primary care provider and has been treated multiple times including started on gabapentin and has had prednisone. Patient is also on chronic narcotics. Patient has significant number of other comorbidities including atrial fibrillation, we'll blindness, breast cancer with metastasis to the lung and lumbar facet arthropathy. Patient states that certain movements seem to make it worse. Seems to go down the arms bilaterally all the way to her hands mostly in her middle finger to thumb bilaterally she states. Sometimes can even have some mild weakness in her hands. Patient states since starting the gabapentin no significant improvement. Patient rates the severity of pain a 6 out of 10.  Patient was having such pain she was sent for MRI. Patient's MRI of the neck showed multifactorial's cervical spinal stenosis with severe foraminal stenosis bilaterally at C4-C7.    Past Medical History  Diagnosis Date  . GLUCOSE INTOLERANCE 02/11/2007  . HYPERLIPIDEMIA 02/11/2007  . BLINDNESS, [place], [place] DEFINITION 11/25/2006  . Other specified forms of hearing loss 09/05/2009  . HYPERTENSION 02/11/2007  . Atrial fibrillation 03/07/2009  . GERD 02/11/2007  . LOW BACK PAIN, CHRONIC 11/25/2006  . BACK PAIN 12/10/2007  . Pain in Soft Tissues of Limb 02/11/2007  . OSTEOPENIA 02/11/2007  . FATIGUE 12/10/2007  . GAIT DISTURBANCE 12/10/2007  . DYSPNEA 03/18/2009  . Cough 03/07/2009  . CHEST PAIN 12/10/2007  . Abdominal pain, left lower quadrant 04/01/2007  . BREAST CANCER, HX OF 11/25/2006  . Nocturia 05/18/2010  . Hypersomnia 05/18/2010    . Impaired glucose tolerance 05/18/2010  . Allergic rhinitis, cause unspecified 07/23/2012  . Anxiety state, unspecified 02/04/2013   Past Surgical History  Procedure Laterality Date  . Dilation and curettage of uterus    . S/p mastectomy    . Appendectomy     History   Social History  . Marital Status: Widowed    Spouse Name: N/A  . Number of Children: 1  . Years of Education: N/A   Occupational History  .     Social History Main Topics  . Smoking status: Former Research scientist (life sciences)  . Smokeless tobacco: Never Used     Comment: quit 40 years ago  . Alcohol Use: No  . Drug Use: No  . Sexual Activity: Not on file   Other Topics Concern  . Not on file   Social History Narrative   Lives with daughter   Family History  Problem Relation Age of Onset  . Hypertension Brother   . Lung cancer Brother   . Colon cancer Father   . COPD Brother   . Stroke Mother    Allergies  Allergen Reactions  . Trazodone And Nefazodone Other (See Comments)    hallucinations     Past medical history, social, surgical and family history all reviewed in electronic medical record.   Review of Systems: No headache, visual changes, nausea, vomiting, diarrhea, constipation, dizziness, abdominal pain, skin rash, fevers, chills, night sweats, weight loss, swollen lymph nodes, body aches, joint swelling, muscle aches, chest pain, shortness of breath, mood changes.   Objective Blood pressure 132/84, pulse 104,  height 5' (1.524 m), weight 195 lb (88.451 kg), SpO2 95 %.  General: No apparent distress alert and oriented x2, mildly disheveled.  HEENT: legally blind in left eye with non-responsive pupils. Respiratory: Patient's speak in full sentences and does not appear short of breath  Cardiovascular: moderate 2+ swelling of the lower extremity bilaterally but symmetric Skin: Warm dry intact with no signs of infection or rash on extremities or on axial skeleton Dry skin noted. .  Abdomen: Soft nontender  obese Lymph: No lymphadenopathy of posterior or anterior cervical chain or axillae bilaterally.  Gait sitting in wheelchair MSK:  Non tender with full range of motion and good stability and symmetric strength and tone of shoulders, elbows, wrist, hip, knee and ankles bilaterally. Osteophytic changes of multiple joints Neck: Inspection unremarkable. No palpable stepoffs. positiveSpurling's maneuverbilaterally. Full neck range of motion Grip strength and sensation normal in bilateral hands Strength week with 4 out of 5 strength from C7 and T1 bilaterally No sensory change to C4 to T1 Negative Hoffman sign bilaterally Reflexes normal  mild muscle wasting of the hyperthenar bilaterally. Impression and Recommendations:     This case required medical decision making of moderate complexity.

## 2014-05-14 ENCOUNTER — Other Ambulatory Visit: Payer: Self-pay | Admitting: Internal Medicine

## 2014-05-25 ENCOUNTER — Encounter: Payer: Self-pay | Admitting: Registered Nurse

## 2014-05-25 ENCOUNTER — Encounter (HOSPITAL_BASED_OUTPATIENT_CLINIC_OR_DEPARTMENT_OTHER): Payer: Medicare Other | Admitting: Registered Nurse

## 2014-05-25 VITALS — BP 159/78 | HR 81

## 2014-05-25 DIAGNOSIS — M47816 Spondylosis without myelopathy or radiculopathy, lumbar region: Secondary | ICD-10-CM | POA: Diagnosis not present

## 2014-05-25 DIAGNOSIS — M1712 Unilateral primary osteoarthritis, left knee: Secondary | ICD-10-CM

## 2014-05-25 DIAGNOSIS — M179 Osteoarthritis of knee, unspecified: Secondary | ICD-10-CM

## 2014-05-25 DIAGNOSIS — G894 Chronic pain syndrome: Secondary | ICD-10-CM

## 2014-05-25 DIAGNOSIS — M1711 Unilateral primary osteoarthritis, right knee: Secondary | ICD-10-CM | POA: Diagnosis not present

## 2014-05-25 DIAGNOSIS — M1611 Unilateral primary osteoarthritis, right hip: Secondary | ICD-10-CM

## 2014-05-25 DIAGNOSIS — Z5181 Encounter for therapeutic drug level monitoring: Secondary | ICD-10-CM

## 2014-05-25 DIAGNOSIS — Z79899 Other long term (current) drug therapy: Secondary | ICD-10-CM

## 2014-05-25 MED ORDER — OXYCODONE-ACETAMINOPHEN 7.5-325 MG PO TABS: 1.0000 | ORAL_TABLET | Freq: Three times a day (TID) | ORAL | Status: DC | PRN

## 2014-05-25 NOTE — Progress Notes (Signed)
Subjective:    Patient ID: Rose Sullivan, female    DOB: Jun 23, 1933, 81 y.o.   MRN: [id number]  HPI: Rose Sullivan is a 81 year old female who returns for follow up for chronic pain and medication refill. She says her pain is located in her right hip, right knee and right leg. She rates her pain 7. Her current exercise regime is chair exercises and performing stretching exercises. She's wheelchair and chair bound.  Pain Inventory Average Pain 7 Pain Right Now 7 My pain is constant  In the last 24 hours, has pain interfered with the following? General activity 7 Relation with others 7 Enjoyment of life 7 What TIME of day is your pain at its worst? evening Sleep (in general) Poor  Pain is worse with: walking, bending, sitting and standing Pain improves with: medication Relief from Meds: 7  Mobility walk with assistance use a walker use a wheelchair needs help with transfers  Function I need assistance with the following:  dressing, bathing, toileting, meal prep, household duties and shopping  Neuro/Psych bladder control problems numbness trouble walking  Prior Studies Any changes since last visit?  no  Physicians involved in your care Any changes since last visit?  no   Family History  Problem Relation Age of Onset  . Hypertension Brother   . Lung cancer Brother   . Colon cancer Father   . COPD Brother   . Stroke Mother    History   Social History  . Marital Status: Widowed    Spouse Name: N/A  . Number of Children: 1  . Years of Education: N/A   Occupational History  .     Social History Main Topics  . Smoking status: Former Research scientist (life sciences)  . Smokeless tobacco: Never Used     Comment: quit 40 years ago  . Alcohol Use: No  . Drug Use: No  . Sexual Activity: Not on file   Other Topics Concern  . None   Social History Narrative   Lives with daughter   Past Surgical History  Procedure Laterality Date  . Dilation and curettage of uterus     . S/p mastectomy    . Appendectomy     Past Medical History  Diagnosis Date  . GLUCOSE INTOLERANCE 02/11/2007  . HYPERLIPIDEMIA 02/11/2007  . BLINDNESS, [place], [place] DEFINITION 11/25/2006  . Other specified forms of hearing loss 09/05/2009  . HYPERTENSION 02/11/2007  . Atrial fibrillation 03/07/2009  . GERD 02/11/2007  . LOW BACK PAIN, CHRONIC 11/25/2006  . BACK PAIN 12/10/2007  . Pain in Soft Tissues of Limb 02/11/2007  . OSTEOPENIA 02/11/2007  . FATIGUE 12/10/2007  . GAIT DISTURBANCE 12/10/2007  . DYSPNEA 03/18/2009  . Cough 03/07/2009  . CHEST PAIN 12/10/2007  . Abdominal pain, left lower quadrant 04/01/2007  . BREAST CANCER, HX OF 11/25/2006  . Nocturia 05/18/2010  . Hypersomnia 05/18/2010  . Impaired glucose tolerance 05/18/2010  . Allergic rhinitis, cause unspecified 07/23/2012  . Anxiety state, unspecified 02/04/2013   There were no vitals taken for this visit.  Opioid Risk Score:   Fall Risk Score: High Fall Risk (>13 points) (previously educated and given handout.  re educated  and has home health educateing her as well)`1  Depression screen PHQ 2/9  Depression screen [facility] 2/9 04/13/2014 08/05/2013  Decreased Interest 2 0  Down, Depressed, Hopeless 0 1  PHQ - 2 Score 2 1  Altered sleeping 2 -  Tired, decreased energy 3 -  Change in appetite 0 -  Feeling bad or failure about yourself  0 -  Trouble concentrating 0 -  Moving slowly or fidgety/restless 0 -  Suicidal thoughts 0 -  PHQ-9 Score 7 -     Review of Systems  Respiratory: Positive for shortness of breath.   Genitourinary: Positive for frequency.       Bladder control problems  Musculoskeletal: Positive for gait problem.  Neurological: Positive for numbness.  All other systems reviewed and are negative.      Objective:   Physical Exam  Constitutional: She is oriented to person, place, and time. She appears well-developed and well-nourished.  HENT:  Head: Normocephalic and atraumatic.  Neck: Normal range of  motion. Neck supple.  Cardiovascular: Normal rate and regular rhythm.   Pulmonary/Chest: Effort normal and breath sounds normal.  Musculoskeletal:  Normal Muscle Bulk and Muscle Testing Reveals: Upper Extremities: Full ROM and Muscle Strength 5/5 Lumbar Paraspinal Tenderness: L-4- L-5 Right Greater Trochanteric Tenderness Lower Extremities: Left: Full ROM and Muscle Strength 5/5 Right: Decreased ROM and Right Lower Extremity Flexion Produces pain into Right Hip and Patella Arrived in wheelchair  Neurological: She is alert and oriented to person, place, and time.  Skin: Skin is warm and dry.  Psychiatric: She has a normal mood and affect.  Nursing note and vitals reviewed.         Assessment & Plan:  1. Osteoarthritis of bilateral knees, right greater than left.  Refilled: Oxycodone-acetaminophen 7.5/325mg  one tablet every 8 hours as needed. May take an extra tablet when pain is sever. No more than 4 a day. 2. Osteoarthritis right hip: Continue Voltaren gel four times a day. Continue heat and exercise  3. Facet arthropathy of lumbar and cervical spine : Continue current medication regime. Continue to monitor 4. Peripheral polyneuropathy, quite possibly secondary to past chemotherapy: Continue Gabapentin 5. Morbid obesity : Encouraged to increase activity, and start chair exercises. 6. Legally blind: Continue to monitor. Daughter with patient  20 minutes of face to face patient care time was spent during this visit. All questions were encouraged and answered.  F/U in 1 month

## 2014-06-10 ENCOUNTER — Telehealth: Payer: Self-pay

## 2014-06-10 NOTE — Telephone Encounter (Signed)
PA came in for pt for ELIQUIS.   Called pharmacy for the [facility] [phone]), PCN [phone]) and Grp ([id number])  Pharmacist indicated the pt has not had rx filled in over a year.  Should this PA be completed? Does this need to be removed from the med list?

## 2014-06-11 NOTE — Telephone Encounter (Signed)
I believe she has not been taking due to risk of fall.  Ok to hold further med.

## 2014-06-21 ENCOUNTER — Encounter: Payer: Self-pay | Admitting: Registered Nurse

## 2014-06-21 ENCOUNTER — Encounter: Payer: Medicare Other | Attending: Physical Medicine & Rehabilitation | Admitting: Registered Nurse

## 2014-06-21 VITALS — BP 126/58 | HR 80 | Resp 14

## 2014-06-21 DIAGNOSIS — Z5181 Encounter for therapeutic drug level monitoring: Secondary | ICD-10-CM | POA: Diagnosis present

## 2014-06-21 DIAGNOSIS — M1611 Unilateral primary osteoarthritis, right hip: Secondary | ICD-10-CM

## 2014-06-21 DIAGNOSIS — M47816 Spondylosis without myelopathy or radiculopathy, lumbar region: Secondary | ICD-10-CM | POA: Diagnosis not present

## 2014-06-21 DIAGNOSIS — M1711 Unilateral primary osteoarthritis, right knee: Secondary | ICD-10-CM | POA: Diagnosis not present

## 2014-06-21 DIAGNOSIS — Z79899 Other long term (current) drug therapy: Secondary | ICD-10-CM

## 2014-06-21 DIAGNOSIS — G894 Chronic pain syndrome: Secondary | ICD-10-CM | POA: Insufficient documentation

## 2014-06-21 MED ORDER — OXYCODONE-ACETAMINOPHEN 7.5-325 MG PO TABS: 1.0000 | ORAL_TABLET | Freq: Three times a day (TID) | ORAL | Status: DC | PRN

## 2014-06-21 NOTE — Progress Notes (Signed)
Subjective:    Patient ID: Rose Sullivan, female    DOB: [birth year], 81 y.o.   MRN: [id number]  HPI: Rose Sullivan is a 81 year old female who returns for follow up for chronic pain and medication refill. She says her pain is located in her right hip, lower back,right knee and right leg. She rates her pain 6. Her current exercise regime is performing chair exercises and  stretching exercises. She's wheelchair and chair bound. Arrived in wheelchair.  Pain Inventory Average Pain 7 Pain Right Now 6 My pain is constant, sharp, stabbing and aching  In the last 24 hours, has pain interfered with the following? General activity 6 Relation with others 6 Enjoyment of life 6 What TIME of day is your pain at its worst? morning  And evening Sleep (in general) Fair  Pain is worse with: walking, bending, standing and some activites Pain improves with: rest and medication Relief from Meds: 6  Mobility walk with assistance use a walker how many minutes can you walk? 1-2 ability to climb steps?  no do you drive?  no use a wheelchair  Function retired  Neuro/Psych weakness trouble walking  Prior Studies Any changes since last visit?  no  Physicians involved in your care Any changes since last visit?  no   Family History  Problem Relation Age of Onset  . Hypertension Brother   . Lung cancer Brother   . Colon cancer Father   . COPD Brother   . Stroke Mother    History   Social History  . Marital Status: Widowed    Spouse Name: N/A  . Number of Children: 1  . Years of Education: N/A   Occupational History  .     Social History Main Topics  . Smoking status: Former Research scientist (life sciences)  . Smokeless tobacco: Never Used     Comment: quit 40 years ago  . Alcohol Use: No  . Drug Use: No  . Sexual Activity: Not on file   Other Topics Concern  . None   Social History Narrative   Lives with daughter   Past Surgical History  Procedure Laterality Date  . Dilation  and curettage of uterus    . S/p mastectomy    . Appendectomy     Past Medical History  Diagnosis Date  . GLUCOSE INTOLERANCE 02/11/2007  . HYPERLIPIDEMIA 02/11/2007  . BLINDNESS, [place], [place] DEFINITION 11/25/2006  . Other specified forms of hearing loss 09/05/2009  . HYPERTENSION 02/11/2007  . Atrial fibrillation 03/07/2009  . GERD 02/11/2007  . LOW BACK PAIN, CHRONIC 11/25/2006  . BACK PAIN 12/10/2007  . Pain in Soft Tissues of Limb 02/11/2007  . OSTEOPENIA 02/11/2007  . FATIGUE 12/10/2007  . GAIT DISTURBANCE 12/10/2007  . DYSPNEA 03/18/2009  . Cough 03/07/2009  . CHEST PAIN 12/10/2007  . Abdominal pain, left lower quadrant 04/01/2007  . BREAST CANCER, HX OF 11/25/2006  . Nocturia 05/18/2010  . Hypersomnia 05/18/2010  . Impaired glucose tolerance 05/18/2010  . Allergic rhinitis, cause unspecified 07/23/2012  . Anxiety state, unspecified 02/04/2013   BP 126/58 mmHg  Pulse 80  Resp 14  SpO2 94%  Opioid Risk Score:   Fall Risk Score: Moderate Fall Risk (6-13 points)`1  Depression screen PHQ 2/9  Depression screen [facility] 2/9 04/13/2014 08/05/2013  Decreased Interest 2 0  Down, Depressed, Hopeless 0 1  PHQ - 2 Score 2 1  Altered sleeping 2 -  Tired, decreased energy 3 -  Change  in appetite 0 -  Feeling bad or failure about yourself  0 -  Trouble concentrating 0 -  Moving slowly or fidgety/restless 0 -  Suicidal thoughts 0 -  PHQ-9 Score 7 -     Review of Systems  HENT: Negative.   Eyes: Negative.   Respiratory: Negative.   Cardiovascular: Negative.   Gastrointestinal: Negative.   Endocrine: Negative.   Genitourinary: Negative.   Musculoskeletal:       Bilateral hip pain has increased  Skin: Negative.   Allergic/Immunologic: Negative.   Neurological: Positive for weakness.  Hematological: Negative.   Psychiatric/Behavioral: Positive for dysphoric mood.       Objective:   Physical Exam  Constitutional: She is oriented to person, place, and time. She appears well-developed  and well-nourished.  HENT:  Head: Normocephalic and atraumatic.  Neck: Normal range of motion. Neck supple.  Cardiovascular: Normal rate and regular rhythm.   Pulmonary/Chest: Effort normal and breath sounds normal.  Musculoskeletal:  Normal Muscle Bulk and Muscle Testing Reveals: Upper Extremities: Full ROM and Muscle Strength 5/5 Thoracic and Lumbar Hypersensitivity Right Greater Trochanteric Tenderness Lower Extremities: Right: Decreased ROm and Muscle Strength 4/5 Right Lower Extremity Flexion Produces pain into Hip and Patella Left: Full ROM and Muscle Strength 5/5 Arrived in wheelchair  Neurological: She is alert and oriented to person, place, and time.  Skin: Skin is warm and dry.  Psychiatric: She has a normal mood and affect.  Nursing note and vitals reviewed.         Assessment & Plan:  1. Osteoarthritis of bilateral knees, right greater than left.  Refilled: Oxycodone-acetaminophen 7.5/325mg  one tablet every 8 hours as needed. May take an extra tablet when pain is sever. No more than 4 a day. Second script given 2. Osteoarthritis right hip: Continue Voltaren gel four times a day. Continue heat and exercise  3. Facet arthropathy of lumbar and cervical spine : Continue current medication regime. Continue to monitor 4. Peripheral polyneuropathy, quite possibly secondary to past chemotherapy: Continue Gabapentin 5. Morbid obesity : Encouraged to increase activity, and start chair exercises. 6. Legally blind: Continue to monitor. Daughter with patient  20 minutes of face to face patient care time was spent during this visit. All questions were encouraged and answered.  F/U in 1 month

## 2014-06-23 ENCOUNTER — Other Ambulatory Visit: Payer: Self-pay | Admitting: Internal Medicine

## 2014-07-02 ENCOUNTER — Telehealth: Payer: Self-pay | Admitting: Internal Medicine

## 2014-07-08 ENCOUNTER — Other Ambulatory Visit: Payer: Self-pay | Admitting: Internal Medicine

## 2014-08-10 ENCOUNTER — Ambulatory Visit: Payer: Medicare Other | Admitting: Internal Medicine

## 2014-08-12 ENCOUNTER — Other Ambulatory Visit: Payer: Self-pay | Admitting: Internal Medicine

## 2014-08-17 ENCOUNTER — Ambulatory Visit: Payer: Medicare Other | Admitting: Internal Medicine

## 2014-08-17 NOTE — Telephone Encounter (Signed)
Rx signed by Dr. Alain Marion and faxed to pharmacy.

## 2014-08-18 ENCOUNTER — Encounter: Payer: Self-pay | Admitting: Internal Medicine

## 2014-08-18 ENCOUNTER — Encounter: Payer: Self-pay | Admitting: Physical Medicine & Rehabilitation

## 2014-08-18 ENCOUNTER — Encounter: Payer: Medicare Other | Attending: Physical Medicine & Rehabilitation | Admitting: Physical Medicine & Rehabilitation

## 2014-08-18 ENCOUNTER — Ambulatory Visit (INDEPENDENT_AMBULATORY_CARE_PROVIDER_SITE_OTHER): Payer: Medicare Other | Admitting: Internal Medicine

## 2014-08-18 VITALS — BP 134/76 | HR 72 | Temp 98.7°F | Ht 60.0 in | Wt 199.0 lb

## 2014-08-18 VITALS — BP 136/81 | HR 75 | Resp 14

## 2014-08-18 DIAGNOSIS — Z5181 Encounter for therapeutic drug level monitoring: Secondary | ICD-10-CM | POA: Diagnosis present

## 2014-08-18 DIAGNOSIS — Z79899 Other long term (current) drug therapy: Secondary | ICD-10-CM

## 2014-08-18 DIAGNOSIS — I10 Essential (primary) hypertension: Secondary | ICD-10-CM

## 2014-08-18 DIAGNOSIS — G894 Chronic pain syndrome: Secondary | ICD-10-CM

## 2014-08-18 DIAGNOSIS — M1711 Unilateral primary osteoarthritis, right knee: Secondary | ICD-10-CM

## 2014-08-18 DIAGNOSIS — M47816 Spondylosis without myelopathy or radiculopathy, lumbar region: Secondary | ICD-10-CM

## 2014-08-18 DIAGNOSIS — M1712 Unilateral primary osteoarthritis, left knee: Secondary | ICD-10-CM

## 2014-08-18 DIAGNOSIS — E785 Hyperlipidemia, unspecified: Secondary | ICD-10-CM | POA: Diagnosis not present

## 2014-08-18 DIAGNOSIS — R7302 Impaired glucose tolerance (oral): Secondary | ICD-10-CM | POA: Diagnosis not present

## 2014-08-18 DIAGNOSIS — M1611 Unilateral primary osteoarthritis, right hip: Secondary | ICD-10-CM

## 2014-08-18 MED ORDER — OXYCODONE-ACETAMINOPHEN 7.5-325 MG PO TABS: 1.0000 | ORAL_TABLET | Freq: Three times a day (TID) | ORAL | Status: DC | PRN

## 2014-08-18 MED ORDER — TEMAZEPAM 30 MG PO CAPS: 30.0000 mg | ORAL_CAPSULE | Freq: Every evening | ORAL | Status: DC | PRN

## 2014-08-18 NOTE — Assessment & Plan Note (Signed)
stable overall by history and exam, recent data reviewed with pt, and pt to continue medical treatment as before,  to f/u any worsening symptoms or concerns Lab Results  Component Value Date   LDLCALC 107* 08/05/2013

## 2014-08-18 NOTE — Progress Notes (Signed)
Pre visit review using our clinic review tool, if applicable. No additional management support is needed unless otherwise documented below in the visit note. 

## 2014-08-18 NOTE — Assessment & Plan Note (Signed)
stable overall by history and exam, recent data reviewed with pt, and pt to continue medical treatment as before,  to f/u any worsening symptoms or concerns Lab Results  Component Value Date   HGBA1C 5.8 08/05/2013

## 2014-08-18 NOTE — Progress Notes (Signed)
Subjective:    Patient ID: Rose Sullivan, female    DOB: Jun 27, 1933, 81 y.o.   MRN: [id number]  HPI   Mrs Giglia is here in follow up of her chronic pain. She's maintaining fairly well with her current medications. Someone gave her a cream which was a little more helpful than the voltaren gel.   She hasn't made any progress with her HEP and is largely sedentary. She doesn't initiate much at all   Pain Inventory Average Pain 4 Pain Right Now 4 My pain is intermittent, constant, sharp, stabbing and aching  In the last 24 hours, has pain interfered with the following? General activity 8 Relation with others 7 Enjoyment of life 7 What TIME of day is your pain at its worst? evening, night Sleep (in general) Fair  Pain is worse with: walking and standing Pain improves with: medication Relief from Meds: 6  Mobility walk with assistance use a walker how many minutes can you walk? 2 ability to climb steps?  yes do you drive?  no use a wheelchair needs help with transfers Do you have any goals in this area?  yes  Function retired I need assistance with the following:  dressing, bathing, toileting, meal prep, household duties and shopping  Neuro/Psych bladder control problems weakness numbness trouble walking  Prior Studies Any changes since last visit?  no  Physicians involved in your care Any changes since last visit?  no   Family History  Problem Relation Age of Onset  . Hypertension Brother   . Lung cancer Brother   . Colon cancer Father   . COPD Brother   . Stroke Mother    History   Social History  . Marital Status: Widowed    Spouse Name: N/A  . Number of Children: 1  . Years of Education: N/A   Occupational History  .     Social History Main Topics  . Smoking status: Former Research scientist (life sciences)  . Smokeless tobacco: Never Used     Comment: quit 40 years ago  . Alcohol Use: No  . Drug Use: No  . Sexual Activity: Not on file   Other Topics  Concern  . None   Social History Narrative   Lives with daughter   Past Surgical History  Procedure Laterality Date  . Dilation and curettage of uterus    . S/p mastectomy    . Appendectomy     Past Medical History  Diagnosis Date  . GLUCOSE INTOLERANCE 02/11/2007  . HYPERLIPIDEMIA 02/11/2007  . BLINDNESS, [place], [place] DEFINITION 11/25/2006  . Other specified forms of hearing loss 09/05/2009  . HYPERTENSION 02/11/2007  . Atrial fibrillation 03/07/2009  . GERD 02/11/2007  . LOW BACK PAIN, CHRONIC 11/25/2006  . BACK PAIN 12/10/2007  . Pain in Soft Tissues of Limb 02/11/2007  . OSTEOPENIA 02/11/2007  . FATIGUE 12/10/2007  . GAIT DISTURBANCE 12/10/2007  . DYSPNEA 03/18/2009  . Cough 03/07/2009  . CHEST PAIN 12/10/2007  . Abdominal pain, left lower quadrant 04/01/2007  . BREAST CANCER, HX OF 11/25/2006  . Nocturia 05/18/2010  . Hypersomnia 05/18/2010  . Impaired glucose tolerance 05/18/2010  . Allergic rhinitis, cause unspecified 07/23/2012  . Anxiety state, unspecified 02/04/2013   BP 136/81 mmHg  Pulse 75  Resp 14  SpO2 97%  Opioid Risk Score:   Fall Risk Score:  `1  Depression screen PHQ 2/9  Depression screen [facility] 2/9 04/13/2014 08/05/2013  Decreased Interest 2 0  Down, Depressed, Hopeless 0 1  PHQ - 2 Score 2 1  Altered sleeping 2 -  Tired, decreased energy 3 -  Change in appetite 0 -  Feeling bad or failure about yourself  0 -  Trouble concentrating 0 -  Moving slowly or fidgety/restless 0 -  Suicidal thoughts 0 -  PHQ-9 Score 7 -     Review of Systems  Genitourinary: Positive for dysuria.  Musculoskeletal: Positive for gait problem.  Neurological: Positive for weakness and numbness.       Objective:   Physical Exam    General: Alert and oriented x 3, No apparent distress. Morbidly obese.  HEENT: Head is normocephalic, atraumatic, PERRLA, EOMI, sclera anicteric, oral mucosa pink and moist, dentition intact, ext ear canals clear,  Neck: Supple without JVD or  lymphadenopathy  Heart: Reg rate and rhythm. No murmurs rubs or gallops  Chest: CTA bilaterally without wheezes, rales, or rhonchi; no distress  Abdomen: Soft, non-tender, non-distended, bowel sounds positive.  Extremities: No clubbing, cyanosis, trace lower extremity edema. Pulses are 2+  Skin: Clean and intact without signs of breakdown.  Neuro: Pt able to track simple objects, primarily using her right eye. Gaze dysconjugate. Sensation was decreased to PP and LT in both hands to the wrists and both feet up to the knees in a stocking glove distribution. dtr's are 1+. Motor function is grossly 5/5.  Musculoskeletal: crepitus in both knees. Pain with meniscal maneuvers. She stood with min assist and had difficulty with neutral stance, keeping a flexed posture at the waist most of the time. Her lumbar and thoracic spine are both rounded.  Psych: Pt's affect is calm and pleasant. She was in good spirits.    Assessment & Plan:   1. Osteoarthritis of bilateral knees, right greater than left.  2. Osteoarthritis right hip  3. Facet arthropathy of lumbar and cervical spine--recent MRI of cervical spine reveals substantial DJD.  4. Peripheral polyneuropathy, quite possibly secondary to past chemotherapy  5. Morbid obesity  6. Legally blind  7. HTN    Plan:  1. Percocet 7.5/325 one q6 prn #120 were written today as well as an rx for August.  2. Continue gabapentin 100mg  BID to TID.  3. Topical gel for knee pain (per sports medicine?) We discussed many otc supps and diet options as well.   4. Discussed HEP. Again, this is the most important. Issue for her. She needs to improve her stamina and core/LE strength. She will not initate on her own. It's incumbent upon her daughter to push her more.   5. Follow up with my NP in about 2 months. 30 minutes of face to face patient care time were spent during this visit. All questions were encouraged and answered.

## 2014-08-18 NOTE — Progress Notes (Signed)
Subjective:    Patient ID: Rose Sullivan, female    DOB: 1933/07/01, 81 y.o.   MRN: [id number]  HPI  Here for yearly f/u;  Overall doing ok;  Pt denies Chest pain, worsening SOB, DOE, wheezing, orthopnea, PND, worsening LE edema, palpitations, dizziness or syncope.  Pt denies neurological change such as new headache, facial or extremity weakness.  Pt denies polydipsia, polyuria, or low sugar symptoms. Pt states overall good compliance with treatment and medications, good tolerability, and has been trying to follow appropriate diet.  Pt denies worsening depressive symptoms, suicidal ideation or panic. No fever, night sweats, wt loss, loss of appetite, or other constitutional symptoms.  Pt daughter states poor to fair ability with ADL's, has low fall risk, home safety reviewed and adequate, no other significant changes in hearing or vision, can walk with walker, but usually walks from [place] chair in living room, to BR, then bed. .  Reflux resolved, no longer taking PPI.  No current complaints  Off Eliquis due to fall risk  Has several skin tags to neck area. Past Medical History  Diagnosis Date  . GLUCOSE INTOLERANCE 02/11/2007  . HYPERLIPIDEMIA 02/11/2007  . BLINDNESS, [place], [place] DEFINITION 11/25/2006  . Other specified forms of hearing loss 09/05/2009  . HYPERTENSION 02/11/2007  . Atrial fibrillation 03/07/2009  . GERD 02/11/2007  . LOW BACK PAIN, CHRONIC 11/25/2006  . BACK PAIN 12/10/2007  . Pain in Soft Tissues of Limb 02/11/2007  . OSTEOPENIA 02/11/2007  . FATIGUE 12/10/2007  . GAIT DISTURBANCE 12/10/2007  . DYSPNEA 03/18/2009  . Cough 03/07/2009  . CHEST PAIN 12/10/2007  . Abdominal pain, left lower quadrant 04/01/2007  . BREAST CANCER, HX OF 11/25/2006  . Nocturia 05/18/2010  . Hypersomnia 05/18/2010  . Impaired glucose tolerance 05/18/2010  . Allergic rhinitis, cause unspecified 07/23/2012  . Anxiety state, unspecified 02/04/2013   Past Surgical History  Procedure Laterality Date  .  Dilation and curettage of uterus    . S/p mastectomy    . Appendectomy      reports that she has quit smoking. She has never used smokeless tobacco. She reports that she does not drink alcohol or use illicit drugs. family history includes COPD in her brother; Colon cancer in her father; Hypertension in her brother; Lung cancer in her brother; Stroke in her mother. Allergies  Allergen Reactions  . Trazodone And Nefazodone Other (See Comments)    hallucinations   Current Outpatient Prescriptions on File Prior to Visit  Medication Sig Dispense Refill  . diclofenac sodium (VOLTAREN) 1 % GEL Apply 1 application topically 4 (four) times daily. Apply to both knees and hands 3 Tube 4  . diltiazem (CARDIZEM CD) 240 MG 24 hr capsule TAKE ONE CAPSULE BY MOUTH EVERY DAY 30 capsule 6  . exemestane (AROMASIN) 25 MG tablet Take 1 tablet (25 mg total) by mouth daily. 90 tablet 3  . gabapentin (NEURONTIN) 100 MG capsule Take 100 mg by mouth 3 (three) times daily.    Marland Kitchen glucose blood (ONE TOUCH ULTRA TEST) test strip Use as instructed once daily to check blood sugar.  ICD 10 code R73.02 100 each 11  . Lancets (ONETOUCH ULTRASOFT) lancets Use as instructed once daily to check blood sugar.  ICD 10 code R73.02 100 each 11  . losartan (COZAAR) 50 MG tablet Take 50 mg by mouth daily.    Marland Kitchen oxyCODONE-acetaminophen (PERCOCET) 7.5-325 MG per tablet Take 1 tablet by mouth every 8 (eight) hours as needed. 120 tablet  0  . temazepam (RESTORIL) 15 MG capsule Take 1-2 capsules (15-30 mg total) by mouth at bedtime. 180 capsule 0   No current facility-administered medications on file prior to visit.   Review of Systems / Constitutional: Negative for unusual diaphoresis or night sweats HENT: Negative for ringing in ear or discharge Eyes: Negative for double vision or worsening visual disturbance.  Respiratory: Negative for choking and stridor.   Gastrointestinal: Negative for vomiting or other signifcant bowel  change Genitourinary: Negative for hematuria or change in urine volume.  Musculoskeletal: Negative for other MSK pain or swelling Skin: Negative for color change and worsening wound.  Neurological: Negative for tremors and numbness other than noted  Psychiatric/Behavioral: Negative for decreased concentration or agitation other than above       Objective:   Physical Exam BP 134/76 mmHg  Pulse 72  Temp(Src) 98.7 F (37.1 C) (Oral)  Ht 5' (1.524 m)  Wt 199 lb (90.266 kg)  BMI 38.86 kg/m2  SpO2 97% Constitutional: Negative for increased diaphoresis, other activity, appetite or siginficant weight change other than noted HENT: Negative for worsening hearing loss, ear pain, facial swelling, mouth sores and neck stiffness.   Eyes: Negative for other worsening pain, redness or visual disturbance.  Respiratory: Negative for shortness of breath and wheezing  Cardiovascular: Negative for chest pain and palpitations.  Gastrointestinal: Negative for diarrhea, blood in stool, abdominal distention or other pain Genitourinary: Negative for hematuria, flank pain or change in urine volume.  Musculoskeletal: Negative for myalgias or other joint complaints.  Skin: Negative for color change and wound or drainage.  Neurological: Negative for syncope and numbness. other than noted Hematological: Negative for adenopathy. or other swelling Psychiatric/Behavioral: Negative for hallucinations, SI, self-injury, decreased concentration or other worsening agitation.         Assessment & Plan:

## 2014-08-18 NOTE — Patient Instructions (Addendum)
Please consider making appt with Dr Nevada Crane or Dr Allyson Sabal for dermatology  Please make an appt with Dr Jana Hakim for cancer follow up  Please continue all other medications as before, and refills have been done if requeste - the temazepam  Please have the pharmacy call with any other refills you may need.  Please continue your efforts at being more active, low cholesterol diet, and weight control.  You are otherwise up to date with prevention measures today.  Please keep your appointments with your specialists as you may have planned  Please return in 6 months, or sooner if needed

## 2014-08-18 NOTE — Assessment & Plan Note (Signed)
stable overall by history and exam, recent data reviewed with pt, and pt to continue medical treatment as before,  to f/u any worsening symptoms or concerns BP Readings from Last 3 Encounters:  08/18/14 134/76  08/18/14 136/81  06/21/14 126/58

## 2014-08-18 NOTE — Patient Instructions (Signed)
OMEGA 3 FATTY ACIDS, GLUCOSAMINE WITH CHONDROITIN, GARLIC, CHERRIES, ARE ALL HELPFUL FOR ARTHRITIS

## 2014-08-18 NOTE — Addendum Note (Signed)
Addended by: Geryl Rankins D on: 08/18/2014 04:13 PM   Modules accepted: Orders

## 2014-08-19 ENCOUNTER — Other Ambulatory Visit: Payer: Self-pay | Admitting: Physical Medicine & Rehabilitation

## 2014-08-20 LAB — PMP ALCOHOL METABOLITE (ETG): ETGU: NEGATIVE ng/mL

## 2014-08-24 LAB — PRESCRIPTION MONITORING PROFILE (SOLSTAS)
AMPHETAMINE/METH: NEGATIVE ng/mL
BENZODIAZEPINE SCREEN, URINE: NEGATIVE ng/mL
Barbiturate Screen, Urine: NEGATIVE ng/mL
Buprenorphine, Urine: NEGATIVE ng/mL
COCAINE METABOLITES: NEGATIVE ng/mL
CREATININE, URINE: 228.92 mg/dL (ref >20.0)
Cannabinoid Scrn, Ur: NEGATIVE ng/mL
Carisoprodol, Urine: NEGATIVE ng/mL
Fentanyl, Ur: NEGATIVE ng/mL
MDMA URINE: NEGATIVE ng/mL
Meperidine, Ur: NEGATIVE ng/mL
Methadone Screen, Urine: NEGATIVE ng/mL
Nitrites, Initial: NEGATIVE ug/mL
PH URINE, INITIAL: 5.4 pH (ref 4.5–8.9)
PROPOXYPHENE: NEGATIVE ng/mL
TRAMADOL UR: NEGATIVE ng/mL
Tapentadol, urine: NEGATIVE ng/mL
Zolpidem, Urine: NEGATIVE ng/mL

## 2014-08-24 LAB — OPIATES/OPIOIDS (LC/MS-MS)
CODEINE URINE: NEGATIVE ng/mL (ref <50)
Hydrocodone: NEGATIVE ng/mL (ref <50)
Hydromorphone: NEGATIVE ng/mL (ref <50)
MORPHINE: NEGATIVE ng/mL (ref <50)
Norhydrocodone, Ur: NEGATIVE ng/mL (ref <50)
Noroxycodone, Ur: 6376 ng/mL (ref <50)
OXYMORPHONE, URINE: 2688 ng/mL (ref <50)
Oxycodone, ur: 4315 ng/mL (ref <50)

## 2014-08-24 LAB — OXYCODONE, URINE (LC/MS-MS)
NOROXYCODONE, UR: 6376 ng/mL (ref <50)
OXYCODONE, UR: 4315 ng/mL (ref <50)
OXYMORPHONE, URINE: 2688 ng/mL (ref <50)

## 2014-08-27 NOTE — Progress Notes (Signed)
Urine drug screen for this encounter is consistent for prescribed medication 

## 2014-10-20 ENCOUNTER — Encounter: Payer: Self-pay | Admitting: Physical Medicine & Rehabilitation

## 2014-10-20 ENCOUNTER — Encounter: Payer: Medicare Other | Attending: Physical Medicine & Rehabilitation | Admitting: Physical Medicine & Rehabilitation

## 2014-10-20 VITALS — BP 142/68 | HR 86

## 2014-10-20 DIAGNOSIS — M1711 Unilateral primary osteoarthritis, right knee: Secondary | ICD-10-CM | POA: Diagnosis not present

## 2014-10-20 DIAGNOSIS — M47816 Spondylosis without myelopathy or radiculopathy, lumbar region: Secondary | ICD-10-CM

## 2014-10-20 DIAGNOSIS — M1611 Unilateral primary osteoarthritis, right hip: Secondary | ICD-10-CM

## 2014-10-20 DIAGNOSIS — Z5181 Encounter for therapeutic drug level monitoring: Secondary | ICD-10-CM | POA: Diagnosis present

## 2014-10-20 DIAGNOSIS — Z79899 Other long term (current) drug therapy: Secondary | ICD-10-CM | POA: Diagnosis present

## 2014-10-20 DIAGNOSIS — G894 Chronic pain syndrome: Secondary | ICD-10-CM | POA: Diagnosis not present

## 2014-10-20 MED ORDER — OXYCODONE-ACETAMINOPHEN 7.5-325 MG PO TABS: 1.0000 | ORAL_TABLET | Freq: Three times a day (TID) | ORAL | Status: DC | PRN

## 2014-10-20 NOTE — Progress Notes (Signed)
Subjective:    Patient ID: Rose Sullivan, female    DOB: Nov 20, 1933, 81 y.o.   MRN: [id number]  HPI   Rose Sullivan is here in follow up of her chronic right hip and knee pain. She is taking her medications as prescribed. She doesn't feel that it works as well as it once did. She still is not really doing her exercises. Daughter does state that she went to church and did some singing which everyone enjoyed.   Sleep has been fair. Her health otherwise has been stable.   She takes percocet 7.5/325 for pain, one q6 prn. She is also using topical creams with some success as well.   Pain Inventory Average Pain 6 Pain Right Now 5 My pain is intermittent  In the last 24 hours, has pain interfered with the following? General activity 8 Relation with others 8 Enjoyment of life 8 What TIME of day is your pain at its worst? evening and night Sleep (in general) Poor  Pain is worse with: walking, standing and some activites Pain improves with: rest and medication Relief from Meds: 5  Mobility walk with assistance how many minutes can you walk? 1-2 ability to climb steps?  yes do you drive?  no use a wheelchair needs help with transfers  Function disabled: date disabled . I need assistance with the following:  feeding, dressing, bathing, toileting, meal prep, household duties and shopping  Neuro/Psych bladder control problems tingling trouble walking dizziness  Prior Studies Any changes since last visit?  no  Physicians involved in your care Any changes since last visit?  no   Family History  Problem Relation Age of Onset  . Hypertension Brother   . Lung cancer Brother   . Colon cancer Father   . COPD Brother   . Stroke Mother    Social History   Social History  . Marital Status: Widowed    Spouse Name: N/A  . Number of Children: 1  . Years of Education: N/A   Occupational History  .     Social History Main Topics  . Smoking status: Former Research scientist (life sciences)  .  Smokeless tobacco: Never Used     Comment: quit 40 years ago  . Alcohol Use: No  . Drug Use: No  . Sexual Activity: Not Asked   Other Topics Concern  . None   Social History Narrative   Lives with daughter   Past Surgical History  Procedure Laterality Date  . Dilation and curettage of uterus    . S/p mastectomy    . Appendectomy     Past Medical History  Diagnosis Date  . GLUCOSE INTOLERANCE 02/11/2007  . HYPERLIPIDEMIA 02/11/2007  . BLINDNESS, [place], [place] DEFINITION 11/25/2006  . Other specified forms of hearing loss 09/05/2009  . HYPERTENSION 02/11/2007  . Atrial fibrillation 03/07/2009  . GERD 02/11/2007  . LOW BACK PAIN, CHRONIC 11/25/2006  . BACK PAIN 12/10/2007  . Pain in Soft Tissues of Limb 02/11/2007  . OSTEOPENIA 02/11/2007  . FATIGUE 12/10/2007  . GAIT DISTURBANCE 12/10/2007  . DYSPNEA 03/18/2009  . Cough 03/07/2009  . CHEST PAIN 12/10/2007  . Abdominal pain, left lower quadrant 04/01/2007  . BREAST CANCER, HX OF 11/25/2006  . Nocturia 05/18/2010  . Hypersomnia 05/18/2010  . Impaired glucose tolerance 05/18/2010  . Allergic rhinitis, cause unspecified 07/23/2012  . Anxiety state, unspecified 02/04/2013   BP 142/68 mmHg  Pulse 86  SpO2 97%  Opioid Risk Score:   Fall Risk Score:  `  1  Depression screen PHQ 2/9  Depression screen [facility] 2/9 10/20/2014 08/18/2014 04/13/2014 08/05/2013  Decreased Interest 1 0 2 0  Down, Depressed, Hopeless 0 0 0 1  PHQ - 2 Score 1 0 2 1  Altered sleeping - - 2 -  Tired, decreased energy - - 3 -  Change in appetite - - 0 -  Feeling bad or failure about yourself  - - 0 -  Trouble concentrating - - 0 -  Moving slowly or fidgety/restless - - 0 -  Suicidal thoughts - - 0 -  PHQ-9 Score - - 7 -     Review of Systems  Musculoskeletal: Positive for gait problem.  Neurological: Positive for dizziness.       Tingling  All other systems reviewed and are negative.      Objective:   Physical Exam  General: Alert and oriented x 3, No apparent  distress. Morbidly obese.  HEENT: Head is normocephalic, atraumatic, PERRLA, EOMI, sclera anicteric, oral mucosa pink and moist, dentition intact, ext ear canals clear,  Neck: Supple without JVD or lymphadenopathy  Heart: Reg rate and rhythm. No murmurs rubs or gallops  Chest: CTA bilaterally without wheezes, rales, or rhonchi; no distress  Abdomen: Soft, non-tender, non-distended, bowel sounds positive.  Extremities: No clubbing, cyanosis, trace lower extremity edema. Pulses are 2+  Skin: Clean and intact without signs of breakdown.  Neuro: Pt able to track simple objects, primarily using her right eye. Gaze dysconjugate. Sensation was decreased to PP and LT in both hands to the wrists and both feet up to the knees in a stocking glove distribution. dtr's are 1+. Motor function is grossly 5/5.  M/S: crepitus in right more than left knee. Right hip tender with ROM---tender with palpation at the lateral hip along trochanter Psych: Pt's affect is calm and pleasant. She was in good spirits. More alert and initiating today.  Assessment & Plan:   1. Osteoarthritis of bilateral knees, right greater than left.  2. Osteoarthritis right hip  3. Facet arthropathy of lumbar and cervical spine--recent MRI of cervical spine reveals substantial DJD.  4. Peripheral polyneuropathy, quite possibly secondary to past chemotherapy  5. Morbid obesity  6. Legally blind  7. HTN   Plan:  1. Percocet 7.5/325 one q6 prn #120 were written today as well as an rx for August.  2. Continue gabapentin 100mg  BID to TID.  3. Topical gel for knee pain (per sports medicine?) We discussed many otc supps and diet options as well.  4. Discussed HEP. Also reviewed the importance of holistic management of her pain and drew a diagram to demonstrate. 5. Follow up with my NP in about 2 months. 15 minutes of face to face patient care time were spent during this visit. All questions were encouraged and answered.

## 2014-12-20 ENCOUNTER — Encounter: Payer: Self-pay | Admitting: Physical Medicine & Rehabilitation

## 2014-12-20 ENCOUNTER — Encounter: Payer: Medicare Other | Attending: Physical Medicine & Rehabilitation | Admitting: Physical Medicine & Rehabilitation

## 2014-12-20 VITALS — BP 148/59 | HR 86

## 2014-12-20 DIAGNOSIS — Z5181 Encounter for therapeutic drug level monitoring: Secondary | ICD-10-CM | POA: Insufficient documentation

## 2014-12-20 DIAGNOSIS — M1611 Unilateral primary osteoarthritis, right hip: Secondary | ICD-10-CM | POA: Diagnosis not present

## 2014-12-20 DIAGNOSIS — M1711 Unilateral primary osteoarthritis, right knee: Secondary | ICD-10-CM

## 2014-12-20 DIAGNOSIS — M47816 Spondylosis without myelopathy or radiculopathy, lumbar region: Secondary | ICD-10-CM | POA: Diagnosis not present

## 2014-12-20 DIAGNOSIS — Z79899 Other long term (current) drug therapy: Secondary | ICD-10-CM | POA: Insufficient documentation

## 2014-12-20 DIAGNOSIS — R7302 Impaired glucose tolerance (oral): Secondary | ICD-10-CM

## 2014-12-20 DIAGNOSIS — M1712 Unilateral primary osteoarthritis, left knee: Secondary | ICD-10-CM | POA: Diagnosis not present

## 2014-12-20 DIAGNOSIS — G894 Chronic pain syndrome: Secondary | ICD-10-CM | POA: Insufficient documentation

## 2014-12-20 DIAGNOSIS — R5381 Other malaise: Secondary | ICD-10-CM

## 2014-12-20 MED ORDER — OXYCODONE-ACETAMINOPHEN 7.5-325 MG PO TABS
1.0000 | ORAL_TABLET | Freq: Three times a day (TID) | ORAL | Status: DC | PRN
Start: 2014-12-20 — End: 2015-02-25

## 2014-12-20 MED ORDER — OXYCODONE-ACETAMINOPHEN 7.5-325 MG PO TABS: 1.0000 | ORAL_TABLET | Freq: Three times a day (TID) | ORAL | Status: DC | PRN

## 2014-12-20 MED ORDER — ONETOUCH ULTRASOFT LANCETS MISC: Status: DC

## 2014-12-20 MED ORDER — GLUCOSE BLOOD VI STRP: ORAL_STRIP | Status: DC

## 2014-12-20 NOTE — Progress Notes (Signed)
Subjective:    Patient ID: Rose Sullivan, female    DOB: 08-22-1933, 81 y.o.   MRN: [id number]  HPI   Rose Sullivan is here regarding her chronic pain. Not much has changed since I last saw her. Daughter feels that she is increasingly immobile and doesn't have much motivation to do anything different. She finds her pain medications fairly effective. Daughter denies any new health issues.   Pain Inventory Average Pain 5 Pain Right Now 5 My pain is stabbing and aching  In the last 24 hours, has pain interfered with the following? General activity 7 Relation with others 7 Enjoyment of life 7 What TIME of day is your pain at its worst? evening Sleep (in general) Fair  Pain is worse with: some activites Pain improves with: medication Relief from Meds: 5  Mobility walk with assistance how many minutes can you walk? 0 ability to climb steps?  yes do you drive?  no use a wheelchair needs help with transfers Do you have any goals in this area?  yes  Function retired  Neuro/Psych trouble walking  Prior Studies Any changes since last visit?  no  Physicians involved in your care Any changes since last visit?  no   Family History  Problem Relation Age of Onset  . Hypertension Brother   . Lung cancer Brother   . Colon cancer Father   . COPD Brother   . Stroke Mother    Social History   Social History  . Marital Status: Widowed    Spouse Name: N/A  . Number of Children: 1  . Years of Education: N/A   Occupational History  .     Social History Main Topics  . Smoking status: Former Research scientist (life sciences)  . Smokeless tobacco: Never Used     Comment: quit 40 years ago  . Alcohol Use: No  . Drug Use: No  . Sexual Activity: Not Asked   Other Topics Concern  . None   Social History Narrative   Lives with daughter   Past Surgical History  Procedure Laterality Date  . Dilation and curettage of uterus    . S/p mastectomy    . Appendectomy     Past Medical History    Diagnosis Date  . GLUCOSE INTOLERANCE 02/11/2007  . HYPERLIPIDEMIA 02/11/2007  . BLINDNESS, [place], [place] DEFINITION 11/25/2006  . Other specified forms of hearing loss 09/05/2009  . HYPERTENSION 02/11/2007  . Atrial fibrillation ([place]) 03/07/2009  . GERD 02/11/2007  . LOW BACK PAIN, CHRONIC 11/25/2006  . BACK PAIN 12/10/2007  . Pain in Soft Tissues of Limb 02/11/2007  . OSTEOPENIA 02/11/2007  . FATIGUE 12/10/2007  . GAIT DISTURBANCE 12/10/2007  . DYSPNEA 03/18/2009  . Cough 03/07/2009  . CHEST PAIN 12/10/2007  . Abdominal pain, left lower quadrant 04/01/2007  . BREAST CANCER, HX OF 11/25/2006  . Nocturia 05/18/2010  . Hypersomnia 05/18/2010  . Impaired glucose tolerance 05/18/2010  . Allergic rhinitis, cause unspecified 07/23/2012  . Anxiety state, unspecified 02/04/2013   BP 148/59 mmHg  Pulse 86  SpO2 98%  Opioid Risk Score:   Fall Risk Score:  `1  Depression screen PHQ 2/9  Depression screen [facility] 2/9 10/20/2014 08/18/2014 04/13/2014 08/05/2013  Decreased Interest 1 0 2 0  Down, Depressed, Hopeless 0 0 0 1  PHQ - 2 Score 1 0 2 1  Altered sleeping - - 2 -  Tired, decreased energy - - 3 -  Change in appetite - - 0 -  Feeling bad or failure about yourself  - - 0 -  Trouble concentrating - - 0 -  Moving slowly or fidgety/restless - - 0 -  Suicidal thoughts - - 0 -  PHQ-9 Score - - 7 -       Review of Systems  Musculoskeletal: Positive for myalgias, arthralgias and gait problem.  All other systems reviewed and are negative.      Objective:   Physical Exam  General: Alert and oriented x 3, No apparent distress. Morbidly obese.  HEENT: Head is normocephalic, atraumatic, PERRLA, EOMI, sclera anicteric, oral mucosa pink and moist, dentition intact, ext ear canals clear,  Neck: Supple without JVD or lymphadenopathy  Heart: Reg rate and rhythm. No murmurs rubs or gallops  Chest: CTA bilaterally without wheezes, rales, or rhonchi; no distress  Abdomen: Soft, non-tender, non-distended,  bowel sounds positive.  Extremities: No clubbing, cyanosis, trace lower extremity edema. Pulses are 2+  Skin: Clean and intact without signs of breakdown.  Neuro: Pt able to track simple objects, primarily using her right eye. Gaze dysconjugate. Sensation was decreased to PP and LT in both hands to the wrists and both feet up to the knees in a stocking glove distribution. dtr's are 1+. Motor function is grossly 5/5.  M/S: crepitus in right more than left knee. Right hip tender with ROM---tender with palpation at the lateral hip along trochanter once again Psych: Pt's affect is calm and pleasant. She was in good spirits. Fairly alert  Assessment & Plan:   1. Osteoarthritis of bilateral knees, right greater than left.  2. Osteoarthritis right hip  3. Facet arthropathy of lumbar and cervical spine--recent MRI of cervical spine reveals substantial DJD.  4. Peripheral polyneuropathy, quite possibly secondary to past chemotherapy  5. Morbid obesity  6. Legally blind  7. HTN    Plan:  1. Percocet 7.5/325 one q6 prn #120 were written today as well as an rx for December.  2. Continue gabapentin 100mg  BID to TID.  3. Topical gel for knee pain  4. Will make a referral to [facility] for PT, OT , Aide to assist at home. May need longer term assistance given her prognosis, lack of motivation.  5. Follow up with my NP in about 2 months. 15 minutes of face to face patient care time were spent during this visit. All questions were encouraged and answered.

## 2014-12-20 NOTE — Patient Instructions (Signed)
PLEASE CALL ME WITH ANY PROBLEMS OR QUESTIONS (#[phone]). HAVE A HAPPY HOLIDAY SEASON!!!    

## 2014-12-30 ENCOUNTER — Telehealth: Payer: Self-pay

## 2014-12-30 NOTE — Telephone Encounter (Signed)
Wayne Both called to have verbal orders approved for therapy twice a week for 8 weeks. Left message to approve verbal orders.

## 2015-01-10 DIAGNOSIS — M170 Bilateral primary osteoarthritis of knee: Secondary | ICD-10-CM

## 2015-01-10 DIAGNOSIS — M5386 Other specified dorsopathies, lumbar region: Secondary | ICD-10-CM

## 2015-01-10 DIAGNOSIS — M1611 Unilateral primary osteoarthritis, right hip: Secondary | ICD-10-CM | POA: Diagnosis not present

## 2015-01-10 DIAGNOSIS — M8580 Other specified disorders of bone density and structure, unspecified site: Secondary | ICD-10-CM | POA: Diagnosis not present

## 2015-01-14 ENCOUNTER — Telehealth: Payer: Self-pay

## 2015-01-14 NOTE — Telephone Encounter (Signed)
Physical therapist called stating that the pt was very fatigued during therapy. HR at rest was 89. HR after walking was 130. The PT also stated that she C/O shortness of breath. PT thought it may be appropriate to send a nurse out to evaluate her. Please advise.

## 2015-01-14 NOTE — Telephone Encounter (Signed)
Contacted [facility] to send RN visit.  No name of therapist so I notified [place] office Freda Munro) that it is ok to send out RN for assessment.

## 2015-01-14 NOTE — Telephone Encounter (Signed)
i agree with a [facility] RN assessment

## 2015-02-16 ENCOUNTER — Telehealth: Payer: Self-pay | Admitting: *Deleted

## 2015-02-16 NOTE — Telephone Encounter (Signed)
Tillie Rung PT requesting extension of PT orders 2wk4.  Verbal approval given.

## 2015-02-17 ENCOUNTER — Telehealth: Payer: Self-pay

## 2015-02-17 NOTE — Telephone Encounter (Signed)
Cheral Marker at [facility] requesting verbal orders for next week 2x for 1 week and then extend therapy after that 2x for 5 weeks. Also wants to continue bath aid the same way. Verbal orders approved.

## 2015-02-18 ENCOUNTER — Encounter: Payer: Medicare Other | Admitting: Registered Nurse

## 2015-02-18 ENCOUNTER — Ambulatory Visit: Payer: Medicare Other | Admitting: Internal Medicine

## 2015-02-18 ENCOUNTER — Ambulatory Visit: Payer: Medicare Other | Admitting: Registered Nurse

## 2015-02-22 ENCOUNTER — Telehealth: Payer: Self-pay

## 2015-02-22 ENCOUNTER — Other Ambulatory Visit: Payer: Self-pay | Admitting: Internal Medicine

## 2015-02-22 NOTE — Telephone Encounter (Signed)
Medication printed signed and faxed 02/22/2015

## 2015-02-22 NOTE — Telephone Encounter (Signed)
Done hardcopy to [employer]

## 2015-02-25 ENCOUNTER — Encounter: Payer: Medicare Other | Attending: Physical Medicine & Rehabilitation | Admitting: Registered Nurse

## 2015-02-25 ENCOUNTER — Encounter: Payer: Self-pay | Admitting: Internal Medicine

## 2015-02-25 ENCOUNTER — Ambulatory Visit (INDEPENDENT_AMBULATORY_CARE_PROVIDER_SITE_OTHER): Payer: Medicare Other | Admitting: Internal Medicine

## 2015-02-25 ENCOUNTER — Encounter: Payer: Self-pay | Admitting: Registered Nurse

## 2015-02-25 VITALS — BP 152/69 | HR 70 | Resp 14

## 2015-02-25 VITALS — BP 136/80 | HR 87 | Temp 98.4°F | Resp 20 | Wt 200.0 lb

## 2015-02-25 DIAGNOSIS — G4700 Insomnia, unspecified: Secondary | ICD-10-CM

## 2015-02-25 DIAGNOSIS — M1711 Unilateral primary osteoarthritis, right knee: Secondary | ICD-10-CM

## 2015-02-25 DIAGNOSIS — Z5181 Encounter for therapeutic drug level monitoring: Secondary | ICD-10-CM | POA: Diagnosis present

## 2015-02-25 DIAGNOSIS — M1712 Unilateral primary osteoarthritis, left knee: Secondary | ICD-10-CM

## 2015-02-25 DIAGNOSIS — R7302 Impaired glucose tolerance (oral): Secondary | ICD-10-CM

## 2015-02-25 DIAGNOSIS — M47816 Spondylosis without myelopathy or radiculopathy, lumbar region: Secondary | ICD-10-CM | POA: Diagnosis not present

## 2015-02-25 DIAGNOSIS — E785 Hyperlipidemia, unspecified: Secondary | ICD-10-CM

## 2015-02-25 DIAGNOSIS — G894 Chronic pain syndrome: Secondary | ICD-10-CM | POA: Diagnosis present

## 2015-02-25 DIAGNOSIS — I10 Essential (primary) hypertension: Secondary | ICD-10-CM | POA: Diagnosis not present

## 2015-02-25 DIAGNOSIS — Z79899 Other long term (current) drug therapy: Secondary | ICD-10-CM

## 2015-02-25 DIAGNOSIS — H9193 Unspecified hearing loss, bilateral: Secondary | ICD-10-CM

## 2015-02-25 DIAGNOSIS — M1611 Unilateral primary osteoarthritis, right hip: Secondary | ICD-10-CM

## 2015-02-25 MED ORDER — OXYCODONE-ACETAMINOPHEN 7.5-325 MG PO TABS: 1.0000 | ORAL_TABLET | Freq: Three times a day (TID) | ORAL | Status: DC | PRN

## 2015-02-25 NOTE — Assessment & Plan Note (Signed)
Ok for temazepam refill,  to f/u any worsening symptoms or concerns 

## 2015-02-25 NOTE — Progress Notes (Signed)
Subjective:    Patient ID: Rose Sullivan, female    DOB: 1933-09-11, 82 y.o.   MRN: [id number]  HPI: Rose Sullivan is a 82 year old female who returns for follow up for chronic pain and medication refill. She says her pain is located in her right hip, lower back and right leg. She rates her pain 7. Her current exercise regime is attending physical and occupational therapy twice a week and  performing chair and stretching exercises. She's wheelchair and chair bound. Arrived in wheelchair. Daughter in room all questions answered  Pain Inventory Average Pain 7 Pain Right Now 7 My pain is constant and aching  In the last 24 hours, has pain interfered with the following? General activity 8 Relation with others 7 Enjoyment of life 7 What TIME of day is your pain at its worst? daytime Sleep (in general) Fair  Pain is worse with: unsure Pain improves with: medication Relief from Meds: 5  Mobility walk with assistance how many minutes can you walk? 0 ability to climb steps?  yes do you drive?  no use a wheelchair  Function retired I need assistance with the following:  dressing, bathing, toileting, meal prep and household duties Do you have any goals in this area?  yes  Neuro/Psych No problems in this area  Prior Studies Any changes since last visit?  no  Physicians involved in your care Any changes since last visit?  no   Family History  Problem Relation Age of Onset  . Hypertension Brother   . Lung cancer Brother   . Colon cancer Father   . COPD Brother   . Stroke Mother    Social History   Social History  . Marital Status: Widowed    Spouse Name: N/A  . Number of Children: 1  . Years of Education: N/A   Occupational History  .     Social History Main Topics  . Smoking status: Former Research scientist (life sciences)  . Smokeless tobacco: Never Used     Comment: quit 40 years ago  . Alcohol Use: No  . Drug Use: No  . Sexual Activity: Not Asked   Other Topics  Concern  . None   Social History Narrative   Lives with daughter   Past Surgical History  Procedure Laterality Date  . Dilation and curettage of uterus    . S/p mastectomy    . Appendectomy     Past Medical History  Diagnosis Date  . GLUCOSE INTOLERANCE 02/11/2007  . HYPERLIPIDEMIA 02/11/2007  . BLINDNESS, [place], [place] DEFINITION 11/25/2006  . Other specified forms of hearing loss 09/05/2009  . HYPERTENSION 02/11/2007  . Atrial fibrillation ([place]) 03/07/2009  . GERD 02/11/2007  . LOW BACK PAIN, CHRONIC 11/25/2006  . BACK PAIN 12/10/2007  . Pain in Soft Tissues of Limb 02/11/2007  . OSTEOPENIA 02/11/2007  . FATIGUE 12/10/2007  . GAIT DISTURBANCE 12/10/2007  . DYSPNEA 03/18/2009  . Cough 03/07/2009  . CHEST PAIN 12/10/2007  . Abdominal pain, left lower quadrant 04/01/2007  . BREAST CANCER, HX OF 11/25/2006  . Nocturia 05/18/2010  . Hypersomnia 05/18/2010  . Impaired glucose tolerance 05/18/2010  . Allergic rhinitis, cause unspecified 07/23/2012  . Anxiety state, unspecified 02/04/2013   BP 152/69 mmHg  Pulse 70  Resp 14  SpO2 96%  Opioid Risk Score:   Fall Risk Score:  `1  Depression screen PHQ 2/9  Depression screen [facility] 2/9 02/25/2015 10/20/2014 08/18/2014 04/13/2014 08/05/2013  Decreased Interest 0 1  0 2 0  Down, Depressed, Hopeless 0 0 0 0 1  PHQ - 2 Score 0 1 0 2 1  Altered sleeping - - - 2 -  Tired, decreased energy - - - 3 -  Change in appetite - - - 0 -  Feeling bad or failure about yourself  - - - 0 -  Trouble concentrating - - - 0 -  Moving slowly or fidgety/restless - - - 0 -  Suicidal thoughts - - - 0 -  PHQ-9 Score - - - 7 -     Review of Systems  All other systems reviewed and are negative.      Objective:   Physical Exam  Constitutional: She is oriented to person, place, and time. She appears well-developed and well-nourished.  HENT:  Head: Normocephalic and atraumatic.  Neck: Normal range of motion. Neck supple.  Cardiovascular: Normal rate and regular rhythm.    Pulmonary/Chest: Effort normal and breath sounds normal.  Musculoskeletal:  Normal Muscle Bulk and Muscle Testing Reveals: Upper Extremities: Full ROM and Muscle Strength 5/5 Lumbar Paraspinal Tenderness: L-3- L-5 Lower Extremities: Right: Decreased ROM and Muscle Strength 4/5 Right Lower Extremity Flexion Produces Pain into Hip Left: Lower Extremity Full ROM and Muscle Strength 5/5 Arrived in Wheelchair    Neurological: She is alert and oriented to person, place, and time.  Skin: Skin is warm and dry.  Psychiatric: She has a normal mood and affect.  Nursing note and vitals reviewed.         Assessment & Plan:  1. Osteoarthritis of bilateral knees, right greater than left.  Refilled: Oxycodone-acetaminophen 7.5/325mg  one tablet every 8 hours as needed. May take an extra tablet when pain is sever. No more than 4 a day #120. Second script given for the following month. 2. Osteoarthritis right hip: Continue Voltaren gel four times a day. Continue heat and exercise  3. Facet arthropathy of lumbar and cervical spine : Continue current medication regime. Continue to monitor 4. Peripheral polyneuropathy, quite possibly secondary to past chemotherapy: Continue Gabapentin 5. Morbid obesity : Encouraged to increase activity, and start chair exercises. 6. Legally blind: Continue to monitor. Daughter with patient  20 minutes of face to face patient care time was spent during this visit. All questions were encouraged and answered.  F/U in 1 month

## 2015-02-25 NOTE — Assessment & Plan Note (Signed)
stable overall by history and exam, recent data reviewed with pt, and pt to continue medical treatment as before,  to f/u any worsening symptoms or concerns Lab Results  Component Value Date   HGBA1C 5.8 08/05/2013    

## 2015-02-25 NOTE — Patient Instructions (Addendum)
Your Ears were irrigated of wax today  Please continue all other medications as before, and refills have been done if requested - the temazepam  Please have the pharmacy call with any other refills you may need.  Please continue your efforts at being more active, low cholesterol diet, and weight control.  You are otherwise up to date with prevention measures today.  Please keep your appointments with your specialists as you may have planned  Please go to the LAB in the Basement (turn left off the elevator) for the tests to be done today  You will be contacted by phone if any changes need to be made immediately.  Otherwise, you will receive a letter about your results with an explanation, but please check with MyChart first.  Please remember to sign up for MyChart if you have not done so, as this will be important to you in the future with finding out test results, communicating by private email, and scheduling acute appointments online when needed.  Please return in 6 months, or sooner if needed

## 2015-02-25 NOTE — Progress Notes (Signed)
Subjective:    Patient ID: Rose Sullivan, female    DOB: [birth year], 82 y.o.   MRN: [id number]  HPI  Here for wellness and f/u with daughter;  Overall doing ok;  Pt denies Chest pain, worsening SOB, DOE, wheezing, orthopnea, PND, worsening LE edema, palpitations, dizziness or syncope.  Pt denies neurological change such as new headache, facial or extremity weakness.  Pt denies polydipsia, polyuria, or low sugar symptoms. Pt states overall good compliance with treatment and medications, good tolerability, and has been trying to follow appropriate diet.  Pt denies worsening depressive symptoms, suicidal ideation or panic. No fever, night sweats, wt loss, loss of appetite, or other constitutional symptoms.  Pt states good ability with ADL's, has low fall risk, home safety reviewed and adequate, no other significant changes in hearing, remains blind. Has pain management appt at 330pm.   Declines tetanus Has bilat hearing loss - ? Wax impactions again. No pain, discharge, dizziness or tinnitus.  Past Medical History  Diagnosis Date  . GLUCOSE INTOLERANCE 02/11/2007  . HYPERLIPIDEMIA 02/11/2007  . BLINDNESS, [place], [place] DEFINITION 11/25/2006  . Other specified forms of hearing loss 09/05/2009  . HYPERTENSION 02/11/2007  . Atrial fibrillation ([place]) 03/07/2009  . GERD 02/11/2007  . LOW BACK PAIN, CHRONIC 11/25/2006  . BACK PAIN 12/10/2007  . Pain in Soft Tissues of Limb 02/11/2007  . OSTEOPENIA 02/11/2007  . FATIGUE 12/10/2007  . GAIT DISTURBANCE 12/10/2007  . DYSPNEA 03/18/2009  . Cough 03/07/2009  . CHEST PAIN 12/10/2007  . Abdominal pain, left lower quadrant 04/01/2007  . BREAST CANCER, HX OF 11/25/2006  . Nocturia 05/18/2010  . Hypersomnia 05/18/2010  . Impaired glucose tolerance 05/18/2010  . Allergic rhinitis, cause unspecified 07/23/2012  . Anxiety state, unspecified 02/04/2013   Past Surgical History  Procedure Laterality Date  . Dilation and curettage of uterus    . S/p mastectomy    .  Appendectomy      reports that she has quit smoking. She has never used smokeless tobacco. She reports that she does not drink alcohol or use illicit drugs. family history includes COPD in her brother; Colon cancer in her father; Hypertension in her brother; Lung cancer in her brother; Stroke in her mother. Allergies  Allergen Reactions  . Trazodone And Nefazodone Other (See Comments)    hallucinations   Current Outpatient Prescriptions on File Prior to Visit  Medication Sig Dispense Refill  . diclofenac sodium (VOLTAREN) 1 % GEL Apply 1 application topically 4 (four) times daily. Apply to both knees and hands 3 Tube 4  . diltiazem (CARDIZEM CD) 240 MG 24 hr capsule TAKE ONE CAPSULE BY MOUTH EVERY DAY 30 capsule 6  . exemestane (AROMASIN) 25 MG tablet Take 1 tablet (25 mg total) by mouth daily. 90 tablet 3  . gabapentin (NEURONTIN) 100 MG capsule Take 100 mg by mouth 3 (three) times daily.    Marland Kitchen glucose blood (ONE TOUCH ULTRA TEST) test strip Use as instructed once daily to check blood sugar.  ICD 10 code R73.02 100 each 11  . Lancets (ONETOUCH ULTRASOFT) lancets Use as instructed once daily to check blood sugar.  ICD 10 code R73.02 100 each 11  . losartan (COZAAR) 50 MG tablet Take 50 mg by mouth daily.    . temazepam (RESTORIL) 30 MG capsule TAKE ONE CAPSULE BY MOUTH AT BEDTIME AS NEEDED FOR SLEEP 90 capsule 1  . oxyCODONE-acetaminophen (PERCOCET) 7.5-325 MG tablet Take 1 tablet by mouth every 8 (eight) hours as  needed. 120 tablet 0   No current facility-administered medications on file prior to visit.    Review of Systems Constitutional: Negative for increased diaphoresis, other activity, appetite or siginficant weight change other than noted HENT: Negative for worsening hearing loss, ear pain, facial swelling, mouth sores and neck stiffness.   Eyes: Negative for other worsening pain, redness or visual disturbance.  Respiratory: Negative for shortness of breath and wheezing    Cardiovascular: Negative for chest pain and palpitations.  Gastrointestinal: Negative for diarrhea, blood in stool, abdominal distention or other pain Genitourinary: Negative for hematuria, flank pain or change in urine volume.  Musculoskeletal: Negative for myalgias or other joint complaints.  Skin: Negative for color change and wound or drainage.  Neurological: Negative for syncope and numbness. other than noted Hematological: Negative for adenopathy. or other swelling Psychiatric/Behavioral: Negative for hallucinations, SI, self-injury, decreased concentration or other worsening agitation.      Objective:   Physical Exam BP 136/80 mmHg  Pulse 87  Temp(Src) 98.4 F (36.9 C) (Oral)  Resp 20  Wt 200 lb (90.719 kg)  SpO2 93% VS noted, obese, nontender Constitutional: Pt is oriented to person, place, and time. Appears well-developed and well-nourished, in no significant distress Head: Normocephalic and atraumatic.  Right Ear: External ear normal.  Left Ear: External ear normal.  Bilat wax impactions irrigated and removed Nose: Nose normal.  Mouth/Throat: Oropharynx is clear and moist.  Eyes: Conjunctivae and EOM are normal. Pupils are equal, round, and reactive to light.  Neck: Normal range of motion. Neck supple. No JVD present. No tracheal deviation present or significant neck LA or mass Cardiovascular: Normal rate, regular rhythm, normal heart sounds and intact distal pulses.   Pulmonary/Chest: Effort normal and breath sounds without rales or wheezing  Abdominal: Soft. Bowel sounds are normal. NT. No HSM  Musculoskeletal: Normal range of motion. Exhibits no edema.  Lymphadenopathy:  Has no cervical adenopathy.  Neurological: Pt is alert and oriented to person. No cranial nerve deficit except blindness Motor grossly intact Skin: Skin is warm and dry. No rash noted.  Psychiatric:  Has nervous mood and affect. Behavior is normal.     Assessment & Plan:

## 2015-02-25 NOTE — Progress Notes (Signed)
Pre visit review using our clinic review tool, if applicable. No additional management support is needed unless otherwise documented below in the visit note. 

## 2015-02-25 NOTE — Assessment & Plan Note (Signed)
stable overall by history and exam, recent data reviewed with pt, and pt to continue medical treatment as before,  to f/u any worsening symptoms or concerns Lab Results  Component Value Date   LDLCALC 107* 08/05/2013    

## 2015-02-25 NOTE — Assessment & Plan Note (Addendum)
Improved hearing post irrigation,  to f/u any worsening symptoms or concerns  Note:  Total time for pt hx, exam, review of record with pt in the room, determination of diagnoses and plan for further eval and tx is > 40 min, with over 50% spent in coordination and counseling of patient

## 2015-02-25 NOTE — Assessment & Plan Note (Signed)
Mild elevated today, BP somewhat labile, declines med change today, o/w stable overall by history and exam, recent data reviewed with pt, and pt to continue medical treatment as before,  to f/u any worsening symptoms or concerns BP Readings from Last 3 Encounters:  02/25/15 152/69  02/25/15 136/80  12/20/14 148/59

## 2015-03-03 LAB — TOXASSURE SELECT,+ANTIDEPR,UR: PDF: 0

## 2015-03-07 NOTE — Progress Notes (Signed)
Urine drug screen for this encounter is consistent for prescribed medication 

## 2015-03-13 ENCOUNTER — Emergency Department (HOSPITAL_COMMUNITY): Payer: Medicare Other

## 2015-03-13 ENCOUNTER — Observation Stay (HOSPITAL_COMMUNITY): Payer: Medicare Other

## 2015-03-13 ENCOUNTER — Inpatient Hospital Stay (HOSPITAL_COMMUNITY)
Admission: EM | Admit: 2015-03-13 | Discharge: 2015-03-16 | DRG: 064 | Disposition: A | Discharge status: Rehabilitation Facility | Payer: Medicare Other | Attending: Internal Medicine | Admitting: Internal Medicine

## 2015-03-13 ENCOUNTER — Encounter (HOSPITAL_COMMUNITY): Payer: Self-pay | Admitting: Emergency Medicine

## 2015-03-13 DIAGNOSIS — I110 Hypertensive heart disease with heart failure: Secondary | ICD-10-CM | POA: Diagnosis present

## 2015-03-13 DIAGNOSIS — E669 Obesity, unspecified: Secondary | ICD-10-CM | POA: Diagnosis present

## 2015-03-13 DIAGNOSIS — G4700 Insomnia, unspecified: Secondary | ICD-10-CM | POA: Diagnosis present

## 2015-03-13 DIAGNOSIS — G8929 Other chronic pain: Secondary | ICD-10-CM | POA: Diagnosis present

## 2015-03-13 DIAGNOSIS — C7800 Secondary malignant neoplasm of unspecified lung: Secondary | ICD-10-CM | POA: Diagnosis present

## 2015-03-13 DIAGNOSIS — Z9010 Acquired absence of unspecified breast and nipple: Secondary | ICD-10-CM

## 2015-03-13 DIAGNOSIS — R4182 Altered mental status, unspecified: Secondary | ICD-10-CM | POA: Diagnosis not present

## 2015-03-13 DIAGNOSIS — Z8249 Family history of ischemic heart disease and other diseases of the circulatory system: Secondary | ICD-10-CM

## 2015-03-13 DIAGNOSIS — I6340 Cerebral infarction due to embolism of unspecified cerebral artery: Secondary | ICD-10-CM | POA: Insufficient documentation

## 2015-03-13 DIAGNOSIS — I480 Paroxysmal atrial fibrillation: Secondary | ICD-10-CM | POA: Diagnosis not present

## 2015-03-13 DIAGNOSIS — C50919 Malignant neoplasm of unspecified site of unspecified female breast: Secondary | ICD-10-CM | POA: Diagnosis present

## 2015-03-13 DIAGNOSIS — M8580 Other specified disorders of bone density and structure, unspecified site: Secondary | ICD-10-CM | POA: Diagnosis present

## 2015-03-13 DIAGNOSIS — Z87891 Personal history of nicotine dependence: Secondary | ICD-10-CM

## 2015-03-13 DIAGNOSIS — Z6837 Body mass index (BMI) 37.0-37.9, adult: Secondary | ICD-10-CM

## 2015-03-13 DIAGNOSIS — Z9181 History of falling: Secondary | ICD-10-CM

## 2015-03-13 DIAGNOSIS — I10 Essential (primary) hypertension: Secondary | ICD-10-CM | POA: Diagnosis not present

## 2015-03-13 DIAGNOSIS — I5033 Acute on chronic diastolic (congestive) heart failure: Secondary | ICD-10-CM | POA: Diagnosis present

## 2015-03-13 DIAGNOSIS — G9340 Encephalopathy, unspecified: Secondary | ICD-10-CM | POA: Diagnosis not present

## 2015-03-13 DIAGNOSIS — G459 Transient cerebral ischemic attack, unspecified: Secondary | ICD-10-CM

## 2015-03-13 DIAGNOSIS — I63432 Cerebral infarction due to embolism of left posterior cerebral artery: Principal | ICD-10-CM | POA: Diagnosis present

## 2015-03-13 DIAGNOSIS — C50911 Malignant neoplasm of unspecified site of right female breast: Secondary | ICD-10-CM

## 2015-03-13 DIAGNOSIS — Z79811 Long term (current) use of aromatase inhibitors: Secondary | ICD-10-CM

## 2015-03-13 DIAGNOSIS — R29708 NIHSS score 8: Secondary | ICD-10-CM | POA: Diagnosis present

## 2015-03-13 DIAGNOSIS — E785 Hyperlipidemia, unspecified: Secondary | ICD-10-CM | POA: Diagnosis present

## 2015-03-13 DIAGNOSIS — H548 Legal blindness, as defined in USA: Secondary | ICD-10-CM | POA: Diagnosis present

## 2015-03-13 DIAGNOSIS — G8191 Hemiplegia, unspecified affecting right dominant side: Secondary | ICD-10-CM | POA: Diagnosis present

## 2015-03-13 DIAGNOSIS — K219 Gastro-esophageal reflux disease without esophagitis: Secondary | ICD-10-CM | POA: Diagnosis present

## 2015-03-13 DIAGNOSIS — Z888 Allergy status to other drugs, medicaments and biological substances status: Secondary | ICD-10-CM

## 2015-03-13 DIAGNOSIS — K5900 Constipation, unspecified: Secondary | ICD-10-CM | POA: Diagnosis present

## 2015-03-13 DIAGNOSIS — Z7901 Long term (current) use of anticoagulants: Secondary | ICD-10-CM

## 2015-03-13 DIAGNOSIS — F411 Generalized anxiety disorder: Secondary | ICD-10-CM | POA: Diagnosis present

## 2015-03-13 DIAGNOSIS — I739 Peripheral vascular disease, unspecified: Secondary | ICD-10-CM | POA: Diagnosis present

## 2015-03-13 DIAGNOSIS — Z823 Family history of stroke: Secondary | ICD-10-CM

## 2015-03-13 DIAGNOSIS — N189 Chronic kidney disease, unspecified: Secondary | ICD-10-CM | POA: Insufficient documentation

## 2015-03-13 DIAGNOSIS — M545 Low back pain, unspecified: Secondary | ICD-10-CM | POA: Diagnosis present

## 2015-03-13 DIAGNOSIS — H9190 Unspecified hearing loss, unspecified ear: Secondary | ICD-10-CM | POA: Diagnosis present

## 2015-03-13 DIAGNOSIS — E119 Type 2 diabetes mellitus without complications: Secondary | ICD-10-CM | POA: Diagnosis present

## 2015-03-13 DIAGNOSIS — I639 Cerebral infarction, unspecified: Secondary | ICD-10-CM | POA: Diagnosis present

## 2015-03-13 DIAGNOSIS — R413 Other amnesia: Secondary | ICD-10-CM | POA: Diagnosis present

## 2015-03-13 DIAGNOSIS — I4891 Unspecified atrial fibrillation: Secondary | ICD-10-CM | POA: Diagnosis present

## 2015-03-13 LAB — APTT: aPTT: 26 seconds (ref 24–37)

## 2015-03-13 LAB — I-STAT TROPONIN, ED: TROPONIN I, POC: 0 ng/mL (ref 0.00–0.08)

## 2015-03-13 LAB — COMPREHENSIVE METABOLIC PANEL
ALT: 24 U/L (ref 14–54)
ANION GAP: 16 — AB (ref 5–15)
AST: 46 U/L — ABNORMAL HIGH (ref 15–41)
Albumin: 3.5 g/dL (ref 3.5–5.0)
Alkaline Phosphatase: 69 U/L (ref 38–126)
BUN: 19 mg/dL (ref 6–20)
CHLORIDE: 106 mmol/L (ref 101–111)
CO2: 21 mmol/L — AB (ref 22–32)
Calcium: 9 mg/dL (ref 8.9–10.3)
Creatinine, Ser: 1.25 mg/dL — ABNORMAL HIGH (ref 0.44–1.00)
GFR calc non Af Amer: 40 mL/min — ABNORMAL LOW (ref >60)
GFR, EST AFRICAN AMERICAN: 46 mL/min — AB (ref >60)
Glucose, Bld: 127 mg/dL — ABNORMAL HIGH (ref 65–99)
POTASSIUM: 4.2 mmol/L (ref 3.5–5.1)
SODIUM: 143 mmol/L (ref 135–145)
Total Bilirubin: 1.8 mg/dL — ABNORMAL HIGH (ref 0.3–1.2)
Total Protein: 7.1 g/dL (ref 6.5–8.1)

## 2015-03-13 LAB — PROTIME-INR
INR: 1.16 (ref 0.00–1.49)
Prothrombin Time: 15 seconds (ref 11.6–15.2)

## 2015-03-13 LAB — URINALYSIS, ROUTINE W REFLEX MICROSCOPIC
Glucose, UA: NEGATIVE mg/dL
HGB URINE DIPSTICK: NEGATIVE
Ketones, ur: NEGATIVE mg/dL
Leukocytes, UA: NEGATIVE
NITRITE: NEGATIVE
PH: 6 (ref 5.0–8.0)
Protein, ur: NEGATIVE mg/dL
SPECIFIC GRAVITY, URINE: 1.026 (ref 1.005–1.030)

## 2015-03-13 LAB — RAPID URINE DRUG SCREEN, HOSP PERFORMED
AMPHETAMINES: NOT DETECTED
BARBITURATES: NOT DETECTED
BENZODIAZEPINES: POSITIVE — AB
Cocaine: NOT DETECTED
Opiates: POSITIVE — AB
TETRAHYDROCANNABINOL: NOT DETECTED

## 2015-03-13 LAB — CBC WITH DIFFERENTIAL/PLATELET
Basophils Absolute: 0 10*3/uL (ref 0.0–0.1)
Basophils Relative: 0 %
Eosinophils Absolute: 0.5 10*3/uL (ref 0.0–0.7)
Eosinophils Relative: 5 %
HEMATOCRIT: 45.4 % (ref 36.0–46.0)
HEMOGLOBIN: 15.1 g/dL — AB (ref 12.0–15.0)
LYMPHS ABS: 2 10*3/uL (ref 0.7–4.0)
Lymphocytes Relative: 22 %
MCH: 31.1 pg (ref 26.0–34.0)
MCHC: 33.3 g/dL (ref 30.0–36.0)
MCV: 93.6 fL (ref 78.0–100.0)
MONOS PCT: 4 %
Monocytes Absolute: 0.4 10*3/uL (ref 0.1–1.0)
NEUTROS ABS: 6.3 10*3/uL (ref 1.7–7.7)
NEUTROS PCT: 69 %
Platelets: 259 10*3/uL (ref 150–400)
RBC: 4.85 MIL/uL (ref 3.87–5.11)
RDW: 12.6 % (ref 11.5–15.5)
WBC: 9.2 10*3/uL (ref 4.0–10.5)

## 2015-03-13 LAB — GLUCOSE, CAPILLARY: GLUCOSE-CAPILLARY: 152 mg/dL — AB (ref 65–99)

## 2015-03-13 LAB — TROPONIN I: Troponin I: <0.03 ng/mL (ref <0.031)

## 2015-03-13 LAB — CK: Total CK: 159 U/L (ref 38–234)

## 2015-03-13 MED ORDER — SODIUM CHLORIDE 0.9 % IV BOLUS (SEPSIS)
500.0000 mL | Freq: Once | INTRAVENOUS | Status: AC
  Administered 2015-03-13: 500 mL via INTRAVENOUS

## 2015-03-13 MED ORDER — DILTIAZEM HCL ER COATED BEADS 240 MG PO CP24
240.0000 mg | ORAL_CAPSULE | Freq: Every day | ORAL | Status: DC
  Administered 2015-03-14 – 2015-03-16 (×3): 240 mg via ORAL
  Filled 2015-03-13 (×3): qty 1

## 2015-03-13 MED ORDER — STROKE: EARLY STAGES OF RECOVERY BOOK
Freq: Once | Status: DC
  Filled 2015-03-13: qty 1

## 2015-03-13 MED ORDER — CLOPIDOGREL BISULFATE 75 MG PO TABS
75.0000 mg | ORAL_TABLET | Freq: Every day | ORAL | Status: DC
  Administered 2015-03-13 – 2015-03-14 (×2): 75 mg via ORAL
  Filled 2015-03-13 (×2): qty 1

## 2015-03-13 MED ORDER — SENNOSIDES-DOCUSATE SODIUM 8.6-50 MG PO TABS
1.0000 | ORAL_TABLET | Freq: Every evening | ORAL | Status: DC | PRN
Start: 2015-03-13 — End: 2015-03-16
  Administered 2015-03-13: 1 via ORAL
  Filled 2015-03-13: qty 1

## 2015-03-13 MED ORDER — ENOXAPARIN SODIUM 40 MG/0.4ML ~~LOC~~ SOLN
40.0000 mg | SUBCUTANEOUS | Status: DC
  Administered 2015-03-13 – 2015-03-14 (×2): 40 mg via SUBCUTANEOUS
  Filled 2015-03-13 (×2): qty 0.4

## 2015-03-13 MED ORDER — ENOXAPARIN SODIUM 40 MG/0.4ML ~~LOC~~ SOLN: 40.0000 mg | SUBCUTANEOUS | Status: DC

## 2015-03-13 MED ORDER — INSULIN ASPART 100 UNIT/ML ~~LOC~~ SOLN
0.0000 [IU] | Freq: Three times a day (TID) | SUBCUTANEOUS | Status: DC
  Administered 2015-03-14 – 2015-03-16 (×4): 1 [IU] via SUBCUTANEOUS

## 2015-03-13 MED ORDER — EXEMESTANE 25 MG PO TABS
25.0000 mg | ORAL_TABLET | Freq: Every day | ORAL | Status: DC
  Administered 2015-03-14 – 2015-03-16 (×3): 25 mg via ORAL
  Filled 2015-03-13 (×3): qty 1

## 2015-03-13 MED ORDER — TEMAZEPAM 7.5 MG PO CAPS: 7.5000 mg | ORAL_CAPSULE | Freq: Every evening | ORAL | Status: DC | PRN

## 2015-03-13 MED ORDER — OXYCODONE-ACETAMINOPHEN 7.5-325 MG PO TABS
1.0000 | ORAL_TABLET | Freq: Three times a day (TID) | ORAL | Status: DC | PRN
  Administered 2015-03-13: 1 via ORAL
  Filled 2015-03-13: qty 1

## 2015-03-13 MED ORDER — ASPIRIN 81 MG PO CHEW
81.0000 mg | CHEWABLE_TABLET | Freq: Every day | ORAL | Status: DC
  Administered 2015-03-13: 81 mg via ORAL
  Filled 2015-03-13: qty 1

## 2015-03-13 MED ORDER — DICLOFENAC SODIUM 1 % TD GEL
2.0000 g | Freq: Four times a day (QID) | TRANSDERMAL | Status: DC
  Administered 2015-03-13 – 2015-03-14 (×5): 2 g via TOPICAL
  Filled 2015-03-13 (×2): qty 100

## 2015-03-13 NOTE — H&P (Addendum)
History and Physical  KALIMA SCHRADE [id number] DOB: 1933/11/24 DOA: 03/13/2015  Referring physician: Dr. Ralene Bathe PCP: [name], MD  Consultants: none Patient coming from: home   Chief Complaint: AMS  HPI: RUTILA MCCONAUGHEY is a 82 y.o. female has a past medical history significant for hypertension, hyperlipidemia, diabetes mellitus, chronic pain, is being brought by her daughter from home with a chief complaint of altered mental status and weakness for the past couple of days. Patient is taking Restoril every evening to sleep, she took a dose 2 days ago, and yesterday when she woke up the daughter felt like she was weaker than normal and more confused. Last night her daughter held her Restoril out of concern not to make her even more confused. At baseline, patient is alert and oriented 3, sometimes forgets to date but no other issues as far as she knows. Daughter denies any reported issues prior to yesterday, no known fevers, no cough or chest congestion or respiratory symptoms, no abdominal pain, nausea or vomiting or diarrhea, no dysuria. She reported mild chest discomfort yesterday but none since. She got concerned today when felt like patient was not returning to normal and called EMS. On EMS evaluation was felt like she was weak on one side, however she thinks it was the left side, but she is not sure.  In the ED, patient is afebrile, hypertensive, she has slightly elevated creatinine which is at baseline, her troponin was 0. Her chest x-ray showed cardiomegaly with mild edema, small bilateral pleural effusion. She underwent a CT scan of the brain without any acute intracranial processes. TRH is asked for admission for altered mental status.  Review of Systems: Unable to obtain ROS due to confusion.  Past Medical History  Diagnosis Date  . GLUCOSE INTOLERANCE 02/11/2007  . HYPERLIPIDEMIA 02/11/2007  . BLINDNESS, [place], [place] DEFINITION 11/25/2006  . Other specified forms of hearing  loss 09/05/2009  . HYPERTENSION 02/11/2007  . Atrial fibrillation ([place]) 03/07/2009  . GERD 02/11/2007  . LOW BACK PAIN, CHRONIC 11/25/2006  . BACK PAIN 12/10/2007  . Pain in Soft Tissues of Limb 02/11/2007  . OSTEOPENIA 02/11/2007  . FATIGUE 12/10/2007  . GAIT DISTURBANCE 12/10/2007  . DYSPNEA 03/18/2009  . Cough 03/07/2009  . CHEST PAIN 12/10/2007  . Abdominal pain, left lower quadrant 04/01/2007  . BREAST CANCER, HX OF 11/25/2006  . Nocturia 05/18/2010  . Hypersomnia 05/18/2010  . Impaired glucose tolerance 05/18/2010  . Allergic rhinitis, cause unspecified 07/23/2012  . Anxiety state, unspecified 02/04/2013   Past Surgical History  Procedure Laterality Date  . Dilation and curettage of uterus    . S/p mastectomy    . Appendectomy     Social History:  reports that she has quit smoking. She has never used smokeless tobacco. She reports that she does not drink alcohol or use illicit drugs.  Allergies  Allergen Reactions  . Trazodone And Nefazodone Other (See Comments)    hallucinations    Family History  Problem Relation Age of Onset  . Hypertension Brother   . Lung cancer Brother   . Colon cancer Father   . COPD Brother   . Stroke Mother     Prior to Admission medications   Medication Sig Start Date End Date Taking? Authorizing Provider  diclofenac sodium (VOLTAREN) 1 % GEL Apply 1 application topically 4 (four) times daily. Apply to both knees and hands 03/09/13   Meredith Staggers, MD  diltiazem (CARDIZEM CD) 240 MG 24 hr  capsule TAKE ONE CAPSULE BY MOUTH EVERY DAY 07/08/14   Biagio Borg, MD  exemestane (AROMASIN) 25 MG tablet Take 1 tablet (25 mg total) by mouth daily. 08/19/12   Amy Milda Smart, PA-C  gabapentin (NEURONTIN) 100 MG capsule Take 100 mg by mouth 3 (three) times daily.    Historical Provider, MD  glucose blood (ONE TOUCH ULTRA TEST) test strip Use as instructed once daily to check blood sugar.  ICD 10 code R73.02 12/20/14   Meredith Staggers, MD  Lancets [facility] ULTRASOFT)  lancets Use as instructed once daily to check blood sugar.  ICD 10 code R73.02 12/20/14   Meredith Staggers, MD  losartan (COZAAR) 50 MG tablet Take 50 mg by mouth daily.    Historical Provider, MD  oxyCODONE-acetaminophen (PERCOCET) 7.5-325 MG tablet Take 1 tablet by mouth every 8 (eight) hours as needed. 02/25/15   Bayard Hugger, NP  temazepam (RESTORIL) 30 MG capsule TAKE ONE CAPSULE BY MOUTH AT BEDTIME AS NEEDED FOR SLEEP 02/22/15   Biagio Borg, MD   Physical Exam: Filed Vitals:   03/13/15 1430 03/13/15 1445 03/13/15 1500 03/13/15 1515  BP: 162/87 174/86 174/85 180/90  Pulse: 71 76 75 77  Temp:      TempSrc:      Resp: 14 13  23   SpO2: 97% 96% 97% 94%     GENERAL: NAD  HEENT: head NCAT, no scleral icterus. Pupils round and reactive. Mucous membranes are moist. Posterior pharynx clear of any exudate or lesions.  NECK: Supple. No carotid bruits. No lymphadenopathy or thyromegaly.  LUNGS: Clear to auscultation. No wheezing or crackles  HEART: Irregular rhythm. 2+ pulses, no JVD, no peripheral edema  ABDOMEN: Soft, nontender, and nondistended. Positive bowel sounds.  EXTREMITIES: Without any cyanosis, clubbing, rash, lesions or edema.  NEUROLOGIC: Alert and oriented to place and person, but not to time. Cranial nerves II through XII are grossly intact. Strength 5/5 in all 4.   Labs on Admission:  Basic Metabolic Panel:  Recent Labs Lab 03/13/15 1340  NA 143  K 4.2  CL 106  CO2 21*  GLUCOSE 127*  BUN 19  CREATININE 1.25*  CALCIUM 9.0   Liver Function Tests:  Recent Labs Lab 03/13/15 1340  AST 46*  ALT 24  ALKPHOS 69  BILITOT 1.8*  PROT 7.1  ALBUMIN 3.5   CBC:  Recent Labs Lab 03/13/15 1340  WBC 9.2  NEUTROABS 6.3  HGB 15.1*  HCT 45.4  MCV 93.6  PLT 259   Cardiac Enzymes:  Recent Labs Lab 03/13/15 1340  CKTOTAL 159   Radiological Exams on Admission: Dg Chest 2 View  03/13/2015  CLINICAL DATA:  82 year old presenting with acute mental  status changes and somnolence. Right upper extremity weakness. Current history of hypertension. Personal history of breast cancer. EXAM: CHEST  2 VIEW COMPARISON:  11/19/2013 and earlier. FINDINGS: Cardiac silhouette mildly to moderately enlarged. Thoracic aorta tortuous and atherosclerotic, unchanged. Hilar and mediastinal contours otherwise unremarkable. Mild diffuse interstitial pulmonary edema. Small bilateral pleural effusions. The no confluent airspace consolidation. Degenerative changes and DISH involving the thoracic spine. IMPRESSION: Mild CHF, with mild to moderate cardiomegaly, mild diffuse interstitial pulmonary edema and small bilateral pleural effusions. Electronically Signed   By: Evangeline Dakin M.D.   On: 03/13/2015 14:22   Ct Head Wo Contrast  03/13/2015  CLINICAL DATA:  Altered mental status. EXAM: CT HEAD WITHOUT CONTRAST TECHNIQUE: Contiguous axial images were obtained from the base of the skull through  the vertex without intravenous contrast. COMPARISON:  12/18/2012 FINDINGS: Similar findings of advanced atrophy with sulcal prominence centralized volume loss with commensurate ex vacuo dilatation of the ventricular system. Extensive nearly confluent periventricular hypodensities are unchanged compatible microvascular ischemic disease. Old lacunar infarcts within the left side of the pons (image 9, series 3) as well as the bilateral insular cortices (image 16). Given extensive background parenchymal abnormalities, there is no CT evidence of superimposed acute large territory infarct. No intraparenchymal or extra-axial mass or hemorrhage. Unchanged size and configuration of the ventricles and basilar cisterns. No midline shift. Intracranial atherosclerosis. Limited visualization of the paranasal sinuses and mastoid air cells is normal. No air-fluid levels. Debris is noted within the bilateral external auditory canals. Regional soft tissues appear normal. No displaced calvarial fracture.  IMPRESSION: Similar findings of advanced atrophy and microvascular ischemic disease without acute intracranial process. Electronically Signed   By: Sandi Mariscal M.D.   On: 03/13/2015 14:36    EKG: Independently reviewed. Sinus rhythm with occasional ectopic atrial beats  Assessment/Plan Active Problems:   BLINDNESS, [place], [place] DEFINITION   Essential hypertension   Atrial fibrillation (HCC)   LOW BACK PAIN, CHRONIC   Breast cancer metastasized to lung (HCC)   Acute encephalopathy   DM (diabetes mellitus) ([place])   Acute encephalopathy - Unclear etiology at this point, may be related to her medications however unlikely since her daughter is managing all of her home medications. - There is no infectious etiology apparent, chest x-ray is without infiltrates and urinalysis is clear - Given reported unilateral weakness, we'll rule out stroke with an MRI of the brain, will admit patient on TIA pathway, we'll not order carotid ultrasound nor MRA unless the MRI comes back positive for CVA - start Aspirin  A. Fib, probable paroxysmal - EKG appears to be sinus with ectopic atrial beats, will monitor on telemetry - She was in 1 point on Coumadin in the past however this was discontinued due to high fall risk and deemed not a candidate for anticoagulation - CHADS2DS-VASC score 5  Chest pain - Reported yesterday, we'll cycle troponins  Acute on Chronic diastolic heart failure - Most recent 2-D echo was in 2011 showed grade on diastolic dysfunction with an EF of 60-65%. Given cardiomegaly and pulmonary edema on chest x-ray, we'll repeat a 2-D echo  Hypertension - We'll hold Cozaar to allow permissive hypertension, we'll resume diltiazem given number 2  Chronic back pain  - Resume home medications  - Hold gabapentin  Insomnia - We'll decrease the Restoril dose  History of metastasized breast cancer - apparently in remission  DM - most recent A1C shows good control, highest A1C 7.1 in  2011, controlled since. - SSI   Diet: NPO Fluids: none DVT Prophylaxis: Lovenox  Code Status: Partial. No intubation.  Family Communication: d/w daughter bedside  Disposition Plan: admit to telemetry, observation    Calise Dunckel M. Cruzita Lederer, MD [facility] Pager [phone]  If 7PM-7AM, please contact night-coverage [email or web address] Password [facility] 03/13/2015, 4:06 PM

## 2015-03-13 NOTE — ED Notes (Signed)
Pt assisted on and off bedpan-- urine strong odor-- concentrated.

## 2015-03-13 NOTE — Plan of Care (Addendum)
MRI results are back, positive for CVA. Discussed with patient's daughter over the phone. Consulted neurology, discussed with Dr. Silverio Decamp who will see patient in consultation. Will order carotid doppler, further workup per neurology.  Rose Sullivan M. Cruzita Lederer, MD [facility] [phone]

## 2015-03-13 NOTE — ED Notes (Signed)
Pt to ED from home with family c/o that "pt is not self," pt slept all day yesterday per family, was found on the floor this am by a family member, then supposedly got self back into chair-- was in chair when ems arrived. Pt is alert on arrival-- oriented to place, self, not time.

## 2015-03-13 NOTE — Consult Note (Signed)
Requesting Physician: Dr. Cruzita Lederer     Reason for consultation: Acute stroke  HPI:                                                                                                                                         Rose Sullivan is an 82 y.o. female patient who was brought into the emergency room due to change in mental status , increased lethargy , memory problems noted by her daughter about 2 days ago. At baseline patient has poor functional status , typically stays in bed most of the time . She is noted to be legally blind, history of atrial fibrillation and was on Coumadin until about one year ago but was discontinued due to frequent falls . She is currently not on any anticoagulant or antiplatelet therapy . She occasionally takes BC powders . History obtained from her daughter. Patient unable to provide any history . Other than the change in mental status, her daughter is not aware of any other new neurological symptoms.   Date last known well:  > 2 days ago Time last known well:  unknown tPA Given: No: outside time window  Stroke Risk Factors - atrial fibrillation, hypertension and not on any anticoagulation or antiplatelet therapy  Past Medical History: Past Medical History  Diagnosis Date  . GLUCOSE INTOLERANCE 02/11/2007  . HYPERLIPIDEMIA 02/11/2007  . BLINDNESS, [place], [place] DEFINITION 11/25/2006  . Other specified forms of hearing loss 09/05/2009  . HYPERTENSION 02/11/2007  . Atrial fibrillation ([place]) 03/07/2009  . GERD 02/11/2007  . LOW BACK PAIN, CHRONIC 11/25/2006  . BACK PAIN 12/10/2007  . Pain in Soft Tissues of Limb 02/11/2007  . OSTEOPENIA 02/11/2007  . FATIGUE 12/10/2007  . GAIT DISTURBANCE 12/10/2007  . DYSPNEA 03/18/2009  . Cough 03/07/2009  . CHEST PAIN 12/10/2007  . Abdominal pain, left lower quadrant 04/01/2007  . BREAST CANCER, HX OF 11/25/2006  . Nocturia 05/18/2010  . Hypersomnia 05/18/2010  . Impaired glucose tolerance 05/18/2010  . Allergic rhinitis, cause  unspecified 07/23/2012  . Anxiety state, unspecified 02/04/2013    Past Surgical History  Procedure Laterality Date  . Dilation and curettage of uterus    . S/p mastectomy    . Appendectomy      Family History: Family History  Problem Relation Age of Onset  . Hypertension Brother   . Lung cancer Brother   . Colon cancer Father   . COPD Brother   . Stroke Mother     Social History:   reports that she has quit smoking. She has never used smokeless tobacco. She reports that she does not drink alcohol or use illicit drugs.  Allergies:  Allergies  Allergen Reactions  . Trazodone And Nefazodone Other (See Comments)    hallucinations     Medications:  Current facility-administered medications:  .   stroke: mapping our early stages of recovery book, , Does not apply, Once, Costin M Gherghe, MD .  aspirin chewable tablet 81 mg, 81 mg, Oral, Daily, Caren Griffins, MD, 81 mg at 03/13/15 1814 .  diclofenac sodium (VOLTAREN) 1 % transdermal gel 2 g, 2 g, Topical, QID, Costin Karlyne Greenspan, MD .  Derrill Memo ON 03/14/2015] diltiazem (CARDIZEM CD) 24 hr capsule 240 mg, 240 mg, Oral, Daily, Costin Karlyne Greenspan, MD .  Derrill Memo ON 03/14/2015] exemestane (AROMASIN) tablet 25 mg, 25 mg, Oral, Daily, Costin Karlyne Greenspan, MD .  insulin aspart (novoLOG) injection 0-9 Units, 0-9 Units, Subcutaneous, TID WC, Costin Karlyne Greenspan, MD .  oxyCODONE-acetaminophen (PERCOCET) 7.5-325 MG per tablet 1 tablet, 1 tablet, Oral, Q8H PRN, Costin Karlyne Greenspan, MD .  senna-docusate (Senokot-S) tablet 1 tablet, 1 tablet, Oral, QHS PRN, Caren Griffins, MD, 1 tablet at 03/13/15 2009 .  temazepam (RESTORIL) capsule 7.5 mg, 7.5 mg, Oral, QHS PRN, Costin Karlyne Greenspan, MD    ROS:                                                                                                                                       History     unobtainable from patient due to age, mental status and lack of cooperation    Neurologic Examination:                                                                                                      Blood pressure 174/78, pulse 94, temperature 98.8 F (37.1 C), temperature source Oral, resp. rate 20, height 5' (1.524 m), weight 86.864 kg (191 lb 8 oz), SpO2 98 %.  Evaluation of higher integrative functions including: Level of alertness: Alert,  Oriented to place and person, not to month or year . She could identify her family members but could not remember the middle name of her daughter  Speech: fluent, no evidence of dysarthria or aphasia noted.  Test the following cranial nerves: 3-12 grossly intact,  noted to be legally blind at baseline she is able to count fingers at 1 feet ,  but reports that she could not see the faces clearly at 2 feet. She is able to identify hand movements in both visual fields to confrontation testing at about 1 feet.  Motor examination: Normal tone, bulk,  right upper extremity drift, 4/5 weakness in right deltoid,  distally symmetric strength compared to left upper extremity.  easily sustained antigravity strength in left lower extremity, difficulty elevating her right upper extremity antigravity, reports chronic severe right knee and hip pain.  Examination of sensation :  reduced sensation to pinprick in right upper and lower extremities and on right face Examination of deep tendon reflexes: 2+,symmetric in all extremities, normal plantars bilaterally Test coordination: Normal finger nose testing with left upper extremity,  has mild right upper extremity appendicular ataxia, no abnormal involuntary movements or tremors noted.  Gait: Deferred   Lab Results: Basic Metabolic Panel:  Recent Labs Lab 03/13/15 1340  NA 143  K 4.2  CL 106  CO2 21*  GLUCOSE 127*  BUN 19  CREATININE 1.25*  CALCIUM 9.0    Liver Function Tests:  Recent Labs Lab  03/13/15 1340  AST 46*  ALT 24  ALKPHOS 69  BILITOT 1.8*  PROT 7.1  ALBUMIN 3.5   No results for input(s): LIPASE, AMYLASE in the last 168 hours. No results for input(s): AMMONIA in the last 168 hours.  CBC:  Recent Labs Lab 03/13/15 1340  WBC 9.2  NEUTROABS 6.3  HGB 15.1*  HCT 45.4  MCV 93.6  PLT 259    Cardiac Enzymes:  Recent Labs Lab 03/13/15 1340 03/13/15 1849  CKTOTAL 159  --   TROPONINI  --  <0.03    Lipid Panel: No results for input(s): CHOL, TRIG, HDL, CHOLHDL, VLDL, LDLCALC in the last 168 hours.  CBG: No results for input(s): GLUCAP in the last 168 hours.  Microbiology: No results found for this or any previous visit.   Imaging: Dg Chest 2 View  03/13/2015  CLINICAL DATA:  82 year old presenting with acute mental status changes and somnolence. Right upper extremity weakness. Current history of hypertension. Personal history of breast cancer. EXAM: CHEST  2 VIEW COMPARISON:  11/19/2013 and earlier. FINDINGS: Cardiac silhouette mildly to moderately enlarged. Thoracic aorta tortuous and atherosclerotic, unchanged. Hilar and mediastinal contours otherwise unremarkable. Mild diffuse interstitial pulmonary edema. Small bilateral pleural effusions. The no confluent airspace consolidation. Degenerative changes and DISH involving the thoracic spine. IMPRESSION: Mild CHF, with mild to moderate cardiomegaly, mild diffuse interstitial pulmonary edema and small bilateral pleural effusions. Electronically Signed   By: Evangeline Dakin M.D.   On: 03/13/2015 14:22   Ct Head Wo Contrast  03/13/2015  CLINICAL DATA:  Altered mental status. EXAM: CT HEAD WITHOUT CONTRAST TECHNIQUE: Contiguous axial images were obtained from the base of the skull through the vertex without intravenous contrast. COMPARISON:  12/18/2012 FINDINGS: Similar findings of advanced atrophy with sulcal prominence centralized volume loss with commensurate ex vacuo dilatation of the ventricular system.  Extensive nearly confluent periventricular hypodensities are unchanged compatible microvascular ischemic disease. Old lacunar infarcts within the left side of the pons (image 9, series 3) as well as the bilateral insular cortices (image 16). Given extensive background parenchymal abnormalities, there is no CT evidence of superimposed acute large territory infarct. No intraparenchymal or extra-axial mass or hemorrhage. Unchanged size and configuration of the ventricles and basilar cisterns. No midline shift. Intracranial atherosclerosis. Limited visualization of the paranasal sinuses and mastoid air cells is normal. No air-fluid levels. Debris is noted within the bilateral external auditory canals. Regional soft tissues appear normal. No displaced calvarial fracture. IMPRESSION: Similar findings of advanced atrophy and microvascular ischemic disease without acute intracranial process. Electronically Signed   By: Sandi Mariscal M.D.   On: 03/13/2015 14:36   Mr Brain Wo Contrast  03/13/2015  CLINICAL DATA:  TIA. Altered mental status and weakness  for the past couple of days. EXAM: MRI HEAD WITHOUT CONTRAST TECHNIQUE: Multiplanar, multiecho pulse sequences of the brain and surrounding structures were obtained without intravenous contrast. COMPARISON:  Head CT 03/13/2015 FINDINGS: Some sequences are moderately motion degraded. There is abnormal diffusion-weighted signal involving the mesial left temporal lobe/hippocampus as well as lateral aspect of the left thalamus with evidence of mildly reduced ADC. There is at most minimal associated edema/ T2 hyperintensity. There is moderate cerebral atrophy. A few chronic cerebral microhemorrhages are noted. Confluent T2 hyperintensities in the cerebral white matter bilaterally are nonspecific but compatible with extensive chronic small vessel ischemic disease. Chronic lacunar infarcts are noted in the right thalamus and deep cerebral white matter bilaterally. No mass, midline  shift, or extra-axial fluid collection is seen. Prior bilateral cataract extraction is noted. Paranasal sinuses and mastoid air cells are clear. Major intracranial vascular flow voids are grossly preserved, although evaluation is limited by motion artifact and the left PCA is not well seen. IMPRESSION: 1. Small acute/early subacute infarcts in the left thalamus and mesial left temporal lobe. 2. Extensive chronic small vessel ischemic disease and cerebral atrophy. Electronically Signed   By: Logan Bores M.D.   On: 03/13/2015 18:09    Assessment and plan:   Rose Sullivan is an 82 y.o. female patient who presented to the ER with 2 days of mental status changes and memory problems. MRI of the brain showed an acute left thalamic and left hippocampal infarcts. These appear most likely secondary to a left PCA distal embolic infarcts. Patient has a history of atrial fibrillation, currently not on any anticoagulation or antiplatelet therapy. She was previously on Coumadin, stopped 1 year ago due to frequent falls. Recommend further stroke workup with CT angiogram of the head and neck, echocardiogram, patient currently on telemetry. Check an A1c and lipid profile. Recommend starting Plavix 75 mg daily.  Due to involvement of left hippocampal formation with reported mental status changes, recommend an EEG tomorrow to rule out any abnormal Discharges.  Patient was previously considered not a good candidate for anticoagulation for atrial fibrillation, and Coumadin was discontinued one year ago. Due to her increased risk for embolic infarcts, the need to restart anticoagulation should be addressed prior to discharge. Will defer that to the stroke team who will be following with the patient starting tomorrow morning.

## 2015-03-13 NOTE — ED Provider Notes (Signed)
CSN: [id number]     Arrival date & time 03/13/15  1302 History   First MD Initiated Contact with Patient 03/13/15 1320     Chief Complaint  Patient presents with  . Altered Mental Status  . Weakness     The history is provided by the EMS personnel. No language interpreter was used.   Rose Sullivan is a 82 y.o. female who presents to the Emergency Department complaining of AMS.  Level V caveat due to confusion.  Pt is brought in by EMS.  Hx is provided by EMS.  Per report she has had increased confusion and not acting like herself for the last few days. She was last seen normal 3 days ago. For the last 2 days she has been sleeping all day and not getting out of bed. This morning she was found on the floor. No reports of fevers, vomiting, shortness of breath.  Past Medical History  Diagnosis Date  . GLUCOSE INTOLERANCE 02/11/2007  . HYPERLIPIDEMIA 02/11/2007  . BLINDNESS, [place], [place] DEFINITION 11/25/2006  . Other specified forms of hearing loss 09/05/2009  . HYPERTENSION 02/11/2007  . Atrial fibrillation ([place]) 03/07/2009  . GERD 02/11/2007  . LOW BACK PAIN, CHRONIC 11/25/2006  . BACK PAIN 12/10/2007  . Pain in Soft Tissues of Limb 02/11/2007  . OSTEOPENIA 02/11/2007  . FATIGUE 12/10/2007  . GAIT DISTURBANCE 12/10/2007  . DYSPNEA 03/18/2009  . Cough 03/07/2009  . CHEST PAIN 12/10/2007  . Abdominal pain, left lower quadrant 04/01/2007  . BREAST CANCER, HX OF 11/25/2006  . Nocturia 05/18/2010  . Hypersomnia 05/18/2010  . Impaired glucose tolerance 05/18/2010  . Allergic rhinitis, cause unspecified 07/23/2012  . Anxiety state, unspecified 02/04/2013   Past Surgical History  Procedure Laterality Date  . Dilation and curettage of uterus    . S/p mastectomy    . Appendectomy     Family History  Problem Relation Age of Onset  . Hypertension Brother   . Lung cancer Brother   . Colon cancer Father   . COPD Brother   . Stroke Mother    Social History  Substance Use Topics  . Smoking status:  Former Research scientist (life sciences)  . Smokeless tobacco: Never Used     Comment: quit 40 years ago  . Alcohol Use: No   OB History    No data available     Review of Systems  Unable to perform ROS: Mental status change      Allergies  Trazodone and nefazodone  Home Medications   Prior to Admission medications   Medication Sig Start Date End Date Taking? Authorizing Provider  diclofenac sodium (VOLTAREN) 1 % GEL Apply 1 application topically 4 (four) times daily. Apply to both knees and hands 03/09/13   Meredith Staggers, MD  diltiazem [facility] CD) 240 MG 24 hr capsule TAKE ONE CAPSULE BY MOUTH EVERY DAY 07/08/14   Biagio Borg, MD  exemestane (AROMASIN) 25 MG tablet Take 1 tablet (25 mg total) by mouth daily. 08/19/12   Amy Milda Smart, PA-C  gabapentin (NEURONTIN) 100 MG capsule Take 100 mg by mouth 3 (three) times daily.    Historical Provider, MD  glucose blood (ONE TOUCH ULTRA TEST) test strip Use as instructed once daily to check blood sugar.  ICD 10 code R73.02 12/20/14   Meredith Staggers, MD  Lancets [facility] ULTRASOFT) lancets Use as instructed once daily to check blood sugar.  ICD 10 code R73.02 12/20/14   Meredith Staggers, MD  losartan (  COZAAR) 50 MG tablet Take 50 mg by mouth daily.    Historical Provider, MD  oxyCODONE-acetaminophen (PERCOCET) 7.5-325 MG tablet Take 1 tablet by mouth every 8 (eight) hours as needed. 02/25/15   Bayard Hugger, NP  temazepam (RESTORIL) 30 MG capsule TAKE ONE CAPSULE BY MOUTH AT BEDTIME AS NEEDED FOR SLEEP 02/22/15   Biagio Borg, MD   BP 180/90 mmHg  Pulse 77  Temp(Src) 97.3 F (36.3 C) (Rectal)  Resp 23  SpO2 94% Physical Exam  Constitutional: She appears well-developed.  Appears uncomfortable, disheveled.  HENT:  Head: Normocephalic and atraumatic.  Cardiovascular: Normal rate and regular rhythm.   No murmur heard. Pulmonary/Chest: Effort normal and breath sounds normal. No respiratory distress.  Abdominal: Soft. There is no tenderness. There is no  rebound and no guarding.  Musculoskeletal: She exhibits no edema or tenderness.  Neurological: She is alert.  Disoriented to time. Confused. Generalized weakness with increased weakness in the right upper extremity. Left eye deviates to the left.  Skin: Skin is warm and dry.  Psychiatric: She has a normal mood and affect.  Nursing note and vitals reviewed.   ED Course  Procedures (including critical care time) Labs Review Labs Reviewed  COMPREHENSIVE METABOLIC PANEL - Abnormal; Notable for the following:    CO2 21 (*)    Glucose, Bld 127 (*)    Creatinine, Ser 1.25 (*)    AST 46 (*)    Total Bilirubin 1.8 (*)    GFR calc non Af Amer 40 (*)    GFR calc Af Amer 46 (*)    Anion gap 16 (*)    All other components within normal limits  CBC WITH DIFFERENTIAL/PLATELET - Abnormal; Notable for the following:    Hemoglobin 15.1 (*)    All other components within normal limits  URINALYSIS, ROUTINE W REFLEX MICROSCOPIC (NOT AT [facility]) - Abnormal; Notable for the following:    Color, Urine AMBER (*)    Bilirubin Urine SMALL (*)    All other components within normal limits  URINE RAPID DRUG SCREEN, HOSP PERFORMED - Abnormal; Notable for the following:    Opiates POSITIVE (*)    Benzodiazepines POSITIVE (*)    All other components within normal limits  CK  TROPONIN I  TROPONIN I  HEMOGLOBIN A1C  LIPID PANEL  I-STAT TROPOININ, ED    Imaging Review Dg Chest 2 View  03/13/2015  CLINICAL DATA:  82 year old presenting with acute mental status changes and somnolence. Right upper extremity weakness. Current history of hypertension. Personal history of breast cancer. EXAM: CHEST  2 VIEW COMPARISON:  11/19/2013 and earlier. FINDINGS: Cardiac silhouette mildly to moderately enlarged. Thoracic aorta tortuous and atherosclerotic, unchanged. Hilar and mediastinal contours otherwise unremarkable. Mild diffuse interstitial pulmonary edema. Small bilateral pleural effusions. The no confluent airspace  consolidation. Degenerative changes and DISH involving the thoracic spine. IMPRESSION: Mild CHF, with mild to moderate cardiomegaly, mild diffuse interstitial pulmonary edema and small bilateral pleural effusions. Electronically Signed   By: Evangeline Dakin M.D.   On: 03/13/2015 14:22   Ct Head Wo Contrast  03/13/2015  CLINICAL DATA:  Altered mental status. EXAM: CT HEAD WITHOUT CONTRAST TECHNIQUE: Contiguous axial images were obtained from the base of the skull through the vertex without intravenous contrast. COMPARISON:  12/18/2012 FINDINGS: Similar findings of advanced atrophy with sulcal prominence centralized volume loss with commensurate ex vacuo dilatation of the ventricular system. Extensive nearly confluent periventricular hypodensities are unchanged compatible microvascular ischemic disease. Old lacunar infarcts  within the left side of the pons (image 9, series 3) as well as the bilateral insular cortices (image 16). Given extensive background parenchymal abnormalities, there is no CT evidence of superimposed acute large territory infarct. No intraparenchymal or extra-axial mass or hemorrhage. Unchanged size and configuration of the ventricles and basilar cisterns. No midline shift. Intracranial atherosclerosis. Limited visualization of the paranasal sinuses and mastoid air cells is normal. No air-fluid levels. Debris is noted within the bilateral external auditory canals. Regional soft tissues appear normal. No displaced calvarial fracture. IMPRESSION: Similar findings of advanced atrophy and microvascular ischemic disease without acute intracranial process. Electronically Signed   By: Sandi Mariscal M.D.   On: 03/13/2015 14:36   I have personally reviewed and evaluated these images and lab results as part of my medical decision-making.   EKG Interpretation None      MDM   Final diagnoses:  TIA (transient ischemic attack)    Patient here for evaluation of altered mental status. Patient  confused on examination, questionable increased right upper extremity weakness. Family states she is not currently at her neurologic baseline. Question of this is medication induced versus other process. Plan to admit for further evaluation.  Quintella Reichert, MD 03/13/15 1758

## 2015-03-14 ENCOUNTER — Observation Stay (HOSPITAL_COMMUNITY): Payer: Medicare Other

## 2015-03-14 ENCOUNTER — Inpatient Hospital Stay (HOSPITAL_COMMUNITY): Payer: Medicare Other

## 2015-03-14 ENCOUNTER — Observation Stay (HOSPITAL_BASED_OUTPATIENT_CLINIC_OR_DEPARTMENT_OTHER): Payer: Medicare Other

## 2015-03-14 ENCOUNTER — Encounter (HOSPITAL_COMMUNITY): Payer: Self-pay | Admitting: Radiology

## 2015-03-14 DIAGNOSIS — I480 Paroxysmal atrial fibrillation: Secondary | ICD-10-CM | POA: Diagnosis present

## 2015-03-14 DIAGNOSIS — Z87891 Personal history of nicotine dependence: Secondary | ICD-10-CM | POA: Diagnosis not present

## 2015-03-14 DIAGNOSIS — R410 Disorientation, unspecified: Secondary | ICD-10-CM | POA: Diagnosis not present

## 2015-03-14 DIAGNOSIS — M545 Low back pain: Secondary | ICD-10-CM | POA: Diagnosis present

## 2015-03-14 DIAGNOSIS — I6340 Cerebral infarction due to embolism of unspecified cerebral artery: Secondary | ICD-10-CM

## 2015-03-14 DIAGNOSIS — E785 Hyperlipidemia, unspecified: Secondary | ICD-10-CM | POA: Diagnosis present

## 2015-03-14 DIAGNOSIS — M8580 Other specified disorders of bone density and structure, unspecified site: Secondary | ICD-10-CM | POA: Diagnosis present

## 2015-03-14 DIAGNOSIS — M549 Dorsalgia, unspecified: Secondary | ICD-10-CM | POA: Diagnosis not present

## 2015-03-14 DIAGNOSIS — M170 Bilateral primary osteoarthritis of knee: Secondary | ICD-10-CM | POA: Diagnosis not present

## 2015-03-14 DIAGNOSIS — M5386 Other specified dorsopathies, lumbar region: Secondary | ICD-10-CM | POA: Diagnosis not present

## 2015-03-14 DIAGNOSIS — G8929 Other chronic pain: Secondary | ICD-10-CM | POA: Diagnosis present

## 2015-03-14 DIAGNOSIS — Z9181 History of falling: Secondary | ICD-10-CM | POA: Diagnosis not present

## 2015-03-14 DIAGNOSIS — I69359 Hemiplegia and hemiparesis following cerebral infarction affecting unspecified side: Secondary | ICD-10-CM | POA: Diagnosis not present

## 2015-03-14 DIAGNOSIS — G9340 Encephalopathy, unspecified: Secondary | ICD-10-CM | POA: Diagnosis not present

## 2015-03-14 DIAGNOSIS — E669 Obesity, unspecified: Secondary | ICD-10-CM | POA: Diagnosis present

## 2015-03-14 DIAGNOSIS — I6322 Cerebral infarction due to unspecified occlusion or stenosis of basilar arteries: Secondary | ICD-10-CM | POA: Diagnosis not present

## 2015-03-14 DIAGNOSIS — R4182 Altered mental status, unspecified: Secondary | ICD-10-CM

## 2015-03-14 DIAGNOSIS — R079 Chest pain, unspecified: Secondary | ICD-10-CM | POA: Diagnosis not present

## 2015-03-14 DIAGNOSIS — I5033 Acute on chronic diastolic (congestive) heart failure: Secondary | ICD-10-CM | POA: Diagnosis present

## 2015-03-14 DIAGNOSIS — Z7901 Long term (current) use of anticoagulants: Secondary | ICD-10-CM | POA: Diagnosis not present

## 2015-03-14 DIAGNOSIS — Z9010 Acquired absence of unspecified breast and nipple: Secondary | ICD-10-CM | POA: Diagnosis not present

## 2015-03-14 DIAGNOSIS — Z6837 Body mass index (BMI) 37.0-37.9, adult: Secondary | ICD-10-CM | POA: Diagnosis not present

## 2015-03-14 DIAGNOSIS — N189 Chronic kidney disease, unspecified: Secondary | ICD-10-CM | POA: Insufficient documentation

## 2015-03-14 DIAGNOSIS — K219 Gastro-esophageal reflux disease without esophagitis: Secondary | ICD-10-CM | POA: Diagnosis present

## 2015-03-14 DIAGNOSIS — I63219 Cerebral infarction due to unspecified occlusion or stenosis of unspecified vertebral arteries: Secondary | ICD-10-CM | POA: Diagnosis not present

## 2015-03-14 DIAGNOSIS — I63432 Cerebral infarction due to embolism of left posterior cerebral artery: Principal | ICD-10-CM

## 2015-03-14 DIAGNOSIS — C7800 Secondary malignant neoplasm of unspecified lung: Secondary | ICD-10-CM | POA: Diagnosis present

## 2015-03-14 DIAGNOSIS — I639 Cerebral infarction, unspecified: Secondary | ICD-10-CM | POA: Diagnosis present

## 2015-03-14 DIAGNOSIS — G8191 Hemiplegia, unspecified affecting right dominant side: Secondary | ICD-10-CM | POA: Diagnosis present

## 2015-03-14 DIAGNOSIS — I63212 Cerebral infarction due to unspecified occlusion or stenosis of left vertebral arteries: Secondary | ICD-10-CM | POA: Diagnosis not present

## 2015-03-14 DIAGNOSIS — Z888 Allergy status to other drugs, medicaments and biological substances status: Secondary | ICD-10-CM | POA: Diagnosis not present

## 2015-03-14 DIAGNOSIS — E119 Type 2 diabetes mellitus without complications: Secondary | ICD-10-CM | POA: Diagnosis present

## 2015-03-14 DIAGNOSIS — I4891 Unspecified atrial fibrillation: Secondary | ICD-10-CM | POA: Diagnosis not present

## 2015-03-14 DIAGNOSIS — Z823 Family history of stroke: Secondary | ICD-10-CM | POA: Diagnosis not present

## 2015-03-14 DIAGNOSIS — H9190 Unspecified hearing loss, unspecified ear: Secondary | ICD-10-CM | POA: Diagnosis present

## 2015-03-14 DIAGNOSIS — H548 Legal blindness, as defined in USA: Secondary | ICD-10-CM

## 2015-03-14 DIAGNOSIS — Z79811 Long term (current) use of aromatase inhibitors: Secondary | ICD-10-CM | POA: Diagnosis not present

## 2015-03-14 DIAGNOSIS — R413 Other amnesia: Secondary | ICD-10-CM | POA: Diagnosis present

## 2015-03-14 DIAGNOSIS — C50919 Malignant neoplasm of unspecified site of unspecified female breast: Secondary | ICD-10-CM | POA: Diagnosis present

## 2015-03-14 DIAGNOSIS — I10 Essential (primary) hypertension: Secondary | ICD-10-CM

## 2015-03-14 DIAGNOSIS — G4700 Insomnia, unspecified: Secondary | ICD-10-CM | POA: Diagnosis present

## 2015-03-14 DIAGNOSIS — Z8249 Family history of ischemic heart disease and other diseases of the circulatory system: Secondary | ICD-10-CM | POA: Diagnosis not present

## 2015-03-14 DIAGNOSIS — M1611 Unilateral primary osteoarthritis, right hip: Secondary | ICD-10-CM

## 2015-03-14 DIAGNOSIS — I739 Peripheral vascular disease, unspecified: Secondary | ICD-10-CM | POA: Diagnosis present

## 2015-03-14 DIAGNOSIS — K5900 Constipation, unspecified: Secondary | ICD-10-CM | POA: Diagnosis present

## 2015-03-14 DIAGNOSIS — I110 Hypertensive heart disease with heart failure: Secondary | ICD-10-CM | POA: Diagnosis present

## 2015-03-14 DIAGNOSIS — F411 Generalized anxiety disorder: Secondary | ICD-10-CM | POA: Diagnosis present

## 2015-03-14 DIAGNOSIS — R29708 NIHSS score 8: Secondary | ICD-10-CM | POA: Diagnosis present

## 2015-03-14 LAB — LIPID PANEL
Cholesterol: 176 mg/dL (ref 0–200)
HDL: 50 mg/dL (ref >40)
LDL CALC: 112 mg/dL — AB (ref 0–99)
Total CHOL/HDL Ratio: 3.5 RATIO
Triglycerides: 71 mg/dL (ref <150)
VLDL: 14 mg/dL (ref 0–40)

## 2015-03-14 LAB — GLUCOSE, CAPILLARY
Glucose-Capillary: 104 mg/dL — ABNORMAL HIGH (ref 65–99)
Glucose-Capillary: 111 mg/dL — ABNORMAL HIGH (ref 65–99)
Glucose-Capillary: 116 mg/dL — ABNORMAL HIGH (ref 65–99)
Glucose-Capillary: 132 mg/dL — ABNORMAL HIGH (ref 65–99)
Glucose-Capillary: 146 mg/dL — ABNORMAL HIGH (ref 65–99)

## 2015-03-14 LAB — TROPONIN I

## 2015-03-14 MED ORDER — IOHEXOL 350 MG/ML SOLN
75.0000 mL | Freq: Once | INTRAVENOUS | Status: AC | PRN
  Administered 2015-03-14: 100 mL via INTRAVENOUS

## 2015-03-14 MED ORDER — ATORVASTATIN CALCIUM 20 MG PO TABS
20.0000 mg | ORAL_TABLET | Freq: Every day | ORAL | Status: DC
  Administered 2015-03-14: 20 mg via ORAL
  Filled 2015-03-14: qty 1

## 2015-03-14 MED ORDER — ATORVASTATIN CALCIUM 10 MG PO TABS: 10.0000 mg | ORAL_TABLET | Freq: Every day | ORAL | Status: DC

## 2015-03-14 MED ORDER — HYDRALAZINE HCL 20 MG/ML IJ SOLN
5.0000 mg | Freq: Four times a day (QID) | INTRAMUSCULAR | Status: DC | PRN
  Administered 2015-03-15: 5 mg via INTRAVENOUS
  Filled 2015-03-14: qty 1

## 2015-03-14 MED ORDER — PRAVASTATIN SODIUM 40 MG PO TABS
40.0000 mg | ORAL_TABLET | Freq: Every day | ORAL | Status: DC
  Administered 2015-03-15: 40 mg via ORAL
  Filled 2015-03-14: qty 1

## 2015-03-14 NOTE — Progress Notes (Signed)
Rehab admissions - I stopped by the room and spoke with family.  I called and left a message with patient's daughter regarding rehab options.  I will follow up tomorrow after I hear back from daughter.  Call me for questions.  [id number]

## 2015-03-14 NOTE — Progress Notes (Signed)
STROKE TEAM PROGRESS NOTE   HISTORY OF PRESENT ILLNESS Rose Sullivan is an 82 y.o. female patient who was brought into the emergency room due to change in mental status, increased lethargy, memory problems noted by her daughter about 2 days ago. At baseline patient has poor functional status, typically stays in bed most of the time . She is noted to be legally blind, history of atrial fibrillation and was on Coumadin until about one year ago but was discontinued due to frequent falls . She is currently not on any anticoagulant or antiplatelet therapy . She occasionally takes BC powders . History obtained from her daughter. Patient unable to provide any history . Other than the change in mental status, her daughter is not aware of any other new neurological symptoms. She was last known well over 2 days ago. Patient was not administered TPA secondary to delay in arrival. She was admitted for further evaluation and treatment.   SUBJECTIVE (INTERVAL HISTORY) No family is at the bedside. She is sitting up in the gerichair. She knows where she is but not the year. She knows she had a stroke 3 days ago.    OBJECTIVE Temp:  [97.3 F (36.3 C)-98.8 F (37.1 C)] 98.4 F (36.9 C) (02/13 0411) Pulse Rate:  [71-94] 81 (02/13 0411) Cardiac Rhythm:  [-] Normal sinus rhythm (02/13 0700) Resp:  [13-23] 18 (02/13 0411) BP: (154-191)/(78-103) 191/103 mmHg (02/13 0411) SpO2:  [94 %-98 %] 95 % (02/13 0411) Weight:  [86.864 kg (191 lb 8 oz)] 86.864 kg (191 lb 8 oz) (02/12 1852)  CBC:   Recent Labs Lab 03/13/15 1340  WBC 9.2  NEUTROABS 6.3  HGB 15.1*  HCT 45.4  MCV 93.6  PLT [id number]    Basic Metabolic Panel:   Recent Labs Lab 03/13/15 1340  NA 143  K 4.2  CL 106  CO2 21*  GLUCOSE 127*  BUN 19  CREATININE 1.25*  CALCIUM 9.0    Lipid Panel:     Component Value Date/Time   CHOL 176 03/14/2015 0039   TRIG 71 03/14/2015 0039   TRIG 134 12/18/2005 1533   HDL 50 03/14/2015 0039   CHOLHDL  3.5 03/14/2015 0039   CHOLHDL 4.4 CALC 12/18/2005 1533   VLDL 14 03/14/2015 0039   LDLCALC 112* 03/14/2015 0039   HgbA1c:  Lab Results  Component Value Date   HGBA1C 5.8 08/05/2013   Urine Drug Screen:     Component Value Date/Time   LABOPIA POSITIVE* 03/13/2015 1317   LABOPIA PPS 08/19/2014 1354   COCAINSCRNUR NONE DETECTED 03/13/2015 1317   COCAINSCRNUR NEG 08/19/2014 1354   LABBENZ POSITIVE* 03/13/2015 1317   LABBENZ NEG 08/19/2014 1354   AMPHETMU NONE DETECTED 03/13/2015 1317   AMPHETMU NEG 08/19/2014 1354   THCU NONE DETECTED 03/13/2015 1317   THCU NEG 08/19/2014 1354   LABBARB NONE DETECTED 03/13/2015 1317   LABBARB NEG 08/19/2014 1354      IMAGING I have personally reviewed the radiological images below and agree with the radiology interpretations.  Dg Chest 2 View 03/13/2015   Mild CHF, with mild to moderate cardiomegaly, mild diffuse interstitial pulmonary edema and small bilateral pleural effusions.   Ct Head Wo Contrast 03/13/2015  Similar findings of advanced atrophy and microvascular ischemic disease without acute intracranial process.   CTA NECK 03/14/2015   Negative.   CTA HEAD 03/14/2015   Occluded distal LEFT P1 segment. Moderate stenosis RIGHT P2 segment.   Mr Brain Wo Contrast 03/13/2015   1.  Small acute/early subacute infarcts in the left thalamus and mesial left temporal lobe. 2. Extensive chronic small vessel ischemic disease and cerebral atrophy.   TTE - - Left ventricle: The cavity size was normal. There was mild focal basal hypertrophy of the septum. Systolic function was normal. The estimated ejection fraction was in the range of 55% to 60%. Wall motion was normal; there were no regional wall motion abnormalities. - Mitral valve: There was mild regurgitation. - Pulmonic valve: There was trivial regurgitation  EEG - pending   PHYSICAL EXAM  Temp:  [97.5 F (36.4 C)-98.8 F (37.1 C)] 98.3 F (36.8 C) (02/13 1411) Pulse Rate:   [75-94] 78 (02/13 1411) Resp:  [13-23] 20 (02/13 1411) BP: (155-191)/(78-103) 155/87 mmHg (02/13 1411) SpO2:  [94 %-98 %] 97 % (02/13 1411) Weight:  [191 lb 8 oz (86.864 kg)] 191 lb 8 oz (86.864 kg) (02/12 1852)  General - Well nourished, well developed, in no apparent distress.  Ophthalmologic - Fundi not visualized due to noncooperation.  Cardiovascular - Regular rate and rhythm with no murmur, not in afib.  Mental Status -  Level of arousal and orientation to place and self were intact, but not orientated to age, time or people. Language including repetition, comprehension was assessed and found intact, but paucity of speech with naming 1/4. Fund of Knowledge was assessed and was impaired without knowing current presidents.  Cranial Nerves II - XII - II - Visual field testing questionable for right visual field neglect. III, IV, VI - Extraocular movements intact. V - Facial sensation intact bilaterally. VII - Facial movement intact bilaterally. VIII - Hard of hearing & vestibular intact bilaterally. X - Palate elevates symmetrically, mild to moderate dysarthria. XI - Chin turning & shoulder shrug intact bilaterally. XII - Tongue protrusion intact.  Motor Strength - The patient's strength was RUE proximal 3-/5, distal 4+/5, RLE proximal 4/5, distal 3/5. LUE and LLE 5-/5.  Bulk was normal and fasciculations were absent.   Motor Tone - Muscle tone was assessed at the neck and appendages and was normal.  Reflexes - The patient's reflexes were 1+ in all extremities and she had no pathological reflexes.  Sensory - Light touch, temperature/pinprick were assessed and were symmetrical.    Coordination - not cooperative on exam.  Tremor was absent.  Gait and Station - not tested due to safety concerns.   ASSESSMENT/PLAN [name] is a 82 y.o. female with history of hypertension, hyperlipidemia, diabetes mellitus, chronic pain, atrial fibrillation presenting with change  in mental status, increased lethargy, memory problems. She did not receive IV t-PA due to delay in arrival.   Stroke:  L thalamic and L mesial temporal lobe infarct embolic secondary to known atrial fibrillation   Resultant  Mild R hemiparesis, ? R visual neglect  MRI  Left thalamic and left mesial temporal lobe infarcts. Chronic small vessel disease and atrophy.  CTA head  L P1 occlusion. Atrophy and microvascular disease  CTA neck Unremarkable     2D Echo  EF 55-60%   EEG - pending  LDL 112  HgbA1c pending  Lovenox 40 mg sq daily for VTE prophylaxis Diet heart healthy/carb modified Room service appropriate?: Yes; Fluid consistency:: Thin  No antithrombotic prior to admission, now on clopidogrel 75 mg daily. Recommend anticoagulation for stroke prevention, see below.  Patient counseled to be compliant with her antithrombotic medications  Ongoing aggressive stroke risk factor management  Therapy recommendations:  pending   Disposition:  pending  Atrial Fibrillation  Home anticoagulation:  none   Currently in NSR  Coumadin stopped 1 year ago due to falls. No fall in the past 1 year per family  Recommend anticoagulation at discharge (Cr 1.25).    Hypertension  Elevated 180-190s  BP goal gradually down to normotensive  Hyperlipidemia  Home meds:  No statin  LDL 112, goal < 70  Added lipitor 20  Continue statin at discharge  Diabetes  HgbA1c pending, goal < 7.0  Other Stroke Risk Factors  Advanced age  Obesity, Body mass index is 37.4 kg/(m^2).   Family hx stroke (mother)  Other Active Problems  Legally blind  Chest pain  Acute on chronic diastolic HR  Chronic back pain  Insomnia  Hx metastatic breast cancer - but pt stated cancer now in remission, no active cancer.  Hospital day # 1  Rosalin Hawking, MD PhD Stroke Neurology 03/14/2015 2:37 PM    To contact Stroke Continuity provider, please refer to [email or web address] After hours, contact  General Neurology

## 2015-03-14 NOTE — Progress Notes (Signed)
PROGRESS NOTE  Rose Sullivan [id number] DOB: 08/01/1933 DOA: 03/13/2015 PCP: [name], MD  HPI: 82 y.o. female has a past medical history significant for hypertension, hyperlipidemia, diabetes mellitus, chronic pain, is being brought by her daughter from home with a chief complaint of altered mental status and weakness for the past couple of days.  Subjective / 24 H Interval events - no chest pain, shortness of breath, no abdominal pain, nausea or vomiting. - feels "good"   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   Essential hypertension   Atrial fibrillation (HCC)   LOW BACK PAIN, CHRONIC   Breast cancer metastasized to lung (HCC)   Acute encephalopathy   DM (diabetes mellitus) ([place])   Cerebral embolism with cerebral infarction   CVA - MRI positive for CVA, neuro consulted, appreciate input - Discussed with Dr. Tomie China this morning, recommending anticoagulation, will start on discharge - 2-D echo, EEG pending  Paroxysmal atrial fibrillation - We'll start anticoagulation tomorrow as per neurology recommendations  Hypertension - Allow permissive hypertension in the setting of CVA, reintroduce home medications within 4-5 days as blood pressure allows  Acute encephalopathy - Mild confusion on presentation, persistent today, this is likely due to her CVA  Diabetes mellitus - Continue sliding scale  Acute on Chronic diastolic heart failure - Most recent 2-D echo was in 2011 showed grade on diastolic dysfunction with an EF of 60-65%. Given cardiomegaly and pulmonary edema on chest x-ray, we'll repeat a 2-D echo  History of metastasized breast cancer - apparently in remission  Chest pain - Resolved, cardiac enzymes negative   Diet: Diet heart healthy/carb modified Room service appropriate?: Yes; Fluid consistency:: Thin Fluids: none  DVT Prophylaxis: Lovenox  Code Status: Partial Code Family Communication: d/w daughter  Disposition  Plan: CIR consult pending  Barriers to discharge: CVA workup  Consultants:  Neurology   Procedures:  None    Antibiotics  Anti-infectives    None       Studies  Ct Angio Head W/cm &/or Wo Cm  03/14/2015  CLINICAL DATA:  Altered mental status, increased lethargy and memory problems for 2 days. History of atrial fibrillation on Coumadin, now discontinued due to frequent falls. Poor historian. Assess acute stroke. EXAM: CT ANGIOGRAPHY HEAD AND NECK TECHNIQUE: Multidetector CT imaging of the head and neck was performed using the standard protocol during bolus administration of intravenous contrast. Multiplanar CT image reconstructions and MIPs were obtained to evaluate the vascular anatomy. Carotid stenosis measurements (when applicable) are obtained utilizing NASCET criteria, using the distal internal carotid diameter as the denominator. CONTRAST:  153mL OMNIPAQUE IOHEXOL 350 MG/ML SOLN COMPARISON:  MRI of the brain March 13, 2015 FINDINGS: CTA NECK Aortic arch: Normal appearance of the thoracic arch, 2 vessel arch is a normal variant. The origins of the innominate, left Common carotid artery and subclavian artery are widely patent. Right carotid system: Common carotid artery is widely patent, coursing in a straight line fashion. Normal appearance of the carotid bifurcation without hemodynamically significant stenosis by NASCET criteria. Minimal eccentric calcific atherosclerosis. Normal appearance of the included internal carotid artery. Left carotid system: Common carotid artery is widely patent, coursing in a straight line fashion. Normal appearance of the carotid bifurcation without hemodynamically significant stenosis by NASCET criteria. Minimal eccentric calcific atherosclerosis. Normal appearance of the included internal carotid artery. Vertebral arteries:Left vertebral artery is dominant. Normal appearance of the vertebral arteries, which appear widely patent. Skeleton: No acute  osseous process though bone windows have  not been submitted. Moderate to severe degenerative change of the cervical spine without destructive bony lesions. Multiple absent teeth. Other neck: Soft tissues of the neck are nonacute though, not tailored for evaluation. CTA HEAD Anterior circulation: Normal appearance of the cervical internal carotid arteries, petrous, cavernous and supra clinoid internal carotid arteries. Widely patent anterior communicating artery. Patent anterior and middle cerebral arteries. Mild stenosis LEFT M2 segment. Posterior circulation: Normal appearance of the vertebral arteries, vertebrobasilar junction and basilar artery, as well as main branch vessels. Occluded distal LEFT P1 segment. Moderate stenosis RIGHT P2 segment. No dissection, luminal irregularity, contrast extravasation or aneurysm within the anterior nor posterior circulation. No abnormal intracranial enhancement. IMPRESSION: CTA NECK:  Negative. CTA HEAD:  Occluded distal LEFT P1 segment. Moderate stenosis RIGHT P2 segment. Electronically Signed   By: Elon Alas M.D.   On: 03/14/2015 05:13   Dg Chest 2 View  03/13/2015  CLINICAL DATA:  82 year old presenting with acute mental status changes and somnolence. Right upper extremity weakness. Current history of hypertension. Personal history of breast cancer. EXAM: CHEST  2 VIEW COMPARISON:  11/19/2013 and earlier. FINDINGS: Cardiac silhouette mildly to moderately enlarged. Thoracic aorta tortuous and atherosclerotic, unchanged. Hilar and mediastinal contours otherwise unremarkable. Mild diffuse interstitial pulmonary edema. Small bilateral pleural effusions. The no confluent airspace consolidation. Degenerative changes and DISH involving the thoracic spine. IMPRESSION: Mild CHF, with mild to moderate cardiomegaly, mild diffuse interstitial pulmonary edema and small bilateral pleural effusions. Electronically Signed   By: Evangeline Dakin M.D.   On: 03/13/2015 14:22   Ct  Head Wo Contrast  03/13/2015  CLINICAL DATA:  Altered mental status. EXAM: CT HEAD WITHOUT CONTRAST TECHNIQUE: Contiguous axial images were obtained from the base of the skull through the vertex without intravenous contrast. COMPARISON:  12/18/2012 FINDINGS: Similar findings of advanced atrophy with sulcal prominence centralized volume loss with commensurate ex vacuo dilatation of the ventricular system. Extensive nearly confluent periventricular hypodensities are unchanged compatible microvascular ischemic disease. Old lacunar infarcts within the left side of the pons (image 9, series 3) as well as the bilateral insular cortices (image 16). Given extensive background parenchymal abnormalities, there is no CT evidence of superimposed acute large territory infarct. No intraparenchymal or extra-axial mass or hemorrhage. Unchanged size and configuration of the ventricles and basilar cisterns. No midline shift. Intracranial atherosclerosis. Limited visualization of the paranasal sinuses and mastoid air cells is normal. No air-fluid levels. Debris is noted within the bilateral external auditory canals. Regional soft tissues appear normal. No displaced calvarial fracture. IMPRESSION: Similar findings of advanced atrophy and microvascular ischemic disease without acute intracranial process. Electronically Signed   By: Sandi Mariscal M.D.   On: 03/13/2015 14:36   Ct Angio Neck W/cm &/or Wo/cm  03/14/2015  CLINICAL DATA:  Altered mental status, increased lethargy and memory problems for 2 days. History of atrial fibrillation on Coumadin, now discontinued due to frequent falls. Poor historian. Assess acute stroke. EXAM: CT ANGIOGRAPHY HEAD AND NECK TECHNIQUE: Multidetector CT imaging of the head and neck was performed using the standard protocol during bolus administration of intravenous contrast. Multiplanar CT image reconstructions and MIPs were obtained to evaluate the vascular anatomy. Carotid stenosis measurements (when  applicable) are obtained utilizing NASCET criteria, using the distal internal carotid diameter as the denominator. CONTRAST:  140mL OMNIPAQUE IOHEXOL 350 MG/ML SOLN COMPARISON:  MRI of the brain March 13, 2015 FINDINGS: CTA NECK Aortic arch: Normal appearance of the thoracic arch, 2 vessel arch is a normal variant. The origins  of the innominate, left Common carotid artery and subclavian artery are widely patent. Right carotid system: Common carotid artery is widely patent, coursing in a straight line fashion. Normal appearance of the carotid bifurcation without hemodynamically significant stenosis by NASCET criteria. Minimal eccentric calcific atherosclerosis. Normal appearance of the included internal carotid artery. Left carotid system: Common carotid artery is widely patent, coursing in a straight line fashion. Normal appearance of the carotid bifurcation without hemodynamically significant stenosis by NASCET criteria. Minimal eccentric calcific atherosclerosis. Normal appearance of the included internal carotid artery. Vertebral arteries:Left vertebral artery is dominant. Normal appearance of the vertebral arteries, which appear widely patent. Skeleton: No acute osseous process though bone windows have not been submitted. Moderate to severe degenerative change of the cervical spine without destructive bony lesions. Multiple absent teeth. Other neck: Soft tissues of the neck are nonacute though, not tailored for evaluation. CTA HEAD Anterior circulation: Normal appearance of the cervical internal carotid arteries, petrous, cavernous and supra clinoid internal carotid arteries. Widely patent anterior communicating artery. Patent anterior and middle cerebral arteries. Mild stenosis LEFT M2 segment. Posterior circulation: Normal appearance of the vertebral arteries, vertebrobasilar junction and basilar artery, as well as main branch vessels. Occluded distal LEFT P1 segment. Moderate stenosis RIGHT P2 segment. No  dissection, luminal irregularity, contrast extravasation or aneurysm within the anterior nor posterior circulation. No abnormal intracranial enhancement. IMPRESSION: CTA NECK:  Negative. CTA HEAD:  Occluded distal LEFT P1 segment. Moderate stenosis RIGHT P2 segment. Electronically Signed   By: Elon Alas M.D.   On: 03/14/2015 05:13   Mr Brain Wo Contrast  03/13/2015  CLINICAL DATA:  TIA. Altered mental status and weakness for the past couple of days. EXAM: MRI HEAD WITHOUT CONTRAST TECHNIQUE: Multiplanar, multiecho pulse sequences of the brain and surrounding structures were obtained without intravenous contrast. COMPARISON:  Head CT 03/13/2015 FINDINGS: Some sequences are moderately motion degraded. There is abnormal diffusion-weighted signal involving the mesial left temporal lobe/hippocampus as well as lateral aspect of the left thalamus with evidence of mildly reduced ADC. There is at most minimal associated edema/ T2 hyperintensity. There is moderate cerebral atrophy. A few chronic cerebral microhemorrhages are noted. Confluent T2 hyperintensities in the cerebral white matter bilaterally are nonspecific but compatible with extensive chronic small vessel ischemic disease. Chronic lacunar infarcts are noted in the right thalamus and deep cerebral white matter bilaterally. No mass, midline shift, or extra-axial fluid collection is seen. Prior bilateral cataract extraction is noted. Paranasal sinuses and mastoid air cells are clear. Major intracranial vascular flow voids are grossly preserved, although evaluation is limited by motion artifact and the left PCA is not well seen. IMPRESSION: 1. Small acute/early subacute infarcts in the left thalamus and mesial left temporal lobe. 2. Extensive chronic small vessel ischemic disease and cerebral atrophy. Electronically Signed   By: Logan Bores M.D.   On: 03/13/2015 18:09    Objective  Filed Vitals:   03/13/15 1852 03/13/15 2221 03/14/15 0034 03/14/15  0411  BP: 174/78 182/88 187/94 191/103  Pulse: 94 83 86 81  Temp: 98.8 F (37.1 C) 97.5 F (36.4 C) 98.5 F (36.9 C) 98.4 F (36.9 C)  TempSrc: Oral Oral Oral Oral  Resp: 20 20 20 18   Height: 5' (1.524 m)     Weight: 86.864 kg (191 lb 8 oz)     SpO2: 98% 98% 98% 95%    Intake/Output Summary (Last 24 hours) at 03/14/15 1032 Last data filed at 03/14/15 0944  Gross per 24 hour  Intake    160 ml  Output    200 ml  Net    -40 ml   Filed Weights   03/13/15 1852  Weight: 86.864 kg (191 lb 8 oz)    Exam:  GENERAL: NAD  HEENT: no scleral icterus, PERRL  NECK: supple, no LAD  LUNGS: CTA biL, no wheezing  HEART: RRR without MRG  ABDOMEN: soft, non tender  EXTREMITIES: no clubbing / cyanosis  NEUROLOGIC: Right upper extremity weakness, otherwise 5 out of 5  SKIN: no rashes  Data Reviewed: Basic Metabolic Panel:  Recent Labs Lab 03/13/15 1340  NA 143  K 4.2  CL 106  CO2 21*  GLUCOSE 127*  BUN 19  CREATININE 1.25*  CALCIUM 9.0   Liver Function Tests:  Recent Labs Lab 03/13/15 1340  AST 46*  ALT 24  ALKPHOS 69  BILITOT 1.8*  PROT 7.1  ALBUMIN 3.5   CBC:  Recent Labs Lab 03/13/15 1340  WBC 9.2  NEUTROABS 6.3  HGB 15.1*  HCT 45.4  MCV 93.6  PLT 259   Cardiac Enzymes:  Recent Labs Lab 03/13/15 1340 03/13/15 1849 03/14/15 0039 03/14/15 0657  CKTOTAL 159  --   --   --   TROPONINI  --  <0.03 <0.03 <0.03   CBG:  Recent Labs Lab 03/13/15 2217 03/14/15 0027 03/14/15 0408 03/14/15 0740  GLUCAP 152* 132* 111* 116*    Scheduled Meds: .  stroke: mapping our early stages of recovery book   Does not apply Once  . clopidogrel  75 mg Oral Daily  . diclofenac sodium  2 g Topical QID  . diltiazem  240 mg Oral Daily  . enoxaparin (LOVENOX) injection  40 mg Subcutaneous Q24H  . exemestane  25 mg Oral Daily  . insulin aspart  0-9 Units Subcutaneous TID WC   Continuous Infusions:   Time spent coordinating care: 35 minutes, greater than  50% of this time was spent bedside discussing with family members and answering questions.    Marzetta Board, MD [facility] Pager [phone]. If 7 PM - 7 AM, please contact night-coverage at [email or web address], password [facility] 03/14/2015, 10:32 AM

## 2015-03-14 NOTE — Progress Notes (Signed)
Echocardiogram 2D Echocardiogram has been performed.  Joelene Millin 03/14/2015, 9:47 AM

## 2015-03-14 NOTE — Consult Note (Signed)
Physical Medicine and Rehabilitation Consult Reason for Consult: Left thalamic and left mesial temporal lobe infarct  Referring Physician: [facility]   HPI: Rose Sullivan is a 82 y.o. right handed female with history of hypertension, hyperlipidemia, chronic back pain, legally blind and atrial fibrillation. By report patient lives with children as well as a personal care attendant who provide assistance as needed. Patient states that she needed help with all ADLs prior to admission. One level home with ramped entrance. Ambulated household distances with a walker. Presented 03/13/2015 with altered mental status, increasing lethargy and progressive weakness over the previous couple of days as well as memory problems. MRI of the brain showed small acute or early subacute infarct left thalamus and mesial left temporal lobe. Extensive chronic small vessel disease. Patient did not receive TPA. CTA of neck negative. CTA of the head showed occluded distal left P1 segment. Echocardiogram performed.  EEG are pending. Neurology consulted presently on Plavix for CVA prophylaxis. Subcutaneous Lovenox for DVT prophylaxis. Regular diet consistency.   Review of Systems  Constitutional: Positive for malaise/fatigue. Negative for fever and chills.  HENT: Positive for hearing loss.   Eyes:       Legally blind. She can see figures and objects  Respiratory: Negative for cough and shortness of breath.   Cardiovascular: Negative for chest pain.  Gastrointestinal: Positive for constipation. Negative for nausea and vomiting.       GERD  Musculoskeletal: Positive for back pain and falls.  Neurological: Positive for weakness. Negative for seizures and headaches.  Psychiatric/Behavioral:       Anxiety  All other systems reviewed and are negative.  Past Medical History  Diagnosis Date  . GLUCOSE INTOLERANCE 02/11/2007  . HYPERLIPIDEMIA 02/11/2007  . BLINDNESS, [place], [place] DEFINITION 11/25/2006  . Other  specified forms of hearing loss 09/05/2009  . HYPERTENSION 02/11/2007  . Atrial fibrillation ([place]) 03/07/2009  . GERD 02/11/2007  . LOW BACK PAIN, CHRONIC 11/25/2006  . BACK PAIN 12/10/2007  . Pain in Soft Tissues of Limb 02/11/2007  . OSTEOPENIA 02/11/2007  . FATIGUE 12/10/2007  . GAIT DISTURBANCE 12/10/2007  . DYSPNEA 03/18/2009  . Cough 03/07/2009  . CHEST PAIN 12/10/2007  . Abdominal pain, left lower quadrant 04/01/2007  . BREAST CANCER, HX OF 11/25/2006  . Nocturia 05/18/2010  . Hypersomnia 05/18/2010  . Impaired glucose tolerance 05/18/2010  . Allergic rhinitis, cause unspecified 07/23/2012  . Anxiety state, unspecified 02/04/2013  . Cancer [facility])    Past Surgical History  Procedure Laterality Date  . Dilation and curettage of uterus    . S/p mastectomy    . Appendectomy     Family History  Problem Relation Age of Onset  . Hypertension Brother   . Lung cancer Brother   . Colon cancer Father   . COPD Brother   . Stroke Mother    Social History:  reports that she has quit smoking. She has never used smokeless tobacco. She reports that she does not drink alcohol or use illicit drugs. Allergies:  Allergies  Allergen Reactions  . Trazodone And Nefazodone Other (See Comments)    hallucinations   Medications Prior to Admission  Medication Sig Dispense Refill  . diclofenac sodium (VOLTAREN) 1 % GEL Apply 1 application topically 4 (four) times daily. Apply to both knees and hands 3 Tube 4  . diltiazem (CARDIZEM CD) 240 MG 24 hr capsule TAKE ONE CAPSULE BY MOUTH EVERY DAY 30 capsule 6  . exemestane (AROMASIN) 25 MG  tablet Take 1 tablet (25 mg total) by mouth daily. 90 tablet 3  . gabapentin (NEURONTIN) 100 MG capsule Take 100 mg by mouth 3 (three) times daily.    Marland Kitchen glucose blood (ONE TOUCH ULTRA TEST) test strip Use as instructed once daily to check blood sugar.  ICD 10 code R73.02 100 each 11  . Lancets (ONETOUCH ULTRASOFT) lancets Use as instructed once daily to check blood sugar.  ICD 10  code R73.02 100 each 11  . losartan (COZAAR) 50 MG tablet Take 50 mg by mouth daily.    Marland Kitchen oxyCODONE-acetaminophen (PERCOCET) 7.5-325 MG tablet Take 1 tablet by mouth every 8 (eight) hours as needed. 120 tablet 0  . temazepam (RESTORIL) 30 MG capsule TAKE ONE CAPSULE BY MOUTH AT BEDTIME AS NEEDED FOR SLEEP 90 capsule 1    Home: Home Living Family/patient expects to be discharged to:: Private residence Living Arrangements: Children Available Help at Discharge: Family, Personal care attendant Type of Home: House Home Access: Ramped entrance Home Layout: One level Home Equipment: Environmental consultant - 2 wheels, Wheelchair - manual  Functional History: Prior Function Level of Independence: Needs assistance Gait / Transfers Assistance Needed: Only amb household distances with  walker Functional Status:  Mobility: Bed Mobility Overal bed mobility: Needs Assistance Bed Mobility: Sidelying to Sit Sidelying to sit: Mod assist General bed mobility comments: Assist to elevate trunk and bring hips to EOB. Pt with decr control of RUE Transfers Overall transfer level: Needs assistance Equipment used: Rolling walker (2 wheeled), Ambulation equipment used Transfer via Lift Equipment: Stedy Transfers: Sit to/from Stand, Risk manager Sit to Stand: +2 physical assistance, Mod assist Stand pivot transfers: +2 physical assistance, Mod assist (with Stedy) General transfer comment: Assist to bring hips up and control RUE and for balance. Pt with significant apraxia and ataxia. Pt unable to keep RUE anywhere static. Pt also with difficulty placing RLE. Stood with walker on Sullivan attempt but didn't attempt pivot due to lack of control of RUE and RLE. Used Stedy for transfer to chair then to bsc then back to chair.      ADL:    Cognition: Cognition Overall Cognitive Status: Impaired/Different from baseline Orientation Level: Oriented to person, Oriented to place, Oriented to situation, Disoriented to  time Cognition Arousal/Alertness: Awake/alert Behavior During Therapy: Anxious Overall Cognitive Status: Impaired/Different from baseline Area of Impairment: Awareness, Attention, Following commands, Safety/judgement, Problem solving Current Attention Level: Sustained Memory: Decreased short-term memory Following Commands: Follows one step commands consistently, Follows one step commands with increased time Safety/Judgement: Decreased awareness of safety, Decreased awareness of deficits Problem Solving: Difficulty sequencing, Requires verbal cues, Requires tactile cues, Slow processing  Blood pressure 191/103, pulse 81, temperature 98.4 F (36.9 C), temperature source Oral, resp. rate 18, height 5' (1.524 m), weight 86.864 kg (191 lb 8 oz), SpO2 95 %. Physical Exam  Vitals reviewed. Constitutional: She appears well-nourished.  HENT:  Head: Normocephalic and atraumatic.  Poor dentition  Eyes: Conjunctivae are normal. Right eye exhibits no discharge.  Left eye amblyopia  Neck: Normal range of motion. Neck supple. No thyromegaly present.  Cardiovascular: Normal rate and regular rhythm.   Respiratory: Effort normal and breath sounds normal. No respiratory distress.  GI: Soft. Bowel sounds are normal. She exhibits no distension.  Musculoskeletal: She exhibits no edema or tenderness.  Neurological: She is alert.  Confused, exam limited due to confusion and cooperation Follows commands.  Cooperative with exam.  A&Ox2 Motor: R UE: Proximally 4 -/5, distally 3 -/5 with  ataxia L UE: 1+/5 proximal distal B/LLE: Hip flexion, knee extension 2/5, ankle dorsi/plantarflexion 5/5 DTRs symmetric Sensation questionably intact to light touch  Skin: Skin is warm and dry.  Psychiatric: Her affect is inappropriate. Cognition and memory are impaired. She expresses inappropriate judgment. She is inattentive.    Results for orders placed or performed during the hospital encounter of 03/13/15 (from the  past 24 hour(s))  Urinalysis, Routine w reflex microscopic     Status: Abnormal   Collection Time: 03/13/15  1:17 PM  Result Value Ref Range   Color, Urine AMBER (A) YELLOW   APPearance CLEAR CLEAR   Specific Gravity, Urine 1.026 1.005 - 1.030   pH 6.0 5.0 - 8.0   Glucose, UA NEGATIVE NEGATIVE mg/dL   Hgb urine dipstick NEGATIVE NEGATIVE   Bilirubin Urine SMALL (A) NEGATIVE   Ketones, ur NEGATIVE NEGATIVE mg/dL   Protein, ur NEGATIVE NEGATIVE mg/dL   Nitrite NEGATIVE NEGATIVE   Leukocytes, UA NEGATIVE NEGATIVE  Urine rapid drug screen (hosp performed)     Status: Abnormal   Collection Time: 03/13/15  1:17 PM  Result Value Ref Range   Opiates POSITIVE (A) NONE DETECTED   Cocaine NONE DETECTED NONE DETECTED   Benzodiazepines POSITIVE (A) NONE DETECTED   Amphetamines NONE DETECTED NONE DETECTED   Tetrahydrocannabinol NONE DETECTED NONE DETECTED   Barbiturates NONE DETECTED NONE DETECTED  Comprehensive metabolic panel     Status: Abnormal   Collection Time: 03/13/15  1:40 PM  Result Value Ref Range   Sodium 143 135 - 145 mmol/L   Potassium 4.2 3.5 - 5.1 mmol/L   Chloride 106 101 - 111 mmol/L   CO2 21 (L) 22 - 32 mmol/L   Glucose, Bld 127 (H) 65 - 99 mg/dL   BUN 19 6 - 20 mg/dL   Creatinine, Ser 1.25 (H) 0.44 - 1.00 mg/dL   Calcium 9.0 8.9 - 10.3 mg/dL   Total Protein 7.1 6.5 - 8.1 g/dL   Albumin 3.5 3.5 - 5.0 g/dL   AST 46 (H) 15 - 41 U/L   ALT 24 14 - 54 U/L   Alkaline Phosphatase 69 38 - 126 U/L   Total Bilirubin 1.8 (H) 0.3 - 1.2 mg/dL   GFR calc non Af Amer 40 (L) >60 mL/min   GFR calc Af Amer 46 (L) >60 mL/min   Anion gap 16 (H) 5 - 15  CBC with Differential     Status: Abnormal   Collection Time: 03/13/15  1:40 PM  Result Value Ref Range   WBC 9.2 4.0 - 10.5 K/uL   RBC 4.85 3.87 - 5.11 MIL/uL   Hemoglobin 15.1 (H) 12.0 - 15.0 g/dL   HCT 45.4 36.0 - 46.0 %   MCV 93.6 78.0 - 100.0 fL   MCH 31.1 26.0 - 34.0 pg   MCHC 33.3 30.0 - 36.0 g/dL   RDW 12.6 11.5 - 15.5 %     Platelets 259 150 - 400 K/uL   Neutrophils Relative % 69 %   Neutro Abs 6.3 1.7 - 7.7 K/uL   Lymphocytes Relative 22 %   Lymphs Abs 2.0 0.7 - 4.0 K/uL   Monocytes Relative 4 %   Monocytes Absolute 0.4 0.1 - 1.0 K/uL   Eosinophils Relative 5 %   Eosinophils Absolute 0.5 0.0 - 0.7 K/uL   Basophils Relative 0 %   Basophils Absolute 0.0 0.0 - 0.1 K/uL  CK     Status: None   Collection Time: 03/13/15  1:40  PM  Result Value Ref Range   Total CK 159 38 - 234 U/L  I-stat troponin, ED     Status: None   Collection Time: 03/13/15  1:45 PM  Result Value Ref Range   Troponin i, poc 0.00 0.00 - 0.08 ng/mL   Comment 3          Troponin I (q 6hr x 3)     Status: None   Collection Time: 03/13/15  6:49 PM  Result Value Ref Range   Troponin I <0.03 <0.031 ng/mL  APTT     Status: None   Collection Time: 03/13/15  8:25 PM  Result Value Ref Range   aPTT 26 24 - 37 seconds  Protime-INR     Status: None   Collection Time: 03/13/15  8:25 PM  Result Value Ref Range   Prothrombin Time 15.0 11.6 - 15.2 seconds   INR 1.16 0.00 - 1.49  Glucose, capillary     Status: Abnormal   Collection Time: 03/13/15 10:17 PM  Result Value Ref Range   Glucose-Capillary 152 (H) 65 - 99 mg/dL  Glucose, capillary     Status: Abnormal   Collection Time: 03/14/15 12:27 AM  Result Value Ref Range   Glucose-Capillary 132 (H) 65 - 99 mg/dL   Comment 1 Notify RN    Comment 2 Document in Chart   Troponin I (q 6hr x 3)     Status: None   Collection Time: 03/14/15 12:39 AM  Result Value Ref Range   Troponin I <0.03 <0.031 ng/mL  Lipid panel     Status: Abnormal   Collection Time: 03/14/15 12:39 AM  Result Value Ref Range   Cholesterol 176 0 - 200 mg/dL   Triglycerides 71 <150 mg/dL   HDL 50 >40 mg/dL   Total CHOL/HDL Ratio 3.5 RATIO   VLDL 14 0 - 40 mg/dL   LDL Cholesterol 112 (H) 0 - 99 mg/dL  Glucose, capillary     Status: Abnormal   Collection Time: 03/14/15  4:08 AM  Result Value Ref Range    Glucose-Capillary 111 (H) 65 - 99 mg/dL  Troponin I (q 6hr x 3)     Status: None   Collection Time: 03/14/15  6:57 AM  Result Value Ref Range   Troponin I <0.03 <0.031 ng/mL  Glucose, capillary     Status: Abnormal   Collection Time: 03/14/15  7:40 AM  Result Value Ref Range   Glucose-Capillary 116 (H) 65 - 99 mg/dL  Glucose, capillary     Status: Abnormal   Collection Time: 03/14/15 11:55 AM  Result Value Ref Range   Glucose-Capillary 146 (H) 65 - 99 mg/dL   Ct Angio Head W/cm &/or Wo Cm  03/14/2015  CLINICAL DATA:  Altered mental status, increased lethargy and memory problems for 2 days. History of atrial fibrillation on Coumadin, now discontinued due to frequent falls. Poor historian. Assess acute stroke. EXAM: CT ANGIOGRAPHY HEAD AND NECK TECHNIQUE: Multidetector CT imaging of the head and neck was performed using the standard protocol during bolus administration of intravenous contrast. Multiplanar CT image reconstructions and MIPs were obtained to evaluate the vascular anatomy. Carotid stenosis measurements (when applicable) are obtained utilizing NASCET criteria, using the distal internal carotid diameter as the denominator. CONTRAST:  147mL OMNIPAQUE IOHEXOL 350 MG/ML SOLN COMPARISON:  MRI of the brain March 13, 2015 FINDINGS: CTA NECK Aortic arch: Normal appearance of the thoracic arch, 2 vessel arch is a normal variant. The origins of the innominate,  left Common carotid artery and subclavian artery are widely patent. Right carotid system: Common carotid artery is widely patent, coursing in a straight line fashion. Normal appearance of the carotid bifurcation without hemodynamically significant stenosis by NASCET criteria. Minimal eccentric calcific atherosclerosis. Normal appearance of the included internal carotid artery. Left carotid system: Common carotid artery is widely patent, coursing in a straight line fashion. Normal appearance of the carotid bifurcation without hemodynamically  significant stenosis by NASCET criteria. Minimal eccentric calcific atherosclerosis. Normal appearance of the included internal carotid artery. Vertebral arteries:Left vertebral artery is dominant. Normal appearance of the vertebral arteries, which appear widely patent. Skeleton: No acute osseous process though bone windows have not been submitted. Moderate to severe degenerative change of the cervical spine without destructive bony lesions. Multiple absent teeth. Other neck: Soft tissues of the neck are nonacute though, not tailored for evaluation. CTA HEAD Anterior circulation: Normal appearance of the cervical internal carotid arteries, petrous, cavernous and supra clinoid internal carotid arteries. Widely patent anterior communicating artery. Patent anterior and middle cerebral arteries. Mild stenosis LEFT M2 segment. Posterior circulation: Normal appearance of the vertebral arteries, vertebrobasilar junction and basilar artery, as well as main branch vessels. Occluded distal LEFT P1 segment. Moderate stenosis RIGHT P2 segment. No dissection, luminal irregularity, contrast extravasation or aneurysm within the anterior nor posterior circulation. No abnormal intracranial enhancement. IMPRESSION: CTA NECK:  Negative. CTA HEAD:  Occluded distal LEFT P1 segment. Moderate stenosis RIGHT P2 segment. Electronically Signed   By: Elon Alas M.D.   On: 03/14/2015 05:13   Dg Chest 2 View  03/13/2015  CLINICAL DATA:  82 year old presenting with acute mental status changes and somnolence. Right upper extremity weakness. Current history of hypertension. Personal history of breast cancer. EXAM: CHEST  2 VIEW COMPARISON:  11/19/2013 and earlier. FINDINGS: Cardiac silhouette mildly to moderately enlarged. Thoracic aorta tortuous and atherosclerotic, unchanged. Hilar and mediastinal contours otherwise unremarkable. Mild diffuse interstitial pulmonary edema. Small bilateral pleural effusions. The no confluent airspace  consolidation. Degenerative changes and DISH involving the thoracic spine. IMPRESSION: Mild CHF, with mild to moderate cardiomegaly, mild diffuse interstitial pulmonary edema and small bilateral pleural effusions. Electronically Signed   By: Evangeline Dakin M.D.   On: 03/13/2015 14:22   Ct Head Wo Contrast  03/13/2015  CLINICAL DATA:  Altered mental status. EXAM: CT HEAD WITHOUT CONTRAST TECHNIQUE: Contiguous axial images were obtained from the base of the skull through the vertex without intravenous contrast. COMPARISON:  12/18/2012 FINDINGS: Similar findings of advanced atrophy with sulcal prominence centralized volume loss with commensurate ex vacuo dilatation of the ventricular system. Extensive nearly confluent periventricular hypodensities are unchanged compatible microvascular ischemic disease. Old lacunar infarcts within the left side of the pons (image 9, series 3) as well as the bilateral insular cortices (image 16). Given extensive background parenchymal abnormalities, there is no CT evidence of superimposed acute large territory infarct. No intraparenchymal or extra-axial mass or hemorrhage. Unchanged size and configuration of the ventricles and basilar cisterns. No midline shift. Intracranial atherosclerosis. Limited visualization of the paranasal sinuses and mastoid air cells is normal. No air-fluid levels. Debris is noted within the bilateral external auditory canals. Regional soft tissues appear normal. No displaced calvarial fracture. IMPRESSION: Similar findings of advanced atrophy and microvascular ischemic disease without acute intracranial process. Electronically Signed   By: Sandi Mariscal M.D.   On: 03/13/2015 14:36   Ct Angio Neck W/cm &/or Wo/cm  03/14/2015  CLINICAL DATA:  Altered mental status, increased lethargy and memory problems for  2 days. History of atrial fibrillation on Coumadin, now discontinued due to frequent falls. Poor historian. Assess acute stroke. EXAM: CT ANGIOGRAPHY  HEAD AND NECK TECHNIQUE: Multidetector CT imaging of the head and neck was performed using the standard protocol during bolus administration of intravenous contrast. Multiplanar CT image reconstructions and MIPs were obtained to evaluate the vascular anatomy. Carotid stenosis measurements (when applicable) are obtained utilizing NASCET criteria, using the distal internal carotid diameter as the denominator. CONTRAST:  139mL OMNIPAQUE IOHEXOL 350 MG/ML SOLN COMPARISON:  MRI of the brain March 13, 2015 FINDINGS: CTA NECK Aortic arch: Normal appearance of the thoracic arch, 2 vessel arch is a normal variant. The origins of the innominate, left Common carotid artery and subclavian artery are widely patent. Right carotid system: Common carotid artery is widely patent, coursing in a straight line fashion. Normal appearance of the carotid bifurcation without hemodynamically significant stenosis by NASCET criteria. Minimal eccentric calcific atherosclerosis. Normal appearance of the included internal carotid artery. Left carotid system: Common carotid artery is widely patent, coursing in a straight line fashion. Normal appearance of the carotid bifurcation without hemodynamically significant stenosis by NASCET criteria. Minimal eccentric calcific atherosclerosis. Normal appearance of the included internal carotid artery. Vertebral arteries:Left vertebral artery is dominant. Normal appearance of the vertebral arteries, which appear widely patent. Skeleton: No acute osseous process though bone windows have not been submitted. Moderate to severe degenerative change of the cervical spine without destructive bony lesions. Multiple absent teeth. Other neck: Soft tissues of the neck are nonacute though, not tailored for evaluation. CTA HEAD Anterior circulation: Normal appearance of the cervical internal carotid arteries, petrous, cavernous and supra clinoid internal carotid arteries. Widely patent anterior communicating  artery. Patent anterior and middle cerebral arteries. Mild stenosis LEFT M2 segment. Posterior circulation: Normal appearance of the vertebral arteries, vertebrobasilar junction and basilar artery, as well as main branch vessels. Occluded distal LEFT P1 segment. Moderate stenosis RIGHT P2 segment. No dissection, luminal irregularity, contrast extravasation or aneurysm within the anterior nor posterior circulation. No abnormal intracranial enhancement. IMPRESSION: CTA NECK:  Negative. CTA HEAD:  Occluded distal LEFT P1 segment. Moderate stenosis RIGHT P2 segment. Electronically Signed   By: Elon Alas M.D.   On: 03/14/2015 05:13   Mr Brain Wo Contrast  03/13/2015  CLINICAL DATA:  TIA. Altered mental status and weakness for the past couple of days. EXAM: MRI HEAD WITHOUT CONTRAST TECHNIQUE: Multiplanar, multiecho pulse sequences of the brain and surrounding structures were obtained without intravenous contrast. COMPARISON:  Head CT 03/13/2015 FINDINGS: Some sequences are moderately motion degraded. There is abnormal diffusion-weighted signal involving the mesial left temporal lobe/hippocampus as well as lateral aspect of the left thalamus with evidence of mildly reduced ADC. There is at most minimal associated edema/ T2 hyperintensity. There is moderate cerebral atrophy. A few chronic cerebral microhemorrhages are noted. Confluent T2 hyperintensities in the cerebral white matter bilaterally are nonspecific but compatible with extensive chronic small vessel ischemic disease. Chronic lacunar infarcts are noted in the right thalamus and deep cerebral white matter bilaterally. No mass, midline shift, or extra-axial fluid collection is seen. Prior bilateral cataract extraction is noted. Paranasal sinuses and mastoid air cells are clear. Major intracranial vascular flow voids are grossly preserved, although evaluation is limited by motion artifact and the left PCA is not well seen. IMPRESSION: 1. Small acute/early  subacute infarcts in the left thalamus and mesial left temporal lobe. 2. Extensive chronic small vessel ischemic disease and cerebral atrophy. Electronically Signed   By:  Logan Bores M.D.   On: 03/13/2015 18:09    Assessment/Plan: Diagnosis: Left thalamic and left mesial temporal lobe infarct  Labs and images independently reviewed.  Records reviewed and summated above. Stroke: Continue secondary stroke prophylaxis and Risk Factor Modification listed below:   Antiplatelet therapy Blood Pressure Management:  Continue current medication with prn's with permisive HTN per primary team Statin Agent Diabetes management: Right sided hemiparesis: fit for orthotics to prevent contractures (resting hand splint for day, wrist cock up splint at night, etc);  Motor recovery: Fluoxetine  1. Does the need for close, 24 hr/day medical supervision in concert with the patient's rehab needs make it unreasonable for this patient to be served in a less intensive setting? Potentially 2. Co-Morbidities requiring supervision/potential complications: HTN (monitor and provide prns in accordance with increased physical exertion and pain), hyperlipidemia (cont meds), chronic back pain (Biofeedback training with therapies to help reduce reliance on opiate pain medications, monitor pain control during therapies, and sedation at rest and titrate to maximum efficacy to ensure participation and gains in therapies), legally blind, atrial fibrillation (monitor HR in accordance with increased activity, cont meds), CKD (avoid nephrotoxic meds) 3. Due to bladder management, bowel management, safety, skin/wound care, disease management, medication administration, pain management and patient education, does the patient require 24 hr/day rehab nursing? Yes 4. Does the patient require coordinated care of a physician, rehab nurse, PT (1-2 hrs/day, 5 days/week), OT (1-2 hrs/day, 5 days/week) and SLP (1-2 hrs/day, 5 days/week) to address  physical and functional deficits in the context of the above medical diagnosis(es)? Yes Addressing deficits in the following areas: balance, endurance, locomotion, strength, transferring, bathing, dressing, feeding, grooming, toileting, cognition, speech and psychosocial support 5. Can the patient actively participate in an intensive therapy program of at least 3 hrs of therapy per day at least 5 days per week? Yes 6. The potential for patient to make measurable gains while on inpatient rehab is good 7. Anticipated functional outcomes upon discharge from inpatient rehab are min assist  with PT, min assist with OT, min assist and mod assist with SLP. 8. Estimated rehab length of stay to reach the above functional goals is: 15-18 days. 9. Does the patient have adequate social supports and living environment to accommodate these discharge functional goals? Yes 10. Anticipated D/C setting: Home 11. Anticipated post D/C treatments: HH therapy and Home excercise program 12. Overall Rehab/Functional Prognosis: good and fair  RECOMMENDATIONS: This patient's condition is appropriate for continued rehabilitative care in the following setting: Patient baseline level of functioning is unclear. She may be at baseline as she was not able to perform any ADLs without assistance prior to admission and received assisstance from her from her daughter as well as an assistance.   Will have to clarify baseline level of functioning, in addition to awaiting completion of medical workup (EEG).  If she does have a significant increase in deficits or is at her baseline, pt would likely be most appropriate for SNF.    Patient has agreed to participate in recommended program. Potentially Note that insurance prior authorization may be required for reimbursement for recommended care.  Comment: Rehab Admissions Coordinator to follow up.  Delice Lesch, MD 03/14/2015

## 2015-03-14 NOTE — Progress Notes (Signed)
Rehab Admissions Coordinator Note:  Patient was screened by Retta Diones for appropriateness for an Inpatient Acute Rehab Consult.  At this time, we are recommending Inpatient Rehab consult.  Retta Diones 03/14/2015, 12:24 PM  I can be reached at [phone].

## 2015-03-14 NOTE — Progress Notes (Signed)
EEG completed; results pending.    

## 2015-03-14 NOTE — Evaluation (Signed)
Physical Therapy Evaluation Patient Details Name: Rose Sullivan MRN: [id number] DOB: [birth year] Today's Date: 03/14/2015   History of Present Illness  Pt adm with lethargy and altered mental status. 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 PMH - legally blind, DM, HTN, a-fib  Clinical Impression  Pt admitted with above diagnosis and presents to PT with functional limitations due to deficits listed below (See PT problem list). Pt needs skilled PT to maximize independence and safety to allow discharge to CIR. Pt with significant deficits from stroke and unable to return home without further therapy.     Follow Up Recommendations CIR    Equipment Recommendations  None recommended by PT    Recommendations for Other Services Rehab consult     Precautions / Restrictions Precautions Precautions: Fall Restrictions Weight Bearing Restrictions: No      Mobility  Bed Mobility Overal bed mobility: Needs Assistance Bed Mobility: Sidelying to Sit   Sidelying to sit: Mod assist       General bed mobility comments: Assist to elevate trunk and bring hips to EOB. Pt with decr control of RUE  Transfers Overall transfer level: Needs assistance Equipment used: Rolling walker (2 wheeled);Ambulation equipment used Transfers: Sit to/from [facility] Sit to Stand: +2 physical assistance;Mod assist Stand pivot transfers: +2 physical assistance;Mod assist (with Stedy)       General transfer comment: Assist to bring hips up and control RUE and for balance. Pt with significant apraxia and ataxia. Pt unable to keep RUE anywhere static. Pt also with difficulty placing RLE. Stood with walker on first attempt but didn't attempt pivot due to lack of control of RUE and RLE. Used Stedy for transfer to chair then to bsc then back to chair.  Ambulation/Gait                Stairs            Wheelchair Mobility    Modified Rankin (Stroke Patients  Only) Modified Rankin (Stroke Patients Only) Pre-Morbid Rankin Score: Moderate disability Modified Rankin: Severe disability     Balance Overall balance assessment: Needs assistance Sitting-balance support: Feet supported;Single extremity supported Sitting balance-Leahy Scale: Poor Sitting balance - Comments: Pt with RUE frequently moving and not providing any support. Postural control: Other (comment);Left lateral lean (Anterior lean) Standing balance support: Bilateral upper extremity supported Standing balance-Leahy Scale: Poor Standing balance comment: Stood with +2 assist with walker or Stedy                             Pertinent Vitals/Pain Pain Assessment: No/denies pain    Home Living Family/patient expects to be discharged to:: Private residence Living Arrangements: Children Available Help at Discharge: Family;Personal care attendant Type of Home: House Home Access: Ramped entrance     Home Layout: One level Home Equipment: Environmental consultant - 2 wheels;Wheelchair - manual      Prior Function Level of Independence: Needs assistance   Gait / Transfers Assistance Needed: Only amb household distances with  walker           Hand Dominance   Dominant Hand: Right    Extremity/Trunk Assessment   Upper Extremity Assessment: Defer to OT evaluation           Lower Extremity Assessment: RLE deficits/detail RLE Deficits / Details: Grossly 3/5 but with ataxia and apraxia    Cervical / Trunk Assessment: Kyphotic  Communication   Communication: No difficulties  Cognition Arousal/Alertness:  Awake/alert Behavior During Therapy: Anxious Overall Cognitive Status: Impaired/Different from baseline Area of Impairment: Awareness;Attention;Following commands;Safety/judgement;Problem solving   Current Attention Level: Sustained Memory: Decreased short-term memory Following Commands: Follows one step commands consistently;Follows one step commands with increased  time Safety/Judgement: Decreased awareness of safety;Decreased awareness of deficits   Problem Solving: Difficulty sequencing;Requires verbal cues;Requires tactile cues;Slow processing      General Comments      Exercises        Assessment/Plan    PT Assessment Patient needs continued PT services  PT Diagnosis Difficulty walking;Abnormality of gait;Hemiplegia dominant side   PT Problem List Decreased strength;Decreased activity tolerance;Decreased balance;Decreased mobility;Decreased coordination;Decreased cognition;Decreased knowledge of use of DME;Decreased safety awareness;Obesity  PT Treatment Interventions DME instruction;Gait training;Functional mobility training;Therapeutic activities;Therapeutic exercise;Balance training;Neuromuscular re-education;Cognitive remediation;Patient/family education   PT Goals (Current goals can be found in the Care Plan section) Acute Rehab PT Goals Patient Stated Goal: Return home PT Goal Formulation: With patient Time For Goal Achievement: 03/28/15 Potential to Achieve Goals: Good    Frequency Min 4X/week   Barriers to discharge        Co-evaluation               End of Session Equipment Utilized During Treatment: Gait belt Activity Tolerance: Patient tolerated treatment well Patient left: in chair;with call bell/phone within reach;with chair alarm set Nurse Communication: Mobility status;Need for lift equipment    Functional Assessment Tool Used: clinical judgement Functional Limitation: Mobility: Walking and moving around Mobility: Walking and Moving Around Current Status [phone]): At least 80 percent but less than 100 percent impaired, limited or restricted Mobility: Walking and Moving Around Goal Status [phone]): At least 20 percent but less than 40 percent impaired, limited or restricted    Time: [id number] PT Time Calculation (min) (ACUTE ONLY): 36 min   Charges:   PT Evaluation $PT Eval Moderate Complexity: 1  Procedure PT Treatments $Therapeutic Activity: 8-22 mins   PT G Codes:   PT G-Codes **NOT FOR INPATIENT CLASS** Functional Assessment Tool Used: clinical judgement Functional Limitation: Mobility: Walking and moving around Mobility: Walking and Moving Around Current Status [id number]): At least 80 percent but less than 100 percent impaired, limited or restricted Mobility: Walking and Moving Around Goal Status [phone]): At least 20 percent but less than 40 percent impaired, limited or restricted    [facility] 03/14/2015, 12:16 PM [place]

## 2015-03-14 NOTE — Procedures (Signed)
TECHNICAL SUMMARY:  A multichannel referential and bipolar montage EEG using the standard international 10-20 system was performed on the patient described as awake and confused.  The dominant background activity consists of 9-10 hertz activity seen most prominantly over the posterior head region.  The backgound activity is reactive to eye opening and closing procedures.  Low voltage fast (beta) activity is distributed symmetrically and maximally over the anterior head regions.  ACTIVATION:  Stepwise photic stimulation and hyperventilation are not performed.  EPILEPTIFORM ACTIVITY:  There were no spikes, sharp waves or paroxysmal activity.  SLEEP:  none    IMPRESSION:  This is a normal EEG for the patients stated age.  There were no focal, hemispheric or lateralizing features.  No epileptiform activity was recorded.  A normal EEG does not exclude the diagnosis of a seizure disorder and if seizure remains high on the list of differential diagnosis, an ambulatory EEG may be of value.  Clinical correlation is required.

## 2015-03-14 NOTE — Progress Notes (Signed)
Pt BP has been running high, last BP 191/103 md on call [place] was paged about pt BP, call me back on phone said she is not giving anything for high blood pressure because her MRI show stroke but want me to call her if the SBP reached 200, will continue to monitor

## 2015-03-15 DIAGNOSIS — I4891 Unspecified atrial fibrillation: Secondary | ICD-10-CM

## 2015-03-15 LAB — GLUCOSE, CAPILLARY
Glucose-Capillary: 121 mg/dL — ABNORMAL HIGH (ref 65–99)
Glucose-Capillary: 125 mg/dL — ABNORMAL HIGH (ref 65–99)
Glucose-Capillary: 148 mg/dL — ABNORMAL HIGH (ref 65–99)
Glucose-Capillary: 96 mg/dL (ref 65–99)

## 2015-03-15 LAB — BASIC METABOLIC PANEL
Anion gap: 14 (ref 5–15)
BUN: 11 mg/dL (ref 6–20)
CHLORIDE: 107 mmol/L (ref 101–111)
CO2: 20 mmol/L — AB (ref 22–32)
CREATININE: 0.9 mg/dL (ref 0.44–1.00)
Calcium: 8.9 mg/dL (ref 8.9–10.3)
GFR calc non Af Amer: 59 mL/min — ABNORMAL LOW (ref >60)
Glucose, Bld: 139 mg/dL — ABNORMAL HIGH (ref 65–99)
Potassium: 3.4 mmol/L — ABNORMAL LOW (ref 3.5–5.1)
Sodium: 141 mmol/L (ref 135–145)

## 2015-03-15 LAB — CBC
HEMATOCRIT: 44.6 % (ref 36.0–46.0)
HEMOGLOBIN: 15.2 g/dL — AB (ref 12.0–15.0)
MCH: 30.9 pg (ref 26.0–34.0)
MCHC: 34.1 g/dL (ref 30.0–36.0)
MCV: 90.7 fL (ref 78.0–100.0)
PLATELETS: 302 10*3/uL (ref 150–400)
RBC: 4.92 MIL/uL (ref 3.87–5.11)
RDW: 12.5 % (ref 11.5–15.5)
WBC: 10.6 10*3/uL — ABNORMAL HIGH (ref 4.0–10.5)

## 2015-03-15 LAB — HEMOGLOBIN A1C
Hgb A1c MFr Bld: 5.7 % — ABNORMAL HIGH (ref 4.8–5.6)
MEAN PLASMA GLUCOSE: 117 mg/dL

## 2015-03-15 MED ORDER — METOPROLOL TARTRATE 1 MG/ML IV SOLN
5.0000 mg | INTRAVENOUS | Status: DC | PRN
  Administered 2015-03-15: 5 mg via INTRAVENOUS
  Filled 2015-03-15: qty 5

## 2015-03-15 MED ORDER — PNEUMOCOCCAL VAC POLYVALENT 25 MCG/0.5ML IJ INJ
0.5000 mL | INJECTION | INTRAMUSCULAR | Status: DC
  Filled 2015-03-15: qty 0.5

## 2015-03-15 MED ORDER — INFLUENZA VAC SPLIT QUAD 0.5 ML IM SUSY: 0.5000 mL | PREFILLED_SYRINGE | INTRAMUSCULAR | Status: DC

## 2015-03-15 MED ORDER — METOPROLOL TARTRATE 12.5 MG HALF TABLET
12.5000 mg | ORAL_TABLET | Freq: Two times a day (BID) | ORAL | Status: DC
  Administered 2015-03-15 – 2015-03-16 (×3): 12.5 mg via ORAL
  Filled 2015-03-15 (×3): qty 1

## 2015-03-15 MED ORDER — APIXABAN 5 MG PO TABS
5.0000 mg | ORAL_TABLET | Freq: Two times a day (BID) | ORAL | Status: DC
  Administered 2015-03-15 – 2015-03-16 (×3): 5 mg via ORAL
  Filled 2015-03-15 (×3): qty 1

## 2015-03-15 MED ORDER — DILTIAZEM HCL 25 MG/5ML IV SOLN
10.0000 mg | Freq: Once | INTRAVENOUS | Status: AC
  Administered 2015-03-15: 10 mg via INTRAVENOUS
  Filled 2015-03-15: qty 5

## 2015-03-15 NOTE — Progress Notes (Signed)
Physical Therapy Treatment Patient Details Name: Rose Sullivan MRN: [id number] DOB: [birth year] Today's Date: 03/15/2015    History of Present Illness Pt adm with lethargy and altered mental status. 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 PMH - legally blind (since birth per pt), DM, HTN, a-fib    PT Comments    Pt performed sit to stand from bed to chair with use of stedy for safety.  Pt required mod +2 to boost hips from seated surface into standing for placement of stedy.  Pt easily distracted requiring increased time and max VCs/tactile cues for correct technique.    Follow Up Recommendations  CIR     Equipment Recommendations  None recommended by PT    Recommendations for Other Services Rehab consult     Precautions / Restrictions Precautions Precautions: Fall Restrictions Weight Bearing Restrictions: No    Mobility  Bed Mobility Overal bed mobility: Needs Assistance Bed Mobility: Rolling;Supine to Sit Rolling: Min assist;Mod assist (required min for rolling R and mod assist for rolling L.  Pt performed multiple bouts of rolling for perianal care post found in urine from incontinence.  Pt reports she is unaware of incontinence.  ) Sidelying to sit: Mod assist       General bed mobility comments: Pt required cues for hand placement and trunk control to improve technique and ease during transitions.  Pt required min assist to maintain sitting balance edge of bed with noticeable LOB forward.    Transfers Overall transfer level: Needs assistance Equipment used: 1 person hand held assist Transfers: Sit to/from Stand (required sit to stand from edge of bed to stedy and stedy to standing for transfer to chair.  ) Sit to Stand: Mod assist;+2 physical assistance Stand pivot transfers: +2 physical assistance;Mod assist       General transfer comment: Pt remains to require assist to boost hips from sitting to 3/4 standing trial to place stedy for stand  pivot from bed to chair.  Pt required max VCs with tactile cues for hand placement to ascend from seated surface x 2 bouts.  Pt tolerated transfer well and presents with improved strength in R UE.  Pt remains to require assist for R foot placement onto foot plate of stedy.    Ambulation/Gait Ambulation/Gait assistance:  (remains unsafe at this time.  )               Stairs            Wheelchair Mobility    Modified Rankin (Stroke Patients Only)       Balance Overall balance assessment: Needs assistance Sitting-balance support: Feet supported;Single extremity supported Sitting balance-Leahy Scale: Poor Sitting balance - Comments: Pt requires one UE A to stay foreward stting in recliner Postural control: Posterior lean Standing balance support: Bilateral upper extremity supported Standing balance-Leahy Scale: Zero                      Cognition Arousal/Alertness: Awake/alert Behavior During Therapy: Anxious Overall Cognitive Status: Impaired/Different from baseline Area of Impairment: Safety/judgement;Problem solving;Following commands;Attention   Current Attention Level: Sustained Memory: Decreased short-term memory Following Commands: Follows one step commands with increased time Safety/Judgement: Decreased awareness of safety;Decreased awareness of deficits   Problem Solving: Slow processing;Decreased initiation;Difficulty sequencing;Requires verbal cues;Requires tactile cues      Exercises      General Comments        Pertinent Vitals/Pain Pain Assessment: No/denies pain    Home Living Family/patient  expects to be discharged to:: Private residence Living Arrangements: Children Available Help at Discharge: Family (not sure if 24/7) Type of Home: House Home Access: [place]: One [place]: [place] - 2 wheels;Wheelchair - manual      Prior Function Level of Independence: Needs assistance  Gait / Transfers  Assistance Needed: Only amb household distances with  walker ADL's / Homemaking Assistance Needed: Pt reports she could B/D herself but daughter helps her sometimes     PT Goals (current goals can now be found in the care plan section) Acute Rehab PT Goals Patient Stated Goal: To be able to go home and take care of myself Potential to Achieve Goals: Good Progress towards PT goals: Progressing toward goals    Frequency  Min 4X/week    PT Plan      Co-evaluation             End of Session Equipment Utilized During Treatment: Gait belt Activity Tolerance: Patient tolerated treatment well Patient left: in chair;with call bell/phone within reach;with chair alarm set     Time: 1014-1040 PT Time Calculation (min) (ACUTE ONLY): 26 min  Charges:  $Therapeutic Activity: 23-37 mins                    G Codes:      Cristela Blue Apr 11, 2015, 12:54 PM [place], PTA pager [phone]

## 2015-03-15 NOTE — Evaluation (Signed)
Speech Language Pathology Evaluation Patient Details Name: Rose Sullivan MRN: [id number] DOB: 1933/04/14 Today's Date: 03/15/2015 Time: [id number] SLP Time Calculation (min) (ACUTE ONLY): 34 min  Problem List:  Patient Active Problem List   Diagnosis Date Noted  . Atrial fibrillation with RVR ([place]) 03/15/2015  . Acute CVA (cerebrovascular accident) ([place]) 03/14/2015  . CKD (chronic kidney disease)   . Altered mental status   . Acute encephalopathy 03/13/2015  . DM (diabetes mellitus) ([place]) 03/13/2015  . Cerebral embolism with cerebral infarction 03/13/2015  . Neck pain 02/09/2014  . Paresthesia of arm 02/09/2014  . Lumbar facet arthropathy 03/09/2013  . Unspecified hereditary and idiopathic peripheral neuropathy 03/09/2013  . Osteoarthritis of right hip 03/09/2013  . Osteoarthritis of right knee 03/09/2013  . Osteoarthritis of left knee 03/09/2013  . Anxiety state, unspecified 02/04/2013  . Chronic anticoagulation 11/04/2012  . Allergic rhinitis, cause unspecified 07/23/2012  . Bilateral hearing loss 06/27/2011  . Insomnia 06/27/2011  . Breast cancer metastasized to lung ([place]) 06/05/2011  . Preventative health care 01/21/2011  . Cervical radicular pain 01/09/2011  . Lumbar radicular pain 01/09/2011  . Vitamin D deficiency 08/31/2010  . Bilateral knee pain 08/31/2010  . Impaired glucose tolerance 05/18/2010  . Hypersomnia 05/18/2010  . Nocturia 05/18/2010  . Other specified forms of hearing loss 09/05/2009  . DYSPNEA 03/18/2009  . Atrial fibrillation ([place]) 03/07/2009  . FATIGUE 12/10/2007  . GAIT DISTURBANCE 12/10/2007  . Hyperlipidemia 02/11/2007  . Essential hypertension 02/11/2007  . GERD 02/11/2007  . OSTEOPENIA 02/11/2007  . BLINDNESS, [place], [place] DEFINITION 11/25/2006  . LOW BACK PAIN, CHRONIC 11/25/2006   Past Medical History:  Past Medical History  Diagnosis Date  . GLUCOSE INTOLERANCE 02/11/2007  . HYPERLIPIDEMIA 02/11/2007  . BLINDNESS, [place], [place]  DEFINITION 11/25/2006  . Other specified forms of hearing loss 09/05/2009  . HYPERTENSION 02/11/2007  . Atrial fibrillation ([place]) 03/07/2009  . GERD 02/11/2007  . LOW BACK PAIN, CHRONIC 11/25/2006  . BACK PAIN 12/10/2007  . Pain in Soft Tissues of Limb 02/11/2007  . OSTEOPENIA 02/11/2007  . FATIGUE 12/10/2007  . GAIT DISTURBANCE 12/10/2007  . DYSPNEA 03/18/2009  . Cough 03/07/2009  . CHEST PAIN 12/10/2007  . Abdominal pain, left lower quadrant 04/01/2007  . BREAST CANCER, HX OF 11/25/2006  . Nocturia 05/18/2010  . Hypersomnia 05/18/2010  . Impaired glucose tolerance 05/18/2010  . Allergic rhinitis, cause unspecified 07/23/2012  . Anxiety state, unspecified 02/04/2013  . Cancer [facility])    Past Surgical History:  Past Surgical History  Procedure Laterality Date  . Dilation and curettage of uterus    . S/p mastectomy    . Appendectomy     HPI:  Pt admitted from home with lethargy and altered mental status. 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 Past medical history significant for: legally blind, DM, HTN, a-fib.   Assessment / Plan / Recommendation Clinical Impression  Cognitive-linguistic evaluation complete.  Patient with baseline memory impairments; however, more impaired when compared to baseline level of functioning.  Patient was not able to state exact date PTA, but was aware of current events and could identify the date if given choices.  Additionally, patient was able to problem solve basic ADLs and now requires assist.  During conversational exchanges patient demonstrates anomia as well as verbal perseveration.  As a result, skilled SLP services are recommended while on acute care and post acute SLP services are also recommended to reduce burden of care and maximize functional independence prior to return home  with 24/7 assist from family.         SLP Assessment  Patient needs continued Speech Lanaguage Pathology Services    Follow Up Recommendations  24 hour  supervision/assistance;Inpatient Rehab    Frequency and Duration min 2x/week  1 week      SLP Evaluation Prior Functioning  Cognitive/Linguistic Baseline: Baseline deficits Baseline deficit details: in memory not able to recall exact date or year at basline, but could keep up with an estimate of the time and current events  Type of Home: House  Lives With: Family;Daughter Available Help at Discharge: Family;Available 24 hours/day   Cognition  Overall Cognitive Status: Impaired/Different from baseline Arousal/Alertness: Awake/alert Orientation Level: Oriented to person;Disoriented to place;Disoriented to time;Disoriented to situation Attention: Sustained Sustained Attention: Impaired Sustained Attention Impairment: Verbal basic;Functional basic Memory: Impaired Memory Impairment: Storage deficit;Retrieval deficit;Decreased recall of new information Awareness: Impaired Awareness Impairment: Emergent impairment Problem Solving: Impaired Problem Solving Impairment: Verbal basic;Functional basic Executive Function:  (all impaired due to lower level deficits ) Safety/Judgment: Impaired    Comprehension  Auditory Comprehension Overall Auditory Comprehension: Appears within functional limits for tasks assessed    Expression Expression Primary Mode of Expression: Verbal Verbal Expression Overall Verbal Expression: Impaired Naming: Impairment Verbal Errors: Perseveration;Not aware of errors Other Verbal Expression Comments: anomia in conversation    Oral / Motor  Oral Motor/Sensory Function Overall Oral Motor/Sensory Function: Within functional limits Motor Speech Overall Motor Speech: Appears within functional limits for tasks assessed   GO                   Carmelia Roller., CCC-SLP [id number]  Aleisha Paone 03/15/2015, 4:26 PM

## 2015-03-15 NOTE — Progress Notes (Signed)
Notified on call provider about patient's BP being elevated. Awaiting page

## 2015-03-15 NOTE — Progress Notes (Signed)
Rehab admissions - I spoke with patient, her daughters and with Dr. Naaman Plummer, rehab MD. Daughter Rose Sullivan does not feel like patient is at her baseline.  Dtr would like rehab prior to home.  Dtr does provide supervision at home.  I spoke with attending MD who says patient had AFIB today and not ready for CIR today.  I will check back tomorrow to see if patient is medically ready.  Call me for questions.  [phone]

## 2015-03-15 NOTE — Care Management Note (Signed)
Case Management Note  Patient Details  Name: Rose Sullivan MRN: [id number] Date of Birth: 14-Nov-1933  Subjective/Objective:    Admitted with  AMS. CVA. 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 PMH - legally blind, DM, HTN, a-fib. From home with daughter, [name]. Daughter states active with [facility] for HHPT, 2X per week pta. Pt required help with ADL's 2/2 to weakness in rue and le per daughter.Uses walker with ambulation PTA.    Action/Plan: MRI positive- CVA.  Neurology consulted.  carotid doppler ordered and further workup per neurology.   Expected Discharge Date:                  Expected Discharge Plan:  [place] vs SNF  In-House Referral:     Discharge planning Services  CM Consult  Post Acute Care Choice:    Choice offered to:     DME Arranged:    DME Agency:     HH Arranged:    HH Agency:     Status of Service:  In process, will continue to follow  Medicare Important Message Given:    Date Medicare IM Given:    Medicare IM give by:    Date Additional Medicare IM Given:    Additional Medicare Important Message give by:     If discussed at [place] of Stay Meetings, dates discussed:    Additional Comments:  CM called daughter [name]) to discuss d/c planning. Call was unsuccessful, voice message for daughter to call CM   Breale Wilkens (Daughter),[phone],Dorothy Goins (Daughter) [phone]   Whitman Hero [place], [place] [phone] 03/15/2015, 11:51 AM

## 2015-03-15 NOTE — Progress Notes (Signed)
Provider on floor

## 2015-03-15 NOTE — Evaluation (Signed)
Occupational Therapy Evaluation Patient Details Name: Rose Sullivan MRN: [id number] DOB: May 21, 1933 Today's Date: 03/15/2015    History of Present Illness Pt adm with lethargy and altered mental status. 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 PMH - legally blind (since birth per pt), DM, HTN, a-fib   Clinical Impression   This 82 yo female admitted with above presents to acute OT with deficits below affecting her PLOF of what she reports as sometimes A with basic ADLs. She will benefit from acute OT with follow up OT on CIR to get back to PLOF and decrease burden of care on daughter.    Follow Up Recommendations  CIR    Equipment Recommendations   (TBD at next venue)       Precautions / Restrictions Precautions Precautions: Fall Restrictions Weight Bearing Restrictions: No      Mobility Bed Mobility               General bed mobility comments: Pt up in recliner upon arrival  Transfers   Equipment used: 1 person hand held assist Transfers: Sit to/from Stand Sit to Stand: Max assist              Balance Overall balance assessment: Needs assistance Sitting-balance support: Feet supported;Single extremity supported Sitting balance-Leahy Scale: Poor Sitting balance - Comments: Pt requires one UE A to stay foreward stting in recliner Postural control: Posterior lean Standing balance support: Bilateral upper extremity supported Standing balance-Leahy Scale: Zero                              ADL Overall ADL's : Needs assistance/impaired Eating/Feeding: Minimal assistance;Sitting (due to legally blind and ataxia of UEs)   Grooming: Oral care;Brushing hair (supported sitting) Grooming Details (indicate cue type and reason): Max A for brushing teeth (could not open toothpaste, nor apply toothpaste to toothbrush) difficulty with hand to mouth (due to ataxia)-once toothbrush in mouth she could brush teeth, but could not manage cup to  spit and and wipe mouth. Combing hair (Mod A) difficulty due to apraxia Upper Body Bathing: Moderate assistance (supported sitting)   Lower Body Bathing: Total assistance (Max A sit<>stand)   Upper Body Dressing : Total assistance (supported sitting)   Lower Body Dressing: Total assistance (Max A sit<>stand)                       Vision Additional Comments: Pt reports she should wear glasses all the time, but does not. Her left eye turns out to far left at rest (pt cannot tell me if this is normal for her), no double vision. Pt can track in all directions with both eyes but left eye will not track full  medially. She is able to find cup in all quadrants with difficulty in right upper and lower quadrants          Pertinent Vitals/Pain Pain Assessment: No/denies pain     Hand Dominance Right   Extremity/Trunk Assessment Upper Extremity Assessment Upper Extremity Assessment: RUE deficits/detail;LUE deficits/detail RUE Deficits / Details: decreased coordination; ataxia RUE Sensation: decreased proprioception RUE Coordination: decreased fine motor;decreased gross motor LUE Deficits / Details: decreased coordination (but not as bad as RUE), ataxia LUE Sensation: decreased proprioception LUE Coordination: decreased gross motor;decreased fine motor           Communication Communication Communication: Expressive difficulties   Cognition Arousal/Alertness: Awake/alert Behavior During Therapy: Anxious Overall Cognitive Status:  Impaired/Different from baseline Area of Impairment: Safety/judgement;Problem solving;Following commands;Attention   Current Attention Level: Sustained   Following Commands: Follows one step commands with increased time Safety/Judgement: Decreased awareness of safety;Decreased awareness of deficits   Problem Solving: Slow processing;Decreased initiation;Difficulty sequencing;Requires verbal cues;Requires tactile cues                Home  Living Family/patient expects to be discharged to:: Private residence Living Arrangements: Children Available Help at Discharge: Family (not sure if 24/7) Type of Home: House Home Access: Ramped entrance     Home Layout: One level     Bathroom Shower/Tub: Walk-in shower;Curtain         Home Equipment: Environmental consultant - 2 wheels;Wheelchair - manual          Prior Functioning/Environment Level of Independence: Needs assistance  Gait / Transfers Assistance Needed: Only amb household distances with  walker ADL's / Homemaking Assistance Needed: Pt reports she could B/D herself but daughter helps her sometimes        OT Diagnosis: Generalized weakness;Cognitive deficits;Disturbance of vision;Hemiplegia non-dominant side;Hemiplegia dominant side;Ataxia   OT Problem List: Decreased strength;Decreased range of motion;Impaired balance (sitting and/or standing);Decreased safety awareness;Impaired vision/perception;Decreased coordination;Decreased cognition;Decreased activity tolerance;Decreased knowledge of use of DME or AE;Obesity;Impaired UE functional use   OT Treatment/Interventions: Self-care/ADL training;Visual/perceptual remediation/compensation;Patient/family education;Balance training;Therapeutic exercise;Therapeutic activities;DME and/or AE instruction;Neuromuscular education    OT Goals(Current goals can be found in the care plan section) Acute Rehab OT Goals Patient Stated Goal: To be able to go home and take care of myself OT Goal Formulation: With patient Time For Goal Achievement: 03/29/15 Potential to Achieve Goals: Good  OT Frequency: Min 3X/week              End of Session Equipment Utilized During Treatment: Gait belt  Activity Tolerance: Patient limited by fatigue Patient left: in chair;with call bell/phone within reach;with chair alarm set (pt able to find and touch red call button x2 at end of session)   Time: [id number] OT Time Calculation (min): 27 min Charges:   OT General Charges $OT Visit: 1 Procedure OT Evaluation $OT Eval Moderate Complexity: 1 Procedure OT Treatments $Self Care/Home Management : 8-22 mins  Almon Register [id number] 03/15/2015, 12:37 PM

## 2015-03-15 NOTE — H&P (Signed)
Physical Medicine and Rehabilitation Admission H&P    Chief Complaint  Patient presents with  . Altered Mental Status  . Weakness  : HPI: Rose Sullivan is a 82 y.o. right handed female with history of hypertension, hyperlipidemia, chronic back pain followed at the pain clinic by Dr.Swartz, legally blind and atrial fibrillation. By report patient lives with children as well as a personal care attendant who provide assistance as needed. Patient states that she needed help with all ADLs prior to admission. Noted baseline memory impairments. One level home with ramped entrance. Ambulated household distances with a walker. Presented 03/13/2015 with altered mental status, increasing lethargy and progressive weakness over the previous couple of days as well as memory problems. MRI of the brain showed small acute or early subacute infarct left thalamus and mesial left temporal lobe. Extensive chronic small vessel disease. Patient did not receive TPA. CTA of neck negative. CTA of the head showed occluded distal left P1 segment. Echocardiogram with ejection fraction of 60% no wall motion abnormalities. EEG was negative. Blood pressure heart rate monitored with heart rate 130s-140s on occasion and asymptomatic as patient remains on Cardizem and Lopressor was added. Neurology consulted maintained on Eliquis for CVA prophylaxis with history of atrial fibrillation. Regular diet consistency. Physical therapy evaluation completed with recommendations of physical medicine rehabilitation consult.  ROS Review of Systems  Constitutional: Positive for malaise/fatigue. Negative for fever and chills.  HENT: Positive for hearing loss.  Eyes:   Legally blind. She can see figures and objects  Respiratory: Negative for cough and shortness of breath.  Cardiovascular: Negative for chest pain.  Gastrointestinal: Positive for constipation. Negative for nausea and vomiting.   GERD  Musculoskeletal:  Positive for back pain and falls.  Neurological: Positive for weakness. Negative for seizures and headaches.  Psychiatric/Behavioral:   Anxiety , memory loss, insomnia All other systems reviewed and are negative  Past Medical History  Diagnosis Date  . GLUCOSE INTOLERANCE 02/11/2007  . HYPERLIPIDEMIA 02/11/2007  . BLINDNESS, [place], [place] DEFINITION 11/25/2006  . Other specified forms of hearing loss 09/05/2009  . HYPERTENSION 02/11/2007  . Atrial fibrillation ([place]) 03/07/2009  . GERD 02/11/2007  . LOW BACK PAIN, CHRONIC 11/25/2006  . BACK PAIN 12/10/2007  . Pain in Soft Tissues of Limb 02/11/2007  . OSTEOPENIA 02/11/2007  . FATIGUE 12/10/2007  . GAIT DISTURBANCE 12/10/2007  . DYSPNEA 03/18/2009  . Cough 03/07/2009  . CHEST PAIN 12/10/2007  . Abdominal pain, left lower quadrant 04/01/2007  . BREAST CANCER, HX OF 11/25/2006  . Nocturia 05/18/2010  . Hypersomnia 05/18/2010  . Impaired glucose tolerance 05/18/2010  . Allergic rhinitis, cause unspecified 07/23/2012  . Anxiety state, unspecified 02/04/2013  . Cancer [facility])    Past Surgical History  Procedure Laterality Date  . Dilation and curettage of uterus    . S/p mastectomy    . Appendectomy     Family History  Problem Relation Age of Onset  . Hypertension Brother   . Lung cancer Brother   . Colon cancer Father   . COPD Brother   . Stroke Mother    Social History:  reports that she has quit smoking. She has never used smokeless tobacco. She reports that she does not drink alcohol or use illicit drugs. Allergies:  Allergies  Allergen Reactions  . Trazodone And Nefazodone Other (See Comments)    hallucinations   Medications Prior to Admission  Medication Sig Dispense Refill  . diclofenac sodium (VOLTAREN) 1 % GEL Apply 1  application topically 4 (four) times daily. Apply to both knees and hands 3 Tube 4  . diltiazem (CARDIZEM CD) 240 MG 24 hr capsule TAKE ONE CAPSULE BY MOUTH EVERY DAY 30 capsule 6  . exemestane (AROMASIN) 25 MG  tablet Take 1 tablet (25 mg total) by mouth daily. 90 tablet 3  . gabapentin (NEURONTIN) 100 MG capsule Take 100 mg by mouth 3 (three) times daily.    Marland Kitchen glucose blood (ONE TOUCH ULTRA TEST) test strip Use as instructed once daily to check blood sugar.  ICD 10 code R73.02 100 each 11  . Lancets (ONETOUCH ULTRASOFT) lancets Use as instructed once daily to check blood sugar.  ICD 10 code R73.02 100 each 11  . losartan (COZAAR) 50 MG tablet Take 50 mg by mouth daily.    Marland Kitchen oxyCODONE-acetaminophen (PERCOCET) 7.5-325 MG tablet Take 1 tablet by mouth every 8 (eight) hours as needed. 120 tablet 0  . temazepam (RESTORIL) 30 MG capsule TAKE ONE CAPSULE BY MOUTH AT BEDTIME AS NEEDED FOR SLEEP 90 capsule 1    Home: Home Living Family/patient expects to be discharged to:: Private residence Living Arrangements: Children Available Help at Discharge: Family, Personal care attendant Type of Home: House Home Access: Ramped entrance Home Layout: One level Home Equipment: Environmental consultant - 2 wheels, Wheelchair - manual   Functional History: Prior Function Level of Independence: Needs assistance Gait / Transfers Assistance Needed: Only amb household distances with  walker  Functional Status:  Mobility: Bed Mobility Overal bed mobility: Needs Assistance Bed Mobility: Sidelying to Sit Sidelying to sit: Mod assist General bed mobility comments: Assist to elevate trunk and bring hips to EOB. Pt with decr control of RUE Transfers Overall transfer level: Needs assistance Equipment used: Rolling walker (2 wheeled), Ambulation equipment used Transfer via Lift Equipment: Stedy Transfers: Sit to/from Stand, Risk manager Sit to Stand: +2 physical assistance, Mod assist Stand pivot transfers: +2 physical assistance, Mod assist (with Stedy) General transfer comment: Assist to bring hips up and control RUE and for balance. Pt with significant apraxia and ataxia. Pt unable to keep RUE anywhere static. Pt also with  difficulty placing RLE. Stood with walker on first attempt but didn't attempt pivot due to lack of control of RUE and RLE. Used Stedy for transfer to chair then to bsc then back to chair.      ADL:    Cognition: Cognition Overall Cognitive Status: Impaired/Different from baseline Orientation Level: Oriented to person, Disoriented to time, Disoriented to place, Disoriented to person Cognition Arousal/Alertness: Awake/alert Behavior During Therapy: Anxious Overall Cognitive Status: Impaired/Different from baseline Area of Impairment: Awareness, Attention, Following commands, Safety/judgement, Problem solving Current Attention Level: Sustained Memory: Decreased short-term memory Following Commands: Follows one step commands consistently, Follows one step commands with increased time Safety/Judgement: Decreased awareness of safety, Decreased awareness of deficits Problem Solving: Difficulty sequencing, Requires verbal cues, Requires tactile cues, Slow processing  Physical Exam: Blood pressure 152/89, pulse 75, temperature 98 F (36.7 C), temperature source Oral, resp. rate 20, height 5' (1.524 m), weight 86.864 kg (191 lb 8 oz), SpO2 99 %. Physical Exam Constitutional: She appears well-nourished. NAD. Lethargic. HENT:  Head: Normocephalic and atraumatic.  Poor dentition  Eyes: Conjunctivae are normal. Right eye exhibits no discharge.  Left eye amblyopia  Neck: Normal range of motion. Neck supple. No thyromegaly present.  Cardiovascular: Normal rate and regular rhythm.  Respiratory: Effort normal and breath sounds normal. No respiratory distress.  GI: Soft. Bowel sounds are normal. She exhibits  no distension.  Musculoskeletal: She exhibits no edema or tenderness.  Neurological: She is lethargic.  Confused, exam limited due to confusion and cooperation Follows commands.  Cooperative with exam.  A&Ox2 Motor: RUE: Proximally 4 -/5, distally 3 -/5 with ataxia LUE: 4/5 proximal  distal B/L LE: Hip flexion, knee extension 2/5, ankle dorsi/plantarflexion 5/5 DTRs symmetric Sensation questionably intact to light touch  Skin: Skin is warm and dry.  Psychiatric: Her affect is inappropriate. Cognition and memory are impaired. She expresses inappropriate judgment. She is inattentive   Results for orders placed or performed during the hospital encounter of 03/13/15 (from the past 48 hour(s))  Urinalysis, Routine w reflex microscopic     Status: Abnormal   Collection Time: 03/13/15  1:17 PM  Result Value Ref Range   Color, Urine AMBER (A) YELLOW    Comment: BIOCHEMICALS MAY BE AFFECTED BY COLOR   APPearance CLEAR CLEAR   Specific Gravity, Urine 1.026 1.005 - 1.030   pH 6.0 5.0 - 8.0   Glucose, UA NEGATIVE NEGATIVE mg/dL   Hgb urine dipstick NEGATIVE NEGATIVE   Bilirubin Urine SMALL (A) NEGATIVE   Ketones, ur NEGATIVE NEGATIVE mg/dL   Protein, ur NEGATIVE NEGATIVE mg/dL   Nitrite NEGATIVE NEGATIVE   Leukocytes, UA NEGATIVE NEGATIVE    Comment: MICROSCOPIC NOT DONE ON URINES WITH NEGATIVE PROTEIN, BLOOD, LEUKOCYTES, NITRITE, OR GLUCOSE <1000 mg/dL.  Urine rapid drug screen (hosp performed)     Status: Abnormal   Collection Time: 03/13/15  1:17 PM  Result Value Ref Range   Opiates POSITIVE (A) NONE DETECTED   Cocaine NONE DETECTED NONE DETECTED   Benzodiazepines POSITIVE (A) NONE DETECTED   Amphetamines NONE DETECTED NONE DETECTED   Tetrahydrocannabinol NONE DETECTED NONE DETECTED   Barbiturates NONE DETECTED NONE DETECTED    Comment:        DRUG SCREEN FOR MEDICAL PURPOSES ONLY.  IF CONFIRMATION IS NEEDED FOR ANY PURPOSE, NOTIFY LAB WITHIN 5 DAYS.        LOWEST DETECTABLE LIMITS FOR URINE DRUG SCREEN Drug Class       Cutoff (ng/mL) Amphetamine      1000 Barbiturate      200 Benzodiazepine   379 Tricyclics       024 Opiates          300 Cocaine          300 THC              50   Comprehensive metabolic panel     Status: Abnormal   Collection Time:  03/13/15  1:40 PM  Result Value Ref Range   Sodium 143 135 - 145 mmol/L   Potassium 4.2 3.5 - 5.1 mmol/L   Chloride 106 101 - 111 mmol/L   CO2 21 (L) 22 - 32 mmol/L   Glucose, Bld 127 (H) 65 - 99 mg/dL   BUN 19 6 - 20 mg/dL   Creatinine, Ser 1.25 (H) 0.44 - 1.00 mg/dL   Calcium 9.0 8.9 - 10.3 mg/dL   Total Protein 7.1 6.5 - 8.1 g/dL   Albumin 3.5 3.5 - 5.0 g/dL   AST 46 (H) 15 - 41 U/L   ALT 24 14 - 54 U/L   Alkaline Phosphatase 69 38 - 126 U/L   Total Bilirubin 1.8 (H) 0.3 - 1.2 mg/dL   GFR calc non Af Amer 40 (L) >60 mL/min   GFR calc Af Amer 46 (L) >60 mL/min    Comment: (NOTE) The eGFR has been calculated  using the CKD EPI equation. This calculation has not been validated in all clinical situations. eGFR's persistently <60 mL/min signify possible Chronic Kidney Disease.    Anion gap 16 (H) 5 - 15  CBC with Differential     Status: Abnormal   Collection Time: 03/13/15  1:40 PM  Result Value Ref Range   WBC 9.2 4.0 - 10.5 K/uL   RBC 4.85 3.87 - 5.11 MIL/uL   Hemoglobin 15.1 (H) 12.0 - 15.0 g/dL   HCT 45.4 36.0 - 46.0 %   MCV 93.6 78.0 - 100.0 fL   MCH 31.1 26.0 - 34.0 pg   MCHC 33.3 30.0 - 36.0 g/dL   RDW 12.6 11.5 - 15.5 %   Platelets 259 150 - 400 K/uL   Neutrophils Relative % 69 %   Neutro Abs 6.3 1.7 - 7.7 K/uL   Lymphocytes Relative 22 %   Lymphs Abs 2.0 0.7 - 4.0 K/uL   Monocytes Relative 4 %   Monocytes Absolute 0.4 0.1 - 1.0 K/uL   Eosinophils Relative 5 %   Eosinophils Absolute 0.5 0.0 - 0.7 K/uL   Basophils Relative 0 %   Basophils Absolute 0.0 0.0 - 0.1 K/uL  CK     Status: None   Collection Time: 03/13/15  1:40 PM  Result Value Ref Range   Total CK 159 38 - 234 U/L  I-stat troponin, ED     Status: None   Collection Time: 03/13/15  1:45 PM  Result Value Ref Range   Troponin i, poc 0.00 0.00 - 0.08 ng/mL   Comment 3            Comment: Due to the release kinetics of cTnI, a negative result within the first hours of the onset of symptoms does not  rule out myocardial infarction with certainty. If myocardial infarction is still suspected, repeat the test at appropriate intervals.   Troponin I (q 6hr x 3)     Status: None   Collection Time: 03/13/15  6:49 PM  Result Value Ref Range   Troponin I <0.03 <0.031 ng/mL    Comment:        NO INDICATION OF MYOCARDIAL INJURY.   APTT     Status: None   Collection Time: 03/13/15  8:25 PM  Result Value Ref Range   aPTT 26 24 - 37 seconds  Protime-INR     Status: None   Collection Time: 03/13/15  8:25 PM  Result Value Ref Range   Prothrombin Time 15.0 11.6 - 15.2 seconds   INR 1.16 0.00 - 1.49  Glucose, capillary     Status: Abnormal   Collection Time: 03/13/15 10:17 PM  Result Value Ref Range   Glucose-Capillary 152 (H) 65 - 99 mg/dL  Glucose, capillary     Status: Abnormal   Collection Time: 03/14/15 12:27 AM  Result Value Ref Range   Glucose-Capillary 132 (H) 65 - 99 mg/dL   Comment 1 Notify RN    Comment 2 Document in Chart   Troponin I (q 6hr x 3)     Status: None   Collection Time: 03/14/15 12:39 AM  Result Value Ref Range   Troponin I <0.03 <0.031 ng/mL    Comment:        NO INDICATION OF MYOCARDIAL INJURY.   Hemoglobin A1c     Status: Abnormal   Collection Time: 03/14/15 12:39 AM  Result Value Ref Range   Hgb A1c MFr Bld 5.7 (H) 4.8 - 5.6 %  Comment: (NOTE)         Pre-diabetes: 5.7 - 6.4         Diabetes: >6.4         Glycemic control for adults with diabetes: <7.0    Mean Plasma Glucose 117 mg/dL    Comment: (NOTE) Performed At: [facility] [place], [place] [id number] Lindon Romp MD OE:[id number]   Lipid panel     Status: Abnormal   Collection Time: 03/14/15 12:39 AM  Result Value Ref Range   Cholesterol 176 0 - 200 mg/dL   Triglycerides 71 <150 mg/dL   HDL 50 >40 mg/dL   Total CHOL/HDL Ratio 3.5 RATIO   VLDL 14 0 - 40 mg/dL   LDL Cholesterol 112 (H) 0 - 99 mg/dL    Comment:        Total Cholesterol/HDL:CHD  Risk Coronary Heart Disease Risk Table                     Men   Women  1/2 Average Risk   3.4   3.3  Average Risk       5.0   4.4  2 X Average Risk   9.6   7.1  3 X Average Risk  23.4   11.0        Use the calculated Patient Ratio above and the CHD Risk Table to determine the patient's CHD Risk.        ATP III CLASSIFICATION (LDL):  <100     mg/dL   Optimal  100-129  mg/dL   Near or Above                    Optimal  130-159  mg/dL   Borderline  160-189  mg/dL   High  >190     mg/dL   Very High   Glucose, capillary     Status: Abnormal   Collection Time: 03/14/15  4:08 AM  Result Value Ref Range   Glucose-Capillary 111 (H) 65 - 99 mg/dL  Troponin I (q 6hr x 3)     Status: None   Collection Time: 03/14/15  6:57 AM  Result Value Ref Range   Troponin I <0.03 <0.031 ng/mL    Comment:        NO INDICATION OF MYOCARDIAL INJURY.   Glucose, capillary     Status: Abnormal   Collection Time: 03/14/15  7:40 AM  Result Value Ref Range   Glucose-Capillary 116 (H) 65 - 99 mg/dL  Glucose, capillary     Status: Abnormal   Collection Time: 03/14/15 11:55 AM  Result Value Ref Range   Glucose-Capillary 146 (H) 65 - 99 mg/dL  Glucose, capillary     Status: Abnormal   Collection Time: 03/14/15  4:56 PM  Result Value Ref Range   Glucose-Capillary 104 (H) 65 - 99 mg/dL  Glucose, capillary     Status: Abnormal   Collection Time: 03/15/15  8:32 AM  Result Value Ref Range   Glucose-Capillary 125 (H) 65 - 99 mg/dL  CBC     Status: Abnormal   Collection Time: 03/15/15  8:56 AM  Result Value Ref Range   WBC 10.6 (H) 4.0 - 10.5 K/uL   RBC 4.92 3.87 - 5.11 MIL/uL   Hemoglobin 15.2 (H) 12.0 - 15.0 g/dL   HCT 44.6 36.0 - 46.0 %   MCV 90.7 78.0 - 100.0 fL   MCH 30.9 26.0 - 34.0 pg  MCHC 34.1 30.0 - 36.0 g/dL   RDW 12.5 11.5 - 15.5 %   Platelets 302 150 - 400 K/uL   Ct Angio Head W/cm &/or Wo Cm  03/14/2015  CLINICAL DATA:  Altered mental status, increased lethargy and memory problems for  2 days. History of atrial fibrillation on Coumadin, now discontinued due to frequent falls. Poor historian. Assess acute stroke. EXAM: CT ANGIOGRAPHY HEAD AND NECK TECHNIQUE: Multidetector CT imaging of the head and neck was performed using the standard protocol during bolus administration of intravenous contrast. Multiplanar CT image reconstructions and MIPs were obtained to evaluate the vascular anatomy. Carotid stenosis measurements (when applicable) are obtained utilizing NASCET criteria, using the distal internal carotid diameter as the denominator. CONTRAST:  166m OMNIPAQUE IOHEXOL 350 MG/ML SOLN COMPARISON:  MRI of the brain March 13, 2015 FINDINGS: CTA NECK Aortic arch: Normal appearance of the thoracic arch, 2 vessel arch is a normal variant. The origins of the innominate, left Common carotid artery and subclavian artery are widely patent. Right carotid system: Common carotid artery is widely patent, coursing in a straight line fashion. Normal appearance of the carotid bifurcation without hemodynamically significant stenosis by NASCET criteria. Minimal eccentric calcific atherosclerosis. Normal appearance of the included internal carotid artery. Left carotid system: Common carotid artery is widely patent, coursing in a straight line fashion. Normal appearance of the carotid bifurcation without hemodynamically significant stenosis by NASCET criteria. Minimal eccentric calcific atherosclerosis. Normal appearance of the included internal carotid artery. Vertebral arteries:Left vertebral artery is dominant. Normal appearance of the vertebral arteries, which appear widely patent. Skeleton: No acute osseous process though bone windows have not been submitted. Moderate to severe degenerative change of the cervical spine without destructive bony lesions. Multiple absent teeth. Other neck: Soft tissues of the neck are nonacute though, not tailored for evaluation. CTA HEAD Anterior circulation: Normal appearance  of the cervical internal carotid arteries, petrous, cavernous and supra clinoid internal carotid arteries. Widely patent anterior communicating artery. Patent anterior and middle cerebral arteries. Mild stenosis LEFT M2 segment. Posterior circulation: Normal appearance of the vertebral arteries, vertebrobasilar junction and basilar artery, as well as main branch vessels. Occluded distal LEFT P1 segment. Moderate stenosis RIGHT P2 segment. No dissection, luminal irregularity, contrast extravasation or aneurysm within the anterior nor posterior circulation. No abnormal intracranial enhancement. IMPRESSION: CTA NECK:  Negative. CTA HEAD:  Occluded distal LEFT P1 segment. Moderate stenosis RIGHT P2 segment. Electronically Signed   By: CElon AlasM.D.   On: 03/14/2015 05:13   Dg Chest 2 View  03/13/2015  CLINICAL DATA:  82year old presenting with acute mental status changes and somnolence. Right upper extremity weakness. Current history of hypertension. Personal history of breast cancer. EXAM: CHEST  2 VIEW COMPARISON:  11/19/2013 and earlier. FINDINGS: Cardiac silhouette mildly to moderately enlarged. Thoracic aorta tortuous and atherosclerotic, unchanged. Hilar and mediastinal contours otherwise unremarkable. Mild diffuse interstitial pulmonary edema. Small bilateral pleural effusions. The no confluent airspace consolidation. Degenerative changes and DISH involving the thoracic spine. IMPRESSION: Mild CHF, with mild to moderate cardiomegaly, mild diffuse interstitial pulmonary edema and small bilateral pleural effusions. Electronically Signed   By: TEvangeline DakinM.D.   On: 03/13/2015 14:22   Ct Head Wo Contrast  03/13/2015  CLINICAL DATA:  Altered mental status. EXAM: CT HEAD WITHOUT CONTRAST TECHNIQUE: Contiguous axial images were obtained from the base of the skull through the vertex without intravenous contrast. COMPARISON:  12/18/2012 FINDINGS: Similar findings of advanced atrophy with sulcal  prominence centralized volume loss  with commensurate ex vacuo dilatation of the ventricular system. Extensive nearly confluent periventricular hypodensities are unchanged compatible microvascular ischemic disease. Old lacunar infarcts within the left side of the pons (image 9, series 3) as well as the bilateral insular cortices (image 16). Given extensive background parenchymal abnormalities, there is no CT evidence of superimposed acute large territory infarct. No intraparenchymal or extra-axial mass or hemorrhage. Unchanged size and configuration of the ventricles and basilar cisterns. No midline shift. Intracranial atherosclerosis. Limited visualization of the paranasal sinuses and mastoid air cells is normal. No air-fluid levels. Debris is noted within the bilateral external auditory canals. Regional soft tissues appear normal. No displaced calvarial fracture. IMPRESSION: Similar findings of advanced atrophy and microvascular ischemic disease without acute intracranial process. Electronically Signed   By: Sandi Mariscal M.D.   On: 03/13/2015 14:36   Ct Angio Neck W/cm &/or Wo/cm  03/14/2015  CLINICAL DATA:  Altered mental status, increased lethargy and memory problems for 2 days. History of atrial fibrillation on Coumadin, now discontinued due to frequent falls. Poor historian. Assess acute stroke. EXAM: CT ANGIOGRAPHY HEAD AND NECK TECHNIQUE: Multidetector CT imaging of the head and neck was performed using the standard protocol during bolus administration of intravenous contrast. Multiplanar CT image reconstructions and MIPs were obtained to evaluate the vascular anatomy. Carotid stenosis measurements (when applicable) are obtained utilizing NASCET criteria, using the distal internal carotid diameter as the denominator. CONTRAST:  172m OMNIPAQUE IOHEXOL 350 MG/ML SOLN COMPARISON:  MRI of the brain March 13, 2015 FINDINGS: CTA NECK Aortic arch: Normal appearance of the thoracic arch, 2 vessel arch is a  normal variant. The origins of the innominate, left Common carotid artery and subclavian artery are widely patent. Right carotid system: Common carotid artery is widely patent, coursing in a straight line fashion. Normal appearance of the carotid bifurcation without hemodynamically significant stenosis by NASCET criteria. Minimal eccentric calcific atherosclerosis. Normal appearance of the included internal carotid artery. Left carotid system: Common carotid artery is widely patent, coursing in a straight line fashion. Normal appearance of the carotid bifurcation without hemodynamically significant stenosis by NASCET criteria. Minimal eccentric calcific atherosclerosis. Normal appearance of the included internal carotid artery. Vertebral arteries:Left vertebral artery is dominant. Normal appearance of the vertebral arteries, which appear widely patent. Skeleton: No acute osseous process though bone windows have not been submitted. Moderate to severe degenerative change of the cervical spine without destructive bony lesions. Multiple absent teeth. Other neck: Soft tissues of the neck are nonacute though, not tailored for evaluation. CTA HEAD Anterior circulation: Normal appearance of the cervical internal carotid arteries, petrous, cavernous and supra clinoid internal carotid arteries. Widely patent anterior communicating artery. Patent anterior and middle cerebral arteries. Mild stenosis LEFT M2 segment. Posterior circulation: Normal appearance of the vertebral arteries, vertebrobasilar junction and basilar artery, as well as main branch vessels. Occluded distal LEFT P1 segment. Moderate stenosis RIGHT P2 segment. No dissection, luminal irregularity, contrast extravasation or aneurysm within the anterior nor posterior circulation. No abnormal intracranial enhancement. IMPRESSION: CTA NECK:  Negative. CTA HEAD:  Occluded distal LEFT P1 segment. Moderate stenosis RIGHT P2 segment. Electronically Signed   By: CElon AlasM.D.   On: 03/14/2015 05:13   Mr Brain Wo Contrast  03/13/2015  CLINICAL DATA:  TIA. Altered mental status and weakness for the past couple of days. EXAM: MRI HEAD WITHOUT CONTRAST TECHNIQUE: Multiplanar, multiecho pulse sequences of the brain and surrounding structures were obtained without intravenous contrast. COMPARISON:  Head CT 03/13/2015 FINDINGS: Some  sequences are moderately motion degraded. There is abnormal diffusion-weighted signal involving the mesial left temporal lobe/hippocampus as well as lateral aspect of the left thalamus with evidence of mildly reduced ADC. There is at most minimal associated edema/ T2 hyperintensity. There is moderate cerebral atrophy. A few chronic cerebral microhemorrhages are noted. Confluent T2 hyperintensities in the cerebral white matter bilaterally are nonspecific but compatible with extensive chronic small vessel ischemic disease. Chronic lacunar infarcts are noted in the right thalamus and deep cerebral white matter bilaterally. No mass, midline shift, or extra-axial fluid collection is seen. Prior bilateral cataract extraction is noted. Paranasal sinuses and mastoid air cells are clear. Major intracranial vascular flow voids are grossly preserved, although evaluation is limited by motion artifact and the left PCA is not well seen. IMPRESSION: 1. Small acute/early subacute infarcts in the left thalamus and mesial left temporal lobe. 2. Extensive chronic small vessel ischemic disease and cerebral atrophy. Electronically Signed   By: Logan Bores M.D.   On: 03/13/2015 18:09    Medical Problem List and Plan: 1.  Altered mental status/right hemiparesis secondary to left thalamic and left mesial temporal lobe infarct 2.  DVT Prophylaxis/Anticoagulation: Eliquis 3. Pain Management/chronic back pain: Voltaren Gel 4 times a day, Percocet 7.5-325 mg 1 tab every 8 hours as needed 4. Hypertension/atrial fibrillation. Monitor cardiac rate. Allow permissive  hypertension. Lopressor 12.5 mg twice a day, Cardizem CD 240 mg daily, ContinueEliquis  5. Neuropsych: This patient is capable of making decisions on her own behalf. 6. Skin/Wound Care: Routine skin checks 7. Fluids/Electrolytes/Nutrition: Routine I&O's with follow-up chemistries 8.Hyperlipidemia. Pravachol 9. History of breast cancer 2008.Aromasin 25 mg daily 9. Legally blind. She can see figures and objects.   Post Admission Physician Evaluation: 1. Functional deficits secondary  to left thalamic and left mesial temporal lobe infarct. 2. Patient is admitted to receive collaborative, interdisciplinary care between the physiatrist, rehab nursing staff, and therapy team. 3. Patient's level of medical complexity and substantial therapy needs in context of that medical necessity cannot be provided at a lesser intensity of care such as a SNF. 4. Patient has experienced substantial functional loss from his/her baseline which was documented above under the "Functional History" and "Functional Status" headings.  Judging by the patient's diagnosis, physical exam, and functional history, the patient has potential for functional progress which will result in measurable gains while on inpatient rehab.  These gains will be of substantial and practical use upon discharge  in facilitating mobility and self-care at the household level. 5. Physiatrist will provide 24 hour management of medical needs as well as oversight of the therapy plan/treatment and provide guidance as appropriate regarding the interaction of the two. 6. 24 hour rehab nursing will assist with bladder management, bowel management, safety, skin/wound care, disease management, medication administration, pain management and patient education and help integrate therapy concepts, techniques,education, etc. 7. PT will assess and treat for/with: Lower extremity strength, range of motion, stamina, balance, functional mobility, safety, adaptive techniques  and equipment, coping skills, pain control, stroke education.   Goals are: Min A. 8. OT will assess and treat for/with: ADL's, functional mobility, safety, upper extremity strength, adaptive techniques and equipment, ego support, and community reintegration.   Goals are: Min A. Therapy may proceed with showering this patient. 9. SLP will assess and treat for/with: Speech, language, cognitive processing.  Goals are: Min/ Mod A. 10. Case Management and Social Worker will assess and treat for psychological issues and discharge planning. 11. Team conference will be  held weekly to assess progress toward goals and to determine barriers to discharge. 12. Patient will receive at least 3 hours of therapy per day at least 5 days per week. 13. ELOS: 17-20 days.       14. Prognosis:  good   Delice Lesch, MD 03/15/2015

## 2015-03-15 NOTE — Progress Notes (Signed)
STROKE TEAM PROGRESS NOTE   SUBJECTIVE (INTERVAL HISTORY) Patient sitting up in the chair at the bedside. Patient states she has worked with PT this am. Pt much improved from yesterday. On eliquis now.   OBJECTIVE Temp:  [98 F (36.7 C)-98.4 F (36.9 C)] 98 F (36.7 C) (02/14 0349) Pulse Rate:  [75-125] 92 (02/14 1042) Cardiac Rhythm:  [-] Atrial fibrillation (02/14 0700) Resp:  [19-20] 19 (02/14 1042) BP: (152-192)/(79-110) 157/110 mmHg (02/14 1042) SpO2:  [96 %-99 %] 98 % (02/14 1042)  CBC:   Recent Labs Lab 03/13/15 1340 03/15/15 0856  WBC 9.2 10.6*  NEUTROABS 6.3  --   HGB 15.1* 15.2*  HCT 45.4 44.6  MCV 93.6 90.7  PLT 259 [id number]    Basic Metabolic Panel:   Recent Labs Lab 03/13/15 1340 03/15/15 0856  NA 143 141  K 4.2 3.4*  CL 106 107  CO2 21* 20*  GLUCOSE 127* 139*  BUN 19 11  CREATININE 1.25* 0.90  CALCIUM 9.0 8.9    Lipid Panel:     Component Value Date/Time   CHOL 176 03/14/2015 0039   TRIG 71 03/14/2015 0039   TRIG 134 12/18/2005 1533   HDL 50 03/14/2015 0039   CHOLHDL 3.5 03/14/2015 0039   CHOLHDL 4.4 CALC 12/18/2005 1533   VLDL 14 03/14/2015 0039   LDLCALC 112* 03/14/2015 0039   HgbA1c:  Lab Results  Component Value Date   HGBA1C 5.7* 03/14/2015   Urine Drug Screen:     Component Value Date/Time   LABOPIA POSITIVE* 03/13/2015 1317   LABOPIA PPS 08/19/2014 1354   COCAINSCRNUR NONE DETECTED 03/13/2015 1317   COCAINSCRNUR NEG 08/19/2014 1354   LABBENZ POSITIVE* 03/13/2015 1317   LABBENZ NEG 08/19/2014 1354   AMPHETMU NONE DETECTED 03/13/2015 1317   AMPHETMU NEG 08/19/2014 1354   THCU NONE DETECTED 03/13/2015 1317   THCU NEG 08/19/2014 1354   LABBARB NONE DETECTED 03/13/2015 1317   LABBARB NEG 08/19/2014 1354      IMAGING I have personally reviewed the radiological images below and agree with the radiology interpretations.  Dg Chest 2 View 03/13/2015   Mild CHF, with mild to moderate cardiomegaly, mild diffuse interstitial  pulmonary edema and small bilateral pleural effusions.   Ct Head Wo Contrast 03/13/2015  Similar findings of advanced atrophy and microvascular ischemic disease without acute intracranial process.   CTA NECK 03/14/2015   Negative.   CTA HEAD 03/14/2015   Occluded distal LEFT P1 segment. Moderate stenosis RIGHT P2 segment.   Mr Brain Wo Contrast 03/13/2015   1. Small acute/early subacute infarcts in the left thalamus and mesial left temporal lobe. 2. Extensive chronic small vessel ischemic disease and cerebral atrophy.   TTE  - Left ventricle: The cavity size was normal. There was mild focalbasal hypertrophy of the septum. Systolic function was normal.The estimated ejection fraction was in the range of 55% to 60%.Wall motion was normal; there were no regional wall motionabnormalities. - Mitral valve: There was mild regurgitation. - Pulmonic valve: There was trivial regurgitation  EEG - This is a normal EEG for the patients stated age. There were no focal, hemispheric or lateralizing features. No epileptiform activity was recorded.   PHYSICAL EXAM Temp:  [98 F (36.7 C)-98.4 F (36.9 C)] 98 F (36.7 C) (02/14 0349) Pulse Rate:  [75-125] 92 (02/14 1042) Resp:  [19-20] 19 (02/14 1042) BP: (152-192)/(79-110) 157/110 mmHg (02/14 1042) SpO2:  [96 %-99 %] 98 % (02/14 1042)  General - Well nourished, well developed,  in no apparent distress.  Ophthalmologic - Fundi not visualized due to noncooperation.  Cardiovascular - Regular rate and rhythm with no murmur, not in afib.  Mental Status -  Level of arousal and orientation to place and self were intact, but not orientated to age, time or people. Language including repetition, comprehension was assessed and found intact, but paucity of speech with naming 1/4. Fund of Knowledge was assessed and was impaired without knowing current presidents.  Cranial Nerves II - XII - II - Visual field testing questionable for right visual field  neglect. III, IV, VI - Extraocular movements intact. V - Facial sensation intact bilaterally. VII - Facial movement intact bilaterally. VIII - hearing & vestibular intact bilaterally. X - Palate elevates symmetrically, mild dysarthria. XI - Chin turning & shoulder shrug intact bilaterally. XII - Tongue protrusion intact.  Motor Strength - The patient's strength was RUE proximal 4+/5, distal 5-/5, RLE proximal and distal 4/5. LUE and LLE 5/5.  Bulk was normal and fasciculations were absent.   Motor Tone - Muscle tone was assessed at the neck and appendages and was normal.  Reflexes - The patient's reflexes were 1+ in all extremities and she had no pathological reflexes.  Sensory - Light touch, temperature/pinprick were assessed and were symmetrical.    Coordination - not cooperative on exam.  Tremor was absent.  Gait and Station - not tested due to safety concerns.   ASSESSMENT/PLAN Ms. JAMIESON HODGMAN is a 82 y.o. female with history of hypertension, hyperlipidemia, diabetes mellitus, chronic pain, atrial fibrillation presenting with change in mental status, increased lethargy, memory problems. She did not receive IV t-PA due to delay in arrival.   Stroke:  L thalamic and L mesial temporal lobe infarct embolic secondary to known atrial fibrillation   Resultant  Mild R hemiparesis, ? R visual neglect  MRI  Left thalamic and left mesial temporal lobe infarcts. Chronic small vessel disease and atrophy.  CTA head  L P1 occlusion. Atrophy and microvascular disease  CTA neck Unremarkable     2D Echo  EF 55-60%   EEG - no seizure  LDL 112  HgbA1c 5.7  Lovenox 40 mg sq daily for VTE prophylaxis Diet heart healthy/carb modified Room service appropriate?: Yes; Fluid consistency:: Thin  No antithrombotic prior to admission, initially placed on plavix, changed to Eliquis (apixaban) daily.  Patient counseled to be compliant with her antithrombotic medications  Ongoing  aggressive stroke risk factor management  Therapy recommendations:  CIR - consult done. Unsure of baseline deficits. Admissions coordinator to follow up. May need SNF.  Disposition:  pending   Atrial Fibrillation  Home anticoagulation:  none   Currently in NSR  Coumadin stopped 1 year ago due to falls. No fall in the past 1 year per family  Eliquis 5 mg daily added    Hypertension  Elevated 180-190s  BP goal gradually down to normotensive  Hyperlipidemia  Home meds:  No statin  LDL 112, goal < 70  Added lipitor 20  Continue statin at discharge  Diabetes  HgbA1c 5.7, goal < 7.0  Other Stroke Risk Factors  Advanced age  Obesity, Body mass index is 37.4 kg/(m^2).   Family hx stroke (mother)  Other Active Problems  Legally blind  Chest pain  Acute on chronic diastolic HR  Chronic back pain  Insomnia  Hx metastatic breast cancer - but pt stated cancer now in remission, no active cancer.   NOTHING FURTHER TO ADD FROM THE STROKE STANDPOINT  Patient has a 10-15% risk of having another stroke over the next year, the highest risk is within 2 weeks of the most recent stroke/TIA (risk of having a stroke following a stroke or TIA is the same).  Ongoing risk factor control by Primary Care Physician  Stroke Service will sign off. Please call should any needs arise.  Follow-up Stroke Clinic at [facility] with Cecille Rubin NP in 2 months, order placed.  Hospital day # 1  Neurology will sign off. Please call with questions. Pt will follow up with Cecille Rubin at [facility] in about 1 month. Thanks for the consult.  Rosalin Hawking, MD PhD Stroke Neurology 03/15/2015 10:45 PM    To contact Stroke Continuity provider, please refer to [email or web address] After hours, contact General Neurology

## 2015-03-15 NOTE — NC FL2 (Signed)
[place] LEVEL OF CARE SCREENING TOOL     IDENTIFICATION  Patient Name: Rose Sullivan Birthdate: 1933-11-13 Sex: female Admission Date (Current Location): 03/13/2015  [facility] [place] Number:  Herbalist and Address:  [facility], [place] [address], [place], [place] [address]      Provider Number: [id number]  Attending Physician Name and Address:  Caren Griffins, MD  Relative Name and Phone Number:  [name], daughter, [phone]    Current Level of Care: Hospital Recommended Level of Care: [place] Prior Approval Number:    Date Approved/Denied:   PASRR Number: [id number]  Discharge Plan: SNF    Current Diagnoses: Patient Active Problem List   Diagnosis Date Noted  . Acute CVA (cerebrovascular accident) ([place]) 03/14/2015  . CKD (chronic kidney disease)   . Altered mental status   . Acute encephalopathy 03/13/2015  . DM (diabetes mellitus) ([place]) 03/13/2015  . Cerebral embolism with cerebral infarction 03/13/2015  . Neck pain 02/09/2014  . Paresthesia of arm 02/09/2014  . Lumbar facet arthropathy 03/09/2013  . Unspecified hereditary and idiopathic peripheral neuropathy 03/09/2013  . Osteoarthritis of right hip 03/09/2013  . Osteoarthritis of right knee 03/09/2013  . Osteoarthritis of left knee 03/09/2013  . Anxiety state, unspecified 02/04/2013  . Chronic anticoagulation 11/04/2012  . Allergic rhinitis, cause unspecified 07/23/2012  . Bilateral hearing loss 06/27/2011  . Insomnia 06/27/2011  . Breast cancer metastasized to lung ([place]) 06/05/2011  . Preventative health care 01/21/2011  . Cervical radicular pain 01/09/2011  . Lumbar radicular pain 01/09/2011  . Vitamin D deficiency 08/31/2010  . Bilateral knee pain 08/31/2010  . Impaired glucose tolerance 05/18/2010  . Hypersomnia 05/18/2010  . Nocturia 05/18/2010  . Other specified forms of hearing loss 09/05/2009  . DYSPNEA 03/18/2009  .  Atrial fibrillation ([place]) 03/07/2009  . FATIGUE 12/10/2007  . GAIT DISTURBANCE 12/10/2007  . Hyperlipidemia 02/11/2007  . Essential hypertension 02/11/2007  . GERD 02/11/2007  . OSTEOPENIA 02/11/2007  . BLINDNESS, [place], [place] DEFINITION 11/25/2006  . LOW BACK PAIN, CHRONIC 11/25/2006    Orientation RESPIRATION BLADDER Height & Weight     Self  Normal Continent Weight: 191 lb 8 oz (86.864 kg) Height:  5' (152.4 cm)  BEHAVIORAL SYMPTOMS/MOOD NEUROLOGICAL BOWEL NUTRITION STATUS      Continent  (Please see DC summary)  AMBULATORY STATUS COMMUNICATION OF NEEDS Skin   Extensive Assist Verbally Normal                       Personal Care Assistance Level of Assistance  Bathing, Feeding, Dressing Bathing Assistance: Maximum assistance Feeding assistance: Maximum assistance Dressing Assistance: Maximum assistance     Functional Limitations Info  Sight Sight Info: Impaired        SPECIAL CARE FACTORS FREQUENCY  PT (By licensed PT), OT (By licensed OT)     PT Frequency: min 4x/week OT Frequency: min 3x/week            Contractures      Additional Factors Info  Code Status, Allergies, Insulin Sliding Scale Code Status Info: Partial Allergies Info: Trazodone And Nefazodone   Insulin Sliding Scale Info: insulin aspart (novoLOG) injection 0-9 Units       Current Medications (03/15/2015):  This is the current hospital active medication list Current Facility-Administered Medications  Medication Dose Route Frequency Provider Last Rate Last Dose  .  stroke: mapping our early stages of recovery book   Does  not apply Once Caren Griffins, MD      . apixaban Arne Cleveland) tablet 5 mg  5 mg Oral BID Romona Curls, RPH   5 mg at 03/15/15 1050  . diclofenac sodium (VOLTAREN) 1 % transdermal gel 2 g  2 g Topical QID Caren Griffins, MD   2 g at 03/14/15 2152  . diltiazem (CARDIZEM CD) 24 hr capsule 240 mg  240 mg Oral Daily Costin Karlyne Greenspan, MD   240 mg at 03/15/15 1050  .  exemestane (AROMASIN) tablet 25 mg  25 mg Oral Daily Costin Karlyne Greenspan, MD   25 mg at 03/15/15 1050  . hydrALAZINE (APRESOLINE) injection 5 mg  5 mg Intravenous Q6H PRN Fransico Meadow, PA-C   5 mg at 03/15/15 0243  . insulin aspart (novoLOG) injection 0-9 Units  0-9 Units Subcutaneous TID WC Costin Karlyne Greenspan, MD   1 Units at 03/15/15 0847  . metoprolol (LOPRESSOR) injection 5 mg  5 mg Intravenous Q5 min PRN Fransico Meadow, PA-C   5 mg at 03/15/15 [id number]  . metoprolol tartrate (LOPRESSOR) tablet 12.5 mg  12.5 mg Oral BID Costin Karlyne Greenspan, MD      . oxyCODONE-acetaminophen (PERCOCET) 7.5-325 MG per tablet 1 tablet  1 tablet Oral Q8H PRN Caren Griffins, MD   1 tablet at 03/13/15 2343  . pravastatin (PRAVACHOL) tablet 40 mg  40 mg Oral q1800 Costin Karlyne Greenspan, MD      . senna-docusate (Senokot-S) tablet 1 tablet  1 tablet Oral QHS PRN Caren Griffins, MD   1 tablet at 03/13/15 2009  . temazepam (RESTORIL) capsule 7.5 mg  7.5 mg Oral QHS PRN Costin Karlyne Greenspan, MD         Discharge Medications: Please see discharge summary for a list of discharge medications.  Relevant Imaging Results:  Relevant Lab Results:   Additional Information SSN: [id number]  [place], [place]

## 2015-03-15 NOTE — Progress Notes (Signed)
Rn reports pt has increased heart rate, atrial fibrillation 130's. Pt has a history of atrial fibrillation.  Pt given cardizem 10 Iv.  Rate decreased to 100-110.

## 2015-03-15 NOTE — Progress Notes (Addendum)
PROGRESS NOTE  Rose Sullivan [id number] DOB: 23-May-1933 DOA: 03/13/2015 PCP: [name], MD  HPI: 82 y.o. female has a past medical history significant for hypertension, hyperlipidemia, diabetes mellitus, chronic pain, is being brought by her daughter from home with a chief complaint of altered mental status and weakness for the past couple of days.  Interim history Patient admitted with altered mental status, MRI showed CVA, neurology was consulted. She finished workup for her stroke and was planned for discharge, however she developed A. fib with RVR, requiring up titration of her AV nodal agents, and metoprolol was added 2/14.  Subjective / 24 H Interval events - no chest pain, shortness of breath, no abdominal pain, nausea or vomiting.  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   Essential hypertension   Atrial fibrillation (HCC)   LOW BACK PAIN, CHRONIC   Breast cancer metastasized to lung (HCC)   Acute encephalopathy   DM (diabetes mellitus) ([place])   Cerebral embolism with cerebral infarction   CKD (chronic kidney disease)   Acute CVA (cerebrovascular accident) ([place])   Altered mental status   Atrial fibrillation with RVR ([place])   CVA - MRI positive for CVA, neuro consulted, appreciate input - started on Eliquis - EEG without seizure activity - 2D echo EF 55-60% - A1C 5.7 - LDL 112, on statin  Paroxysmal atrial fibrillation - in sinus on admission, developed A fib overnight 2/14, rates 130-140s on occasions, requiring IV diltiazem and IV Metoprolol. Asymptomatic - resting heart rate 100 this morning, 140s when working with PT today - will add low dose Metoprolol 12.5 BID, monitor on telemetry - if rates controlled may be able to go to rehab tomorrow.   Hypertension - Allow permissive hypertension in the setting of CVA, reintroduce home medications within 4-5 days as blood pressure allows  Acute encephalopathy - Mild confusion on  presentation, persistent today, this is likely due to her CVA  Diabetes mellitus - Continue sliding scale  Acute on Chronic diastolic heart failure - Most recent 2-D echo was in 2011 showed grade on diastolic dysfunction with an EF of 60-65%. - repeat echo similar  History of metastasized breast cancer - apparently in remission  Chest pain - Resolved, cardiac enzymes negative   Diet: Diet heart healthy/carb modified Room service appropriate?: Yes; Fluid consistency:: Thin Fluids: none  DVT Prophylaxis: Lovenox  Code Status: Partial Code Family Communication: d/w daughter  Disposition Plan: CIR pending rate control Barriers to discharge: A fib with RVR  Consultants:  Neurology   Procedures:  None    Antibiotics  Anti-infectives    None       Studies  Ct Angio Head W/cm &/or Wo Cm  03/14/2015  CLINICAL DATA:  Altered mental status, increased lethargy and memory problems for 2 days. History of atrial fibrillation on Coumadin, now discontinued due to frequent falls. Poor historian. Assess acute stroke. EXAM: CT ANGIOGRAPHY HEAD AND NECK TECHNIQUE: Multidetector CT imaging of the head and neck was performed using the standard protocol during bolus administration of intravenous contrast. Multiplanar CT image reconstructions and MIPs were obtained to evaluate the vascular anatomy. Carotid stenosis measurements (when applicable) are obtained utilizing NASCET criteria, using the distal internal carotid diameter as the denominator. CONTRAST:  19mL OMNIPAQUE IOHEXOL 350 MG/ML SOLN COMPARISON:  MRI of the brain March 13, 2015 FINDINGS: CTA NECK Aortic arch: Normal appearance of the thoracic arch, 2 vessel arch is a normal variant. The origins of the innominate, left Common carotid  artery and subclavian artery are widely patent. Right carotid system: Common carotid artery is widely patent, coursing in a straight line fashion. Normal appearance of the carotid bifurcation without  hemodynamically significant stenosis by NASCET criteria. Minimal eccentric calcific atherosclerosis. Normal appearance of the included internal carotid artery. Left carotid system: Common carotid artery is widely patent, coursing in a straight line fashion. Normal appearance of the carotid bifurcation without hemodynamically significant stenosis by NASCET criteria. Minimal eccentric calcific atherosclerosis. Normal appearance of the included internal carotid artery. Vertebral arteries:Left vertebral artery is dominant. Normal appearance of the vertebral arteries, which appear widely patent. Skeleton: No acute osseous process though bone windows have not been submitted. Moderate to severe degenerative change of the cervical spine without destructive bony lesions. Multiple absent teeth. Other neck: Soft tissues of the neck are nonacute though, not tailored for evaluation. CTA HEAD Anterior circulation: Normal appearance of the cervical internal carotid arteries, petrous, cavernous and supra clinoid internal carotid arteries. Widely patent anterior communicating artery. Patent anterior and middle cerebral arteries. Mild stenosis LEFT M2 segment. Posterior circulation: Normal appearance of the vertebral arteries, vertebrobasilar junction and basilar artery, as well as main branch vessels. Occluded distal LEFT P1 segment. Moderate stenosis RIGHT P2 segment. No dissection, luminal irregularity, contrast extravasation or aneurysm within the anterior nor posterior circulation. No abnormal intracranial enhancement. IMPRESSION: CTA NECK:  Negative. CTA HEAD:  Occluded distal LEFT P1 segment. Moderate stenosis RIGHT P2 segment. Electronically Signed   By: Elon Alas M.D.   On: 03/14/2015 05:13   Ct Angio Neck W/cm &/or Wo/cm  03/14/2015  CLINICAL DATA:  Altered mental status, increased lethargy and memory problems for 2 days. History of atrial fibrillation on Coumadin, now discontinued due to frequent falls. Poor  historian. Assess acute stroke. EXAM: CT ANGIOGRAPHY HEAD AND NECK TECHNIQUE: Multidetector CT imaging of the head and neck was performed using the standard protocol during bolus administration of intravenous contrast. Multiplanar CT image reconstructions and MIPs were obtained to evaluate the vascular anatomy. Carotid stenosis measurements (when applicable) are obtained utilizing NASCET criteria, using the distal internal carotid diameter as the denominator. CONTRAST:  125mL OMNIPAQUE IOHEXOL 350 MG/ML SOLN COMPARISON:  MRI of the brain March 13, 2015 FINDINGS: CTA NECK Aortic arch: Normal appearance of the thoracic arch, 2 vessel arch is a normal variant. The origins of the innominate, left Common carotid artery and subclavian artery are widely patent. Right carotid system: Common carotid artery is widely patent, coursing in a straight line fashion. Normal appearance of the carotid bifurcation without hemodynamically significant stenosis by NASCET criteria. Minimal eccentric calcific atherosclerosis. Normal appearance of the included internal carotid artery. Left carotid system: Common carotid artery is widely patent, coursing in a straight line fashion. Normal appearance of the carotid bifurcation without hemodynamically significant stenosis by NASCET criteria. Minimal eccentric calcific atherosclerosis. Normal appearance of the included internal carotid artery. Vertebral arteries:Left vertebral artery is dominant. Normal appearance of the vertebral arteries, which appear widely patent. Skeleton: No acute osseous process though bone windows have not been submitted. Moderate to severe degenerative change of the cervical spine without destructive bony lesions. Multiple absent teeth. Other neck: Soft tissues of the neck are nonacute though, not tailored for evaluation. CTA HEAD Anterior circulation: Normal appearance of the cervical internal carotid arteries, petrous, cavernous and supra clinoid internal carotid  arteries. Widely patent anterior communicating artery. Patent anterior and middle cerebral arteries. Mild stenosis LEFT M2 segment. Posterior circulation: Normal appearance of the vertebral arteries, vertebrobasilar junction and basilar  artery, as well as main branch vessels. Occluded distal LEFT P1 segment. Moderate stenosis RIGHT P2 segment. No dissection, luminal irregularity, contrast extravasation or aneurysm within the anterior nor posterior circulation. No abnormal intracranial enhancement. IMPRESSION: CTA NECK:  Negative. CTA HEAD:  Occluded distal LEFT P1 segment. Moderate stenosis RIGHT P2 segment. Electronically Signed   By: Elon Alas M.D.   On: 03/14/2015 05:13   Mr Brain Wo Contrast  03/13/2015  CLINICAL DATA:  TIA. Altered mental status and weakness for the past couple of days. EXAM: MRI HEAD WITHOUT CONTRAST TECHNIQUE: Multiplanar, multiecho pulse sequences of the brain and surrounding structures were obtained without intravenous contrast. COMPARISON:  Head CT 03/13/2015 FINDINGS: Some sequences are moderately motion degraded. There is abnormal diffusion-weighted signal involving the mesial left temporal lobe/hippocampus as well as lateral aspect of the left thalamus with evidence of mildly reduced ADC. There is at most minimal associated edema/ T2 hyperintensity. There is moderate cerebral atrophy. A few chronic cerebral microhemorrhages are noted. Confluent T2 hyperintensities in the cerebral white matter bilaterally are nonspecific but compatible with extensive chronic small vessel ischemic disease. Chronic lacunar infarcts are noted in the right thalamus and deep cerebral white matter bilaterally. No mass, midline shift, or extra-axial fluid collection is seen. Prior bilateral cataract extraction is noted. Paranasal sinuses and mastoid air cells are clear. Major intracranial vascular flow voids are grossly preserved, although evaluation is limited by motion artifact and the left PCA is  not well seen. IMPRESSION: 1. Small acute/early subacute infarcts in the left thalamus and mesial left temporal lobe. 2. Extensive chronic small vessel ischemic disease and cerebral atrophy. Electronically Signed   By: Logan Bores M.D.   On: 03/13/2015 18:09    Objective  Filed Vitals:   03/15/15 0442 03/15/15 0629 03/15/15 1042 03/15/15 1315  BP: 154/98 152/89 157/110 152/90  Pulse: 98 75 92 98  Temp:    97.8 F (36.6 C)  TempSrc:    Oral  Resp:   19 20  Height:      Weight:      SpO2:   98% 99%    Intake/Output Summary (Last 24 hours) at 03/15/15 1508 Last data filed at 03/15/15 1300  Gross per 24 hour  Intake    710 ml  Output    175 ml  Net    535 ml   Filed Weights   03/13/15 1852  Weight: 86.864 kg (191 lb 8 oz)    Exam:  GENERAL: NAD  HEENT: no scleral icterus, PERRL  NECK: supple, no LAD  LUNGS: CTA biL, no wheezing  HEART: irregular, without MRG  ABDOMEN: soft, non tender  EXTREMITIES: no clubbing / cyanosis  NEUROLOGIC: Right upper extremity weakness, otherwise 5 out of 5  SKIN: no rashes  Data Reviewed: Basic Metabolic Panel:  Recent Labs Lab 03/13/15 1340 03/15/15 0856  NA 143 141  K 4.2 3.4*  CL 106 107  CO2 21* 20*  GLUCOSE 127* 139*  BUN 19 11  CREATININE 1.25* 0.90  CALCIUM 9.0 8.9   Liver Function Tests:  Recent Labs Lab 03/13/15 1340  AST 46*  ALT 24  ALKPHOS 69  BILITOT 1.8*  PROT 7.1  ALBUMIN 3.5   CBC:  Recent Labs Lab 03/13/15 1340 03/15/15 0856  WBC 9.2 10.6*  NEUTROABS 6.3  --   HGB 15.1* 15.2*  HCT 45.4 44.6  MCV 93.6 90.7  PLT 259 302   Cardiac Enzymes:  Recent Labs Lab 03/13/15 1340 03/13/15 1849 03/14/15  0039 03/14/15 0657  CKTOTAL 159  --   --   --   TROPONINI  --  <0.03 <0.03 <0.03   CBG:  Recent Labs Lab 03/14/15 0740 03/14/15 1155 03/14/15 1656 03/15/15 0832 03/15/15 1232  GLUCAP 116* 146* 104* 125* 121*    Scheduled Meds: .  stroke: mapping our early stages of recovery  book   Does not apply Once  . apixaban  5 mg Oral BID  . diclofenac sodium  2 g Topical QID  . diltiazem  240 mg Oral Daily  . exemestane  25 mg Oral Daily  . [START ON 03/16/2015] Influenza vac split quadrivalent PF  0.5 mL Intramuscular Tomorrow-1000  . insulin aspart  0-9 Units Subcutaneous TID WC  . metoprolol tartrate  12.5 mg Oral BID  . [START ON 03/16/2015] pneumococcal 23 valent vaccine  0.5 mL Intramuscular Tomorrow-1000  . pravastatin  40 mg Oral q1800   Continuous Infusions:   Marzetta Board, MD [facility] Pager [phone]. If 7 PM - 7 AM, please contact night-coverage at [email or web address], password [email or web address] 03/15/2015, 3:08 PM  LOS: 1 day

## 2015-03-15 NOTE — Progress Notes (Signed)
ANTICOAGULATION CONSULT NOTE - Initial Consult  Pharmacy Consult for apixaban Indication: atrial fibrillation  Allergies  Allergen Reactions  . Trazodone And Nefazodone Other (See Comments)    hallucinations    Patient Measurements: Height: 5' (152.4 cm) Weight: 191 lb 8 oz (86.864 kg) IBW/kg (Calculated) : 45.5   Vital Signs: Temp: 98 F (36.7 C) (02/14 0349) Temp Source: Oral (02/14 0349) BP: 152/89 mmHg (02/14 0629) Pulse Rate: 75 (02/14 0629)  Labs:  Recent Labs  03/13/15 1340 03/13/15 1849 03/13/15 2025 03/14/15 0039 03/14/15 0657  HGB 15.1*  --   --   --   --   HCT 45.4  --   --   --   --   PLT 259  --   --   --   --   APTT  --   --  26  --   --   LABPROT  --   --  15.0  --   --   INR  --   --  1.16  --   --   CREATININE 1.25*  --   --   --   --   CKTOTAL 159  --   --   --   --   TROPONINI  --  <0.03  --  <0.03 <0.03    Estimated Creatinine Clearance: 35.8 mL/min (by C-G formula based on Cr of 1.25).   Medical History: Past Medical History  Diagnosis Date  . GLUCOSE INTOLERANCE 02/11/2007  . HYPERLIPIDEMIA 02/11/2007  . BLINDNESS, [place], [place] DEFINITION 11/25/2006  . Other specified forms of hearing loss 09/05/2009  . HYPERTENSION 02/11/2007  . Atrial fibrillation ([place]) 03/07/2009  . GERD 02/11/2007  . LOW BACK PAIN, CHRONIC 11/25/2006  . BACK PAIN 12/10/2007  . Pain in Soft Tissues of Limb 02/11/2007  . OSTEOPENIA 02/11/2007  . FATIGUE 12/10/2007  . GAIT DISTURBANCE 12/10/2007  . DYSPNEA 03/18/2009  . Cough 03/07/2009  . CHEST PAIN 12/10/2007  . Abdominal pain, left lower quadrant 04/01/2007  . BREAST CANCER, HX OF 11/25/2006  . Nocturia 05/18/2010  . Hypersomnia 05/18/2010  . Impaired glucose tolerance 05/18/2010  . Allergic rhinitis, cause unspecified 07/23/2012  . Anxiety state, unspecified 02/04/2013  . Cancer [facility])    Assessment: 87 yof with AMS, lethargy. Poor functional status and legally blind, history of metastatic BC, now in remission. PMH of  afib on warfarin until ~63yr ago but d/c'd due to falls. Pharmacy consulted to dose apixaban. On lovenox SQ for VTE ppx currently - last dose last night at ~2100. SCr 1.25, wt>60kg. CBC ok. No bleed documented.  Goal of Therapy:  Stroke prevention  Monitor platelets by anticoagulation protocol: Yes   Plan:  D/c lovenox Apixaban 5mg  bid Monitor CBC, s/sx bleeding, fall history  Elicia Lamp, PharmD, [facility] Clinical Pharmacist Pager [phone] 03/15/2015 8:23 AM

## 2015-03-15 NOTE — Progress Notes (Signed)
Notified on call provider about patient being in A-Fib with rate continually staying in 130-140's. Provider to come assess patient

## 2015-03-16 ENCOUNTER — Inpatient Hospital Stay (HOSPITAL_COMMUNITY)
Admission: AD | Admit: 2015-03-16 | Discharge: 2015-03-29 | DRG: 065 | Disposition: A | Discharge status: Home Health | Payer: Medicare Other | Source: Intra-hospital | Attending: Physical Medicine & Rehabilitation | Admitting: Physical Medicine & Rehabilitation

## 2015-03-16 DIAGNOSIS — R269 Unspecified abnormalities of gait and mobility: Secondary | ICD-10-CM | POA: Diagnosis present

## 2015-03-16 DIAGNOSIS — H9190 Unspecified hearing loss, unspecified ear: Secondary | ICD-10-CM | POA: Diagnosis present

## 2015-03-16 DIAGNOSIS — R410 Disorientation, unspecified: Secondary | ICD-10-CM

## 2015-03-16 DIAGNOSIS — C7800 Secondary malignant neoplasm of unspecified lung: Secondary | ICD-10-CM

## 2015-03-16 DIAGNOSIS — Z87891 Personal history of nicotine dependence: Secondary | ICD-10-CM | POA: Diagnosis not present

## 2015-03-16 DIAGNOSIS — I10 Essential (primary) hypertension: Secondary | ICD-10-CM | POA: Insufficient documentation

## 2015-03-16 DIAGNOSIS — I63212 Cerebral infarction due to unspecified occlusion or stenosis of left vertebral arteries: Secondary | ICD-10-CM | POA: Diagnosis not present

## 2015-03-16 DIAGNOSIS — I69351 Hemiplegia and hemiparesis following cerebral infarction affecting right dominant side: Secondary | ICD-10-CM

## 2015-03-16 DIAGNOSIS — I4891 Unspecified atrial fibrillation: Secondary | ICD-10-CM | POA: Diagnosis present

## 2015-03-16 DIAGNOSIS — M545 Low back pain: Secondary | ICD-10-CM | POA: Diagnosis present

## 2015-03-16 DIAGNOSIS — C50919 Malignant neoplasm of unspecified site of unspecified female breast: Secondary | ICD-10-CM

## 2015-03-16 DIAGNOSIS — I639 Cerebral infarction, unspecified: Principal | ICD-10-CM | POA: Diagnosis present

## 2015-03-16 DIAGNOSIS — I6322 Cerebral infarction due to unspecified occlusion or stenosis of basilar arteries: Secondary | ICD-10-CM | POA: Diagnosis not present

## 2015-03-16 DIAGNOSIS — I480 Paroxysmal atrial fibrillation: Secondary | ICD-10-CM | POA: Insufficient documentation

## 2015-03-16 DIAGNOSIS — K219 Gastro-esophageal reflux disease without esophagitis: Secondary | ICD-10-CM | POA: Diagnosis present

## 2015-03-16 DIAGNOSIS — Z853 Personal history of malignant neoplasm of breast: Secondary | ICD-10-CM | POA: Diagnosis not present

## 2015-03-16 DIAGNOSIS — G8929 Other chronic pain: Secondary | ICD-10-CM | POA: Diagnosis present

## 2015-03-16 DIAGNOSIS — H548 Legal blindness, as defined in USA: Secondary | ICD-10-CM | POA: Diagnosis present

## 2015-03-16 DIAGNOSIS — M1711 Unilateral primary osteoarthritis, right knee: Secondary | ICD-10-CM

## 2015-03-16 DIAGNOSIS — I739 Peripheral vascular disease, unspecified: Secondary | ICD-10-CM | POA: Diagnosis present

## 2015-03-16 DIAGNOSIS — E785 Hyperlipidemia, unspecified: Secondary | ICD-10-CM | POA: Diagnosis present

## 2015-03-16 DIAGNOSIS — M1712 Unilateral primary osteoarthritis, left knee: Secondary | ICD-10-CM

## 2015-03-16 DIAGNOSIS — I63219 Cerebral infarction due to unspecified occlusion or stenosis of unspecified vertebral arteries: Secondary | ICD-10-CM | POA: Diagnosis present

## 2015-03-16 DIAGNOSIS — M25561 Pain in right knee: Secondary | ICD-10-CM | POA: Diagnosis present

## 2015-03-16 DIAGNOSIS — I69359 Hemiplegia and hemiparesis following cerebral infarction affecting unspecified side: Secondary | ICD-10-CM | POA: Diagnosis not present

## 2015-03-16 DIAGNOSIS — I6381 Other cerebral infarction due to occlusion or stenosis of small artery: Secondary | ICD-10-CM | POA: Diagnosis present

## 2015-03-16 DIAGNOSIS — M1611 Unilateral primary osteoarthritis, right hip: Secondary | ICD-10-CM

## 2015-03-16 DIAGNOSIS — M47816 Spondylosis without myelopathy or radiculopathy, lumbar region: Secondary | ICD-10-CM

## 2015-03-16 DIAGNOSIS — M25551 Pain in right hip: Secondary | ICD-10-CM | POA: Diagnosis present

## 2015-03-16 DIAGNOSIS — M549 Dorsalgia, unspecified: Secondary | ICD-10-CM

## 2015-03-16 DIAGNOSIS — R4182 Altered mental status, unspecified: Secondary | ICD-10-CM | POA: Diagnosis present

## 2015-03-16 LAB — GLUCOSE, CAPILLARY
GLUCOSE-CAPILLARY: 100 mg/dL — AB (ref 65–99)
Glucose-Capillary: 121 mg/dL — ABNORMAL HIGH (ref 65–99)
Glucose-Capillary: 141 mg/dL — ABNORMAL HIGH (ref 65–99)

## 2015-03-16 MED ORDER — SORBITOL 70 % SOLN
30.0000 mL | Freq: Every day | Status: DC | PRN
  Administered 2015-03-20 – 2015-03-28 (×4): 30 mL via ORAL
  Filled 2015-03-16 (×3): qty 30

## 2015-03-16 MED ORDER — DICLOFENAC SODIUM 1 % TD GEL
2.0000 g | Freq: Four times a day (QID) | TRANSDERMAL | Status: DC
  Administered 2015-03-18 – 2015-03-29 (×18): 2 g via TOPICAL
  Filled 2015-03-16 (×2): qty 100

## 2015-03-16 MED ORDER — APIXABAN 5 MG PO TABS
5.0000 mg | ORAL_TABLET | Freq: Two times a day (BID) | ORAL | Status: DC
  Administered 2015-03-16 – 2015-03-29 (×26): 5 mg via ORAL
  Filled 2015-03-16 (×26): qty 1

## 2015-03-16 MED ORDER — DILTIAZEM HCL ER COATED BEADS 240 MG PO CP24
240.0000 mg | ORAL_CAPSULE | Freq: Every day | ORAL | Status: DC
  Administered 2015-03-17 – 2015-03-29 (×13): 240 mg via ORAL
  Filled 2015-03-16 (×13): qty 1

## 2015-03-16 MED ORDER — PRAVASTATIN SODIUM 40 MG PO TABS: 40.0000 mg | ORAL_TABLET | Freq: Every day | ORAL | Status: DC

## 2015-03-16 MED ORDER — ONDANSETRON HCL 4 MG/2ML IJ SOLN: 4.0000 mg | Freq: Four times a day (QID) | INTRAMUSCULAR | Status: DC | PRN

## 2015-03-16 MED ORDER — ONDANSETRON HCL 4 MG PO TABS: 4.0000 mg | ORAL_TABLET | Freq: Four times a day (QID) | ORAL | Status: DC | PRN

## 2015-03-16 MED ORDER — METOPROLOL TARTRATE 25 MG PO TABS: 12.5000 mg | ORAL_TABLET | Freq: Two times a day (BID) | ORAL | Status: DC

## 2015-03-16 MED ORDER — PRAVASTATIN SODIUM 40 MG PO TABS
40.0000 mg | ORAL_TABLET | Freq: Every day | ORAL | Status: DC
  Administered 2015-03-16 – 2015-03-28 (×13): 40 mg via ORAL
  Filled 2015-03-16 (×13): qty 1

## 2015-03-16 MED ORDER — METOPROLOL TARTRATE 12.5 MG HALF TABLET
12.5000 mg | ORAL_TABLET | Freq: Two times a day (BID) | ORAL | Status: DC
  Administered 2015-03-16 – 2015-03-29 (×26): 12.5 mg via ORAL
  Filled 2015-03-16 (×26): qty 1

## 2015-03-16 MED ORDER — INSULIN ASPART 100 UNIT/ML ~~LOC~~ SOLN: 0.0000 [IU] | Freq: Three times a day (TID) | SUBCUTANEOUS | Status: DC

## 2015-03-16 MED ORDER — APIXABAN 5 MG PO TABS: 5.0000 mg | ORAL_TABLET | Freq: Two times a day (BID) | ORAL | Status: DC

## 2015-03-16 MED ORDER — OXYCODONE-ACETAMINOPHEN 7.5-325 MG PO TABS
1.0000 | ORAL_TABLET | Freq: Three times a day (TID) | ORAL | Status: DC | PRN
  Administered 2015-03-17 – 2015-03-29 (×19): 1 via ORAL
  Filled 2015-03-16 (×21): qty 1

## 2015-03-16 MED ORDER — EXEMESTANE 25 MG PO TABS
25.0000 mg | ORAL_TABLET | Freq: Every day | ORAL | Status: DC
  Administered 2015-03-17 – 2015-03-29 (×13): 25 mg via ORAL
  Filled 2015-03-16 (×13): qty 1

## 2015-03-16 MED ORDER — ACETAMINOPHEN 325 MG PO TABS
325.0000 mg | ORAL_TABLET | ORAL | Status: DC | PRN
  Administered 2015-03-20 – 2015-03-23 (×4): 650 mg via ORAL
  Filled 2015-03-16 (×5): qty 2

## 2015-03-16 NOTE — Progress Notes (Signed)
Report called to Whitney, RN.

## 2015-03-16 NOTE — Progress Notes (Signed)
Ankit Lorie Phenix, MD Physician Signed Physical Medicine and Rehabilitation Consult Note 03/14/2015 12:27 PM  Related encounter: ED to Hosp-Admission (Current) from 03/13/2015 in [place] All Collapse All        Physical Medicine and Rehabilitation Consult Reason for Consult: Left thalamic and left mesial temporal lobe infarct  Referring Physician: [facility]   HPI: Rose Sullivan is a 82 y.o. right handed female with history of hypertension, hyperlipidemia, chronic back pain, legally blind and atrial fibrillation. By report patient lives with children as well as a personal care attendant who provide assistance as needed. Patient states that she needed help with all ADLs prior to admission. One level home with ramped entrance. Ambulated household distances with a walker. Presented 03/13/2015 with altered mental status, increasing lethargy and progressive weakness over the previous couple of days as well as memory problems. MRI of the brain showed small acute or early subacute infarct left thalamus and mesial left temporal lobe. Extensive chronic small vessel disease. Patient did not receive TPA. CTA of neck negative. CTA of the head showed occluded distal left P1 segment. Echocardiogram performed. EEG are pending. Neurology consulted presently on Plavix for CVA prophylaxis. Subcutaneous Lovenox for DVT prophylaxis. Regular diet consistency.   Review of Systems  Constitutional: Positive for malaise/fatigue. Negative for fever and chills.  HENT: Positive for hearing loss.  Eyes:   Legally blind. She can see figures and objects  Respiratory: Negative for cough and shortness of breath.  Cardiovascular: Negative for chest pain.  Gastrointestinal: Positive for constipation. Negative for nausea and vomiting.   GERD  Musculoskeletal: Positive for back pain and falls.  Neurological: Positive for weakness. Negative for seizures and headaches.    Psychiatric/Behavioral:   Anxiety  All other systems reviewed and are negative.  Past Medical History  Diagnosis Date  . GLUCOSE INTOLERANCE 02/11/2007  . HYPERLIPIDEMIA 02/11/2007  . BLINDNESS, [place], [place] DEFINITION 11/25/2006  . Other specified forms of hearing loss 09/05/2009  . HYPERTENSION 02/11/2007  . Atrial fibrillation ([place]) 03/07/2009  . GERD 02/11/2007  . LOW BACK PAIN, CHRONIC 11/25/2006  . BACK PAIN 12/10/2007  . Pain in Soft Tissues of Limb 02/11/2007  . OSTEOPENIA 02/11/2007  . FATIGUE 12/10/2007  . GAIT DISTURBANCE 12/10/2007  . DYSPNEA 03/18/2009  . Cough 03/07/2009  . CHEST PAIN 12/10/2007  . Abdominal pain, left lower quadrant 04/01/2007  . BREAST CANCER, HX OF 11/25/2006  . Nocturia 05/18/2010  . Hypersomnia 05/18/2010  . Impaired glucose tolerance 05/18/2010  . Allergic rhinitis, cause unspecified 07/23/2012  . Anxiety state, unspecified 02/04/2013  . Cancer [facility])    Past Surgical History  Procedure Laterality Date  . Dilation and curettage of uterus    . S/p mastectomy    . Appendectomy     Family History  Problem Relation Age of Onset  . Hypertension Brother   . Lung cancer Brother   . Colon cancer Father   . COPD Brother   . Stroke Mother    Social History:  reports that she has quit smoking. She has never used smokeless tobacco. She reports that she does not drink alcohol or use illicit drugs. Allergies:  Allergies  Allergen Reactions  . Trazodone And Nefazodone Other (See Comments)    hallucinations   Medications Prior to Admission  Medication Sig Dispense Refill  . diclofenac sodium (VOLTAREN) 1 % GEL Apply 1 application topically 4 (four) times daily. Apply to both knees and hands 3 Tube  4  . diltiazem (CARDIZEM CD) 240 MG 24 hr capsule TAKE ONE CAPSULE BY MOUTH EVERY DAY 30 capsule 6  . exemestane (AROMASIN)  25 MG tablet Take 1 tablet (25 mg total) by mouth daily. 90 tablet 3  . gabapentin (NEURONTIN) 100 MG capsule Take 100 mg by mouth 3 (three) times daily.    Marland Kitchen glucose blood (ONE TOUCH ULTRA TEST) test strip Use as instructed once daily to check blood sugar. ICD 10 code R73.02 100 each 11  . Lancets (ONETOUCH ULTRASOFT) lancets Use as instructed once daily to check blood sugar. ICD 10 code R73.02 100 each 11  . losartan (COZAAR) 50 MG tablet Take 50 mg by mouth daily.    Marland Kitchen oxyCODONE-acetaminophen (PERCOCET) 7.5-325 MG tablet Take 1 tablet by mouth every 8 (eight) hours as needed. 120 tablet 0  . temazepam (RESTORIL) 30 MG capsule TAKE ONE CAPSULE BY MOUTH AT BEDTIME AS NEEDED FOR SLEEP 90 capsule 1    Home: Home Living Family/patient expects to be discharged to:: Private residence Living Arrangements: Children Available Help at Discharge: Family, Personal care attendant Type of Home: House Home Access: Ramped entrance Home Layout: One level Home Equipment: Environmental consultant - 2 wheels, Wheelchair - manual  Functional History: Prior Function Level of Independence: Needs assistance Gait / Transfers Assistance Needed: Only amb household distances with walker Functional Status:  Mobility: Bed Mobility Overal bed mobility: Needs Assistance Bed Mobility: Sidelying to Sit Sidelying to sit: Mod assist General bed mobility comments: Assist to elevate trunk and bring hips to EOB. Pt with decr control of RUE Transfers Overall transfer level: Needs assistance Equipment used: Rolling walker (2 wheeled), Ambulation equipment used Transfer via Lift Equipment: Stedy Transfers: Sit to/from Stand, Risk manager Sit to Stand: +2 physical assistance, Mod assist Stand pivot transfers: +2 physical assistance, Mod assist (with Stedy) General transfer comment: Assist to bring hips up and control RUE and for balance. Pt with significant apraxia and ataxia. Pt unable to  keep RUE anywhere static. Pt also with difficulty placing RLE. Stood with walker on first attempt but didn't attempt pivot due to lack of control of RUE and RLE. Used Stedy for transfer to chair then to bsc then back to chair.      ADL:    Cognition: Cognition Overall Cognitive Status: Impaired/Different from baseline Orientation Level: Oriented to person, Oriented to place, Oriented to situation, Disoriented to time Cognition Arousal/Alertness: Awake/alert Behavior During Therapy: Anxious Overall Cognitive Status: Impaired/Different from baseline Area of Impairment: Awareness, Attention, Following commands, Safety/judgement, Problem solving Current Attention Level: Sustained Memory: Decreased short-term memory Following Commands: Follows one step commands consistently, Follows one step commands with increased time Safety/Judgement: Decreased awareness of safety, Decreased awareness of deficits Problem Solving: Difficulty sequencing, Requires verbal cues, Requires tactile cues, Slow processing  Blood pressure 191/103, pulse 81, temperature 98.4 F (36.9 C), temperature source Oral, resp. rate 18, height 5' (1.524 m), weight 86.864 kg (191 lb 8 oz), SpO2 95 %. Physical Exam  Vitals reviewed. Constitutional: She appears well-nourished.  HENT:  Head: Normocephalic and atraumatic.  Poor dentition  Eyes: Conjunctivae are normal. Right eye exhibits no discharge.  Left eye amblyopia  Neck: Normal range of motion. Neck supple. No thyromegaly present.  Cardiovascular: Normal rate and regular rhythm.  Respiratory: Effort normal and breath sounds normal. No respiratory distress.  GI: Soft. Bowel sounds are normal. She exhibits no distension.  Musculoskeletal: She exhibits no edema or tenderness.  Neurological: She is alert.  Confused, exam limited  due to confusion and cooperation Follows commands.  Cooperative with exam.  A&Ox2 Motor: R UE: Proximally 4 -/5, distally 3 -/5 with  ataxia L UE: 1+/5 proximal distal B/LLE: Hip flexion, knee extension 2/5, ankle dorsi/plantarflexion 5/5 DTRs symmetric Sensation questionably intact to light touch  Skin: Skin is warm and dry.  Psychiatric: Her affect is inappropriate. Cognition and memory are impaired. She expresses inappropriate judgment. She is inattentive.     Lab Results Last 24 Hours    Results for orders placed or performed during the hospital encounter of 03/13/15 (from the past 24 hour(s))  Urinalysis, Routine w reflex microscopic Status: Abnormal   Collection Time: 03/13/15 1:17 PM  Result Value Ref Range   Color, Urine AMBER (A) YELLOW   APPearance CLEAR CLEAR   Specific Gravity, Urine 1.026 1.005 - 1.030   pH 6.0 5.0 - 8.0   Glucose, UA NEGATIVE NEGATIVE mg/dL   Hgb urine dipstick NEGATIVE NEGATIVE   Bilirubin Urine SMALL (A) NEGATIVE   Ketones, ur NEGATIVE NEGATIVE mg/dL   Protein, ur NEGATIVE NEGATIVE mg/dL   Nitrite NEGATIVE NEGATIVE   Leukocytes, UA NEGATIVE NEGATIVE  Urine rapid drug screen (hosp performed) Status: Abnormal   Collection Time: 03/13/15 1:17 PM  Result Value Ref Range   Opiates POSITIVE (A) NONE DETECTED   Cocaine NONE DETECTED NONE DETECTED   Benzodiazepines POSITIVE (A) NONE DETECTED   Amphetamines NONE DETECTED NONE DETECTED   Tetrahydrocannabinol NONE DETECTED NONE DETECTED   Barbiturates NONE DETECTED NONE DETECTED  Comprehensive metabolic panel Status: Abnormal   Collection Time: 03/13/15 1:40 PM  Result Value Ref Range   Sodium 143 135 - 145 mmol/L   Potassium 4.2 3.5 - 5.1 mmol/L   Chloride 106 101 - 111 mmol/L   CO2 21 (L) 22 - 32 mmol/L   Glucose, Bld 127 (H) 65 - 99 mg/dL   BUN 19 6 - 20 mg/dL   Creatinine, Ser 1.25 (H) 0.44 - 1.00 mg/dL   Calcium 9.0 8.9 - 10.3 mg/dL   Total Protein 7.1 6.5 - 8.1 g/dL   Albumin 3.5 3.5  - 5.0 g/dL   AST 46 (H) 15 - 41 U/L   ALT 24 14 - 54 U/L   Alkaline Phosphatase 69 38 - 126 U/L   Total Bilirubin 1.8 (H) 0.3 - 1.2 mg/dL   GFR calc non Af Amer 40 (L) >60 mL/min   GFR calc Af Amer 46 (L) >60 mL/min   Anion gap 16 (H) 5 - 15  CBC with Differential Status: Abnormal   Collection Time: 03/13/15 1:40 PM  Result Value Ref Range   WBC 9.2 4.0 - 10.5 K/uL   RBC 4.85 3.87 - 5.11 MIL/uL   Hemoglobin 15.1 (H) 12.0 - 15.0 g/dL   HCT 45.4 36.0 - 46.0 %   MCV 93.6 78.0 - 100.0 fL   MCH 31.1 26.0 - 34.0 pg   MCHC 33.3 30.0 - 36.0 g/dL   RDW 12.6 11.5 - 15.5 %   Platelets 259 150 - 400 K/uL   Neutrophils Relative % 69 %   Neutro Abs 6.3 1.7 - 7.7 K/uL   Lymphocytes Relative 22 %   Lymphs Abs 2.0 0.7 - 4.0 K/uL   Monocytes Relative 4 %   Monocytes Absolute 0.4 0.1 - 1.0 K/uL   Eosinophils Relative 5 %   Eosinophils Absolute 0.5 0.0 - 0.7 K/uL   Basophils Relative 0 %   Basophils Absolute 0.0 0.0 - 0.1 K/uL  CK Status: None  Collection Time: 03/13/15 1:40 PM  Result Value Ref Range   Total CK 159 38 - 234 U/L  I-stat troponin, ED Status: None   Collection Time: 03/13/15 1:45 PM  Result Value Ref Range   Troponin i, poc 0.00 0.00 - 0.08 ng/mL   Comment 3     Troponin I (q 6hr x 3) Status: None   Collection Time: 03/13/15 6:49 PM  Result Value Ref Range   Troponin I <0.03 <0.031 ng/mL  APTT Status: None   Collection Time: 03/13/15 8:25 PM  Result Value Ref Range   aPTT 26 24 - 37 seconds  Protime-INR Status: None   Collection Time: 03/13/15 8:25 PM  Result Value Ref Range   Prothrombin Time 15.0 11.6 - 15.2 seconds   INR 1.16 0.00 - 1.49  Glucose, capillary Status: Abnormal   Collection Time: 03/13/15 10:17 PM  Result Value Ref Range    Glucose-Capillary 152 (H) 65 - 99 mg/dL  Glucose, capillary Status: Abnormal   Collection Time: 03/14/15 12:27 AM  Result Value Ref Range   Glucose-Capillary 132 (H) 65 - 99 mg/dL   Comment 1 Notify RN    Comment 2 Document in Chart   Troponin I (q 6hr x 3) Status: None   Collection Time: 03/14/15 12:39 AM  Result Value Ref Range   Troponin I <0.03 <0.031 ng/mL  Lipid panel Status: Abnormal   Collection Time: 03/14/15 12:39 AM  Result Value Ref Range   Cholesterol 176 0 - 200 mg/dL   Triglycerides 71 <150 mg/dL   HDL 50 >40 mg/dL   Total CHOL/HDL Ratio 3.5 RATIO   VLDL 14 0 - 40 mg/dL   LDL Cholesterol 112 (H) 0 - 99 mg/dL  Glucose, capillary Status: Abnormal   Collection Time: 03/14/15 4:08 AM  Result Value Ref Range   Glucose-Capillary 111 (H) 65 - 99 mg/dL  Troponin I (q 6hr x 3) Status: None   Collection Time: 03/14/15 6:57 AM  Result Value Ref Range   Troponin I <0.03 <0.031 ng/mL  Glucose, capillary Status: Abnormal   Collection Time: 03/14/15 7:40 AM  Result Value Ref Range   Glucose-Capillary 116 (H) 65 - 99 mg/dL  Glucose, capillary Status: Abnormal   Collection Time: 03/14/15 11:55 AM  Result Value Ref Range   Glucose-Capillary 146 (H) 65 - 99 mg/dL      Imaging Results (Last 48 hours)    Ct Angio Head W/cm &/or Wo Cm  03/14/2015 CLINICAL DATA: Altered mental status, increased lethargy and memory problems for 2 days. History of atrial fibrillation on Coumadin, now discontinued due to frequent falls. Poor historian. Assess acute stroke. EXAM: CT ANGIOGRAPHY HEAD AND NECK TECHNIQUE: Multidetector CT imaging of the head and neck was performed using the standard protocol during bolus administration of intravenous contrast. Multiplanar CT image reconstructions and MIPs were obtained to evaluate the vascular anatomy. Carotid stenosis  measurements (when applicable) are obtained utilizing NASCET criteria, using the distal internal carotid diameter as the denominator. CONTRAST: 139mL OMNIPAQUE IOHEXOL 350 MG/ML SOLN COMPARISON: MRI of the brain March 13, 2015 FINDINGS: CTA NECK Aortic arch: Normal appearance of the thoracic arch, 2 vessel arch is a normal variant. The origins of the innominate, left Common carotid artery and subclavian artery are widely patent. Right carotid system: Common carotid artery is widely patent, coursing in a straight line fashion. Normal appearance of the carotid bifurcation without hemodynamically significant stenosis by NASCET criteria. Minimal eccentric calcific atherosclerosis. Normal appearance of the included internal  carotid artery. Left carotid system: Common carotid artery is widely patent, coursing in a straight line fashion. Normal appearance of the carotid bifurcation without hemodynamically significant stenosis by NASCET criteria. Minimal eccentric calcific atherosclerosis. Normal appearance of the included internal carotid artery. Vertebral arteries:Left vertebral artery is dominant. Normal appearance of the vertebral arteries, which appear widely patent. Skeleton: No acute osseous process though bone windows have not been submitted. Moderate to severe degenerative change of the cervical spine without destructive bony lesions. Multiple absent teeth. Other neck: Soft tissues of the neck are nonacute though, not tailored for evaluation. CTA HEAD Anterior circulation: Normal appearance of the cervical internal carotid arteries, petrous, cavernous and supra clinoid internal carotid arteries. Widely patent anterior communicating artery. Patent anterior and middle cerebral arteries. Mild stenosis LEFT M2 segment. Posterior circulation: Normal appearance of the vertebral arteries, vertebrobasilar junction and basilar artery, as well as main branch vessels. Occluded distal LEFT P1 segment. Moderate stenosis  RIGHT P2 segment. No dissection, luminal irregularity, contrast extravasation or aneurysm within the anterior nor posterior circulation. No abnormal intracranial enhancement. IMPRESSION: CTA NECK: Negative. CTA HEAD: Occluded distal LEFT P1 segment. Moderate stenosis RIGHT P2 segment. Electronically Signed By: Elon Alas M.D. On: 03/14/2015 05:13   Dg Chest 2 View  03/13/2015 CLINICAL DATA: 82 year old presenting with acute mental status changes and somnolence. Right upper extremity weakness. Current history of hypertension. Personal history of breast cancer. EXAM: CHEST 2 VIEW COMPARISON: 11/19/2013 and earlier. FINDINGS: Cardiac silhouette mildly to moderately enlarged. Thoracic aorta tortuous and atherosclerotic, unchanged. Hilar and mediastinal contours otherwise unremarkable. Mild diffuse interstitial pulmonary edema. Small bilateral pleural effusions. The no confluent airspace consolidation. Degenerative changes and DISH involving the thoracic spine. IMPRESSION: Mild CHF, with mild to moderate cardiomegaly, mild diffuse interstitial pulmonary edema and small bilateral pleural effusions. Electronically Signed By: Evangeline Dakin M.D. On: 03/13/2015 14:22   Ct Head Wo Contrast  03/13/2015 CLINICAL DATA: Altered mental status. EXAM: CT HEAD WITHOUT CONTRAST TECHNIQUE: Contiguous axial images were obtained from the base of the skull through the vertex without intravenous contrast. COMPARISON: 12/18/2012 FINDINGS: Similar findings of advanced atrophy with sulcal prominence centralized volume loss with commensurate ex vacuo dilatation of the ventricular system. Extensive nearly confluent periventricular hypodensities are unchanged compatible microvascular ischemic disease. Old lacunar infarcts within the left side of the pons (image 9, series 3) as well as the bilateral insular cortices (image 16). Given extensive background parenchymal abnormalities, there is no CT evidence of  superimposed acute large territory infarct. No intraparenchymal or extra-axial mass or hemorrhage. Unchanged size and configuration of the ventricles and basilar cisterns. No midline shift. Intracranial atherosclerosis. Limited visualization of the paranasal sinuses and mastoid air cells is normal. No air-fluid levels. Debris is noted within the bilateral external auditory canals. Regional soft tissues appear normal. No displaced calvarial fracture. IMPRESSION: Similar findings of advanced atrophy and microvascular ischemic disease without acute intracranial process. Electronically Signed By: Sandi Mariscal M.D. On: 03/13/2015 14:36   Ct Angio Neck W/cm &/or Wo/cm  03/14/2015 CLINICAL DATA: Altered mental status, increased lethargy and memory problems for 2 days. History of atrial fibrillation on Coumadin, now discontinued due to frequent falls. Poor historian. Assess acute stroke. EXAM: CT ANGIOGRAPHY HEAD AND NECK TECHNIQUE: Multidetector CT imaging of the head and neck was performed using the standard protocol during bolus administration of intravenous contrast. Multiplanar CT image reconstructions and MIPs were obtained to evaluate the vascular anatomy. Carotid stenosis measurements (when applicable) are obtained utilizing NASCET criteria, using the distal internal carotid  diameter as the denominator. CONTRAST: 13mL OMNIPAQUE IOHEXOL 350 MG/ML SOLN COMPARISON: MRI of the brain March 13, 2015 FINDINGS: CTA NECK Aortic arch: Normal appearance of the thoracic arch, 2 vessel arch is a normal variant. The origins of the innominate, left Common carotid artery and subclavian artery are widely patent. Right carotid system: Common carotid artery is widely patent, coursing in a straight line fashion. Normal appearance of the carotid bifurcation without hemodynamically significant stenosis by NASCET criteria. Minimal eccentric calcific atherosclerosis. Normal appearance of the included internal carotid artery.  Left carotid system: Common carotid artery is widely patent, coursing in a straight line fashion. Normal appearance of the carotid bifurcation without hemodynamically significant stenosis by NASCET criteria. Minimal eccentric calcific atherosclerosis. Normal appearance of the included internal carotid artery. Vertebral arteries:Left vertebral artery is dominant. Normal appearance of the vertebral arteries, which appear widely patent. Skeleton: No acute osseous process though bone windows have not been submitted. Moderate to severe degenerative change of the cervical spine without destructive bony lesions. Multiple absent teeth. Other neck: Soft tissues of the neck are nonacute though, not tailored for evaluation. CTA HEAD Anterior circulation: Normal appearance of the cervical internal carotid arteries, petrous, cavernous and supra clinoid internal carotid arteries. Widely patent anterior communicating artery. Patent anterior and middle cerebral arteries. Mild stenosis LEFT M2 segment. Posterior circulation: Normal appearance of the vertebral arteries, vertebrobasilar junction and basilar artery, as well as main branch vessels. Occluded distal LEFT P1 segment. Moderate stenosis RIGHT P2 segment. No dissection, luminal irregularity, contrast extravasation or aneurysm within the anterior nor posterior circulation. No abnormal intracranial enhancement. IMPRESSION: CTA NECK: Negative. CTA HEAD: Occluded distal LEFT P1 segment. Moderate stenosis RIGHT P2 segment. Electronically Signed By: Elon Alas M.D. On: 03/14/2015 05:13   Mr Brain Wo Contrast  03/13/2015 CLINICAL DATA: TIA. Altered mental status and weakness for the past couple of days. EXAM: MRI HEAD WITHOUT CONTRAST TECHNIQUE: Multiplanar, multiecho pulse sequences of the brain and surrounding structures were obtained without intravenous contrast. COMPARISON: Head CT 03/13/2015 FINDINGS: Some sequences are moderately motion degraded. There is  abnormal diffusion-weighted signal involving the mesial left temporal lobe/hippocampus as well as lateral aspect of the left thalamus with evidence of mildly reduced ADC. There is at most minimal associated edema/ T2 hyperintensity. There is moderate cerebral atrophy. A few chronic cerebral microhemorrhages are noted. Confluent T2 hyperintensities in the cerebral white matter bilaterally are nonspecific but compatible with extensive chronic small vessel ischemic disease. Chronic lacunar infarcts are noted in the right thalamus and deep cerebral white matter bilaterally. No mass, midline shift, or extra-axial fluid collection is seen. Prior bilateral cataract extraction is noted. Paranasal sinuses and mastoid air cells are clear. Major intracranial vascular flow voids are grossly preserved, although evaluation is limited by motion artifact and the left PCA is not well seen. IMPRESSION: 1. Small acute/early subacute infarcts in the left thalamus and mesial left temporal lobe. 2. Extensive chronic small vessel ischemic disease and cerebral atrophy. Electronically Signed By: Logan Bores M.D. On: 03/13/2015 18:09     Assessment/Plan: Diagnosis: Left thalamic and left mesial temporal lobe infarct  Labs and images independently reviewed. Records reviewed and summated above. Stroke: Continue secondary stroke prophylaxis and Risk Factor Modification listed below:  Antiplatelet therapy Blood Pressure Management: Continue current medication with prn's with permisive HTN per primary team Statin Agent Diabetes management: Right sided hemiparesis: fit for orthotics to prevent contractures (resting hand splint for day, wrist cock up splint at night, etc);  Motor recovery: Fluoxetine  1. Does the need for close, 24 hr/day medical supervision in concert with the patient's rehab needs make it unreasonable for this patient to be served in a less intensive setting? Potentially 2. Co-Morbidities requiring  supervision/potential complications: HTN (monitor and provide prns in accordance with increased physical exertion and pain), hyperlipidemia (cont meds), chronic back pain (Biofeedback training with therapies to help reduce reliance on opiate pain medications, monitor pain control during therapies, and sedation at rest and titrate to maximum efficacy to ensure participation and gains in therapies), legally blind, atrial fibrillation (monitor HR in accordance with increased activity, cont meds), CKD (avoid nephrotoxic meds) 3. Due to bladder management, bowel management, safety, skin/wound care, disease management, medication administration, pain management and patient education, does the patient require 24 hr/day rehab nursing? Yes 4. Does the patient require coordinated care of a physician, rehab nurse, PT (1-2 hrs/day, 5 days/week), OT (1-2 hrs/day, 5 days/week) and SLP (1-2 hrs/day, 5 days/week) to address physical and functional deficits in the context of the above medical diagnosis(es)? Yes Addressing deficits in the following areas: balance, endurance, locomotion, strength, transferring, bathing, dressing, feeding, grooming, toileting, cognition, speech and psychosocial support 5. Can the patient actively participate in an intensive therapy program of at least 3 hrs of therapy per day at least 5 days per week? Yes 6. The potential for patient to make measurable gains while on inpatient rehab is good 7. Anticipated functional outcomes upon discharge from inpatient rehab are min assist with PT, min assist with OT, min assist and mod assist with SLP. 8. Estimated rehab length of stay to reach the above functional goals is: 15-18 days. 9. Does the patient have adequate social supports and living environment to accommodate these discharge functional goals? Yes 10. Anticipated D/C setting: Home 11. Anticipated post D/C treatments: HH therapy and Home excercise program 12. Overall Rehab/Functional  Prognosis: good and fair  RECOMMENDATIONS: This patient's condition is appropriate for continued rehabilitative care in the following setting: Patient baseline level of functioning is unclear. She may be at baseline as she was not able to perform any ADLs without assistance prior to admission and received assisstance from her from her daughter as well as an assistance. Will have to clarify baseline level of functioning, in addition to awaiting completion of medical workup (EEG). If she does have a significant increase in deficits or is at her baseline, pt would likely be most appropriate for SNF.   Patient has agreed to participate in recommended program. Potentially Note that insurance prior authorization may be required for reimbursement for recommended care.  Comment: Rehab Admissions Coordinator to follow up.  Delice Lesch, MD 03/14/2015       Revision History     Date/Time User Provider Type Action   03/14/2015 1:10 PM Ankit Lorie Phenix, MD Physician Sign   03/14/2015 12:39 PM Cathlyn Parsons, PA-C Physician Assistant Pend   View Details Report       Routing History     Date/Time From To Method   03/14/2015 1:10 PM Ankit Lorie Phenix, MD Ankit Lorie Phenix, MD In Basket   03/14/2015 1:10 PM Ankit Lorie Phenix, MD Biagio Borg, MD In [facility]

## 2015-03-16 NOTE — H&P (View-Only) (Signed)
Physical Medicine and Rehabilitation Admission H&P    Chief Complaint  Patient presents with  . Altered Mental Status  . Weakness  : HPI: Rose Sullivan is a 82 y.o. right handed female with history of hypertension, hyperlipidemia, chronic back pain followed at the pain clinic by Dr.Swartz, legally blind and atrial fibrillation. By report patient lives with children as well as a personal care attendant who provide assistance as needed. Patient states that she needed help with all ADLs prior to admission. Noted baseline memory impairments. One level home with ramped entrance. Ambulated household distances with a walker. Presented 03/13/2015 with altered mental status, increasing lethargy and progressive weakness over the previous couple of days as well as memory problems. MRI of the brain showed small acute or early subacute infarct left thalamus and mesial left temporal lobe. Extensive chronic small vessel disease. Patient did not receive TPA. CTA of neck negative. CTA of the head showed occluded distal left P1 segment. Echocardiogram with ejection fraction of 60% no wall motion abnormalities. EEG was negative. Blood pressure heart rate monitored with heart rate 130s-140s on occasion and asymptomatic as patient remains on Cardizem and Lopressor was added. Neurology consulted maintained on Eliquis for CVA prophylaxis with history of atrial fibrillation. Regular diet consistency. Physical therapy evaluation completed with recommendations of physical medicine rehabilitation consult.  ROS Review of Systems  Constitutional: Positive for malaise/fatigue. Negative for fever and chills.  HENT: Positive for hearing loss.  Eyes:   Legally blind. She can see figures and objects  Respiratory: Negative for cough and shortness of breath.  Cardiovascular: Negative for chest pain.  Gastrointestinal: Positive for constipation. Negative for nausea and vomiting.   GERD  Musculoskeletal:  Positive for back pain and falls.  Neurological: Positive for weakness. Negative for seizures and headaches.  Psychiatric/Behavioral:   Anxiety , memory loss, insomnia All other systems reviewed and are negative  Past Medical History  Diagnosis Date  . GLUCOSE INTOLERANCE 02/11/2007  . HYPERLIPIDEMIA 02/11/2007  . BLINDNESS, [place], [place] DEFINITION 11/25/2006  . Other specified forms of hearing loss 09/05/2009  . HYPERTENSION 02/11/2007  . Atrial fibrillation ([place]) 03/07/2009  . GERD 02/11/2007  . LOW BACK PAIN, CHRONIC 11/25/2006  . BACK PAIN 12/10/2007  . Pain in Soft Tissues of Limb 02/11/2007  . OSTEOPENIA 02/11/2007  . FATIGUE 12/10/2007  . GAIT DISTURBANCE 12/10/2007  . DYSPNEA 03/18/2009  . Cough 03/07/2009  . CHEST PAIN 12/10/2007  . Abdominal pain, left lower quadrant 04/01/2007  . BREAST CANCER, HX OF 11/25/2006  . Nocturia 05/18/2010  . Hypersomnia 05/18/2010  . Impaired glucose tolerance 05/18/2010  . Allergic rhinitis, cause unspecified 07/23/2012  . Anxiety state, unspecified 02/04/2013  . Cancer [facility])    Past Surgical History  Procedure Laterality Date  . Dilation and curettage of uterus    . S/p mastectomy    . Appendectomy     Family History  Problem Relation Age of Onset  . Hypertension Brother   . Lung cancer Brother   . Colon cancer Father   . COPD Brother   . Stroke Mother    Social History:  reports that she has quit smoking. She has never used smokeless tobacco. She reports that she does not drink alcohol or use illicit drugs. Allergies:  Allergies  Allergen Reactions  . Trazodone And Nefazodone Other (See Comments)    hallucinations   Medications Prior to Admission  Medication Sig Dispense Refill  . diclofenac sodium (VOLTAREN) 1 % GEL Apply 1  application topically 4 (four) times daily. Apply to both knees and hands 3 Tube 4  . diltiazem (CARDIZEM CD) 240 MG 24 hr capsule TAKE ONE CAPSULE BY MOUTH EVERY DAY 30 capsule 6  . exemestane (AROMASIN) 25 MG  tablet Take 1 tablet (25 mg total) by mouth daily. 90 tablet 3  . gabapentin (NEURONTIN) 100 MG capsule Take 100 mg by mouth 3 (three) times daily.    Marland Kitchen glucose blood (ONE TOUCH ULTRA TEST) test strip Use as instructed once daily to check blood sugar.  ICD 10 code R73.02 100 each 11  . Lancets (ONETOUCH ULTRASOFT) lancets Use as instructed once daily to check blood sugar.  ICD 10 code R73.02 100 each 11  . losartan (COZAAR) 50 MG tablet Take 50 mg by mouth daily.    Marland Kitchen oxyCODONE-acetaminophen (PERCOCET) 7.5-325 MG tablet Take 1 tablet by mouth every 8 (eight) hours as needed. 120 tablet 0  . temazepam (RESTORIL) 30 MG capsule TAKE ONE CAPSULE BY MOUTH AT BEDTIME AS NEEDED FOR SLEEP 90 capsule 1    Home: Home Living Family/patient expects to be discharged to:: Private residence Living Arrangements: Children Available Help at Discharge: Family, Personal care attendant Type of Home: House Home Access: Ramped entrance Home Layout: One level Home Equipment: Environmental consultant - 2 wheels, Wheelchair - manual   Functional History: Prior Function Level of Independence: Needs assistance Gait / Transfers Assistance Needed: Only amb household distances with  walker  Functional Status:  Mobility: Bed Mobility Overal bed mobility: Needs Assistance Bed Mobility: Sidelying to Sit Sidelying to sit: Mod assist General bed mobility comments: Assist to elevate trunk and bring hips to EOB. Pt with decr control of RUE Transfers Overall transfer level: Needs assistance Equipment used: Rolling walker (2 wheeled), Ambulation equipment used Transfer via Lift Equipment: Stedy Transfers: Sit to/from Stand, Risk manager Sit to Stand: +2 physical assistance, Mod assist Stand pivot transfers: +2 physical assistance, Mod assist (with Stedy) General transfer comment: Assist to bring hips up and control RUE and for balance. Pt with significant apraxia and ataxia. Pt unable to keep RUE anywhere static. Pt also with  difficulty placing RLE. Stood with walker on first attempt but didn't attempt pivot due to lack of control of RUE and RLE. Used Stedy for transfer to chair then to bsc then back to chair.      ADL:    Cognition: Cognition Overall Cognitive Status: Impaired/Different from baseline Orientation Level: Oriented to person, Disoriented to time, Disoriented to place, Disoriented to person Cognition Arousal/Alertness: Awake/alert Behavior During Therapy: Anxious Overall Cognitive Status: Impaired/Different from baseline Area of Impairment: Awareness, Attention, Following commands, Safety/judgement, Problem solving Current Attention Level: Sustained Memory: Decreased short-term memory Following Commands: Follows one step commands consistently, Follows one step commands with increased time Safety/Judgement: Decreased awareness of safety, Decreased awareness of deficits Problem Solving: Difficulty sequencing, Requires verbal cues, Requires tactile cues, Slow processing  Physical Exam: Blood pressure 152/89, pulse 75, temperature 98 F (36.7 C), temperature source Oral, resp. rate 20, height 5' (1.524 m), weight 86.864 kg (191 lb 8 oz), SpO2 99 %. Physical Exam Constitutional: She appears well-nourished. NAD. Lethargic. HENT:  Head: Normocephalic and atraumatic.  Poor dentition  Eyes: Conjunctivae are normal. Right eye exhibits no discharge.  Left eye amblyopia  Neck: Normal range of motion. Neck supple. No thyromegaly present.  Cardiovascular: Normal rate and regular rhythm.  Respiratory: Effort normal and breath sounds normal. No respiratory distress.  GI: Soft. Bowel sounds are normal. She exhibits  no distension.  Musculoskeletal: She exhibits no edema or tenderness.  Neurological: She is lethargic.  Confused, exam limited due to confusion and cooperation Follows commands.  Cooperative with exam.  A&Ox2 Motor: RUE: Proximally 4 -/5, distally 3 -/5 with ataxia LUE: 4/5 proximal  distal B/L LE: Hip flexion, knee extension 2/5, ankle dorsi/plantarflexion 5/5 DTRs symmetric Sensation questionably intact to light touch  Skin: Skin is warm and dry.  Psychiatric: Her affect is inappropriate. Cognition and memory are impaired. She expresses inappropriate judgment. She is inattentive   Results for orders placed or performed during the hospital encounter of 03/13/15 (from the past 48 hour(s))  Urinalysis, Routine w reflex microscopic     Status: Abnormal   Collection Time: 03/13/15  1:17 PM  Result Value Ref Range   Color, Urine AMBER (A) YELLOW    Comment: BIOCHEMICALS MAY BE AFFECTED BY COLOR   APPearance CLEAR CLEAR   Specific Gravity, Urine 1.026 1.005 - 1.030   pH 6.0 5.0 - 8.0   Glucose, UA NEGATIVE NEGATIVE mg/dL   Hgb urine dipstick NEGATIVE NEGATIVE   Bilirubin Urine SMALL (A) NEGATIVE   Ketones, ur NEGATIVE NEGATIVE mg/dL   Protein, ur NEGATIVE NEGATIVE mg/dL   Nitrite NEGATIVE NEGATIVE   Leukocytes, UA NEGATIVE NEGATIVE    Comment: MICROSCOPIC NOT DONE ON URINES WITH NEGATIVE PROTEIN, BLOOD, LEUKOCYTES, NITRITE, OR GLUCOSE <1000 mg/dL.  Urine rapid drug screen (hosp performed)     Status: Abnormal   Collection Time: 03/13/15  1:17 PM  Result Value Ref Range   Opiates POSITIVE (A) NONE DETECTED   Cocaine NONE DETECTED NONE DETECTED   Benzodiazepines POSITIVE (A) NONE DETECTED   Amphetamines NONE DETECTED NONE DETECTED   Tetrahydrocannabinol NONE DETECTED NONE DETECTED   Barbiturates NONE DETECTED NONE DETECTED    Comment:        DRUG SCREEN FOR MEDICAL PURPOSES ONLY.  IF CONFIRMATION IS NEEDED FOR ANY PURPOSE, NOTIFY LAB WITHIN 5 DAYS.        LOWEST DETECTABLE LIMITS FOR URINE DRUG SCREEN Drug Class       Cutoff (ng/mL) Amphetamine      1000 Barbiturate      200 Benzodiazepine   379 Tricyclics       024 Opiates          300 Cocaine          300 THC              50   Comprehensive metabolic panel     Status: Abnormal   Collection Time:  03/13/15  1:40 PM  Result Value Ref Range   Sodium 143 135 - 145 mmol/L   Potassium 4.2 3.5 - 5.1 mmol/L   Chloride 106 101 - 111 mmol/L   CO2 21 (L) 22 - 32 mmol/L   Glucose, Bld 127 (H) 65 - 99 mg/dL   BUN 19 6 - 20 mg/dL   Creatinine, Ser 1.25 (H) 0.44 - 1.00 mg/dL   Calcium 9.0 8.9 - 10.3 mg/dL   Total Protein 7.1 6.5 - 8.1 g/dL   Albumin 3.5 3.5 - 5.0 g/dL   AST 46 (H) 15 - 41 U/L   ALT 24 14 - 54 U/L   Alkaline Phosphatase 69 38 - 126 U/L   Total Bilirubin 1.8 (H) 0.3 - 1.2 mg/dL   GFR calc non Af Amer 40 (L) >60 mL/min   GFR calc Af Amer 46 (L) >60 mL/min    Comment: (NOTE) The eGFR has been calculated  using the CKD EPI equation. This calculation has not been validated in all clinical situations. eGFR's persistently <60 mL/min signify possible Chronic Kidney Disease.    Anion gap 16 (H) 5 - 15  CBC with Differential     Status: Abnormal   Collection Time: 03/13/15  1:40 PM  Result Value Ref Range   WBC 9.2 4.0 - 10.5 K/uL   RBC 4.85 3.87 - 5.11 MIL/uL   Hemoglobin 15.1 (H) 12.0 - 15.0 g/dL   HCT 45.4 36.0 - 46.0 %   MCV 93.6 78.0 - 100.0 fL   MCH 31.1 26.0 - 34.0 pg   MCHC 33.3 30.0 - 36.0 g/dL   RDW 12.6 11.5 - 15.5 %   Platelets 259 150 - 400 K/uL   Neutrophils Relative % 69 %   Neutro Abs 6.3 1.7 - 7.7 K/uL   Lymphocytes Relative 22 %   Lymphs Abs 2.0 0.7 - 4.0 K/uL   Monocytes Relative 4 %   Monocytes Absolute 0.4 0.1 - 1.0 K/uL   Eosinophils Relative 5 %   Eosinophils Absolute 0.5 0.0 - 0.7 K/uL   Basophils Relative 0 %   Basophils Absolute 0.0 0.0 - 0.1 K/uL  CK     Status: None   Collection Time: 03/13/15  1:40 PM  Result Value Ref Range   Total CK 159 38 - 234 U/L  I-stat troponin, ED     Status: None   Collection Time: 03/13/15  1:45 PM  Result Value Ref Range   Troponin i, poc 0.00 0.00 - 0.08 ng/mL   Comment 3            Comment: Due to the release kinetics of cTnI, a negative result within the first hours of the onset of symptoms does not  rule out myocardial infarction with certainty. If myocardial infarction is still suspected, repeat the test at appropriate intervals.   Troponin I (q 6hr x 3)     Status: None   Collection Time: 03/13/15  6:49 PM  Result Value Ref Range   Troponin I <0.03 <0.031 ng/mL    Comment:        NO INDICATION OF MYOCARDIAL INJURY.   APTT     Status: None   Collection Time: 03/13/15  8:25 PM  Result Value Ref Range   aPTT 26 24 - 37 seconds  Protime-INR     Status: None   Collection Time: 03/13/15  8:25 PM  Result Value Ref Range   Prothrombin Time 15.0 11.6 - 15.2 seconds   INR 1.16 0.00 - 1.49  Glucose, capillary     Status: Abnormal   Collection Time: 03/13/15 10:17 PM  Result Value Ref Range   Glucose-Capillary 152 (H) 65 - 99 mg/dL  Glucose, capillary     Status: Abnormal   Collection Time: 03/14/15 12:27 AM  Result Value Ref Range   Glucose-Capillary 132 (H) 65 - 99 mg/dL   Comment 1 Notify RN    Comment 2 Document in Chart   Troponin I (q 6hr x 3)     Status: None   Collection Time: 03/14/15 12:39 AM  Result Value Ref Range   Troponin I <0.03 <0.031 ng/mL    Comment:        NO INDICATION OF MYOCARDIAL INJURY.   Hemoglobin A1c     Status: Abnormal   Collection Time: 03/14/15 12:39 AM  Result Value Ref Range   Hgb A1c MFr Bld 5.7 (H) 4.8 - 5.6 %  Comment: (NOTE)         Pre-diabetes: 5.7 - 6.4         Diabetes: >6.4         Glycemic control for adults with diabetes: <7.0    Mean Plasma Glucose 117 mg/dL    Comment: (NOTE) Performed At: [facility] [place], [place] [id number] Lindon Romp MD OE:[id number]   Lipid panel     Status: Abnormal   Collection Time: 03/14/15 12:39 AM  Result Value Ref Range   Cholesterol 176 0 - 200 mg/dL   Triglycerides 71 <150 mg/dL   HDL 50 >40 mg/dL   Total CHOL/HDL Ratio 3.5 RATIO   VLDL 14 0 - 40 mg/dL   LDL Cholesterol 112 (H) 0 - 99 mg/dL    Comment:        Total Cholesterol/HDL:CHD  Risk Coronary Heart Disease Risk Table                     Men   Women  1/2 Average Risk   3.4   3.3  Average Risk       5.0   4.4  2 X Average Risk   9.6   7.1  3 X Average Risk  23.4   11.0        Use the calculated Patient Ratio above and the CHD Risk Table to determine the patient's CHD Risk.        ATP III CLASSIFICATION (LDL):  <100     mg/dL   Optimal  100-129  mg/dL   Near or Above                    Optimal  130-159  mg/dL   Borderline  160-189  mg/dL   High  >190     mg/dL   Very High   Glucose, capillary     Status: Abnormal   Collection Time: 03/14/15  4:08 AM  Result Value Ref Range   Glucose-Capillary 111 (H) 65 - 99 mg/dL  Troponin I (q 6hr x 3)     Status: None   Collection Time: 03/14/15  6:57 AM  Result Value Ref Range   Troponin I <0.03 <0.031 ng/mL    Comment:        NO INDICATION OF MYOCARDIAL INJURY.   Glucose, capillary     Status: Abnormal   Collection Time: 03/14/15  7:40 AM  Result Value Ref Range   Glucose-Capillary 116 (H) 65 - 99 mg/dL  Glucose, capillary     Status: Abnormal   Collection Time: 03/14/15 11:55 AM  Result Value Ref Range   Glucose-Capillary 146 (H) 65 - 99 mg/dL  Glucose, capillary     Status: Abnormal   Collection Time: 03/14/15  4:56 PM  Result Value Ref Range   Glucose-Capillary 104 (H) 65 - 99 mg/dL  Glucose, capillary     Status: Abnormal   Collection Time: 03/15/15  8:32 AM  Result Value Ref Range   Glucose-Capillary 125 (H) 65 - 99 mg/dL  CBC     Status: Abnormal   Collection Time: 03/15/15  8:56 AM  Result Value Ref Range   WBC 10.6 (H) 4.0 - 10.5 K/uL   RBC 4.92 3.87 - 5.11 MIL/uL   Hemoglobin 15.2 (H) 12.0 - 15.0 g/dL   HCT 44.6 36.0 - 46.0 %   MCV 90.7 78.0 - 100.0 fL   MCH 30.9 26.0 - 34.0 pg  MCHC 34.1 30.0 - 36.0 g/dL   RDW 12.5 11.5 - 15.5 %   Platelets 302 150 - 400 K/uL   Ct Angio Head W/cm &/or Wo Cm  03/14/2015  CLINICAL DATA:  Altered mental status, increased lethargy and memory problems for  2 days. History of atrial fibrillation on Coumadin, now discontinued due to frequent falls. Poor historian. Assess acute stroke. EXAM: CT ANGIOGRAPHY HEAD AND NECK TECHNIQUE: Multidetector CT imaging of the head and neck was performed using the standard protocol during bolus administration of intravenous contrast. Multiplanar CT image reconstructions and MIPs were obtained to evaluate the vascular anatomy. Carotid stenosis measurements (when applicable) are obtained utilizing NASCET criteria, using the distal internal carotid diameter as the denominator. CONTRAST:  166m OMNIPAQUE IOHEXOL 350 MG/ML SOLN COMPARISON:  MRI of the brain March 13, 2015 FINDINGS: CTA NECK Aortic arch: Normal appearance of the thoracic arch, 2 vessel arch is a normal variant. The origins of the innominate, left Common carotid artery and subclavian artery are widely patent. Right carotid system: Common carotid artery is widely patent, coursing in a straight line fashion. Normal appearance of the carotid bifurcation without hemodynamically significant stenosis by NASCET criteria. Minimal eccentric calcific atherosclerosis. Normal appearance of the included internal carotid artery. Left carotid system: Common carotid artery is widely patent, coursing in a straight line fashion. Normal appearance of the carotid bifurcation without hemodynamically significant stenosis by NASCET criteria. Minimal eccentric calcific atherosclerosis. Normal appearance of the included internal carotid artery. Vertebral arteries:Left vertebral artery is dominant. Normal appearance of the vertebral arteries, which appear widely patent. Skeleton: No acute osseous process though bone windows have not been submitted. Moderate to severe degenerative change of the cervical spine without destructive bony lesions. Multiple absent teeth. Other neck: Soft tissues of the neck are nonacute though, not tailored for evaluation. CTA HEAD Anterior circulation: Normal appearance  of the cervical internal carotid arteries, petrous, cavernous and supra clinoid internal carotid arteries. Widely patent anterior communicating artery. Patent anterior and middle cerebral arteries. Mild stenosis LEFT M2 segment. Posterior circulation: Normal appearance of the vertebral arteries, vertebrobasilar junction and basilar artery, as well as main branch vessels. Occluded distal LEFT P1 segment. Moderate stenosis RIGHT P2 segment. No dissection, luminal irregularity, contrast extravasation or aneurysm within the anterior nor posterior circulation. No abnormal intracranial enhancement. IMPRESSION: CTA NECK:  Negative. CTA HEAD:  Occluded distal LEFT P1 segment. Moderate stenosis RIGHT P2 segment. Electronically Signed   By: CElon AlasM.D.   On: 03/14/2015 05:13   Dg Chest 2 View  03/13/2015  CLINICAL DATA:  82year old presenting with acute mental status changes and somnolence. Right upper extremity weakness. Current history of hypertension. Personal history of breast cancer. EXAM: CHEST  2 VIEW COMPARISON:  11/19/2013 and earlier. FINDINGS: Cardiac silhouette mildly to moderately enlarged. Thoracic aorta tortuous and atherosclerotic, unchanged. Hilar and mediastinal contours otherwise unremarkable. Mild diffuse interstitial pulmonary edema. Small bilateral pleural effusions. The no confluent airspace consolidation. Degenerative changes and DISH involving the thoracic spine. IMPRESSION: Mild CHF, with mild to moderate cardiomegaly, mild diffuse interstitial pulmonary edema and small bilateral pleural effusions. Electronically Signed   By: TEvangeline DakinM.D.   On: 03/13/2015 14:22   Ct Head Wo Contrast  03/13/2015  CLINICAL DATA:  Altered mental status. EXAM: CT HEAD WITHOUT CONTRAST TECHNIQUE: Contiguous axial images were obtained from the base of the skull through the vertex without intravenous contrast. COMPARISON:  12/18/2012 FINDINGS: Similar findings of advanced atrophy with sulcal  prominence centralized volume loss  with commensurate ex vacuo dilatation of the ventricular system. Extensive nearly confluent periventricular hypodensities are unchanged compatible microvascular ischemic disease. Old lacunar infarcts within the left side of the pons (image 9, series 3) as well as the bilateral insular cortices (image 16). Given extensive background parenchymal abnormalities, there is no CT evidence of superimposed acute large territory infarct. No intraparenchymal or extra-axial mass or hemorrhage. Unchanged size and configuration of the ventricles and basilar cisterns. No midline shift. Intracranial atherosclerosis. Limited visualization of the paranasal sinuses and mastoid air cells is normal. No air-fluid levels. Debris is noted within the bilateral external auditory canals. Regional soft tissues appear normal. No displaced calvarial fracture. IMPRESSION: Similar findings of advanced atrophy and microvascular ischemic disease without acute intracranial process. Electronically Signed   By: Sandi Mariscal M.D.   On: 03/13/2015 14:36   Ct Angio Neck W/cm &/or Wo/cm  03/14/2015  CLINICAL DATA:  Altered mental status, increased lethargy and memory problems for 2 days. History of atrial fibrillation on Coumadin, now discontinued due to frequent falls. Poor historian. Assess acute stroke. EXAM: CT ANGIOGRAPHY HEAD AND NECK TECHNIQUE: Multidetector CT imaging of the head and neck was performed using the standard protocol during bolus administration of intravenous contrast. Multiplanar CT image reconstructions and MIPs were obtained to evaluate the vascular anatomy. Carotid stenosis measurements (when applicable) are obtained utilizing NASCET criteria, using the distal internal carotid diameter as the denominator. CONTRAST:  172m OMNIPAQUE IOHEXOL 350 MG/ML SOLN COMPARISON:  MRI of the brain March 13, 2015 FINDINGS: CTA NECK Aortic arch: Normal appearance of the thoracic arch, 2 vessel arch is a  normal variant. The origins of the innominate, left Common carotid artery and subclavian artery are widely patent. Right carotid system: Common carotid artery is widely patent, coursing in a straight line fashion. Normal appearance of the carotid bifurcation without hemodynamically significant stenosis by NASCET criteria. Minimal eccentric calcific atherosclerosis. Normal appearance of the included internal carotid artery. Left carotid system: Common carotid artery is widely patent, coursing in a straight line fashion. Normal appearance of the carotid bifurcation without hemodynamically significant stenosis by NASCET criteria. Minimal eccentric calcific atherosclerosis. Normal appearance of the included internal carotid artery. Vertebral arteries:Left vertebral artery is dominant. Normal appearance of the vertebral arteries, which appear widely patent. Skeleton: No acute osseous process though bone windows have not been submitted. Moderate to severe degenerative change of the cervical spine without destructive bony lesions. Multiple absent teeth. Other neck: Soft tissues of the neck are nonacute though, not tailored for evaluation. CTA HEAD Anterior circulation: Normal appearance of the cervical internal carotid arteries, petrous, cavernous and supra clinoid internal carotid arteries. Widely patent anterior communicating artery. Patent anterior and middle cerebral arteries. Mild stenosis LEFT M2 segment. Posterior circulation: Normal appearance of the vertebral arteries, vertebrobasilar junction and basilar artery, as well as main branch vessels. Occluded distal LEFT P1 segment. Moderate stenosis RIGHT P2 segment. No dissection, luminal irregularity, contrast extravasation or aneurysm within the anterior nor posterior circulation. No abnormal intracranial enhancement. IMPRESSION: CTA NECK:  Negative. CTA HEAD:  Occluded distal LEFT P1 segment. Moderate stenosis RIGHT P2 segment. Electronically Signed   By: CElon AlasM.D.   On: 03/14/2015 05:13   Mr Brain Wo Contrast  03/13/2015  CLINICAL DATA:  TIA. Altered mental status and weakness for the past couple of days. EXAM: MRI HEAD WITHOUT CONTRAST TECHNIQUE: Multiplanar, multiecho pulse sequences of the brain and surrounding structures were obtained without intravenous contrast. COMPARISON:  Head CT 03/13/2015 FINDINGS: Some  sequences are moderately motion degraded. There is abnormal diffusion-weighted signal involving the mesial left temporal lobe/hippocampus as well as lateral aspect of the left thalamus with evidence of mildly reduced ADC. There is at most minimal associated edema/ T2 hyperintensity. There is moderate cerebral atrophy. A few chronic cerebral microhemorrhages are noted. Confluent T2 hyperintensities in the cerebral white matter bilaterally are nonspecific but compatible with extensive chronic small vessel ischemic disease. Chronic lacunar infarcts are noted in the right thalamus and deep cerebral white matter bilaterally. No mass, midline shift, or extra-axial fluid collection is seen. Prior bilateral cataract extraction is noted. Paranasal sinuses and mastoid air cells are clear. Major intracranial vascular flow voids are grossly preserved, although evaluation is limited by motion artifact and the left PCA is not well seen. IMPRESSION: 1. Small acute/early subacute infarcts in the left thalamus and mesial left temporal lobe. 2. Extensive chronic small vessel ischemic disease and cerebral atrophy. Electronically Signed   By: Logan Bores M.D.   On: 03/13/2015 18:09    Medical Problem List and Plan: 1.  Altered mental status/right hemiparesis secondary to left thalamic and left mesial temporal lobe infarct 2.  DVT Prophylaxis/Anticoagulation: Eliquis 3. Pain Management/chronic back pain: Voltaren Gel 4 times a day, Percocet 7.5-325 mg 1 tab every 8 hours as needed 4. Hypertension/atrial fibrillation. Monitor cardiac rate. Allow permissive  hypertension. Lopressor 12.5 mg twice a day, Cardizem CD 240 mg daily, ContinueEliquis  5. Neuropsych: This patient is capable of making decisions on her own behalf. 6. Skin/Wound Care: Routine skin checks 7. Fluids/Electrolytes/Nutrition: Routine I&O's with follow-up chemistries 8.Hyperlipidemia. Pravachol 9. History of breast cancer 2008.Aromasin 25 mg daily 9. Legally blind. She can see figures and objects.   Post Admission Physician Evaluation: 1. Functional deficits secondary  to left thalamic and left mesial temporal lobe infarct. 2. Patient is admitted to receive collaborative, interdisciplinary care between the physiatrist, rehab nursing staff, and therapy team. 3. Patient's level of medical complexity and substantial therapy needs in context of that medical necessity cannot be provided at a lesser intensity of care such as a SNF. 4. Patient has experienced substantial functional loss from his/her baseline which was documented above under the "Functional History" and "Functional Status" headings.  Judging by the patient's diagnosis, physical exam, and functional history, the patient has potential for functional progress which will result in measurable gains while on inpatient rehab.  These gains will be of substantial and practical use upon discharge  in facilitating mobility and self-care at the household level. 5. Physiatrist will provide 24 hour management of medical needs as well as oversight of the therapy plan/treatment and provide guidance as appropriate regarding the interaction of the two. 6. 24 hour rehab nursing will assist with bladder management, bowel management, safety, skin/wound care, disease management, medication administration, pain management and patient education and help integrate therapy concepts, techniques,education, etc. 7. PT will assess and treat for/with: Lower extremity strength, range of motion, stamina, balance, functional mobility, safety, adaptive techniques  and equipment, coping skills, pain control, stroke education.   Goals are: Min A. 8. OT will assess and treat for/with: ADL's, functional mobility, safety, upper extremity strength, adaptive techniques and equipment, ego support, and community reintegration.   Goals are: Min A. Therapy may proceed with showering this patient. 9. SLP will assess and treat for/with: Speech, language, cognitive processing.  Goals are: Min/ Mod A. 10. Case Management and Social Worker will assess and treat for psychological issues and discharge planning. 11. Team conference will be  held weekly to assess progress toward goals and to determine barriers to discharge. 12. Patient will receive at least 3 hours of therapy per day at least 5 days per week. 13. ELOS: 17-20 days.       14. Prognosis:  good   Delice Lesch, MD 03/15/2015

## 2015-03-16 NOTE — Discharge Summary (Signed)
Physician Discharge Summary  Rose Sullivan [id number] DOB: Nov 18, 1933 DOA: 03/13/2015  PCP: [name], MD  Admit date: 03/13/2015 Discharge date: 03/16/2015  Time spent: 40 minutes  Recommendations for Outpatient Follow-up:  1. Follow up with CIR MD.   Discharge Diagnoses:  Active Problems:   BLINDNESS, LEGAL, [place] DEFINITION   Essential hypertension   Atrial fibrillation (HCC)   LOW BACK PAIN, CHRONIC   Breast cancer metastasized to lung (HCC)   Acute encephalopathy   DM (diabetes mellitus) ([place])   Cerebral embolism with cerebral infarction   CKD (chronic kidney disease)   Acute CVA (cerebrovascular accident) ([place])   Altered mental status   Atrial fibrillation with RVR ([place])   Discharge Condition: Stable  Diet recommendation: heart healthy   Filed Weights   03/13/15 1852  Weight: 86.864 kg (191 lb 8 oz)    History of present illness:  Rose Sullivan is a 81 y.o. female has a past medical history significant for hypertension, hyperlipidemia, diabetes mellitus, chronic pain, is being brought by her daughter from home with a chief complaint of altered mental status and weakness for the past couple of days. Patient is taking Restoril every evening to sleep, she took a dose 2 days ago, and yesterday when she woke up the daughter felt like she was weaker than normal and more confused. Last night her daughter held her Restoril out of concern not to make her even more confused. At baseline, patient is alert and oriented 3, sometimes forgets to date but no other issues as far as she knows. Daughter denies any reported issues prior to yesterday, no known fevers, no cough or chest congestion or respiratory symptoms, no abdominal pain, nausea or vomiting or diarrhea, no dysuria. She reported mild chest discomfort yesterday but none since. She got concerned today when felt like patient was not returning to normal and called EMS. On EMS evaluation was felt like she was weak on one  side, however she thinks it was the left side, but she is not sure.  In the ED, patient is afebrile, hypertensive, she has slightly elevated creatinine which is at baseline, her troponin was 0. Her chest x-ray showed cardiomegaly with mild edema, small bilateral pleural effusion. She underwent a CT scan of the brain without any acute intracranial processes. TRH is asked for admission for altered mental status.  Hospital Course:   CVA -Patient presented with this and weakness, MRI showed small infarcts in the left thalamus and mesial left temporal lobe. -Started on Eliquis -EEG without seizure activity -2D echo EF 55-60% -A1C 5.7 -LDL 112,  discharge on pravastatin  Paroxysmal atrial fibrillation -In sinus on admission, developed A fib overnight 2/14, rates 130-140s on occasions, requiring IV diltiazem and IV Metoprolol. Asymptomatic -Patient is on Cardizem CD 240 mg, metoprolol 12.5 mg added to control the rate.  -CHA2DS2-VASc for at least 7 for HTN, DM, CHF and 2 points for each age and stroke, placed on Eliquis.     Hypertension - Allow permissive hypertension in the setting of CVA, reintroduce home medications within 4-5 days as blood pressure allows  Acute encephalopathy - Mild confusion on presentation, persistent today, this is likely due to her CVA, this is resolved.  Diabetes mellitus - Continue sliding scale  Acute on Chronic diastolic heart failure - Most recent 2-D echo was in 2011 showed grade on diastolic dysfunction with an EF of 60-65%. - repeat echo similar  History of metastasized breast cancer - apparently in remission, no metastases  on MRI of the brain.   Chest pain - Resolved, cardiac enzymes negative.   Procedures: 2-D echo done on 03/14/2015  Study Conclusions  - Left ventricle: The cavity size was normal. There was mild focal basal hypertrophy of the septum. Systolic function was normal. The estimated ejection fraction was in the range of 55%  to 60%. Wall motion was normal; there were no regional wall motion abnormalities. - Mitral valve: There was mild regurgitation. - Pulmonic valve: There was trivial regurgitation.  Consultations:  Neuro  Discharge Exam: Filed Vitals:   03/15/15 2200 03/16/15 0612  BP: 150/90 157/76  Pulse: 94 70  Temp: 98.2 F (36.8 C) 98.1 F (36.7 C)  Resp:  14   General: Alert and awake, oriented x3, not in any acute distress. HEENT: anicteric sclera, pupils reactive to light and accommodation, EOMI CVS: S1-S2 clear, no murmur rubs or gallops Chest: clear to auscultation bilaterally, no wheezing, rales or rhonchi Abdomen: soft nontender, nondistended, normal bowel sounds, no organomegaly Extremities: no cyanosis, clubbing or edema noted bilaterally Neuro: Cranial nerves II-XII intact, no focal neurological deficits  Discharge Instructions   Discharge Instructions    Ambulatory referral to Neurology    Complete by:  As directed   This is a routine stroke follow up. An appointment is requested in 1 -2 months with carolyn Hassell Done NP.     Diet - low sodium heart healthy    Complete by:  As directed      Increase activity slowly    Complete by:  As directed           Current Discharge Medication List    START taking these medications   Details  apixaban (ELIQUIS) 5 MG TABS tablet Take 1 tablet (5 mg total) by mouth 2 (two) times daily. Qty: 60 tablet    metoprolol tartrate (LOPRESSOR) 25 MG tablet Take 0.5 tablets (12.5 mg total) by mouth 2 (two) times daily.    pravastatin (PRAVACHOL) 40 MG tablet Take 1 tablet (40 mg total) by mouth daily at 6 PM. Qty: 30 tablet      CONTINUE these medications which have NOT CHANGED   Details  diltiazem (CARDIZEM CD) 240 MG 24 hr capsule TAKE ONE CAPSULE BY MOUTH EVERY DAY Qty: 30 capsule, Refills: 6    glucose blood (ONE TOUCH ULTRA TEST) test strip Use as instructed once daily to check blood sugar.  ICD 10 code R73.02 Qty: 100 each,  Refills: 11   Associated Diagnoses: Impaired glucose tolerance    losartan (COZAAR) 50 MG tablet Take 50 mg by mouth daily.    oxyCODONE-acetaminophen (PERCOCET) 7.5-325 MG tablet Take 1 tablet by mouth every 8 (eight) hours as needed. Qty: 120 tablet, Refills: 0   Associated Diagnoses: Lumbar facet arthropathy; Primary osteoarthritis of left knee; Primary osteoarthritis of right hip; Primary osteoarthritis of right knee      STOP taking these medications     Lancets (ONETOUCH ULTRASOFT) lancets      diclofenac sodium (VOLTAREN) 1 % GEL      exemestane (AROMASIN) 25 MG tablet      temazepam (RESTORIL) 30 MG capsule        Allergies  Allergen Reactions  . Trazodone And Nefazodone Other (See Comments)    hallucinations   Follow-up Information    Follow up with Dennie Bible, NP In 1 month.   Specialty:  Family Medicine   Why:  Stroke Clinic, Office will call you with appointment date & time, for appt  with Nurse Practitioner for Dr. Sherilyn Cooter information:   [address] [place] [address] [phone]        The results of significant diagnostics from this hospitalization (including imaging, microbiology, ancillary and laboratory) are listed below for reference.    Significant Diagnostic Studies: Ct Angio Head W/cm &/or Wo Cm  03/14/2015  CLINICAL DATA:  Altered mental status, increased lethargy and memory problems for 2 days. History of atrial fibrillation on Coumadin, now discontinued due to frequent falls. Poor historian. Assess acute stroke. EXAM: CT ANGIOGRAPHY HEAD AND NECK TECHNIQUE: Multidetector CT imaging of the head and neck was performed using the standard protocol during bolus administration of intravenous contrast. Multiplanar CT image reconstructions and MIPs were obtained to evaluate the vascular anatomy. Carotid stenosis measurements (when applicable) are obtained utilizing NASCET criteria, using the distal internal carotid diameter  as the denominator. CONTRAST:  177mL OMNIPAQUE IOHEXOL 350 MG/ML SOLN COMPARISON:  MRI of the brain March 13, 2015 FINDINGS: CTA NECK Aortic arch: Normal appearance of the thoracic arch, 2 vessel arch is a normal variant. The origins of the innominate, left Common carotid artery and subclavian artery are widely patent. Right carotid system: Common carotid artery is widely patent, coursing in a straight line fashion. Normal appearance of the carotid bifurcation without hemodynamically significant stenosis by NASCET criteria. Minimal eccentric calcific atherosclerosis. Normal appearance of the included internal carotid artery. Left carotid system: Common carotid artery is widely patent, coursing in a straight line fashion. Normal appearance of the carotid bifurcation without hemodynamically significant stenosis by NASCET criteria. Minimal eccentric calcific atherosclerosis. Normal appearance of the included internal carotid artery. Vertebral arteries:Left vertebral artery is dominant. Normal appearance of the vertebral arteries, which appear widely patent. Skeleton: No acute osseous process though bone windows have not been submitted. Moderate to severe degenerative change of the cervical spine without destructive bony lesions. Multiple absent teeth. Other neck: Soft tissues of the neck are nonacute though, not tailored for evaluation. CTA HEAD Anterior circulation: Normal appearance of the cervical internal carotid arteries, petrous, cavernous and supra clinoid internal carotid arteries. Widely patent anterior communicating artery. Patent anterior and middle cerebral arteries. Mild stenosis LEFT M2 segment. Posterior circulation: Normal appearance of the vertebral arteries, vertebrobasilar junction and basilar artery, as well as main branch vessels. Occluded distal LEFT P1 segment. Moderate stenosis RIGHT P2 segment. No dissection, luminal irregularity, contrast extravasation or aneurysm within the anterior nor  posterior circulation. No abnormal intracranial enhancement. IMPRESSION: CTA NECK:  Negative. CTA HEAD:  Occluded distal LEFT P1 segment. Moderate stenosis RIGHT P2 segment. Electronically Signed   By: Elon Alas M.D.   On: 03/14/2015 05:13   Dg Chest 2 View  03/13/2015  CLINICAL DATA:  82 year old presenting with acute mental status changes and somnolence. Right upper extremity weakness. Current history of hypertension. Personal history of breast cancer. EXAM: CHEST  2 VIEW COMPARISON:  11/19/2013 and earlier. FINDINGS: Cardiac silhouette mildly to moderately enlarged. Thoracic aorta tortuous and atherosclerotic, unchanged. Hilar and mediastinal contours otherwise unremarkable. Mild diffuse interstitial pulmonary edema. Small bilateral pleural effusions. The no confluent airspace consolidation. Degenerative changes and DISH involving the thoracic spine. IMPRESSION: Mild CHF, with mild to moderate cardiomegaly, mild diffuse interstitial pulmonary edema and small bilateral pleural effusions. Electronically Signed   By: Evangeline Dakin M.D.   On: 03/13/2015 14:22   Ct Head Wo Contrast  03/13/2015  CLINICAL DATA:  Altered mental status. EXAM: CT HEAD WITHOUT CONTRAST TECHNIQUE: Contiguous axial images were  obtained from the base of the skull through the vertex without intravenous contrast. COMPARISON:  12/18/2012 FINDINGS: Similar findings of advanced atrophy with sulcal prominence centralized volume loss with commensurate ex vacuo dilatation of the ventricular system. Extensive nearly confluent periventricular hypodensities are unchanged compatible microvascular ischemic disease. Old lacunar infarcts within the left side of the pons (image 9, series 3) as well as the bilateral insular cortices (image 16). Given extensive background parenchymal abnormalities, there is no CT evidence of superimposed acute large territory infarct. No intraparenchymal or extra-axial mass or hemorrhage. Unchanged size and  configuration of the ventricles and basilar cisterns. No midline shift. Intracranial atherosclerosis. Limited visualization of the paranasal sinuses and mastoid air cells is normal. No air-fluid levels. Debris is noted within the bilateral external auditory canals. Regional soft tissues appear normal. No displaced calvarial fracture. IMPRESSION: Similar findings of advanced atrophy and microvascular ischemic disease without acute intracranial process. Electronically Signed   By: Sandi Mariscal M.D.   On: 03/13/2015 14:36   Ct Angio Neck W/cm &/or Wo/cm  03/14/2015  CLINICAL DATA:  Altered mental status, increased lethargy and memory problems for 2 days. History of atrial fibrillation on Coumadin, now discontinued due to frequent falls. Poor historian. Assess acute stroke. EXAM: CT ANGIOGRAPHY HEAD AND NECK TECHNIQUE: Multidetector CT imaging of the head and neck was performed using the standard protocol during bolus administration of intravenous contrast. Multiplanar CT image reconstructions and MIPs were obtained to evaluate the vascular anatomy. Carotid stenosis measurements (when applicable) are obtained utilizing NASCET criteria, using the distal internal carotid diameter as the denominator. CONTRAST:  173mL OMNIPAQUE IOHEXOL 350 MG/ML SOLN COMPARISON:  MRI of the brain March 13, 2015 FINDINGS: CTA NECK Aortic arch: Normal appearance of the thoracic arch, 2 vessel arch is a normal variant. The origins of the innominate, left Common carotid artery and subclavian artery are widely patent. Right carotid system: Common carotid artery is widely patent, coursing in a straight line fashion. Normal appearance of the carotid bifurcation without hemodynamically significant stenosis by NASCET criteria. Minimal eccentric calcific atherosclerosis. Normal appearance of the included internal carotid artery. Left carotid system: Common carotid artery is widely patent, coursing in a straight line fashion. Normal appearance of  the carotid bifurcation without hemodynamically significant stenosis by NASCET criteria. Minimal eccentric calcific atherosclerosis. Normal appearance of the included internal carotid artery. Vertebral arteries:Left vertebral artery is dominant. Normal appearance of the vertebral arteries, which appear widely patent. Skeleton: No acute osseous process though bone windows have not been submitted. Moderate to severe degenerative change of the cervical spine without destructive bony lesions. Multiple absent teeth. Other neck: Soft tissues of the neck are nonacute though, not tailored for evaluation. CTA HEAD Anterior circulation: Normal appearance of the cervical internal carotid arteries, petrous, cavernous and supra clinoid internal carotid arteries. Widely patent anterior communicating artery. Patent anterior and middle cerebral arteries. Mild stenosis LEFT M2 segment. Posterior circulation: Normal appearance of the vertebral arteries, vertebrobasilar junction and basilar artery, as well as main branch vessels. Occluded distal LEFT P1 segment. Moderate stenosis RIGHT P2 segment. No dissection, luminal irregularity, contrast extravasation or aneurysm within the anterior nor posterior circulation. No abnormal intracranial enhancement. IMPRESSION: CTA NECK:  Negative. CTA HEAD:  Occluded distal LEFT P1 segment. Moderate stenosis RIGHT P2 segment. Electronically Signed   By: Elon Alas M.D.   On: 03/14/2015 05:13   Mr Brain Wo Contrast  03/13/2015  CLINICAL DATA:  TIA. Altered mental status and weakness for the past couple of days.  EXAM: MRI HEAD WITHOUT CONTRAST TECHNIQUE: Multiplanar, multiecho pulse sequences of the brain and surrounding structures were obtained without intravenous contrast. COMPARISON:  Head CT 03/13/2015 FINDINGS: Some sequences are moderately motion degraded. There is abnormal diffusion-weighted signal involving the mesial left temporal lobe/hippocampus as well as lateral aspect of the  left thalamus with evidence of mildly reduced ADC. There is at most minimal associated edema/ T2 hyperintensity. There is moderate cerebral atrophy. A few chronic cerebral microhemorrhages are noted. Confluent T2 hyperintensities in the cerebral white matter bilaterally are nonspecific but compatible with extensive chronic small vessel ischemic disease. Chronic lacunar infarcts are noted in the right thalamus and deep cerebral white matter bilaterally. No mass, midline shift, or extra-axial fluid collection is seen. Prior bilateral cataract extraction is noted. Paranasal sinuses and mastoid air cells are clear. Major intracranial vascular flow voids are grossly preserved, although evaluation is limited by motion artifact and the left PCA is not well seen. IMPRESSION: 1. Small acute/early subacute infarcts in the left thalamus and mesial left temporal lobe. 2. Extensive chronic small vessel ischemic disease and cerebral atrophy. Electronically Signed   By: Logan Bores M.D.   On: 03/13/2015 18:09    Microbiology: No results found for this or any previous visit (from the past 240 hour(s)).   Labs: Basic Metabolic Panel:  Recent Labs Lab 03/13/15 1340 03/15/15 0856  NA 143 141  K 4.2 3.4*  CL 106 107  CO2 21* 20*  GLUCOSE 127* 139*  BUN 19 11  CREATININE 1.25* 0.90  CALCIUM 9.0 8.9   Liver Function Tests:  Recent Labs Lab 03/13/15 1340  AST 46*  ALT 24  ALKPHOS 69  BILITOT 1.8*  PROT 7.1  ALBUMIN 3.5   No results for input(s): LIPASE, AMYLASE in the last 168 hours. No results for input(s): AMMONIA in the last 168 hours. CBC:  Recent Labs Lab 03/13/15 1340 03/15/15 0856  WBC 9.2 10.6*  NEUTROABS 6.3  --   HGB 15.1* 15.2*  HCT 45.4 44.6  MCV 93.6 90.7  PLT 259 302   Cardiac Enzymes:  Recent Labs Lab 03/13/15 1340 03/13/15 1849 03/14/15 0039 03/14/15 0657  CKTOTAL 159  --   --   --   TROPONINI  --  <0.03 <0.03 <0.03   BNP: BNP (last 3 results) No results for  input(s): BNP in the last 8760 hours.  ProBNP (last 3 results) No results for input(s): PROBNP in the last 8760 hours.  CBG:  Recent Labs Lab 03/15/15 0832 03/15/15 1232 03/15/15 1655 03/15/15 2346 03/16/15 0749  GLUCAP 125* 121* 96 148* 100*       Signed:  Cianna Kasparian A MD.  [facility] 03/16/2015, 12:10 PM

## 2015-03-16 NOTE — Progress Notes (Signed)
Retta Diones, RN Rehab Admission Coordinator Signed Physical Medicine and Rehabilitation PMR Pre-admission 03/16/2015 9:05 AM  Related encounter: ED to Hosp-Admission (Current) from 03/13/2015 in [place] Collapse All   PMR Admission Coordinator Pre-Admission Assessment  Patient: Rose Sullivan is an 82 y.o., female MRN: [id number] DOB: November 05, 1933 Height: 5' (152.4 cm) Weight: 86.864 kg (191 lb 8 oz)  Insurance Information HMO: No PPO: PCP: IPA: 80/20: OTHER:  PRIMARY: Medicare A/B Policy#: [id number] Subscriber: Rose Sullivan CM Name: Phone#: Fax#:  Pre-Cert#: Employer: Retired Benefits: Phone #: Name: Checked in [place]. Date: 03/29/80 Deduct: $1316 Out of Pocket Max: none Life Max: unlimited CIR: 100% SNF: 100 days Outpatient: 80% Co-Pay: 20% Home Health: 100% Co-Pay: none DME: 80% Co-Pay: 20% Providers: patient's choice  SECONDARY: Medicaid of [place] Policy#: [id number] Subscriber: Rose Sullivan CM Name: Phone#: Fax#:  Pre-Cert#: Employer: Retired Benefits: [phone] Phone #: [phone] Name: Automated Eff. 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 right handed female with history of hypertension, hyperlipidemia, chronic back pain followed at the pain clinic by Dr.Swartz, legally blind and atrial fibrillation. By report patient lives with children as well as a personal care attendant who provide assistance as needed. Patient states that she needed help with all ADLs prior to admission. Noted baseline memory impairments. One level home with ramped entrance. Ambulated household distances with a walker. Presented 03/13/2015 with altered mental status, increasing lethargy and progressive weakness over the previous couple of days as well as memory problems. MRI of the brain showed small acute or early subacute infarct left thalamus and mesial left temporal lobe. Extensive chronic small vessel disease. Patient did not receive TPA. CTA of neck negative. CTA of the head showed occluded distal left P1 segment. Echocardiogram with ejection fraction of 60% no wall motion abnormalities. EEG was negative. Blood pressure heart rate monitored with heart rate 130s-140s on occasion and asymptomatic as patient remains on Cardizem and Lopressor was added. Neurology consulted maintained on Eliquis for CVA prophylaxis with history of atrial fibrillation. Regular diet consistency. Physical therapy evaluation completed with recommendations of physical medicine rehabilitation consult.   Total: 7=NIH  Past Medical History  Past Medical History  Diagnosis Date  . GLUCOSE INTOLERANCE 02/11/2007  . HYPERLIPIDEMIA 02/11/2007  . BLINDNESS, [place], [place] DEFINITION 11/25/2006  . Other specified forms of hearing loss 09/05/2009  . HYPERTENSION 02/11/2007  . Atrial fibrillation ([place]) 03/07/2009  . GERD 02/11/2007  . LOW BACK PAIN, CHRONIC 11/25/2006  . BACK PAIN 12/10/2007  . Pain in Soft Tissues of Limb 02/11/2007  . OSTEOPENIA 02/11/2007  . FATIGUE 12/10/2007  . GAIT DISTURBANCE 12/10/2007  . DYSPNEA  03/18/2009  . Cough 03/07/2009  . CHEST PAIN 12/10/2007  . Abdominal pain, left lower quadrant 04/01/2007  . BREAST CANCER, HX OF 11/25/2006  . Nocturia 05/18/2010  . Hypersomnia 05/18/2010  . Impaired glucose tolerance 05/18/2010  . Allergic rhinitis, cause unspecified 07/23/2012  . Anxiety state, unspecified 02/04/2013  . Cancer [facility])     Family History  family history includes COPD in her brother; Colon cancer in her father; Hypertension in her brother; Lung cancer in her brother; Stroke in her mother.  Prior Rehab/Hospitalizations: Was receiving PT/OT 2 X a week through [facility], last time Thursday prior to admission.  Has the patient had major surgery during 100 days prior to admission? No  Current Medications   Current facility-administered medications:  . stroke: mapping our early stages of recovery book, , Does not apply, Once, Costin M Gherghe, MD . apixaban (ELIQUIS) tablet 5 mg, 5 mg, Oral, BID, Romona Curls, RPH, 5 mg at 03/15/15 2237 . diclofenac sodium (VOLTAREN) 1 % transdermal gel 2 g, 2 g, Topical, QID, Caren Griffins, MD, 2 g at 03/14/15 2152 . diltiazem (CARDIZEM CD) 24 hr capsule 240 mg, 240 mg, Oral, Daily, Caren Griffins, MD, 240 mg at 03/15/15 1050 . exemestane (AROMASIN) tablet 25 mg, 25 mg, Oral, Daily, Caren Griffins, MD, 25 mg at 03/15/15 1050 . hydrALAZINE (APRESOLINE) injection 5 mg, 5 mg, Intravenous, Q6H PRN, Fransico Meadow, PA-C, 5 mg at 03/15/15 0243 . Influenza vac split quadrivalent PF (FLUARIX) injection 0.5 mL, 0.5 mL, Intramuscular, Tomorrow-1000, Costin Karlyne Greenspan, MD . insulin aspart (novoLOG) injection 0-9 Units, 0-9 Units, Subcutaneous, TID WC, Costin Karlyne Greenspan, MD, 1 Units at 03/15/15 1315 . metoprolol (LOPRESSOR) injection 5 mg, 5 mg, Intravenous, Q5 min PRN, Fransico Meadow, PA-C, 5 mg at 03/15/15 [id number] . metoprolol tartrate (LOPRESSOR) tablet 12.5 mg, 12.5 mg, Oral, BID, Caren Griffins, MD, 12.5 mg at  03/15/15 2237 . oxyCODONE-acetaminophen (PERCOCET) 7.5-325 MG per tablet 1 tablet, 1 tablet, Oral, Q8H PRN, Caren Griffins, MD, 1 tablet at 03/13/15 2343 . pneumococcal 23 valent vaccine (PNU-IMMUNE) injection 0.5 mL, 0.5 mL, Intramuscular, Tomorrow-1000, Costin Karlyne Greenspan, MD . pravastatin (PRAVACHOL) tablet 40 mg, 40 mg, Oral, q1800, Caren Griffins, MD, 40 mg at 03/15/15 1721 . senna-docusate (Senokot-S) tablet 1 tablet, 1 tablet, Oral, QHS PRN, Caren Griffins, MD, 1 tablet at 03/13/15 2009 . temazepam (RESTORIL) capsule 7.5 mg, 7.5 mg, Oral, QHS PRN, Caren Griffins, MD  Patients Current Diet: Diet heart healthy/carb modified Room service appropriate?: Yes; Fluid consistency:: Thin  Precautions / Restrictions Precautions Precautions: Fall Restrictions Weight Bearing Restrictions: No   Has the patient had 2 or more falls or a fall with injury in the past year?No. Had one fall this past Sunday with no injury.  Prior Activity Level Household: Went out to MD appointments 2-3 X a month.  Home Assistive Devices / Equipment Home Assistive Devices/Equipment: None Home Equipment: Walker - 2 wheels, Wheelchair - manual  Prior Device Use: Indicate devices/aids used by the patient prior to current illness, exacerbation or injury? Walker  Prior Functional Level Prior Function Level of Independence: Needs assistance Gait / Transfers Assistance Needed: Only amb household distances with walker ADL's / Homemaking Assistance Needed: Pt reports she could B/D herself but Sullivan helps her sometimes  Self Care: Did the patient need help bathing, dressing, using the toilet or eating? Needed some help  Indoor Mobility: Did the patient need assistance with walking from room to room (with or without device)? Needed some help  Stairs: Did the patient need assistance with internal or external stairs (with or without device)? Needed some help  Functional Cognition: Did the patient need  help planning regular tasks such as shopping or remembering to take medications? Needed some help  Current Functional Level Cognition  Arousal/Alertness: Awake/alert Overall Cognitive Status: Impaired/Different from  baseline Current Attention Level: Sustained Orientation Level: Oriented to person, Disoriented to place, Disoriented to time, Disoriented to situation Following Commands: Follows one step commands with increased time Safety/Judgement: Decreased awareness of safety, Decreased awareness of deficits Attention: Sustained Sustained Attention: Impaired Sustained Attention Impairment: Verbal basic, Functional basic Memory: Impaired Memory Impairment: Storage deficit, Retrieval deficit, Decreased recall of new information Awareness: Impaired Awareness Impairment: Emergent impairment Problem Solving: Impaired Problem Solving Impairment: Verbal basic, Functional basic Executive Function: (all impaired due to lower level deficits ) Safety/Judgment: Impaired   Extremity Assessment (includes Sensation/Coordination)  Upper Extremity Assessment: RUE deficits/detail, LUE deficits/detail RUE Deficits / Details: decreased coordination; ataxia RUE Sensation: decreased proprioception RUE Coordination: decreased fine motor, decreased gross motor LUE Deficits / Details: decreased coordination (but not as bad as RUE), ataxia LUE Sensation: decreased proprioception LUE Coordination: decreased gross motor, decreased fine motor  Lower Extremity Assessment: RLE deficits/detail RLE Deficits / Details: Grossly 3/5 but with ataxia and apraxia    ADLs  Overall ADL's : Needs assistance/impaired Eating/Feeding: Minimal assistance, Sitting (due to legally blind and ataxia of UEs) Grooming: Oral care, Brushing hair (supported sitting) Grooming Details (indicate cue type and reason): Max A for brushing teeth (could not open toothpaste, nor apply toothpaste to toothbrush) difficulty with hand  to mouth (due to ataxia)-once toothbrush in mouth she could brush teeth, but could not manage cup to spit and and wipe mouth. Combing hair (Mod A) difficulty due to apraxia Upper Body Bathing: Moderate assistance (supported sitting) Lower Body Bathing: Total assistance (Max A sit<>stand) Upper Body Dressing : Total assistance (supported sitting) Lower Body Dressing: Total assistance (Max A sit<>stand)    Mobility  Overal bed mobility: Needs Assistance Bed Mobility: Rolling, Supine to Sit Rolling: Min assist, Mod assist (required min for rolling R and mod assist for rolling L. Pt performed multiple bouts of rolling for perianal care post found in urine from incontinence. Pt reports she is unaware of incontinence. ) Sidelying to sit: Mod assist General bed mobility comments: Pt required cues for hand placement and trunk control to improve technique and ease during transitions. Pt required min assist to maintain sitting balance edge of bed with noticeable LOB forward.     Transfers  Overall transfer level: Needs assistance Equipment used: 1 person hand held assist Transfer via [place]: Stedy Transfers: Sit to/from Stand (required sit to stand from edge of bed to stedy and stedy to standing for transfer to chair. ) Sit to Stand: Mod assist, +2 physical assistance Stand pivot transfers: +2 physical assistance, Mod assist General transfer comment: Pt remains to require assist to boost hips from sitting to 3/4 standing trial to place stedy for stand pivot from bed to chair. Pt required max VCs with tactile cues for hand placement to ascend from seated surface x 2 bouts. Pt tolerated transfer well and presents with improved strength in R UE. Pt remains to require assist for R foot placement onto foot plate of stedy.     Ambulation / Gait / Stairs / Wheelchair Mobility  Ambulation/Gait Ambulation/Gait assistance: (remains unsafe at this time. )    Posture / Balance  Dynamic Sitting Balance Sitting balance - Comments: Pt requires one UE A to stay foreward stting in recliner Balance Overall balance assessment: Needs assistance Sitting-balance support: Feet supported, Single extremity supported Sitting balance-Leahy Scale: Poor Sitting balance - Comments: Pt requires one UE A to stay foreward stting in recliner Postural control: Posterior lean Standing balance support: Bilateral upper extremity supported Standing  balance-Leahy Scale: Zero Standing balance comment: Stood with +2 assist with walker or Stedy    Special needs/care consideration BiPAP/CPAP No CPM No Continuous Drip IV No Dialysis No  Life Vest No Oxygen No Special Bed No Trach Size No Wound Vac (area) No  Skin Has moles on her back. Skin itches.  Bowel mgmt: Last BM 03/15/15 Bladder mgmt: Wears depends at home for urgency, stress incontinence Diabetic mgmt No    Previous Home Environment Living Arrangements: Children Lives With: Family, Sullivan Available Help at Discharge: Family, Available 24 hours/day Type of Home: House Home Layout: One level Home Access: Ramped entrance Bathroom Shower/Tub: Gaffer, [place]: No  Discharge Living Setting Plans for Discharge Living Setting: House, Lives with (comment) (Lives with Sullivan, another Sullivan and granddaughter.) Type of Home at Discharge: House Discharge Home Layout: One level Discharge Home Access: [place] entrance Does the patient have any problems obtaining your medications?: No  Social/Family/Support Systems Patient Roles: Parent (Has 2 daughters and a granddaughter.) Contact Information: Rose Sullivan - Sullivan Anticipated Caregiver: Sullivan Anticipated Caregiver's Contact Information: Rose Sullivan - Sullivan - [phone] Ability/Limitations of Caregiver: Sullivan works from her home, can provided supervision and light assistance Caregiver  Availability: Other (Comment) (Close to 24/7 support.) Discharge Plan Discussed with Primary Caregiver: Yes Is Caregiver In Agreement with Plan?: Yes Does Caregiver/Family have Issues with Lodging/Transportation while Pt is in Rehab?: No  Goals/Additional Needs Patient/Family Goal for Rehab: PT/OT/ST min assist goals Expected length of stay: 15-18 days Cultural Considerations: [employer] Dietary Needs: Heart healthy, carb mod, thin liquids Equipment Needs: TBD Pt/Family Agrees to Admission and willing to participate: Yes Program Orientation Provided & Reviewed with Pt/Caregiver Including Roles & Responsibilities: Yes  Decrease burden of Care through IP rehab admission: N/A  Possible need for SNF placement upon discharge: Not planned  Patient Condition: This patient's medical and functional status has changed since the consult dated: 03/14/15 in which the Rehabilitation Physician determined and documented that the patient's condition is appropriate for intensive rehabilitative care in an inpatient rehabilitation facility. See "History of Present Illness" (above) for medical update. Functional changes are: Currently requiring mod assist for stand pivot transfers. Patient's medical and functional status update has been discussed with the Rehabilitation physician and patient remains appropriate for inpatient rehabilitation. Will admit to inpatient rehab today.  Preadmission Screen Completed By: Retta Diones, 03/16/2015 10:26 AM ______________________________________________________________________  Discussed status with Dr. Posey Pronto on 03/16/15 at 1027 and received telephone approval for admission today.  Admission Coordinator: Retta Diones, time1027/Date02/15/17          Cosigned by: Ankit Lorie Phenix, MD at 03/16/2015 10:47 AM  Revision History     Date/Time User Provider Type Action   03/16/2015 10:47 AM Ankit Lorie Phenix, MD Physician Cosign   03/16/2015 10:27 AM  Retta Diones, RN Rehab Admission Coordinator Sign

## 2015-03-16 NOTE — Progress Notes (Signed)
Rehab admissions - I have a rehab bed available today.  Will admit to acute inpatient rehab today.  Call me for questions.  [phone]

## 2015-03-16 NOTE — Progress Notes (Signed)
Physical Therapy Treatment Patient Details Name: Rose Sullivan MRN: [id number] DOB: 1934-01-21 Today's Date: 03/16/2015    History of Present Illness Pt adm with lethargy and altered mental status. 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 PMH - legally blind (since birth per pt), DM, HTN, a-fib    PT Comments    Pt performed increased gait distance to 22 ft with +1/+2 mod assist.  Pt performed standing trial with RW with +2 mod/min assist.  Pt lethargic on arrival but after sitting up pt remains alert.  Remains to require max cues for safety.    Follow Up Recommendations  CIR     Equipment Recommendations  None recommended by PT    Recommendations for Other Services Rehab consult     Precautions / Restrictions Precautions Precautions: Fall Restrictions Weight Bearing Restrictions: No    Mobility  Bed Mobility Overal bed mobility: Needs Assistance Bed Mobility: Rolling;Supine to Sit Rolling: Mod assist Sidelying to sit: Mod assist       General bed mobility comments: Pt remains to require cues for hand placement for advancement to edge of bed.  PTA used chux pad to advance to edge of bed to improve sitting balance.  Pt sat edge of bed with delayed righting requiring min assist with max verbal cues to maintain midline.    Transfers Overall transfer level: Needs assistance Equipment used: Rolling walker (2 wheeled) Transfers: Sit to/from [facility] Sit to Stand: Mod assist;+2 physical assistance Stand pivot transfers: Mod assist;+2 physical assistance       General transfer comment: Pt used stedy with +2 mod assist to stand from bed into position on stedy.  Pt required decreased assist on secondary boost from stedy requiring +2 min assist.  After seated in chair pt performed additonal sit to stand to RW with good quad control and use of UEs on RW.    Ambulation/Gait Ambulation/Gait assistance: Mod assist;+2 physical assistance  (initially +2 assist, as gait progressed required +1 mod assist.  ) Ambulation Distance (Feet): 22 Feet Assistive device: Rolling walker (2 wheeled) Gait Pattern/deviations: Step-through pattern;Shuffle;Decreased stride length;Trunk flexed     General Gait Details: Pt required cues for erect posture, weight shifting to R and L and increasing B step length.     Stairs            Wheelchair Mobility    Modified Rankin (Stroke Patients Only)       Balance     Sitting balance-Leahy Scale: Fair       Standing balance-Leahy Scale: Poor                      Cognition Arousal/Alertness: Lethargic Behavior During Therapy: WFL for tasks assessed/performed Overall Cognitive Status: Within Functional Limits for tasks assessed Area of Impairment: Safety/judgement;Problem solving;Following commands;Attention   Current Attention Level: Sustained Memory: Decreased short-term memory Following Commands: Follows one step commands with increased time     Problem Solving: Slow processing;Decreased initiation;Difficulty sequencing;Requires verbal cues;Requires tactile cues      Exercises      General Comments        Pertinent Vitals/Pain Pain Assessment: No/denies pain    Home Living                      Prior Function            PT Goals (current goals can now be found in the care plan section) Acute Rehab PT Goals  Patient Stated Goal: To be able to go home and take care of myself Potential to Achieve Goals: Good Progress towards PT goals: Progressing toward goals    Frequency  Min 4X/week    PT Plan      Co-evaluation             End of Session Equipment Utilized During Treatment: Gait belt Activity Tolerance: Patient tolerated treatment well Patient left: in chair;with call bell/phone within reach;with chair alarm set     Time: [id number] PT Time Calculation (min) (ACUTE ONLY): 27 min  Charges:  $Gait Training: 8-22  mins $Therapeutic Activity: 8-22 mins                    G Codes:      Cristela Blue 04-13-2015, 4:30 PM  Governor Rooks, PTA pager [phone]

## 2015-03-16 NOTE — PMR Pre-admission (Signed)
PMR Admission Coordinator Pre-Admission Assessment  Patient: Rose Sullivan is an 82 y.o., female MRN: [id number] DOB: 07/11/1933 Height: 5' (152.4 cm) Weight: 86.864 kg (191 lb 8 oz)              Insurance Information HMO: No    PPO:       PCP:       IPA:       80/20:       OTHER:   PRIMARY: Medicare A/B      Policy#: [id number]      Subscriber: Conni Elliot CM Name:        Phone#:       Fax#:   Pre-Cert#:        Employer: Retired Benefits:  Phone #:       Name: Checked in [place]. Date:  03/29/80     Deduct: $1316      Out of Pocket Max: none      Life Max: unlimited CIR: 100%      SNF: 100 days Outpatient: 80%     Co-Pay: 20% Home Health: 100%      Co-Pay: none DME: 80%     Co-Pay: 20% Providers: patient's choice  SECONDARY:  Medicaid of [place]      Policy#: [id number]      Subscriber: Conni Elliot CM Name:        Phone#:       Fax#:   Pre-Cert#:        Employer: Retired Benefits: [id number]     Phone #: [phone]     Name: Automated Eff. Date: Eligible 03/16/15 under coverage code [id number]      Deduct:        Out of Pocket Max:        Life Max:   CIR:        SNF:   Outpatient:       Co-Pay:   Home Health:        Co-Pay:   DME:       Co-Pay:    Emergency Contact Information Contact Information    Name Relation Home Work Mobile   Wegman,Angela Daughter [phone]     Alla German Daughter [phone]  [phone]     Current Medical History  Patient Admitting Diagnosis: Left thalamic and left medial temporal lobe infarct    History of Present Illness: A 82 y.o. right handed female with history of hypertension, hyperlipidemia, chronic back pain followed at the pain clinic by Dr.Swartz, legally blind and atrial fibrillation. By report patient lives with children as well as a personal care attendant who provide assistance as needed. Patient states that she needed help with all ADLs prior to admission. Noted baseline memory impairments. One level home with  ramped entrance. Ambulated household distances with a walker. Presented 03/13/2015 with altered mental status, increasing lethargy and progressive weakness over the previous couple of days as well as memory problems. MRI of the brain showed small acute or early subacute infarct left thalamus and mesial left temporal lobe. Extensive chronic small vessel disease. Patient did not receive TPA. CTA of neck negative. CTA of the head showed occluded distal left P1 segment. Echocardiogram with ejection fraction of 60% no wall motion abnormalities. EEG was negative. Blood pressure heart rate monitored with heart rate 130s-140s on occasion and asymptomatic as patient remains on Cardizem and Lopressor was added. Neurology consulted maintained on Eliquis for CVA prophylaxis with history of atrial fibrillation. Regular diet consistency. Physical therapy evaluation completed with  recommendations of physical medicine rehabilitation consult.    Total: 7=NIH  Past Medical History  Past Medical History  Diagnosis Date  . GLUCOSE INTOLERANCE 02/11/2007  . HYPERLIPIDEMIA 02/11/2007  . BLINDNESS, [place], [place] DEFINITION 11/25/2006  . Other specified forms of hearing loss 09/05/2009  . HYPERTENSION 02/11/2007  . Atrial fibrillation ([place]) 03/07/2009  . GERD 02/11/2007  . LOW BACK PAIN, CHRONIC 11/25/2006  . BACK PAIN 12/10/2007  . Pain in Soft Tissues of Limb 02/11/2007  . OSTEOPENIA 02/11/2007  . FATIGUE 12/10/2007  . GAIT DISTURBANCE 12/10/2007  . DYSPNEA 03/18/2009  . Cough 03/07/2009  . CHEST PAIN 12/10/2007  . Abdominal pain, left lower quadrant 04/01/2007  . BREAST CANCER, HX OF 11/25/2006  . Nocturia 05/18/2010  . Hypersomnia 05/18/2010  . Impaired glucose tolerance 05/18/2010  . Allergic rhinitis, cause unspecified 07/23/2012  . Anxiety state, unspecified 02/04/2013  . Cancer [facility])     Family History  family history includes COPD in her brother; Colon cancer in her father; Hypertension in her brother; Lung cancer in  her brother; Stroke in her mother.  Prior Rehab/Hospitalizations: Was receiving PT/OT 2 X a week through [facility], last time Thursday prior to admission.  Has the patient had major surgery during 100 days prior to admission? No  Current Medications   Current facility-administered medications:  .   stroke: mapping our early stages of recovery book, , Does not apply, Once, Costin M Gherghe, MD .  apixaban (ELIQUIS) tablet 5 mg, 5 mg, Oral, BID, Romona Curls, RPH, 5 mg at 03/15/15 2237 .  diclofenac sodium (VOLTAREN) 1 % transdermal gel 2 g, 2 g, Topical, QID, Caren Griffins, MD, 2 g at 03/14/15 2152 .  diltiazem (CARDIZEM CD) 24 hr capsule 240 mg, 240 mg, Oral, Daily, Caren Griffins, MD, 240 mg at 03/15/15 1050 .  exemestane (AROMASIN) tablet 25 mg, 25 mg, Oral, Daily, Caren Griffins, MD, 25 mg at 03/15/15 1050 .  hydrALAZINE (APRESOLINE) injection 5 mg, 5 mg, Intravenous, Q6H PRN, Fransico Meadow, PA-C, 5 mg at 03/15/15 0243 .  Influenza vac split quadrivalent PF (FLUARIX) injection 0.5 mL, 0.5 mL, Intramuscular, Tomorrow-1000, Costin Karlyne Greenspan, MD .  insulin aspart (novoLOG) injection 0-9 Units, 0-9 Units, Subcutaneous, TID WC, Costin Karlyne Greenspan, MD, 1 Units at 03/15/15 1315 .  metoprolol (LOPRESSOR) injection 5 mg, 5 mg, Intravenous, Q5 min PRN, Fransico Meadow, PA-C, 5 mg at 03/15/15 [id number] .  metoprolol tartrate (LOPRESSOR) tablet 12.5 mg, 12.5 mg, Oral, BID, Caren Griffins, MD, 12.5 mg at 03/15/15 2237 .  oxyCODONE-acetaminophen (PERCOCET) 7.5-325 MG per tablet 1 tablet, 1 tablet, Oral, Q8H PRN, Caren Griffins, MD, 1 tablet at 03/13/15 2343 .  pneumococcal 23 valent vaccine (PNU-IMMUNE) injection 0.5 mL, 0.5 mL, Intramuscular, Tomorrow-1000, Costin Karlyne Greenspan, MD .  pravastatin (PRAVACHOL) tablet 40 mg, 40 mg, Oral, q1800, Caren Griffins, MD, 40 mg at 03/15/15 1721 .  senna-docusate (Senokot-S) tablet 1 tablet, 1 tablet, Oral, QHS PRN, Caren Griffins, MD, 1 tablet at 03/13/15 2009 .   temazepam (RESTORIL) capsule 7.5 mg, 7.5 mg, Oral, QHS PRN, Caren Griffins, MD  Patients Current Diet: Diet heart healthy/carb modified Room service appropriate?: Yes; Fluid consistency:: Thin  Precautions / Restrictions Precautions Precautions: Fall Restrictions Weight Bearing Restrictions: No   Has the patient had 2 or more falls or a fall with injury in the past year?No.  Had one fall this past Sunday with no injury.  Prior Activity Level  Household: Went out to MD appointments 2-3 X a month.  Home Assistive Devices / Equipment Home Assistive Devices/Equipment: None Home Equipment: Walker - 2 wheels, Wheelchair - manual  Prior Device Use: Indicate devices/aids used by the patient prior to current illness, exacerbation or injury? Walker  Prior Functional Level Prior Function Level of Independence: Needs assistance Gait / Transfers Assistance Needed: Only amb household distances with  walker ADL's / Homemaking Assistance Needed: Pt reports she could B/D herself but daughter helps her sometimes  Self Care: Did the patient need help bathing, dressing, using the toilet or eating?  Needed some help  Indoor Mobility: Did the patient need assistance with walking from room to room (with or without device)? Needed some help  Stairs: Did the patient need assistance with internal or external stairs (with or without device)? Needed some help  Functional Cognition: Did the patient need help planning regular tasks such as shopping or remembering to take medications? Needed some help  Current Functional Level Cognition  Arousal/Alertness: Awake/alert Overall Cognitive Status: Impaired/Different from baseline Current Attention Level: Sustained Orientation Level: Oriented to person, Disoriented to place, Disoriented to time, Disoriented to situation Following Commands: Follows one step commands with increased time Safety/Judgement: Decreased awareness of safety, Decreased awareness of  deficits Attention: Sustained Sustained Attention: Impaired Sustained Attention Impairment: Verbal basic, Functional basic Memory: Impaired Memory Impairment: Storage deficit, Retrieval deficit, Decreased recall of new information Awareness: Impaired Awareness Impairment: Emergent impairment Problem Solving: Impaired Problem Solving Impairment: Verbal basic, Functional basic Executive Function:  (all impaired due to lower level deficits ) Safety/Judgment: Impaired    Extremity Assessment (includes Sensation/Coordination)  Upper Extremity Assessment: RUE deficits/detail, LUE deficits/detail RUE Deficits / Details: decreased coordination; ataxia RUE Sensation: decreased proprioception RUE Coordination: decreased fine motor, decreased gross motor LUE Deficits / Details: decreased coordination (but not as bad as RUE), ataxia LUE Sensation: decreased proprioception LUE Coordination: decreased gross motor, decreased fine motor  Lower Extremity Assessment: RLE deficits/detail RLE Deficits / Details: Grossly 3/5 but with ataxia and apraxia    ADLs  Overall ADL's : Needs assistance/impaired Eating/Feeding: Minimal assistance, Sitting (due to legally blind and ataxia of UEs) Grooming: Oral care, Brushing hair (supported sitting) Grooming Details (indicate cue type and reason): Max A for brushing teeth (could not open toothpaste, nor apply toothpaste to toothbrush) difficulty with hand to mouth (due to ataxia)-once toothbrush in mouth she could brush teeth, but could not manage cup to spit and and wipe mouth. Combing hair (Mod A) difficulty due to apraxia Upper Body Bathing: Moderate assistance (supported sitting) Lower Body Bathing: Total assistance (Max A sit<>stand) Upper Body Dressing : Total assistance (supported sitting) Lower Body Dressing: Total assistance (Max A sit<>stand)    Mobility  Overal bed mobility: Needs Assistance Bed Mobility: Rolling, Supine to Sit Rolling: Min assist,  Mod assist (required min for rolling R and mod assist for rolling L.  Pt performed multiple bouts of rolling for perianal care post found in urine from incontinence.  Pt reports she is unaware of incontinence.  ) Sidelying to sit: Mod assist General bed mobility comments: Pt required cues for hand placement and trunk control to improve technique and ease during transitions.  Pt required min assist to maintain sitting balance edge of bed with noticeable LOB forward.      Transfers  Overall transfer level: Needs assistance Equipment used: 1 person hand held assist Transfer via Lift Equipment: Stedy Transfers: Sit to/from Stand (required sit to stand from edge of bed to  stedy and stedy to standing for transfer to chair.  ) Sit to Stand: Mod assist, +2 physical assistance Stand pivot transfers: +2 physical assistance, Mod assist General transfer comment: Pt remains to require assist to boost hips from sitting to 3/4 standing trial to place stedy for stand pivot from bed to chair.  Pt required max VCs with tactile cues for hand placement to ascend from seated surface x 2 bouts.  Pt tolerated transfer well and presents with improved strength in R UE.  Pt remains to require assist for R foot placement onto foot plate of stedy.      Ambulation / Gait / Stairs / Wheelchair Mobility  Ambulation/Gait Ambulation/Gait assistance:  (remains unsafe at this time.  )    Posture / Balance Dynamic Sitting Balance Sitting balance - Comments: Pt requires one UE A to stay foreward stting in recliner Balance Overall balance assessment: Needs assistance Sitting-balance support: Feet supported, Single extremity supported Sitting balance-Leahy Scale: Poor Sitting balance - Comments: Pt requires one UE A to stay foreward stting in recliner Postural control: Posterior lean Standing balance support: Bilateral upper extremity supported Standing balance-Leahy Scale: Zero Standing balance comment: Stood with +2 assist  with walker or Stedy    Special needs/care consideration BiPAP/CPAP No CPM No Continuous Drip IV No Dialysis No        Life Vest No Oxygen No Special Bed No Trach Size No Wound Vac (area) No      Skin Has moles on her back.  Skin itches.                           Bowel mgmt: Last BM 03/15/15 Bladder mgmt: Wears depends at home for urgency, stress incontinence Diabetic mgmt No    Previous Home Environment Living Arrangements: Children  Lives With: Family, Daughter Available Help at Discharge: Family, Available 24 hours/day Type of Home: House Home Layout: One level Home Access: Ramped entrance Bathroom Shower/Tub: Gaffer, Ethel: No  Discharge Living Setting Plans for Discharge Living Setting: House, Lives with (comment) (Lives with daughter, another daughter and granddaughter.) Type of Home at Discharge: House Discharge Home Layout: One level Discharge Home Access: [place] entrance Does the patient have any problems obtaining your medications?: No  Social/Family/Support Systems Patient Roles: Parent (Has 2 daughters and a granddaughter.) Contact Information: Unknown [place] - daughter Anticipated Caregiver: daughter Anticipated Caregiver's Contact Information: [name] - daughter - [phone] Ability/Limitations of Caregiver: Daughter works from her home, can provided supervision and light assistance Caregiver Availability: Other (Comment) (Close to 24/7 support.) Discharge Plan Discussed with Primary Caregiver: Yes Is Caregiver In Agreement with Plan?: Yes Does Caregiver/Family have Issues with Lodging/Transportation while Pt is in Rehab?: No  Goals/Additional Needs Patient/Family Goal for Rehab: PT/OT/ST min assist goals Expected length of stay: 15-18 days Cultural Considerations: [employer] Dietary Needs: Heart healthy, carb mod, thin liquids Equipment Needs: TBD Pt/Family Agrees to Admission and willing to participate:  Yes Program Orientation Provided & Reviewed with Pt/Caregiver Including Roles  & Responsibilities: Yes  Decrease burden of Care through IP rehab admission: N/A  Possible need for SNF placement upon discharge: Not planned  Patient Condition: This patient's medical and functional status has changed since the consult dated: 03/14/15 in which the Rehabilitation Physician determined and documented that the patient's condition is appropriate for intensive rehabilitative care in an inpatient rehabilitation facility. See "History of Present Illness" (above) for medical update. Functional changes are:  Currently  requiring mod assist for stand pivot transfers. Patient's medical and functional status update has been discussed with the Rehabilitation physician and patient remains appropriate for inpatient rehabilitation. Will admit to inpatient rehab today.  Preadmission Screen Completed By:  Retta Diones, 03/16/2015 10:26 AM ______________________________________________________________________   Discussed status with Dr. Posey Pronto on 03/16/15 at 1027 and received telephone approval for admission today.  Admission Coordinator:  Retta Diones, time1027/Date02/15/17

## 2015-03-16 NOTE — Progress Notes (Signed)
Patient ID: Rose Sullivan, female   DOB: 1933-08-01, 82 y.o.   MRN: [id number] Patient admitted to 4W02 via bed, escorted by nursing staff and family.  Patient and family verbalized understanding of rehab process, signed fall safety agreement.  Appears to be in no immediate distress at this time.  Brita Romp, RN

## 2015-03-16 NOTE — Interval H&P Note (Signed)
Rose Sullivan was admitted today to Inpatient Rehabilitation with the diagnosis of to left thalamic and left mesial temporal lobe infarct.  The patient's history has been reviewed, patient examined, and there is no change in status.  Patient continues to be appropriate for intensive inpatient rehabilitation.  I have reviewed the patient's chart and labs.  Questions were answered to the patient's satisfaction. The PAPE has been reviewed and assessment remains appropriate.  Ankit Lorie Phenix 03/16/2015, 8:55 PM

## 2015-03-17 ENCOUNTER — Inpatient Hospital Stay (HOSPITAL_COMMUNITY): Payer: Medicare Other | Admitting: Occupational Therapy

## 2015-03-17 ENCOUNTER — Inpatient Hospital Stay (HOSPITAL_COMMUNITY): Payer: Medicare Other | Admitting: Speech Pathology

## 2015-03-17 ENCOUNTER — Telehealth: Payer: Self-pay | Admitting: *Deleted

## 2015-03-17 ENCOUNTER — Inpatient Hospital Stay (HOSPITAL_COMMUNITY): Payer: Medicare Other | Admitting: Physical Therapy

## 2015-03-17 LAB — CBC WITH DIFFERENTIAL/PLATELET
BASOS ABS: 0 10*3/uL (ref 0.0–0.1)
BASOS PCT: 0 %
EOS PCT: 5 %
Eosinophils Absolute: 0.4 10*3/uL (ref 0.0–0.7)
HEMATOCRIT: 41 % (ref 36.0–46.0)
Hemoglobin: 13.7 g/dL (ref 12.0–15.0)
Lymphocytes Relative: 21 %
Lymphs Abs: 2 10*3/uL (ref 0.7–4.0)
MCH: 30.6 pg (ref 26.0–34.0)
MCHC: 33.4 g/dL (ref 30.0–36.0)
MCV: 91.5 fL (ref 78.0–100.0)
MONO ABS: 0.7 10*3/uL (ref 0.1–1.0)
Monocytes Relative: 7 %
NEUTROS ABS: 6.6 10*3/uL (ref 1.7–7.7)
Neutrophils Relative %: 67 %
PLATELETS: 253 10*3/uL (ref 150–400)
RBC: 4.48 MIL/uL (ref 3.87–5.11)
RDW: 12.8 % (ref 11.5–15.5)
WBC: 9.7 10*3/uL (ref 4.0–10.5)

## 2015-03-17 LAB — COMPREHENSIVE METABOLIC PANEL
ALT: 26 U/L (ref 14–54)
ANION GAP: 12 (ref 5–15)
AST: 51 U/L — ABNORMAL HIGH (ref 15–41)
Albumin: 3.2 g/dL — ABNORMAL LOW (ref 3.5–5.0)
Alkaline Phosphatase: 55 U/L (ref 38–126)
BUN: 29 mg/dL — ABNORMAL HIGH (ref 6–20)
CHLORIDE: 110 mmol/L (ref 101–111)
CO2: 20 mmol/L — ABNORMAL LOW (ref 22–32)
Calcium: 8.6 mg/dL — ABNORMAL LOW (ref 8.9–10.3)
Creatinine, Ser: 1.15 mg/dL — ABNORMAL HIGH (ref 0.44–1.00)
GFR, EST AFRICAN AMERICAN: 51 mL/min — AB (ref >60)
GFR, EST NON AFRICAN AMERICAN: 44 mL/min — AB (ref >60)
Glucose, Bld: 106 mg/dL — ABNORMAL HIGH (ref 65–99)
POTASSIUM: 3.5 mmol/L (ref 3.5–5.1)
Sodium: 142 mmol/L (ref 135–145)
Total Bilirubin: 0.9 mg/dL (ref 0.3–1.2)
Total Protein: 6.3 g/dL — ABNORMAL LOW (ref 6.5–8.1)

## 2015-03-17 LAB — GLUCOSE, CAPILLARY
GLUCOSE-CAPILLARY: 95 mg/dL (ref 65–99)
Glucose-Capillary: 123 mg/dL — ABNORMAL HIGH (ref 65–99)

## 2015-03-17 NOTE — Telephone Encounter (Signed)
Pt was on TCM list admitted for CVA. D/c 03/16/15 will be f/u with stroke clinic Evlyn Courier, NP.../lmb

## 2015-03-17 NOTE — Care Management Note (Signed)
[facility] Individual Statement of Services  Patient Name:  Rose Sullivan  Date:  03/17/2015  Welcome to the [facility].  Our goal is to provide you with an individualized program based on your diagnosis and situation, designed to meet your specific needs.  With this comprehensive rehabilitation program, you will be expected to participate in at least 3 hours of rehabilitation therapies Monday-Friday, with modified therapy programming on the weekends.  Your rehabilitation program will include the following services:  Physical Therapy (PT), Occupational Therapy (OT), Speech Therapy (ST), 24 hour per day rehabilitation nursing, Therapeutic Recreaction (TR), Case Management (Social Worker), Rehabilitation Medicine, Nutrition Services and Pharmacy Services  Weekly team conferences will be held on Wednesday to discuss your progress.  Your Social Worker will talk with you frequently to get your input and to update you on team discussions.  Team conferences with you and your family in attendance may also be held.  Expected length of stay: 14-18 days  Overall anticipated outcome: min assist level  Depending on your progress and recovery, your program may change. Your Social Worker will coordinate services and will keep you informed of any changes. Your Social Worker's name and contact numbers are listed  below.  The following services may also be recommended but are not provided by the [facility]:    [place] will be made to provide these services after discharge if needed.  Arrangements include referral to agencies that provide these services.  Your insurance has been verified to be:  Medicare & Medicaid Your primary doctor is:  [name]  Pertinent information will be shared with your doctor and your insurance company.  Social Worker:  Ovidio Kin, [place] or ([phone]  Information discussed with and copy given to patient by: Elease Hashimoto, 03/17/2015, 12:49 PM

## 2015-03-17 NOTE — Evaluation (Signed)
Occupational Therapy Assessment and Plan  Patient Details  Name: Rose Sullivan MRN: [id number] Date of Birth: [birth year]  OT Diagnosis: apraxia, ataxia, blindness and low vision, cognitive deficits, hemiplegia affecting dominant side and muscle weakness (generalized) Rehab Potential: Rehab Potential (ACUTE ONLY): Good ELOS: 14-18 days   Today's Date: 03/17/2015 OT Individual Time: 0945-1100 OT Individual Time Calculation (min): 75 min     Problem List:  Patient Active Problem List   Diagnosis Date Noted  . Acute ischemic VBA thalamic stroke ([place]) 03/16/2015  . Hemiparesis affecting dominant side as late effect of stroke ([place])   . Disorientation   . Chronic back pain   . Benign essential HTN   . Paroxysmal atrial fibrillation ([facility])   . HLD (hyperlipidemia)   . Hx of breast cancer   . Legally blind   . Atrial fibrillation with RVR ([place]) 03/15/2015  . Acute CVA (cerebrovascular accident) ([place]) 03/14/2015  . CKD (chronic kidney disease)   . Altered mental status   . Acute encephalopathy 03/13/2015  . DM (diabetes mellitus) ([place]) 03/13/2015  . Cerebral embolism with cerebral infarction 03/13/2015  . Neck pain 02/09/2014  . Paresthesia of arm 02/09/2014  . Lumbar facet arthropathy 03/09/2013  . Unspecified hereditary and idiopathic peripheral neuropathy 03/09/2013  . Osteoarthritis of right hip 03/09/2013  . Osteoarthritis of right knee 03/09/2013  . Osteoarthritis of left knee 03/09/2013  . Anxiety state, unspecified 02/04/2013  . Chronic anticoagulation 11/04/2012  . Allergic rhinitis, cause unspecified 07/23/2012  . Bilateral hearing loss 06/27/2011  . Insomnia 06/27/2011  . Breast cancer metastasized to lung ([place]) 06/05/2011  . Preventative health care 01/21/2011  . Cervical radicular pain 01/09/2011  . Lumbar radicular pain 01/09/2011  . Vitamin D deficiency 08/31/2010  . Bilateral knee pain 08/31/2010  . Impaired glucose tolerance 05/18/2010  . Hypersomnia  05/18/2010  . Nocturia 05/18/2010  . Other specified forms of hearing loss 09/05/2009  . DYSPNEA 03/18/2009  . Atrial fibrillation ([place]) 03/07/2009  . FATIGUE 12/10/2007  . GAIT DISTURBANCE 12/10/2007  . Hyperlipidemia 02/11/2007  . Essential hypertension 02/11/2007  . GERD 02/11/2007  . OSTEOPENIA 02/11/2007  . BLINDNESS, [place], [place] DEFINITION 11/25/2006  . LOW BACK PAIN, CHRONIC 11/25/2006    Past Medical History:  Past Medical History  Diagnosis Date  . GLUCOSE INTOLERANCE 02/11/2007  . HYPERLIPIDEMIA 02/11/2007  . BLINDNESS, [place], [place] DEFINITION 11/25/2006  . Other specified forms of hearing loss 09/05/2009  . HYPERTENSION 02/11/2007  . Atrial fibrillation ([place]) 03/07/2009  . GERD 02/11/2007  . LOW BACK PAIN, CHRONIC 11/25/2006  . BACK PAIN 12/10/2007  . Pain in Soft Tissues of Limb 02/11/2007  . OSTEOPENIA 02/11/2007  . FATIGUE 12/10/2007  . GAIT DISTURBANCE 12/10/2007  . DYSPNEA 03/18/2009  . Cough 03/07/2009  . CHEST PAIN 12/10/2007  . Abdominal pain, left lower quadrant 04/01/2007  . BREAST CANCER, HX OF 11/25/2006  . Nocturia 05/18/2010  . Hypersomnia 05/18/2010  . Impaired glucose tolerance 05/18/2010  . Allergic rhinitis, cause unspecified 07/23/2012  . Anxiety state, unspecified 02/04/2013  . Cancer [facility])    Past Surgical History:  Past Surgical History  Procedure Laterality Date  . Dilation and curettage of uterus    . S/p mastectomy    . Appendectomy      Assessment & Plan Clinical Impression: Patient is a 82 y.o. right handed female with history of hypertension, hyperlipidemia, chronic back pain followed at the pain clinic by Dr.Swartz, legally blind and atrial fibrillation. By report patient lives with children  as well as a personal care attendant who provide assistance as needed. Patient states that she needed help with all ADLs prior to admission. Noted baseline memory impairments. One level home with ramped entrance. Ambulated household distances with a walker.  Presented 03/13/2015 with altered mental status, increasing lethargy and progressive weakness over the previous couple of days as well as memory problems. MRI of the brain showed small acute or early subacute infarct left thalamus and mesial left temporal lobe. Extensive chronic small vessel disease. Patient did not receive TPA. CTA of neck negative. CTA of the head showed occluded distal left P1 segment. Echocardiogram with ejection fraction of 60% no wall motion abnormalities. EEG was negative. Blood pressure heart rate monitored with heart rate 130s-140s on occasion and asymptomatic as patient remains on Cardizem and Lopressor was added. Neurology consulted maintained on Eliquis for CVA prophylaxis with history of atrial fibrillation. Regular diet consistency. Physical therapy evaluation completed with recommendations of physical medicine rehabilitation consult.   Patient transferred to CIR on 03/16/2015 .    Patient currently requires Max-total with basic self-care skills secondary to muscle weakness, impaired timing and sequencing, unbalanced muscle activation, motor apraxia, ataxia, decreased coordination and decreased motor planning, decreased visual acuity and decreased visual motor skills, decreased attention, decreased awareness, decreased problem solving, decreased safety awareness and decreased memory and decreased standing balance, decreased postural control, hemiplegia and decreased balance strategies.  Prior to hospitalization, patient could complete ADLs with min-mod assistance.  Patient will benefit from skilled intervention to decrease level of assist with basic self-care skills prior to discharge home with care partner.  Anticipate patient will require 24 hour supervision and minimal physical assistance and follow up home health.  OT - End of Session Activity Tolerance: Tolerates 30+ min activity with multiple rests Endurance Deficit: Yes Endurance Deficit Description: required multiple  rest breaks OT Assessment Rehab Potential (ACUTE ONLY): Good OT Patient demonstrates impairments in the following area(s): Balance;Cognition;Endurance;Motor;Safety;Sensory;Vision OT Basic ADL's Functional Problem(s): Eating;Grooming;Bathing;Dressing;Toileting OT Transfers Functional Problem(s): Toilet;Tub/Shower OT Additional Impairment(s): Fuctional Use of Upper Extremity OT Plan OT Intensity: Minimum of 1-2 x/day, 45 to 90 minutes OT Frequency: 5 out of 7 days OT Duration/Estimated Length of Stay: 14-18 days OT Treatment/Interventions: Balance/vestibular training;Cognitive remediation/compensation;Discharge planning;Disease mangement/prevention;DME/adaptive equipment instruction;Functional mobility training;Neuromuscular re-education;Pain management;Patient/family education;Psychosocial support;Self Care/advanced ADL retraining;Therapeutic Activities;Therapeutic Exercise;UE/LE Strength taining/ROM;UE/LE Coordination activities;Visual/perceptual remediation/compensation;Wheelchair propulsion/positioning OT Self Feeding Anticipated Outcome(s): Supervision/setup OT Basic Self-Care Anticipated Outcome(s): Min assist OT Toileting Anticipated Outcome(s): Min assist OT Bathroom Transfers Anticipated Outcome(s): Min assist OT Recommendation Recommendations for Other Services: Neuropsych consult Patient destination: Home Follow Up Recommendations: Home health OT;24 hour supervision/assistance Equipment Recommended: Tub/shower bench;To be determined Equipment Details: Pt poor historian with varying reports on equipment already owned and using   Skilled Therapeutic Intervention OT eval completed with discussion of rehab process, OT purpose, POC, ELOS, and goals.  ADL assessment completed at sit > stand level in room shower.  Mod-max assist squat pivot transfers secondary to decreased initiation and motor planning.  Pt demonstrating impaired problem solving, often stating "I shouldn't do this" or "you  just do it".  Educated on purpose of OT and goals to increase independence with ADLs.  Pt required multiple rest breaks and encouragement to attempt tasks prior to receiving assistance.  Noted functional use of RUE during self-care tasks, with pt reporting improved from previous days.  OT Evaluation Precautions/Restrictions  Precautions Precautions: Fall Restrictions Weight Bearing Restrictions: No Pain Pain Assessment Pain Assessment: No/denies pain Pain Score: 0-No pain Home  Living/Prior Functioning Home Living Family/patient expects to be discharged to:: Private residence Living Arrangements: Children Available Help at Discharge: Family, Available 24 hours/day Type of Home: House Home Access: Ramped entrance [place]: One level Bathroom Shower/Tub: Tub/shower unit  Lives With: Family, Daughter Prior Function Level of Independence: Needs assistance with ADLs (According to acute chart pt requires assist with ADLs, pt reports sometimes assist and sometimes Independent)  Able to Take Stairs?: No Driving: No Vocation: Retired ADL  See Nurse, children's Vision/Perception  Vision- History Baseline Vision/History: Legally blind Patient Visual Report: No change from baseline Vision- Assessment Vision Assessment?: Vision impaired- to be further tested in functional context Additional Comments: Pt reports she should wear glasses all the time, but does not. Her left eye turns out to far left at rest (pt cannot tell me if this is normal for her), no double vision. Pt can track in all directions with both eyes but left eye will not track full medially. She is able to find cup in all quadrants with difficulty in right upper and lower quadrants  Cognition Overall Cognitive Status: No family/caregiver present to determine baseline cognitive functioning Arousal/Alertness: Awake/alert Orientation Level: Person;Place;Situation Person: Oriented Place: Oriented Situation: Oriented (rehab  unit) Year:  (90something?) Month:  (stated multiple months starting with June, then when asked for season pt stated spring) Day of Week: Correct Memory: Impaired Memory Impairment: Storage deficit;Retrieval deficit;Decreased recall of new information Immediate Memory Recall: Sock;Blue;Bed Memory Recall: Blue Memory Recall Blue: With Cue Awareness: Impaired Problem Solving: Impaired Problem Solving Impairment: Verbal basic;Functional basic Safety/Judgment: Impaired Sensation Sensation Light Touch: Impaired by gross assessment (pt reports Rt arm "feels different") Stereognosis: Not tested Hot/Cold: Not tested Coordination Fine Motor Movements are Fluid and Coordinated: No Finger Nose Finger Test: decreased speed on Rt 9 Hole Peg Test: did not assess due to vision Extremity/Trunk Assessment RUE Assessment RUE Assessment: Exceptions to [facility] (mild ataxia and apraxia, strength grossly 4-/5 throughout) LUE Assessment LUE Assessment: Exceptions to [facility] (limited shoulder flexion to 90 degrees (pt reports no prior injury), strength grossly 4-/5 throughout)   See Function Navigator for Current Functional Status.   Refer to Care Plan for Long Term Goals  Recommendations for other services: Neuropsych  Discharge Criteria: Patient will be discharged from OT if patient refuses treatment 3 consecutive times without medical reason, if treatment goals not met, if there is a change in medical status, if patient makes no progress towards goals or if patient is discharged from hospital.  The above assessment, treatment plan, treatment alternatives and goals were discussed and mutually agreed upon: by patient  Simonne Come 03/17/2015, 11:17 AM

## 2015-03-17 NOTE — Progress Notes (Signed)
82 y.o. right handed female with history of hypertension, hyperlipidemia, chronic back pain followed at the pain clinic by Dr.Swartz, legally blind and atrial fibrillation. By report patient lives with children as well as a personal care attendant who provide assistance as needed. Patient states that she needed help with all ADLs prior to admission. Noted baseline memory impairments. One level home with ramped entrance. Ambulated household distances with a walker. Presented 03/13/2015 with altered mental status, increasing lethargy and progressive weakness over the previous couple of days as well as memory problems. MRI of the brain showed small acute or early subacute infarct left thalamus and mesial left temporal lobe. Extensive chronic small vessel disease. Patient did not receive TPA. CTA of neck negative. CTA of the head showed occluded distal left P1 segment. Echocardiogram with ejection fraction of 60% no wall motion abnormalities. EEG was negative. Blood pressure heart rate monitored with heart rate 130s-140s on occasion and asymptomatic as patient remains on Cardizem and Lopressor was added  Subjective/Complaints: Patient states she had an okay night. Physical therapy is getting ready to work with her. Patient cannot tell me much about her visual impairment. She doesn't think there is any new vision problems since the stroke. Review of systems denies any breathing difficulties no chest pain no abdominal pain Objective: Vital Signs: Blood pressure 152/68, pulse 68, temperature 97.7 F (36.5 C), temperature source Oral, resp. rate 18, SpO2 97 %. No results found. Results for orders placed or performed during the hospital encounter of 03/16/15 (from the past 72 hour(s))  Glucose, capillary     Status: Abnormal   Collection Time: 03/16/15  8:52 PM  Result Value Ref Range   Glucose-Capillary 141 (H) 65 - 99 mg/dL   Comment 1 Notify RN   CBC WITH DIFFERENTIAL     Status: None   Collection Time:  03/17/15  5:35 AM  Result Value Ref Range   WBC 9.7 4.0 - 10.5 K/uL   RBC 4.48 3.87 - 5.11 MIL/uL   Hemoglobin 13.7 12.0 - 15.0 g/dL   HCT 41.0 36.0 - 46.0 %   MCV 91.5 78.0 - 100.0 fL   MCH 30.6 26.0 - 34.0 pg   MCHC 33.4 30.0 - 36.0 g/dL   RDW 12.8 11.5 - 15.5 %   Platelets 253 150 - 400 K/uL   Neutrophils Relative % 67 %   Neutro Abs 6.6 1.7 - 7.7 K/uL   Lymphocytes Relative 21 %   Lymphs Abs 2.0 0.7 - 4.0 K/uL   Monocytes Relative 7 %   Monocytes Absolute 0.7 0.1 - 1.0 K/uL   Eosinophils Relative 5 %   Eosinophils Absolute 0.4 0.0 - 0.7 K/uL   Basophils Relative 0 %   Basophils Absolute 0.0 0.0 - 0.1 K/uL  Comprehensive metabolic panel     Status: Abnormal   Collection Time: 03/17/15  5:35 AM  Result Value Ref Range   Sodium 142 135 - 145 mmol/L   Potassium 3.5 3.5 - 5.1 mmol/L   Chloride 110 101 - 111 mmol/L   CO2 20 (L) 22 - 32 mmol/L   Glucose, Bld 106 (H) 65 - 99 mg/dL   BUN 29 (H) 6 - 20 mg/dL   Creatinine, Ser 1.15 (H) 0.44 - 1.00 mg/dL   Calcium 8.6 (L) 8.9 - 10.3 mg/dL   Total Protein 6.3 (L) 6.5 - 8.1 g/dL   Albumin 3.2 (L) 3.5 - 5.0 g/dL   AST 51 (H) 15 - 41 U/L   ALT 26  14 - 54 U/L   Alkaline Phosphatase 55 38 - 126 U/L   Total Bilirubin 0.9 0.3 - 1.2 mg/dL   GFR calc non Af Amer 44 (L) >60 mL/min   GFR calc Af Amer 51 (L) >60 mL/min    Comment: (NOTE) The eGFR has been calculated using the CKD EPI equation. This calculation has not been validated in all clinical situations. eGFR's persistently <60 mL/min signify possible Chronic Kidney Disease.    Anion gap 12 5 - 15  Glucose, capillary     Status: None   Collection Time: 03/17/15  6:38 AM  Result Value Ref Range   Glucose-Capillary 95 65 - 99 mg/dL   Comment 1 Notify RN       General: No acute distress Mood and affect are appropriate Heart: Regular rate and rhythm no rubs murmurs or extra sounds Lungs: Clear to auscultation, breathing unlabored, no rales or wheezes Abdomen: Positive bowel  sounds, soft nontender to palpation, nondistended Extremities: No clubbing, cyanosis, or edema Skin: No evidence of breakdown, no evidence of rash Neurologic: Cranial nerves II through XII intact, motor strength is 5/5 in right deltoid, bicep, tricep, grip, hip flexor, knee extensors, ankle dorsiflexor and plantar flexor 4/5 in the left deltoid, biceps, triceps, grip 3 minus/5 in the left hip flexor and extensor and ankle dorsiflexor and plantar flexor Positive ataxia finger-nose-finger testing in the right upper extremity Sensory exam normal sensation to light touch and proprioception in bilateral upper and lower extremities  Musculoskeletal: Full range of motion in all 4 extremities. No joint swelling   Assessment/Plan: 1. Functional deficits secondary to  left thalamic and left mesial temporal lobe infarctwith gait disorder and cognitive deficits which require 3+ hours per day of interdisciplinary therapy in a comprehensive inpatient rehab setting. Physiatrist is providing close team supervision and 24 hour management of active medical problems listed below. Physiatrist and rehab team continue to assess barriers to discharge/monitor patient progress toward functional and medical goals. FIM:                                   Medical Problem List and Plan: 1.  Altered mental status/right hemiparesis secondary to left thalamic and left mesial temporal lobe infarct, initiate rehabilitation program 2.  DVT Prophylaxis/Anticoagulation: Eliquis 3. Pain Management/chronic back pain: Voltaren Gel 4 times a day, Percocet 7.5-325 mg 1 tab every 8 hours as needed 4. Hypertension/atrial fibrillation. Monitor cardiac rate. Allow permissive hypertension. Lopressor 12.5 mg twice a day, Cardizem CD 240 mg daily, ContinueEliquis   5. Neuropsych: This patient is capable of making decisions on her own behalf.she will need to have help with medication management and for safety post  discharge 6. Skin/Wound Care: Routine skin checks 7. Fluids/Electrolytes/Nutrition: Routine I&O's Labs reviewed BUN 29- enc po, may need nocturnal IVF if po intake <1069m during the day 8.Hyperlipidemia. Pravachol 9. History of breast cancer 2008.Aromasin 25 mg daily 9. Legally blind. She can see figures and objects.   LOS (Days) 1 A FACE TO FACE EVALUATION WAS PERFORMED  Nila Winker E 03/17/2015, 8:14 AM

## 2015-03-17 NOTE — Evaluation (Signed)
Physical Therapy Assessment and Plan  Patient Details  Name: Rose Sullivan MRN: [id number] Date of Birth: [birth year]  PT Diagnosis: Abnormal posture, Abnormality of gait, Ataxia, Coordination disorder, Difficulty walking, Hemiparesis dominant, Impaired cognition, Impaired sensation and Muscle weakness Rehab Potential: Good ELOS: 15-18 days   Today's Date: 03/17/2015 PT Individual Time: 0800-0900 PT Individual Time Calculation (min): 60 min    Problem List:  Patient Active Problem List   Diagnosis Date Noted  . Acute ischemic VBA thalamic stroke ([place]) 03/16/2015  . Hemiparesis affecting dominant side as late effect of stroke ([place])   . Disorientation   . Chronic back pain   . Benign essential HTN   . Paroxysmal atrial fibrillation (HCC)   . HLD (hyperlipidemia)   . Hx of breast cancer   . Legally blind   . Atrial fibrillation with RVR ([place]) 03/15/2015  . Acute CVA (cerebrovascular accident) ([place]) 03/14/2015  . CKD (chronic kidney disease)   . Altered mental status   . Acute encephalopathy 03/13/2015  . DM (diabetes mellitus) ([place]) 03/13/2015  . Cerebral embolism with cerebral infarction 03/13/2015  . Neck pain 02/09/2014  . Paresthesia of arm 02/09/2014  . Lumbar facet arthropathy 03/09/2013  . Unspecified hereditary and idiopathic peripheral neuropathy 03/09/2013  . Osteoarthritis of right hip 03/09/2013  . Osteoarthritis of right knee 03/09/2013  . Osteoarthritis of left knee 03/09/2013  . Anxiety state, unspecified 02/04/2013  . Chronic anticoagulation 11/04/2012  . Allergic rhinitis, cause unspecified 07/23/2012  . Bilateral hearing loss 06/27/2011  . Insomnia 06/27/2011  . Breast cancer metastasized to lung ([place]) 06/05/2011  . Preventative health care 01/21/2011  . Cervical radicular pain 01/09/2011  . Lumbar radicular pain 01/09/2011  . Vitamin D deficiency 08/31/2010  . Bilateral knee pain 08/31/2010  . Impaired glucose tolerance 05/18/2010  . Hypersomnia  05/18/2010  . Nocturia 05/18/2010  . Other specified forms of hearing loss 09/05/2009  . DYSPNEA 03/18/2009  . Atrial fibrillation ([place]) 03/07/2009  . FATIGUE 12/10/2007  . GAIT DISTURBANCE 12/10/2007  . Hyperlipidemia 02/11/2007  . Essential hypertension 02/11/2007  . GERD 02/11/2007  . OSTEOPENIA 02/11/2007  . BLINDNESS, [place], [place] DEFINITION 11/25/2006  . LOW BACK PAIN, CHRONIC 11/25/2006    Past Medical History:  Past Medical History  Diagnosis Date  . GLUCOSE INTOLERANCE 02/11/2007  . HYPERLIPIDEMIA 02/11/2007  . BLINDNESS, [place], [place] DEFINITION 11/25/2006  . Other specified forms of hearing loss 09/05/2009  . HYPERTENSION 02/11/2007  . Atrial fibrillation ([place]) 03/07/2009  . GERD 02/11/2007  . LOW BACK PAIN, CHRONIC 11/25/2006  . BACK PAIN 12/10/2007  . Pain in Soft Tissues of Limb 02/11/2007  . OSTEOPENIA 02/11/2007  . FATIGUE 12/10/2007  . GAIT DISTURBANCE 12/10/2007  . DYSPNEA 03/18/2009  . Cough 03/07/2009  . CHEST PAIN 12/10/2007  . Abdominal pain, left lower quadrant 04/01/2007  . BREAST CANCER, HX OF 11/25/2006  . Nocturia 05/18/2010  . Hypersomnia 05/18/2010  . Impaired glucose tolerance 05/18/2010  . Allergic rhinitis, cause unspecified 07/23/2012  . Anxiety state, unspecified 02/04/2013  . Cancer [facility])    Past Surgical History:  Past Surgical History  Procedure Laterality Date  . Dilation and curettage of uterus    . S/p mastectomy    . Appendectomy      Assessment & Plan Clinical Impression: Patient is a 82 y.o. right handed female with history of hypertension, hyperlipidemia, chronic back pain followed at the pain clinic by Dr.Swartz, legally blind and atrial fibrillation. By report patient lives with children as well  as a personal care attendant who provide assistance as needed. Patient states that she needed help with all ADLs prior to admission. Noted baseline memory impairments. One level home with ramped entrance. Ambulated household distances with a walker.  Presented 03/13/2015 with altered mental status, increasing lethargy and progressive weakness over the previous couple of days as well as memory problems. MRI of the brain showed small acute or early subacute infarct left thalamus and mesial left temporal lobe. Extensive chronic small vessel disease. Patient did not receive TPA. CTA of neck negative. CTA of the head showed occluded distal left P1 segment. Echocardiogram with ejection fraction of 60% no wall motion abnormalities. EEG was negative. Blood pressure heart rate monitored with heart rate 130s-140s on occasion and asymptomatic as patient remains on Cardizem and Lopressor was added. Neurology consulted maintained on Eliquis for CVA prophylaxis with history of atrial fibrillation. Regular diet consistency. Physical therapy evaluation completed with recommendations of physical medicine rehabilitation consult. Patient transferred to CIR on 03/16/2015 .   Patient currently requires mod with mobility secondary to muscle weakness, decreased cardiorespiratoy endurance, ataxia and decreased coordination, decreased visual acuity and decreased sitting balance, decreased standing balance, decreased postural control, hemiplegia and decreased balance strategies.  Prior to hospitalization, patient was supervision with mobility and lived with Family, Daughter in a House home.  Home access is  Ramped entrance.  Patient will benefit from skilled PT intervention to maximize safe functional mobility, minimize fall risk and decrease caregiver burden for planned discharge home with 24 hour assist.  Anticipate patient will benefit from follow up [facility] at discharge.  PT - End of Session Activity Tolerance: Tolerates 30+ min activity with multiple rests Endurance Deficit: Yes Endurance Deficit Description: required multiple rest breaks PT Assessment Rehab Potential (ACUTE/IP ONLY): Good Barriers to Discharge: Decreased caregiver support (unsure of family situation, ability  to provide 24/7) PT Patient demonstrates impairments in the following area(s): Balance;Perception;Safety;Endurance;Motor;Sensory PT Transfers Functional Problem(s): Bed Mobility;Bed to Chair;Car;Furniture PT Locomotion Functional Problem(s): Ambulation;Wheelchair Mobility;Stairs PT Plan PT Intensity: Minimum of 1-2 x/day ,45 to 90 minutes PT Frequency: 5 out of 7 days PT Duration Estimated Length of Stay: 15-18 days PT Treatment/Interventions: Ambulation/gait training;Balance/vestibular training;Cognitive remediation/compensation;Discharge planning;Disease management/prevention;DME/adaptive equipment instruction;Functional mobility training;Neuromuscular re-education;Patient/family education;Psychosocial support;Stair training;Therapeutic Activities;UE/LE Strength taining/ROM;Wheelchair propulsion/positioning;UE/LE Coordination activities;Therapeutic Exercise PT Transfers Anticipated Outcome(s): S PT Locomotion Anticipated Outcome(s): minA household distances with LRAD  Skilled Therapeutic Intervention Pt received supine in bed, no c/o pain and agreeable to treatment. Pt oriented to all but year, and personal information pt gave differs from admission coordinator assessment, however known history of memory impairments. PT initial evaluation performed and completed. Rolling R/L in bed to A with peri hygiene and donning brief after incontinent bladder accident in bed with no brief on previously. Assessed all mobility as described below. Requires modA overall for mobility tasks, limited by fatigue and mild anxiety. Educated pt in purpose of rehab, goals, estimated length of stay, and anticipation that pt will need 24/7 assist at d/c. Pt remained seated in w/c at completion of session, quick release belt intact and all needs within reach.   PT Evaluation Precautions/Restrictions Precautions Precautions: Fall Restrictions Weight Bearing Restrictions: No General Chart Reviewed: Yes Response to  Previous Treatment: Not applicable Family/Caregiver Present: No  Pain Pain Assessment Pain Assessment: No/denies pain Home Living/Prior Functioning Home Living Available Help at Discharge: Family;Available 24 hours/day Type of Home: House Home Access: Ramped entrance Home Layout: One level Bathroom Shower/Tub: Tub/shower unit  Lives With: Family;Daughter Prior Function  Level of Independence: Needs assistance with ADLs (According to acute chart pt requires assist with ADLs, pt reports sometimes assist and sometimes Independent) Driving: No Vocation: Retired Radiographer, therapeutic - Assessment Additional Comments: Pt reports she should wear glasses all the time, but does not. Her left eye turns out to far left at rest (pt cannot tell me if this is normal for her), no double vision. Pt can track in all directions with both eyes but left eye will not track full medially. She is able to find cup in all quadrants with difficulty in right upper and lower quadrants  Cognition Overall Cognitive Status: No family/caregiver present to determine baseline cognitive functioning Arousal/Alertness: Awake/alert Memory: Impaired Memory Impairment: Storage deficit;Retrieval deficit;Decreased recall of new information Awareness: Impaired Problem Solving: Impaired Problem Solving Impairment: Verbal basic;Functional basic Safety/Judgment: Impaired Sensation Sensation Light Touch: Impaired by gross assessment (reports "L arm feels funny", BLEs WFL) Stereognosis: Not tested Hot/Cold: Not tested Proprioception: Not tested (unable to formally assess due to difficulty with command following) Coordination Gross Motor Movements are Fluid and Coordinated: No Fine Motor Movements are Fluid and Coordinated: No Finger Nose Finger Test: decreased speed on Rt Heel Shin Test: mild ataxia RLE  9 Hole Peg Test: did not assess due to vision Motor  Motor Motor: Hemiplegia;Ataxia Motor - Skilled Clinical  Observations: ataxia R extremities, R hemiparesis  Mobility Bed Mobility Bed Mobility: Rolling Right;Rolling Left;Supine to Sit Rolling Right: 3: Mod assist;With rail Rolling Right Details: Verbal cues for technique;Verbal cues for sequencing;Verbal cues for precautions/safety Rolling Left: 3: Mod assist Rolling Left Details: Verbal cues for sequencing;Verbal cues for technique;Verbal cues for precautions/safety Supine to Sit: 3: Mod assist;With rails;HOB flat Supine to Sit Details: Verbal cues for precautions/safety;Verbal cues for technique;Verbal cues for sequencing;Tactile cues for placement Transfers Transfers: Yes Squat Pivot Transfers: 3: Mod assist Squat Pivot Transfer Details: Verbal cues for sequencing;Verbal cues for technique;Verbal cues for precautions/safety;Manual facilitation for placement;Tactile cues for posture Locomotion  Ambulation Ambulation: Yes Ambulation/Gait Assistance: 3: Mod assist Ambulation Distance (Feet): 5 Feet Assistive device: Rolling walker Ambulation/Gait Assistance Details: Verbal cues for technique;Verbal cues for precautions/safety;Tactile cues for posture Gait Gait: Yes Gait Pattern: Impaired Gait Pattern: Decreased stride length;Trunk flexed;Poor foot clearance - left;Poor foot clearance - right Gait velocity: decreased for age/gender norms Stairs / Additional Locomotion Stairs: No Architect: Yes Wheelchair Assistance: 4: Advertising account executive Details: Verbal cues for sequencing;Verbal cues for technique;Verbal cues for safe use of DME/AE Wheelchair Propulsion: Both upper extremities Wheelchair Parts Management: Needs assistance Distance: 100  Trunk/Postural Assessment  Cervical Assessment Cervical Assessment: Within Functional Limits Thoracic Assessment Thoracic Assessment: Exceptions to [facility] (increased thoracic kyphosis) Lumbar Assessment Lumbar Assessment: Exceptions to [facility] (reduced lumbar  lordosis, posterior pelvic tilt, sacral sitting) Postural Control Postural Control: Deficits on evaluation (delayed in inefficient righting reactions, stepping strategies)  Balance Balance Balance Assessed: Yes Static Sitting Balance Static Sitting - Balance Support: Right upper extremity supported;Feet supported Static Sitting - Level of Assistance: 5: Stand by assistance Dynamic Sitting Balance Dynamic Sitting - Balance Support: Feet supported;During functional activity;Right upper extremity supported Dynamic Sitting - Level of Assistance: 5: Stand by assistance Dynamic Sitting - Balance Activities: Lateral lean/weight shifting;Forward lean/weight shifting;Reaching for Consulting civil engineer Standing - Balance Support: Bilateral upper extremity supported Static Standing - Level of Assistance: 4: Min assist Static Standing - Comment/# of Minutes: x2 min Dynamic Standing Balance Dynamic Standing - Balance Support: Bilateral upper extremity supported Dynamic Standing - Level  of Assistance: 3: Mod assist Dynamic Standing - Balance Activities: Forward lean/weight shifting;Reaching for objects Extremity Assessment  RUE Assessment RUE Assessment: Exceptions to [facility] (mild ataxia and apraxia, grossly 4-/5 throughout) LUE Assessment LUE Assessment: Exceptions to [facility] (strength grossly 4-/5, limited shoulder flexion) RLE Assessment RLE Assessment: Exceptions to [facility] (mild ataxia, Hip flexion AROM limited by pain, hip flexion 4-/5, knee extension 4/5, knee flexion 4-/5, ankle dorsiflexion 4-/5, ankle plantarflexion 4/5) LLE Assessment LLE Assessment: Exceptions to [facility] (grossly 4/5 throughout)   See Function Navigator for Current Functional Status.   Refer to Care Plan for Long Term Goals  Recommendations for other services: Neuropsych  Discharge Criteria: Patient will be discharged from PT if patient refuses treatment 3 consecutive times without medical reason, if treatment  goals not met, if there is a change in medical status, if patient makes no progress towards goals or if patient is discharged from hospital.  The above assessment, treatment plan, treatment alternatives and goals were discussed and mutually agreed upon: by patient  Luberta Mutter 03/17/2015, 12:33 PM

## 2015-03-17 NOTE — Evaluation (Signed)
Speech Language Pathology Assessment and Plan  Patient Details  Name: Rose Sullivan MRN: [id number] Date of Birth: [birth year]  SLP Diagnosis: Dysarthria;Cognitive Impairments  Rehab Potential: Good ELOS: 14 days    Today's Date: 03/17/2015 SLP Individual Time: 1445-1540 SLP Individual Time Calculation (min): 55 min   Problem List:  Patient Active Problem List   Diagnosis Date Noted  . Acute ischemic VBA thalamic stroke ([place]) 03/16/2015  . Hemiparesis affecting dominant side as late effect of stroke ([place])   . Disorientation   . Chronic back pain   . Benign essential HTN   . Paroxysmal atrial fibrillation (HCC)   . HLD (hyperlipidemia)   . Hx of breast cancer   . Legally blind   . Atrial fibrillation with RVR ([place]) 03/15/2015  . Acute CVA (cerebrovascular accident) ([place]) 03/14/2015  . CKD (chronic kidney disease)   . Altered mental status   . Acute encephalopathy 03/13/2015  . DM (diabetes mellitus) ([place]) 03/13/2015  . Cerebral embolism with cerebral infarction 03/13/2015  . Neck pain 02/09/2014  . Paresthesia of arm 02/09/2014  . Lumbar facet arthropathy 03/09/2013  . Unspecified hereditary and idiopathic peripheral neuropathy 03/09/2013  . Osteoarthritis of right hip 03/09/2013  . Osteoarthritis of right knee 03/09/2013  . Osteoarthritis of left knee 03/09/2013  . Anxiety state, unspecified 02/04/2013  . Chronic anticoagulation 11/04/2012  . Allergic rhinitis, cause unspecified 07/23/2012  . Bilateral hearing loss 06/27/2011  . Insomnia 06/27/2011  . Breast cancer metastasized to lung ([place]) 06/05/2011  . Preventative health care 01/21/2011  . Cervical radicular pain 01/09/2011  . Lumbar radicular pain 01/09/2011  . Vitamin D deficiency 08/31/2010  . Bilateral knee pain 08/31/2010  . Impaired glucose tolerance 05/18/2010  . Hypersomnia 05/18/2010  . Nocturia 05/18/2010  . Other specified forms of hearing loss 09/05/2009  . DYSPNEA 03/18/2009  . Atrial  fibrillation ([place]) 03/07/2009  . FATIGUE 12/10/2007  . GAIT DISTURBANCE 12/10/2007  . Hyperlipidemia 02/11/2007  . Essential hypertension 02/11/2007  . GERD 02/11/2007  . OSTEOPENIA 02/11/2007  . BLINDNESS, [place], [place] DEFINITION 11/25/2006  . LOW BACK PAIN, CHRONIC 11/25/2006   Past Medical History:  Past Medical History  Diagnosis Date  . GLUCOSE INTOLERANCE 02/11/2007  . HYPERLIPIDEMIA 02/11/2007  . BLINDNESS, [place], [place] DEFINITION 11/25/2006  . Other specified forms of hearing loss 09/05/2009  . HYPERTENSION 02/11/2007  . Atrial fibrillation ([place]) 03/07/2009  . GERD 02/11/2007  . LOW BACK PAIN, CHRONIC 11/25/2006  . BACK PAIN 12/10/2007  . Pain in Soft Tissues of Limb 02/11/2007  . OSTEOPENIA 02/11/2007  . FATIGUE 12/10/2007  . GAIT DISTURBANCE 12/10/2007  . DYSPNEA 03/18/2009  . Cough 03/07/2009  . CHEST PAIN 12/10/2007  . Abdominal pain, left lower quadrant 04/01/2007  . BREAST CANCER, HX OF 11/25/2006  . Nocturia 05/18/2010  . Hypersomnia 05/18/2010  . Impaired glucose tolerance 05/18/2010  . Allergic rhinitis, cause unspecified 07/23/2012  . Anxiety state, unspecified 02/04/2013  . Cancer [facility])    Past Surgical History:  Past Surgical History  Procedure Laterality Date  . Dilation and curettage of uterus    . S/p mastectomy    . Appendectomy      Assessment / Plan / Recommendation Clinical Impression Rose Sullivan is a 82 y.o. right handed female with history of hypertension, hyperlipidemia, chronic back pain followed at the pain clinic by Dr.Swartz, legally blind and atrial fibrillation. By report patient lives with children as well as a personal care attendant who provide assistance as needed. Patient states  that she needed help with all ADLs prior to admission. Noted baseline memory impairments.  Presented 03/13/2015 with altered mental status, increasing lethargy and progressive weakness over the previous couple of days as well as memory problems. MRI of the brain showed  small acute or early subacute infarct left thalamus and mesial left temporal lobe.  Pt admitted to CIR on 03/16/2015.  SLP evaluation completed on 03/17/2015 with the following results: Pt presents with a subtle dysarthria resulting from impaired facial sensation and imprecise articulation of consonants, possibly due to missing dentition.  At the time of evaluation, pt was fully intelligible in conversations but pt's daughter reports having a difficult time understanding pt over the phone.  Pt also presents with significant cognitive deficits characterized by inconsistent orientation and decreased storage and retrieval of basic information.  Suspect that pt is near baseline mentation given report from pt and family.  However, pt would benefit from further diagnostic treatment of cognitive-linguistic function during basic, familiar tasks as well as education for memory compensatory strategies.  Do not anticipate that pt will need ST follow up at discharge.    Skilled Therapeutic Interventions          Cognitive-linguistic evaluation completed with results and recommendations reviewed with patient and family.     SLP Assessment  Patient will need skilled Protection Pathology Services during CIR admission    Recommendations  Patient destination: Home Follow up Recommendations: None Equipment Recommended: None recommended by SLP    SLP Frequency 3 to 5 out of 7 days   SLP Duration  SLP Intensity  SLP Treatment/Interventions 14 days  Minumum of 1-2 x/day, 30 to 90 minutes  Cognitive remediation/compensation;Cueing hierarchy;Functional tasks;Environmental controls;Internal/external aids;Patient/family education    Pain Pain Assessment Pain Assessment: No/denies pain  Prior Functioning Cognitive/Linguistic Baseline: Baseline deficits Baseline deficit details: memory Type of Home: House  Lives With: Family;Daughter Available Help at Discharge: Family;Available 24 hours/day Education:  7th-8th grade, per pt's report Vocation: Retired  Function:  Eating Eating                 Cognition Comprehension Comprehension assist level: Understands complex 90% of the time/cues 10% of the time  Expression   Expression assist level: Expresses basic needs/ideas: With extra time/assistive device  Social Interaction Social Interaction assist level: Interacts appropriately 90% of the time - Needs monitoring or encouragement for participation or interaction.  Problem Solving Problem solving assist level: Solves basic 50 - 74% of the time/requires cueing 25 - 49% of the time  Memory Memory assist level: Recognizes or recalls 25 - 49% of the time/requires cueing 50 - 75% of the time   Short Term Goals: Week 1: SLP Short Term Goal 1 (Week 1): Pt will be intelligible in conversations with mod I in > 80% of opportunities  SLP Short Term Goal 2 (Week 1): Pt will reorient to place, date, and situation with mod assist verbal cues.  SLP Short Term Goal 3 (Week 1): Pt will recall basic daily information after a ~3 minute delay with mod verbal cues.    Refer to Care Plan for Long Term Goals  Recommendations for other services: None  Discharge Criteria: Patient will be discharged from SLP if patient refuses treatment 3 consecutive times without medical reason, if treatment goals not met, if there is a change in medical status, if patient makes no progress towards goals or if patient is discharged from hospital.  The above assessment, treatment plan, treatment alternatives and goals were  discussed and mutually agreed upon: by patient and by family  Emilio Math 03/17/2015, 4:24 PM

## 2015-03-17 NOTE — Progress Notes (Signed)
Patient information reviewed and entered into eRehab system by Demaryius Imran, RN, CRRN, PPS Coordinator.  Information including medical coding and functional independence measure will be reviewed and updated through discharge.     Per nursing patient was given "Data Collection Information Summary for Patients in Inpatient Rehabilitation Facilities with attached "Privacy Act Statement-Health Care Records" upon admission.  

## 2015-03-17 NOTE — Progress Notes (Signed)
Social Work  Social Work Assessment and Plan  Patient Details  Name: Rose Sullivan MRN: [id number] Date of Birth: 27-Jul-1933  Today's Date: 03/17/2015  Problem List:  Patient Active Problem List   Diagnosis Date Noted  . Acute ischemic VBA thalamic stroke ([place]) 03/16/2015  . Hemiparesis affecting dominant side as late effect of stroke ([place])   . Disorientation   . Chronic back pain   . Benign essential HTN   . Paroxysmal atrial fibrillation (H[facility])   . HLD (hyperlipidemia)   . Hx of breast cancer   . Legally blind   . Atrial fibrillation with RVR ([place]) 03/15/2015  . Acute CVA (cerebrovascular accident) ([place]) 03/14/2015  . CKD (chronic kidney disease)   . Altered mental status   . Acute encephalopathy 03/13/2015  . DM (diabetes mellitus) ([place]) 03/13/2015  . Cerebral embolism with cerebral infarction 03/13/2015  . Neck pain 02/09/2014  . Paresthesia of arm 02/09/2014  . Lumbar facet arthropathy 03/09/2013  . Unspecified hereditary and idiopathic peripheral neuropathy 03/09/2013  . Osteoarthritis of right hip 03/09/2013  . Osteoarthritis of right knee 03/09/2013  . Osteoarthritis of left knee 03/09/2013  . Anxiety state, unspecified 02/04/2013  . Chronic anticoagulation 11/04/2012  . Allergic rhinitis, cause unspecified 07/23/2012  . Bilateral hearing loss 06/27/2011  . Insomnia 06/27/2011  . Breast cancer metastasized to lung ([place]) 06/05/2011  . Preventative health care 01/21/2011  . Cervical radicular pain 01/09/2011  . Lumbar radicular pain 01/09/2011  . Vitamin D deficiency 08/31/2010  . Bilateral knee pain 08/31/2010  . Impaired glucose tolerance 05/18/2010  . Hypersomnia 05/18/2010  . Nocturia 05/18/2010  . Other specified forms of hearing loss 09/05/2009  . DYSPNEA 03/18/2009  . Atrial fibrillation ([place]) 03/07/2009  . FATIGUE 12/10/2007  . GAIT DISTURBANCE 12/10/2007  . Hyperlipidemia 02/11/2007  . Essential hypertension 02/11/2007  . GERD 02/11/2007  .  OSTEOPENIA 02/11/2007  . BLINDNESS, [place], [place] DEFINITION 11/25/2006  . LOW BACK PAIN, CHRONIC 11/25/2006   Past Medical History:  Past Medical History  Diagnosis Date  . GLUCOSE INTOLERANCE 02/11/2007  . HYPERLIPIDEMIA 02/11/2007  . BLINDNESS, [place], [place] DEFINITION 11/25/2006  . Other specified forms of hearing loss 09/05/2009  . HYPERTENSION 02/11/2007  . Atrial fibrillation ([place]) 03/07/2009  . GERD 02/11/2007  . LOW BACK PAIN, CHRONIC 11/25/2006  . BACK PAIN 12/10/2007  . Pain in Soft Tissues of Limb 02/11/2007  . OSTEOPENIA 02/11/2007  . FATIGUE 12/10/2007  . GAIT DISTURBANCE 12/10/2007  . DYSPNEA 03/18/2009  . Cough 03/07/2009  . CHEST PAIN 12/10/2007  . Abdominal pain, left lower quadrant 04/01/2007  . BREAST CANCER, HX OF 11/25/2006  . Nocturia 05/18/2010  . Hypersomnia 05/18/2010  . Impaired glucose tolerance 05/18/2010  . Allergic rhinitis, cause unspecified 07/23/2012  . Anxiety state, unspecified 02/04/2013  . Cancer [facility])    Past Surgical History:  Past Surgical History  Procedure Laterality Date  . Dilation and curettage of uterus    . S/p mastectomy    . Appendectomy     Social History:  reports that she has quit smoking. She has never used smokeless tobacco. She reports that she does not drink alcohol or use illicit drugs.  Family / Support Systems Marital Status: Widow/Widower How Long?: [id number] Patient Roles: Parent Children: Angela [phone] Other Supports: Dorothy-daughter [phone] Anticipated Caregiver: Daughters and Physiological scientist Ability/Limitations of Caregiver: [name] works from their home and other daughter along with grandaughter can assist-at times in and out Caregiver Availability: Other (Comment) (Working on 24 care)  Family Dynamics: Close knit family pt has her three daughter's living with her along with a grandaughter. Pt reports they all get along and enjoy each other's company. Pt feels they wil do whatever she needs at home, she would like to do for  herself.  Social History Preferred language: English Religion: [employer] Cultural Background: No issues Education: High School Read: Yes Write: Yes Employment Status: Retired Freight forwarder Issues: No issues Guardian/Conservator: None-according to MD pt is capable of making her own decisions while here. Will make sure her daughter-Angela is here if any decisions need to be made-since pt has memory issues   Abuse/Neglect Physical Abuse: Denies Verbal Abuse: Denies Sexual Abuse: Denies Exploitation of patient/patient's resources: Denies Self-Neglect: Denies  Emotional Status Pt's affect, behavior adn adjustment status: Pt is motivated to [employer], she knows she has deficits and can not move on her own, but can describe stroke. Pt has always been independent and did as much as she could for herself prior to admission. She did have help with her ADL's by family and PCS worker. Recent Psychosocial Issues: other health issues-chronic pain, weakness, prior TIA Pyschiatric History: History of anxiety-takes medicines which help her and see's Dr Naaman Plummer in the pain clinic. Will monitor while here and have neuro-psych see if needed. Deferred depression screen due to strong faith and doing well at this time. Substance Abuse History: No issues  Patient / Family Perceptions, Expectations & Goals Pt/Family understanding of illness & functional limitations: Pt has a basic understanding of her stroke, forgets what she had but knows she has deficits. Her daughter can explain her stroke and expressed it had been progressive over a period of three days. She does talk wiht the MD and feels her questions have been answered. Premorbid pt/family roles/activities: Mom, grandmother, retiree, church member, etc Anticipated changes in roles/activities/participation: resume Pt/family expectations/goals: Pt states: " I want to do some for myself, not to rely on my daughter's."  [name] states: " We  would like her to be ambulating with a walker, but will help her she's our [employer]."  [employer]: Other (Comment) (PCS-2-3 hrs 5 days a week) Premorbid Home Care/DME Agencies: Other (Comment) (Had in past) Transportation available at discharge: Family provides transportation Resource referrals recommended: Neuropsychology, Support group (specify)  Discharge Planning Living Arrangements: Children, Other relatives Support Systems: Children, Other relatives, Friends/neighbors, Church/faith community Type of Residence: Private residence Insurance Resources: [employer], [employer] (specify county) Pensions consultant: SSD, Family Support Financial Screen Referred: Previously completed Living Expenses: Lives with family Money Management: Family Does the patient have any problems obtaining your medications?: No Home Management: Daughter's do the home management Patient/Family Preliminary Plans: Return home with her daughter's and PCS worker, family coming up with 24 hr care, since pt will need this upon discharge. Encouraged daugther to coem in and attend therapies with pt and to bring her clothes. Will work on discharge plans and provide support to pt. She does not want to bother anyone and will probably not use the call bell. Social Work Anticipated Follow Up Needs: HH/OP, Support Group  Clinical Impression Pleasant female who is aware she has deficits but not fully aware of her stroke. She wants to do well here and relies upon her daughter's to assist her. She has a strong faith and feels God is in control and to roll with it. Pt will require 24 hr care at discharge daughter-Angela is working on this. Will await team's evaluations and come up with a  safe discharge plan. Pt may benefit from moving her closer to the desk since will not bother Anyone by using the call bell or asking for assistance. Elease Hashimoto 03/17/2015, 2:39 PM

## 2015-03-18 ENCOUNTER — Inpatient Hospital Stay (HOSPITAL_COMMUNITY): Payer: Medicare Other | Admitting: Speech Pathology

## 2015-03-18 ENCOUNTER — Inpatient Hospital Stay (HOSPITAL_COMMUNITY): Payer: Medicare Other | Admitting: Physical Therapy

## 2015-03-18 ENCOUNTER — Inpatient Hospital Stay (HOSPITAL_COMMUNITY): Payer: Medicare Other | Admitting: Occupational Therapy

## 2015-03-18 NOTE — Progress Notes (Signed)
Speech Language Pathology Daily Session Note  Patient Details  Name: Rose Sullivan MRN: [id number] Date of Birth: 06-13-1933  Today's Date: 03/18/2015 SLP Individual Time: 0903-1000 SLP Individual Time Calculation (min): 57 min  Short Term Goals: Week 1: SLP Short Term Goal 1 (Week 1): Pt will be intelligible in conversations with mod I in > 80% of opportunities  SLP Short Term Goal 2 (Week 1): Pt will reorient to place, date, and situation with mod assist verbal cues.  SLP Short Term Goal 3 (Week 1): Pt will recall basic daily information after a ~3 minute delay with mod verbal cues.    Skilled Therapeutic Interventions:  Pt was seen for skilled ST targeting cognitive goals.  Pt was in bed upon arrival but agreeable to participate in [place].  Pt transferred to wheelchair with mod assist cues for sequencing.  Pt oriented to place and situation but not date.  SLP provided max assist verbal cues and frequent repetition of information via spaced retrieval training to facilitate recall of month and year for 50% accuracy after a ~30 second delay.  Mod assist cues needed for sequencing during transfer to and from wheelchair and toilet but pt was able to sequence hand hygiene and request assistance appropriately with overall min assist following set up at sink.  Pt was left in wheelchair with quick release belt donned and call bell left within reach.  Continue per current plan of care.    Function:  Eating Eating                 Cognition Comprehension Comprehension assist level: Understands complex 90% of the time/cues 10% of the time  Expression   Expression assist level: Expresses basic 90% of the time/requires cueing < 10% of the time.  Social Interaction Social Interaction assist level: Interacts appropriately 90% of the time - Needs monitoring or encouragement for participation or interaction.  Problem Solving Problem solving assist level: Solves basic 75 - 89% of the time/requires  cueing 10 - 24% of the time  Memory Memory assist level: Recognizes or recalls 25 - 49% of the time/requires cueing 50 - 75% of the time    Pain Pain Assessment Pain Assessment: No/denies pain Pain Score: 0-No pain  Therapy/Group: Individual Therapy  Ziara Thelander, Selinda Orion 03/18/2015, 12:33 PM

## 2015-03-18 NOTE — Progress Notes (Signed)
Social Work Patient ID: Rose Sullivan, female   DOB: Sep 03, 1933, 82 y.o.   MRN: [id number] Clarified pt according to daughter does not have PCS worker in the home. She has a home health aide via [place] two times a week that assists with bathing. Pt could toilet herself so daughter could leave her alone for 1-2 hours while she went to the grocery store or ran errands. She hopes she can get back to this level. Discussed pt will likely need 24 hr care according to the team, daughter will work On this.

## 2015-03-18 NOTE — IPOC Note (Signed)
Overall Plan of Care [facility]) Patient Details Name: Rose Sullivan MRN: [id number] DOB: [birth year]  Admitting Diagnosis: L CVA  Hospital Problems: Active Problems:   Acute ischemic VBA thalamic stroke ([place])   Hemiparesis affecting dominant side as late effect of stroke [facility])   Disorientation   Chronic back pain   Benign essential HTN   Paroxysmal atrial fibrillation (HCC)   HLD (hyperlipidemia)   Hx of breast cancer   Legally blind     Functional Problem List: Nursing Motor, Medication Management, Edema, Bladder, Perception, Safety, Endurance  PT Balance, Perception, Safety, Endurance, Motor, Sensory  OT Balance, Cognition, Endurance, Motor, Safety, Sensory, Vision  SLP Cognition, Linguistic  TR         Basic ADL's: OT Eating, Grooming, Bathing, Dressing, Toileting     Advanced  ADL's: OT       Transfers: PT Bed Mobility, Bed to Chair, Car, Manufacturing systems engineer, Metallurgist: PT Ambulation, Emergency planning/management officer, Stairs     Additional Impairments: OT Fuctional Use of Upper Extremity  SLP Social Cognition, Communication expression Memory, Attention, Problem Solving  TR      Anticipated Outcomes Item Anticipated Outcome  Self Feeding Supervision/setup  Swallowing      Basic self-care  Min assist  Toileting  Min assist   Bathroom Transfers Min assist  Bowel/Bladder  Min assist  Transfers  S  Locomotion  minA household distances with LRAD  Communication  Mod I   Cognition  Mod assist   Pain  < 3  Safety/Judgment  Min assist   Therapy Plan: PT Intensity: Minimum of 1-2 x/day ,45 to 90 minutes PT Frequency: 5 out of 7 days PT Duration Estimated Length of Stay: 15-18 days OT Intensity: Minimum of 1-2 x/day, 45 to 90 minutes OT Frequency: 5 out of 7 days OT Duration/Estimated Length of Stay: 14-18 days SLP Intensity: Minumum of 1-2 x/day, 30 to 90 minutes SLP Frequency: 3 to 5 out of 7 days SLP Duration/Estimated Length of Stay:  14 days       Team Interventions: Nursing Interventions Patient/Family Education, Bladder Management, Disease Management/Prevention, Cognitive Remediation/Compensation, Medication Management, Discharge Planning  PT interventions Ambulation/gait training, Balance/vestibular training, Cognitive remediation/compensation, Discharge planning, Disease management/prevention, DME/adaptive equipment instruction, Functional mobility training, Neuromuscular re-education, Patient/family education, Psychosocial support, Stair training, Therapeutic Activities, UE/LE Strength taining/ROM, Wheelchair propulsion/positioning, UE/LE Coordination activities, Therapeutic Exercise  OT Interventions Balance/vestibular training, Cognitive remediation/compensation, Discharge planning, Disease mangement/prevention, DME/adaptive equipment instruction, Functional mobility training, Neuromuscular re-education, Pain management, Patient/family education, Psychosocial support, Self Care/advanced ADL retraining, Therapeutic Activities, Therapeutic Exercise, UE/LE Strength taining/ROM, UE/LE Coordination activities, Visual/perceptual remediation/compensation, Wheelchair propulsion/positioning  SLP Interventions Cognitive remediation/compensation, Cueing hierarchy, Functional tasks, Environmental controls, Internal/external aids, Patient/family education  TR Interventions    SW/CM Interventions Discharge Planning, Psychosocial Support, Patient/Family Education    Team Discharge Planning: Destination: PT-  ,OT- Home , SLP-Home Projected Follow-up: PT- , OT-  Home health OT, 24 hour supervision/assistance, SLP-None Projected Equipment Needs: PT- , OT- Tub/shower bench, To be determined, SLP-None recommended by SLP Equipment Details: PT- , OT-Pt poor historian with varying reports on equipment already owned and using Patient/family involved in discharge planning: PT-  ,  OT-Patient, SLP-Patient, Family member/caregiver  MD ELOS: 15-18  days Medical Rehab Prognosis:  Excellent Assessment: The patient has been admitted for CIR therapies with the diagnosis of CVA. The team will be addressing functional mobility, strength, stamina, balance, safety, adaptive techniques and equipment, self-care, bowel and bladder mgt, patient and  caregiver education, NMR, visual-perceptual awareness, cognition, behavior, community reintegration, pain control. Goals have been set at min assist with ADL's and self-care and supervision to min assist with basic transfers and locomotion.    Meredith Staggers, MD, FAAPMR      See Team Conference Notes for weekly updates to the plan of care

## 2015-03-18 NOTE — Progress Notes (Signed)
Occupational Therapy Session Note  Patient Details  Name: Rose Sullivan MRN: [id number] Date of Birth: 26-Sep-1933  Today's Date: 03/18/2015 OT Individual Time: 1035-1200 OT Individual Time Calculation (min): 85 min    Short Term Goals: Week 1:  OT Short Term Goal 1 (Week 1): Pt will complete bathing with mod assist at sit > stand level OT Short Term Goal 2 (Week 1): Pt will complete UB dressing with min assist OT Short Term Goal 3 (Week 1): Pt will complete LB dressing with max assist at sit > stand level OT Short Term Goal 4 (Week 1): Pt will complete toilet transfer with mod assist with LRAD OT Short Term Goal 5 (Week 1): Pt will utilize RUE at nondominant level with min cues  Skilled Therapeutic Interventions/Progress Updates:    Treatment session with focus on ADL retraining, functional transfers, and upright standing tolerance.  Completed dressing task at sit > stand level at sink with pt requiring max-total assist secondary to visual impairments and decreased motivation.  Educated on purpose of therapy and OT goals.  In ADL apt engaged in toilet and tub/shower transfers with RW.  Pt required mod assist with toilet transfers, ambulating 10 feet x2 to/from toilet, with mod cues for sequencing with RW and hand placement with sit > stand.  Completed tub/shower transfer with tub transfer bench, pt requiring assist to bring both legs over tub ledge and max encouragement due to anxiety and fearfulness.  Pt required rest breaks between each activity.  Engaged in standing activity in therapy gym with focus on hand placement and anterior weight shift with sit > stand and tactile cues for upright standing balance.  Pt extremely fearful with standing task, requiring encouragement and tactile cues for reassurance.  Therapy Documentation Precautions:  Precautions Precautions: Fall Restrictions Weight Bearing Restrictions: No Pain: Pain Assessment Pain Assessment: No/denies pain Pain Score:  0-No pain  See Function Navigator for Current Functional Status.   Therapy/Group: Individual Therapy  Simonne Come 03/18/2015, 12:24 PM

## 2015-03-18 NOTE — Progress Notes (Signed)
Physical Therapy Session Note  Patient Details  Name: Rose Sullivan MRN: [id number] Date of Birth: 1933/12/06  Today's Date: 03/18/2015 PT Individual Time: 1300-1400 PT Individual Time Calculation (min): 60 min   Short Term Goals: Week 1:  PT Short Term Goal 1 (Week 1): Pt will perform bed mobility minA on flat bed PT Short Term Goal 2 (Week 1): Pt will perform squat pivot transfer consistent minA PT Short Term Goal 3 (Week 1): Pt will ambulate 56' with LRAD and modA PT Short Term Goal 4 (Week 1): Pt will propel w/c 150' with minA PT Short Term Goal 5 (Week 1): Pt will demonstrate static standing tolerance of 5 min for functional ADLs  Skilled Therapeutic Interventions/Progress Updates:    Pt received seated in w/c, denies pain and agreeable to treatment. Assisted pt with completing self-feeding, required assist to cut food, and required encouragement to eat as pt reports "I just don't have a taste for any of that". Discussed with RN possibility of having pt supervised and assisted during meals to improve intake due to inattention, poor vision. Daughter arrived to observe session. Discussed pt prior level of function, daughter reports she had been receiving home health therapy to "work on getting her walking better". States she has difficulty getting the pt out to the car for doctors appointments, would like pt to be able to ambulate into/out of bathroom without assist. States pt was very sedentary before, would stay in bed all day if she could. Gait 2 x 15' with RW and minA, very slow speed, forward flexed posture and pt c/o increasing R hip/knee pain with mobility. Daughter reports "it was her bad leg before, but she wasn't a candidate for surgery even though the knee has a tear". Stand pivot transfer w/c <> toilet with modA and grab bar. Pt performed doffing pants/brief, hygiene, and required modA for donning pants after using restroom. Sit <>stand at sink with minA, cues for hand placement on  w/c with pt tendency to grab onto sink with BUEs. Cues in standing to reduce leaning on elbow, and min verbal/tactile cues for sequencing to get soap, turn on water. Remained seated in w/c at completion of session, daughter present and educated on use of safety belt when leaving pt alone.   Therapy Documentation Precautions:  Precautions Precautions: Fall Restrictions Weight Bearing Restrictions: No Pain: Pain Assessment Pain Assessment: No/denies pain Pain Score: 0-No pain   See Function Navigator for Current Functional Status.   Therapy/Group: Individual Therapy  Luberta Mutter 03/18/2015, 2:24 PM

## 2015-03-18 NOTE — Progress Notes (Signed)
Subjective/Complaints: Daughter is visiting patient. Patient is awake, she has no complaints this morning. She does not remember me. Discussed with RN, patient was disoriented last night but not agitated. Slept okay. Review of systems denies any breathing difficulties no chest pain no abdominal pain Objective: Vital Signs: Blood pressure 146/80, pulse 71, temperature 98.2 F (36.8 C), temperature source Oral, resp. rate 17, height 5' 4"  (1.626 m), weight 87.907 kg (193 lb 12.8 oz), SpO2 91 %. No results found. Results for orders placed or performed during the hospital encounter of 03/16/15 (from the past 72 hour(s))  Glucose, capillary     Status: Abnormal   Collection Time: 03/16/15  5:46 PM  Result Value Ref Range   Glucose-Capillary 123 (H) 65 - 99 mg/dL  Glucose, capillary     Status: Abnormal   Collection Time: 03/16/15  8:52 PM  Result Value Ref Range   Glucose-Capillary 141 (H) 65 - 99 mg/dL   Comment 1 Notify RN   CBC WITH DIFFERENTIAL     Status: None   Collection Time: 03/17/15  5:35 AM  Result Value Ref Range   WBC 9.7 4.0 - 10.5 K/uL   RBC 4.48 3.87 - 5.11 MIL/uL   Hemoglobin 13.7 12.0 - 15.0 g/dL   HCT 41.0 36.0 - 46.0 %   MCV 91.5 78.0 - 100.0 fL   MCH 30.6 26.0 - 34.0 pg   MCHC 33.4 30.0 - 36.0 g/dL   RDW 12.8 11.5 - 15.5 %   Platelets 253 150 - 400 K/uL   Neutrophils Relative % 67 %   Neutro Abs 6.6 1.7 - 7.7 K/uL   Lymphocytes Relative 21 %   Lymphs Abs 2.0 0.7 - 4.0 K/uL   Monocytes Relative 7 %   Monocytes Absolute 0.7 0.1 - 1.0 K/uL   Eosinophils Relative 5 %   Eosinophils Absolute 0.4 0.0 - 0.7 K/uL   Basophils Relative 0 %   Basophils Absolute 0.0 0.0 - 0.1 K/uL  Comprehensive metabolic panel     Status: Abnormal   Collection Time: 03/17/15  5:35 AM  Result Value Ref Range   Sodium 142 135 - 145 mmol/L   Potassium 3.5 3.5 - 5.1 mmol/L   Chloride 110 101 - 111 mmol/L   CO2 20 (L) 22 - 32 mmol/L   Glucose, Bld 106 (H) 65 - 99 mg/dL   BUN 29 (H)  6 - 20 mg/dL   Creatinine, Ser 1.15 (H) 0.44 - 1.00 mg/dL   Calcium 8.6 (L) 8.9 - 10.3 mg/dL   Total Protein 6.3 (L) 6.5 - 8.1 g/dL   Albumin 3.2 (L) 3.5 - 5.0 g/dL   AST 51 (H) 15 - 41 U/L   ALT 26 14 - 54 U/L   Alkaline Phosphatase 55 38 - 126 U/L   Total Bilirubin 0.9 0.3 - 1.2 mg/dL   GFR calc non Af Amer 44 (L) >60 mL/min   GFR calc Af Amer 51 (L) >60 mL/min    Comment: (NOTE) The eGFR has been calculated using the CKD EPI equation. This calculation has not been validated in all clinical situations. eGFR's persistently <60 mL/min signify possible Chronic Kidney Disease.    Anion gap 12 5 - 15  Glucose, capillary     Status: None   Collection Time: 03/17/15  6:38 AM  Result Value Ref Range   Glucose-Capillary 95 65 - 99 mg/dL   Comment 1 Notify RN       General: No acute distress Mood  and affect are appropriate Heart: irregular, irregular no rubs murmurs or extra sounds Lungs: Clear to auscultation, breathing unlabored, no rales or wheezes Abdomen: Positive bowel sounds, soft nontender to palpation, nondistended Extremities: No clubbing, cyanosis, or edema Skin: No evidence of breakdown, no evidence of rash Neurologic: Cranial nerves II through XII intact, motor strength is 5/5 in right deltoid, bicep, tricep, grip, hip flexor, knee extensors, ankle dorsiflexor and plantar flexor 4/5 in the right deltoid, biceps, triceps, grip 3 minus/5 in the right hip flexor and extensor and ankle dorsiflexor and plantar flexor Positive ataxia finger-nose-finger testing in the right upper extremity Sensory exam normal sensation to light touch and proprioception in bilateral upper and lower extremities Oriented to person , hospital not cone and stroke. Not oriented to time Musculoskeletal: Full range of motion in all 4 extremities. No joint swelling   Assessment/Plan: 1. Functional deficits secondary to  left thalamic and left mesial temporal lobe infarctwith gait disorder and  cognitive deficits which require 3+ hours per day of interdisciplinary therapy in a comprehensive inpatient rehab setting. Physiatrist is providing close team supervision and 24 hour management of active medical problems listed below. Physiatrist and rehab team continue to assess barriers to discharge/monitor patient progress toward functional and medical goals. FIM: Function - Bathing Position: Shower Body parts bathed by patient: Chest, Abdomen, Left arm, Right arm Body parts bathed by helper: Front perineal area, Buttocks, Right upper leg, Left upper leg, Right lower leg, Left lower leg, Back Assist Level: Touching or steadying assistance(Pt > 75%) (Max assist)  Function- Upper Body Dressing/Undressing What is the patient wearing?: Hospital gown Assist Level:  (Total assist) Function - Lower Body Dressing/Undressing What is the patient wearing?: Underwear, Non-skid slipper socks Position: Wheelchair/chair at sink Underwear - Performed by helper: Thread/unthread right underwear leg, Thread/unthread left underwear leg, Pull underwear up/down Non-skid slipper socks- Performed by helper: Don/doff right sock, Don/doff left sock Assist for footwear: Dependant Assist for lower body dressing:  (Total assist)        Function - Chair/bed transfer Chair/bed transfer method: Squat pivot Chair/bed transfer assist level: Moderate assist (Pt 50 - 74%/lift or lower) Chair/bed transfer assistive device: Armrests Chair/bed transfer details: Verbal cues for sequencing, Verbal cues for technique, Verbal cues for precautions/safety, Manual facilitation for weight shifting, Manual facilitation for placement  Function - Locomotion: Wheelchair Will patient use wheelchair at discharge?:  (TBD) Type: Manual Max wheelchair distance: 100 Assist Level: Touching or steadying assistance (Pt > 75%) Assist Level: Touching or steadying assistance (Pt > 75%) Wheel 150 feet activity did not occur: Safety/medical  concerns Turns around,maneuvers to table,bed, and toilet,negotiates 3% grade,maneuvers on rugs and over doorsills: No Function - Locomotion: Ambulation Assistive device: Walker-rolling Max distance: 5 Assist level: Moderate assist (Pt 50 - 74%) Walk 10 feet activity did not occur: Safety/medical concerns Walk 50 feet with 2 turns activity did not occur: Safety/medical concerns Walk 150 feet activity did not occur: Safety/medical concerns Walk 10 feet on uneven surfaces activity did not occur: Safety/medical concerns  Function - Comprehension Comprehension: Auditory Comprehension assist level: Understands complex 90% of the time/cues 10% of the time  Function - Expression Expression: Verbal Expression assist level: Expresses basic needs/ideas: With extra time/assistive device  Function - Social Interaction Social Interaction assist level: Interacts appropriately 90% of the time - Needs monitoring or encouragement for participation or interaction.  Function - Problem Solving Problem solving assist level: Solves basic 50 - 74% of the time/requires cueing 25 - 49% of the  time  Function - Memory Memory assist level: Recognizes or recalls 25 - 49% of the time/requires cueing 50 - 75% of the time Patient normally able to recall (first 3 days only): That he or she is in a hospital   Medical Problem List and Plan: 1.  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  DVT Prophylaxis/Anticoagulation: Eliquis 3. Pain Management/chronic back pain: Voltaren Gel 4 times a day, Percocet 7.5-325 mg 1 tab every 8 hours as needed 4. Hypertension/atrial fibrillation. Monitor cardiac rate. Allow permissive hypertension. Lopressor 12.5 mg twice a day, Cardizem CD 240 mg daily, rate is 71 this morning, continue current dosesContinueEliquis   5. Neuropsych: This patient is capable of making decisions on her own  behalf,, can direct power of attorney but cannot make good financial judgments independently.she will need to have help with medication management and for safety post discharge 6. Skin/Wound Care: Routine skin checks 7. Fluids/Electrolytes/Nutrition: Routine I&O's Labs reviewed BUN 29- enc po, may need nocturnal IVF if po intake <1048m during the day 8.Hyperlipidemia. Pravachol 9. History of breast cancer 2008.Aromasin 25 mg daily 9. 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 She has a chronic left eye lateral deviation   LOS (Days) 2 A FACE TO FACE EVALUATION WAS PERFORMED  Rose Sullivan E 03/18/2015, 8:21 AM

## 2015-03-19 ENCOUNTER — Inpatient Hospital Stay (HOSPITAL_COMMUNITY): Payer: Medicare Other | Admitting: Speech Pathology

## 2015-03-19 ENCOUNTER — Inpatient Hospital Stay (HOSPITAL_COMMUNITY): Payer: Medicare Other | Admitting: Occupational Therapy

## 2015-03-19 ENCOUNTER — Inpatient Hospital Stay (HOSPITAL_COMMUNITY): Payer: Medicare Other | Admitting: *Deleted

## 2015-03-19 DIAGNOSIS — I63219 Cerebral infarction due to unspecified occlusion or stenosis of unspecified vertebral arteries: Secondary | ICD-10-CM

## 2015-03-19 NOTE — Progress Notes (Signed)
Subjective/Complaints: Pt resting. No issues overnight.  Review of systems denies any breathing difficulties no chest pain no abdominal pain Objective: Vital Signs: Blood pressure 144/76, pulse 78, temperature 97.8 F (36.6 C), temperature source Oral, resp. rate 17, height _0  (1.626 m), weight 87.907 kg (193 lb 12.8 oz), SpO2 95 %. No results found. Results for orders placed or performed during the hospital encounter of 03/16/15 (from the past 72 hour(s))  Glucose, capillary     Status: Abnormal   Collection Time: 03/16/15  5:46 PM  Result Value Ref Range   Glucose-Capillary 123 (H) 65 - 99 mg/dL  Glucose, capillary     Status: Abnormal   Collection Time: 03/16/15  8:52 PM  Result Value Ref Range   Glucose-Capillary 141 (H) 65 - 99 mg/dL   Comment 1 Notify RN   CBC WITH DIFFERENTIAL     Status: None   Collection Time: 03/17/15  5:35 AM  Result Value Ref Range   WBC 9.7 4.0 - 10.5 K/uL   RBC 4.48 3.87 - 5.11 MIL/uL   Hemoglobin 13.7 12.0 - 15.0 g/dL   HCT 41.0 36.0 - 46.0 %   MCV 91.5 78.0 - 100.0 fL   MCH 30.6 26.0 - 34.0 pg   MCHC 33.4 30.0 - 36.0 g/dL   RDW 12.8 11.5 - 15.5 %   Platelets 253 150 - 400 K/uL   Neutrophils Relative % 67 %   Neutro Abs 6.6 1.7 - 7.7 K/uL   Lymphocytes Relative 21 %   Lymphs Abs 2.0 0.7 - 4.0 K/uL   Monocytes Relative 7 %   Monocytes Absolute 0.7 0.1 - 1.0 K/uL   Eosinophils Relative 5 %   Eosinophils Absolute 0.4 0.0 - 0.7 K/uL   Basophils Relative 0 %   Basophils Absolute 0.0 0.0 - 0.1 K/uL  Comprehensive metabolic panel     Status: Abnormal   Collection Time: 03/17/15  5:35 AM  Result Value Ref Range   Sodium 142 135 - 145 mmol/L   Potassium 3.5 3.5 - 5.1 mmol/L   Chloride 110 101 - 111 mmol/L   CO2 20 (L) 22 - 32 mmol/L   Glucose, Bld 106 (H) 65 - 99 mg/dL   BUN 29 (H) 6 - 20 mg/dL   Creatinine, Ser 1.15 (H) 0.44 - 1.00 mg/dL   Calcium 8.6 (L) 8.9 - 10.3 mg/dL   Total Protein 6.3 (L) 6.5 - 8.1 g/dL   Albumin 3.2 (L) 3.5 -  5.0 g/dL   AST 51 (H) 15 - 41 U/L   ALT 26 14 - 54 U/L   Alkaline Phosphatase 55 38 - 126 U/L   Total Bilirubin 0.9 0.3 - 1.2 mg/dL   GFR calc non Af Amer 44 (L) >60 mL/min   GFR calc Af Amer 51 (L) >60 mL/min    Comment: (NOTE) The eGFR has been calculated using the CKD EPI equation. This calculation has not been validated in all clinical situations. eGFR's persistently <60 mL/min signify possible Chronic Kidney Disease.    Anion gap 12 5 - 15  Glucose, capillary     Status: None   Collection Time: 03/17/15  6:38 AM  Result Value Ref Range   Glucose-Capillary 95 65 - 99 mg/dL   Comment 1 Notify RN       General: No acute distress Mood and affect are appropriate Heart: irregular, irregular no rubs murmurs or extra sounds Lungs: Clear to auscultation, breathing unlabored, no rales or wheezes Abdomen: Positive  bowel sounds, soft nontender to palpation, nondistended Extremities: No clubbing, cyanosis, or edema Skin: No evidence of breakdown, no evidence of rash Neurologic: Cranial nerves II through XII intact, motor strength is 5/5 in right deltoid, bicep, tricep, grip, hip flexor, knee extensors, ankle dorsiflexor and plantar flexor 4/5 in the right deltoid, biceps, triceps, grip 3 minus/5 in the right hip flexor and extensor and ankle dorsiflexor and plantar flexor Positive ataxia finger-nose-finger testing in the right upper extremity Sensory exam normal sensation to light touch and proprioception in bilateral upper and lower extremities Oriented to person , hospital not cone and stroke. Not oriented to time Musculoskeletal: Full range of motion in all 4 extremities. No joint swelling   Assessment/Plan: 1. Functional deficits secondary to  left thalamic and left mesial temporal lobe infarctwith gait disorder and cognitive deficits which require 3+ hours per day of interdisciplinary therapy in a comprehensive inpatient rehab setting. Physiatrist is providing close team  supervision and 24 hour management of active medical problems listed below. Physiatrist and rehab team continue to assess barriers to discharge/monitor patient progress toward functional and medical goals. FIM: Function - Bathing Bathing activity did not occur: Refused Position: Shower Body parts bathed by patient: Chest, Abdomen, Left arm, Right arm Body parts bathed by helper: Front perineal area, Buttocks, Right upper leg, Left upper leg, Right lower leg, Left lower leg, Back Assist Level: Touching or steadying assistance(Pt > 75%) (Max assist)  Function- Upper Body Dressing/Undressing What is the patient wearing?: Button up shirt Button up shirt - Perfomed by patient: Thread/unthread right sleeve Button up shirt - Perfomed by helper: Thread/unthread left sleeve, Pull shirt around back, Button/unbutton shirt Assist Level:  (Max assist) Function - Lower Body Dressing/Undressing What is the patient wearing?: Pants, Non-skid slipper socks, Ted Hose Position: Wheelchair/chair at [employer] - Performed by helper: Thread/unthread right underwear leg, Thread/unthread left underwear leg, Pull underwear up/down Pants- Performed by helper: Thread/unthread right pants leg, Thread/unthread left pants leg, Pull pants up/down Non-skid slipper socks- Performed by helper: Don/doff left sock, Don/doff right sock TED Hose - Performed by helper: Don/doff right TED hose, Don/doff left TED hose Assist for footwear: Dependant Assist for lower body dressing:  (Total assist)  Function - Toileting Toileting steps completed by patient: Adjust clothing prior to toileting, Performs perineal hygiene Toileting steps completed by helper: Adjust clothing after toileting Toileting Assistive Devices: Grab bar or rail Assist level: Touching or steadying assistance (Pt.75%)  Function - Air cabin crew transfer assistive device: Grab bar, Walker Assist level to toilet: Moderate assist (Pt 50 - 74%/lift or  lower) Assist level from toilet: Moderate assist (Pt 50 - 74%/lift or lower)  Function - Chair/bed transfer Chair/bed transfer method: Squat pivot Chair/bed transfer assist level: Moderate assist (Pt 50 - 74%/lift or lower) Chair/bed transfer assistive device: Armrests Chair/bed transfer details: Verbal cues for sequencing, Verbal cues for technique, Verbal cues for precautions/safety, Manual facilitation for weight shifting, Manual facilitation for placement  Function - Locomotion: Wheelchair Will patient use wheelchair at discharge?:  (TBD) Type: Manual Max wheelchair distance: 100 Assist Level: Touching or steadying assistance (Pt > 75%) Assist Level: Touching or steadying assistance (Pt > 75%) Wheel 150 feet activity did not occur: Safety/medical concerns Turns around,maneuvers to table,bed, and toilet,negotiates 3% grade,maneuvers on rugs and over doorsills: No Function - Locomotion: Ambulation Assistive device: Walker-rolling Max distance: 15 Assist level: Touching or steadying assistance (Pt > 75%) Walk 10 feet activity did not occur: Safety/medical concerns Assist level: Touching or steadying  assistance (Pt > 75%) Walk 50 feet with 2 turns activity did not occur: Safety/medical concerns Walk 150 feet activity did not occur: Safety/medical concerns Walk 10 feet on uneven surfaces activity did not occur: Safety/medical concerns  Function - Comprehension Comprehension: Auditory Comprehension assist level: Understands complex 90% of the time/cues 10% of the time  Function - Expression Expression: Verbal Expression assist level: Expresses basic 90% of the time/requires cueing < 10% of the time.  Function - Social Interaction Social Interaction assist level: Interacts appropriately 90% of the time - Needs monitoring or encouragement for participation or interaction.  Function - Problem Solving Problem solving assist level: Solves basic 75 - 89% of the time/requires cueing 10  - 24% of the time  Function - Memory Memory assist level: Recognizes or recalls 25 - 49% of the time/requires cueing 50 - 75% of the time Patient normally able to recall (first 3 days only): That he or she is in a hospital   Medical Problem List and Plan: 1.  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  DVT Prophylaxis/Anticoagulation: Eliquis 3. Pain Management/chronic back pain: Voltaren Gel 4 times a day, Percocet 7.5-325 mg 1 tab every 8 hours as needed 4. Hypertension/atrial fibrillation. Monitor cardiac rate. Allow permissive hypertension. Lopressor 12.5 mg twice a day, Cardizem CD 240 mg daily, rate is 78 this morning, continue current dosesContinueEliquis   5. Neuropsych: This patient is capable of making decisions on her own behalf,, can direct power of attorney but cannot make good financial judgments independently.she will need to have help with medication management and for safety post discharge 6. Skin/Wound Care: Routine skin checks 7. Fluids/Electrolytes/Nutrition: Routine I&O's   -encourage PO  -recheck lytes on Monday 8.Hyperlipidemia. Pravachol 9. History of breast cancer 2008.Aromasin 25 mg daily 9. 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 She has a chronic left eye lateral deviation   LOS (Days) 3 A FACE TO FACE EVALUATION WAS PERFORMED  SWARTZ,ZACHARY T 03/19/2015, 8:12 AM

## 2015-03-19 NOTE — Progress Notes (Signed)
Speech Language Pathology Daily Session Note  Patient Details  Name: LAURNA PLAZA MRN: [id number] Date of Birth: 07/10/1933  Today's Date: 03/19/2015 SLP Individual Time: 1301-1330 SLP Individual Time Calculation (min): 29 min  Short Term Goals: Week 1: SLP Short Term Goal 1 (Week 1): Pt will be intelligible in conversations with mod I in > 80% of opportunities  SLP Short Term Goal 2 (Week 1): Pt will reorient to place, date, and situation with mod assist verbal cues.  SLP Short Term Goal 3 (Week 1): Pt will recall basic daily information after a ~3 minute delay with mod verbal cues.    Skilled Therapeutic Interventions:  Pt was seen for skilled ST targeting communication goals.  Pt in bed upon arrival, using bed pan with nurse tech.  Pt was encouraged to get out of bed after voiding and sit in the wheelchair.  Pt with complaints of hip pain once seated in the wheelchair but was intelligible over the phone when communicating need for pain meds to RN with mod I.  Pt able to recall 1 detail from AM therapy session with supervision question cues.  Pt's daughter present today and reported that pt is near baseline for cognition but her speech intelligibility is at times more difficult to understand when pt is tired.  Pt left in wheelchair with daughter at bedside.  Continue per current plan of care.        Function:  Eating Eating                 Cognition Comprehension Comprehension assist level: Understands complex 90% of the time/cues 10% of the time  Expression   Expression assist level: Expresses basic 90% of the time/requires cueing < 10% of the time.  Social Interaction Social Interaction assist level: Interacts appropriately 90% of the time - Needs monitoring or encouragement for participation or interaction.  Problem Solving Problem solving assist level: Solves basic 75 - 89% of the time/requires cueing 10 - 24% of the time  Memory Memory assist level: Recognizes or recalls  25 - 49% of the time/requires cueing 50 - 75% of the time    Pain Pain Assessment Pain Assessment: Faces Faces Pain Scale: Hurts a little bit Pain Type: Acute pain Pain Location: Hip Pain Orientation: Left Pain Descriptors / Indicators: Aching Pain Intervention(s): RN made aware  Therapy/Group: Individual Therapy  Barron Vanloan, Selinda Orion 03/19/2015, 2:56 PM

## 2015-03-19 NOTE — Progress Notes (Signed)
Occupational Therapy Session Note  Patient Details  Name: Rose Sullivan MRN: [id number] Date of Birth: July 12, 1933  Today's Date: 03/19/2015 OT Individual Time: 0735-0900 OT Individual Time Calculation (min): 85 min    Short Term Goals: Week 1:  OT Short Term Goal 1 (Week 1): Pt will complete bathing with mod assist at sit > stand level OT Short Term Goal 2 (Week 1): Pt will complete UB dressing with min assist OT Short Term Goal 3 (Week 1): Pt will complete LB dressing with max assist at sit > stand level OT Short Term Goal 4 (Week 1): Pt will complete toilet transfer with mod assist with LRAD OT Short Term Goal 5 (Week 1): Pt will utilize RUE at nondominant level with min cues  Skilled Therapeutic Interventions/Progress Updates:    Treatment session with focus on functional use of dominant RUE with self-feeding, self-care tasks, and seated therapeutic activity.  Pt eating breakfast upon arrival and requesting assist for further setup and positioning.  Completed squat pivot transfer to w/c to improve positioning, therapist assisting with cutting up meal, and positioning tray in more accessible position.  Pt declined bathing and dressing this session.  Engaged in oral care with setup and cue for thoroughness.  Therapeutic activity in unsupported sitting with focus on gross and fine motor control of RUE.  Completed sorting task with sorting beads by color, therapist choosing contrasting colors due to visual impairments, with pt still only being approx 60% accurate.  Engaged in stacking cups and stringing beads with pt reporting "look how much my Rt hand is able to do".  Pt requires encouragement throughout as pt continues to lament about being too much care.   Therapy Documentation Precautions:  Precautions Precautions: Fall Restrictions Weight Bearing Restrictions: No General:   Vital Signs: Therapy Vitals Pulse Rate: 74 BP: (!) 142/68 mmHg Pain:  Pt with no c/o pain  See  Function Navigator for Current Functional Status.   Therapy/Group: Individual Therapy  Simonne Come 03/19/2015, 12:17 PM

## 2015-03-19 NOTE — Progress Notes (Signed)
Physical Therapy Session Note  Patient Details  Name: Rose Sullivan MRN: [id number] Date of Birth: 02/10/1933  Today's Date: 03/19/2015 PT Individual Time: 1330-1445 PT Individual Time Calculation (min): 75 min   Short Term Goals: Week 1:  PT Short Term Goal 1 (Week 1): Pt will perform bed mobility minA on flat bed PT Short Term Goal 2 (Week 1): Pt will perform squat pivot transfer consistent minA PT Short Term Goal 3 (Week 1): Pt will ambulate 84' with LRAD and modA PT Short Term Goal 4 (Week 1): Pt will propel w/c 150' with minA PT Short Term Goal 5 (Week 1): Pt will demonstrate static standing tolerance of 5 min for functional ADLs  Skilled Therapeutic Interventions/Progress Updates:  Tx focused on functional mobility training, gait with RW, and NMR via forced use, manual facilitation, and multi-modal cues. Pt had elevated BP following brief walk, but decreased within 3 min of seated rest. RN aware and checking manually.   Pt instructed in WC propulsion x100' with Min A for steering to avoid obstacles on R and cues for stoke efficiency, limited by fatigue.  Stand-pivot transfers x5 throughout tx including WC<>Nustep<>toilet with up to Mod A throughout and cues for sequence, safety, and technique as pt relied heavily upon UEs and forward flexion, shuffling steps. Pt able to perform static standing 3x1 min with close S and postural cues.   Gait x15' with RW and Min/Mod A for steadying and cues for posture, step height, limited by fatigue.   Nustep x57min with bil UE/LE at level 3 for increased activity tolerance with only 1 rest break.   Sit>supine with S and assist for scooting HOB with cues and assist for advacing hips.   Pt left up with daughter and all needs in reach.       Therapy Documentation Precautions:  Precautions Precautions: Fall Restrictions Weight Bearing Restrictions: No General:   Vital Signs: Therapy Vitals Temp: 98.1 F (36.7 C) Temp Source:  Oral Pulse Rate: 64 Resp: 18 BP: 130/83 mmHg Patient Position (if appropriate): Lying Oxygen Therapy SpO2: 99 % O2 Device: Not Delivered Pain: none     See Function Navigator for Current Functional Status.   Therapy/Group: Individual Therapy   Kennieth Rad, PT, DPT  03/19/2015, 2:10 PM

## 2015-03-20 ENCOUNTER — Inpatient Hospital Stay (HOSPITAL_COMMUNITY): Payer: Medicare Other | Admitting: Physical Therapy

## 2015-03-20 MED ORDER — ZOLPIDEM TARTRATE 5 MG PO TABS
5.0000 mg | ORAL_TABLET | Freq: Every evening | ORAL | Status: DC | PRN
  Administered 2015-03-20 – 2015-03-23 (×4): 5 mg via ORAL
  Filled 2015-03-20 (×4): qty 1

## 2015-03-20 NOTE — Progress Notes (Signed)
Physical Therapy Session Note  Patient Details  Name: Rose Sullivan MRN: [id number] Date of Birth: 01/06/1934  Today's Date: 03/20/2015 PT Individual Time: 0910-0939 PT Individual Time Calculation (min): 29 min   Short Term Goals: Week 1:  PT Short Term Goal 1 (Week 1): Pt will perform bed mobility minA on flat bed PT Short Term Goal 2 (Week 1): Pt will perform squat pivot transfer consistent minA PT Short Term Goal 3 (Week 1): Pt will ambulate 59' with LRAD and modA PT Short Term Goal 4 (Week 1): Pt will propel w/c 150' with minA PT Short Term Goal 5 (Week 1): Pt will demonstrate static standing tolerance of 5 min for functional ADLs  Skilled Therapeutic Interventions/Progress Updates:    Pt received semi reclined in bed; family friend, Remo Lipps present. Performed semi reclined > sit with min A, HOB elevated, bed rail; multimodal cueing to advance LE's toward EOB, for hand placement on rail, for sequencing, and weight shifting. Performed squat pivot from bed > w/c (to L side) with mod A, multimodal cueing for sequencing, hand placement, attention to RUE. Transported to ortho gym in w/c with Total A for time management. Performed stand pivot fom w/c > simulated car (height of sedan, per family friend Richardson Landry) without AD requiring mod A, multimodal cueing for setup, full anterior weight shift, lateral weight shifting during pivoting, and safe proximity to car seat prior to sitting. After seated rest break, performed stand pivot from simulated car > w/c with RW requiring min A, cueing for safe use of RW, hand placement, and safe proximity to w/c prior to sitting. Session ended in pt room, where pt was left seated in w/c with NT present to assist pt to bathroom.  Therapy Documentation Precautions:  Precautions Precautions: Fall Restrictions Weight Bearing Restrictions: No Vital Signs: Therapy Vitals Pulse Rate: 65 (Manual) BP: (!) 118/92 mmHg Pain: Pain Assessment Pain Assessment:  No/denies pain Pain Score: 0-No pain  See Function Navigator for Current Functional Status.   Therapy/Group: Individual Therapy  Stefano Gaul 03/20/2015, 4:26 PM

## 2015-03-20 NOTE — Progress Notes (Signed)
Gave pt sorbitol for bowels today. Last charted bowel was on 2/15. Bowel movement was watery and loose. Bowel sounds hypoactive but present in all quadrants. Reported to oncoming RN.

## 2015-03-20 NOTE — Progress Notes (Signed)
Subjective/Complaints: Pt awake. States pain is reasonably controlled. Slept well   Review of systems denies any breathing difficulties no chest pain no abdominal pain  Objective: Vital Signs: Blood pressure 149/66, pulse 66, temperature 97.9 F (36.6 C), temperature source Oral, resp. rate 17, height 5\' 4"  (1.626 m), weight 87.907 kg (193 lb 12.8 oz), SpO2 93 %. No results found. No results found for this or any previous visit (from the past 72 hour(s)).    General: No acute distress Mood and affect are appropriate Heart: irregular, irregular no rubs murmurs or extra sounds Lungs: Clear to auscultation, breathing unlabored, no rales or wheezes Abdomen: Positive bowel sounds, soft nontender to palpation, nondistended Extremities: No clubbing, cyanosis, or edema Skin: No evidence of breakdown, no evidence of rash Neurologic: Cranial nerves II through XII intact---vision limited, motor strength is 5/5 in right deltoid, bicep, tricep, grip, hip flexor, knee extensors, ankle dorsiflexor and plantar flexor 4/5 in the right deltoid, biceps, triceps, grip 3 minus/5 in the right hip flexor and extensor and ankle dorsiflexor and plantar flexor Positive ataxia finger-nose-finger testing in the right upper extremity Sensory exam normal sensation to light touch and proprioception in bilateral upper and lower extremities Oriented to person , hospital not [facility].   Musculoskeletal: Full range of motion in all 4 extremities. No joint swelling   Assessment/Plan: 1. Functional deficits secondary to  left thalamic and left mesial temporal lobe infarctwith gait disorder and cognitive deficits which require 3+ hours per day of interdisciplinary therapy in a comprehensive inpatient rehab setting. Physiatrist is providing close team supervision and 24 hour management of active medical problems listed below. Physiatrist and rehab team continue to assess barriers to discharge/monitor patient  progress toward functional and medical goals. FIM: Function - Bathing Bathing activity did not occur: Refused Position: Shower Body parts bathed by patient: Chest, Abdomen, Left arm, Right arm Body parts bathed by helper: Front perineal area, Buttocks, Right upper leg, Left upper leg, Right lower leg, Left lower leg, Back Assist Level: Touching or steadying assistance(Pt > 75%) (Max assist)  Function- Upper Body Dressing/Undressing What is the patient wearing?: Button up shirt Button up shirt - Perfomed by patient: Thread/unthread right sleeve Button up shirt - Perfomed by helper: Thread/unthread left sleeve, Pull shirt around back, Button/unbutton shirt Assist Level:  (Max assist) Function - Lower Body Dressing/Undressing What is the patient wearing?: Pants, Non-skid slipper socks, Ted Hose Position: Wheelchair/chair at [employer] - Performed by helper: Thread/unthread right underwear leg, Thread/unthread left underwear leg, Pull underwear up/down Pants- Performed by helper: Thread/unthread right pants leg, Thread/unthread left pants leg, Pull pants up/down Non-skid slipper socks- Performed by helper: Don/doff left sock, Don/doff right sock TED Hose - Performed by helper: Don/doff right TED hose, Don/doff left TED hose Assist for footwear: Dependant Assist for lower body dressing:  (Total assist)  Function - Toileting Toileting activity did not occur: Refused Toileting steps completed by patient: Adjust clothing prior to toileting Toileting steps completed by helper: Performs perineal hygiene, Adjust clothing after toileting Toileting Assistive Devices: Grab bar or rail Assist level: Touching or steadying assistance (Pt.75%)  Function - Air cabin crew transfer activity did not occur: Refused Toilet transfer assistive device: Grab bar Assist level to toilet: Moderate assist (Pt 50 - 74%/lift or lower) Assist level from toilet: Moderate assist (Pt 50 - 74%/lift or  lower)  Function - Chair/bed transfer Chair/bed transfer method: Stand pivot Chair/bed transfer assist level: Moderate assist (Pt 50 - 74%/lift or lower)  Chair/bed transfer assistive device: Bedrails, Armrests, Walker Chair/bed transfer details: Verbal cues for sequencing, Verbal cues for technique, Verbal cues for precautions/safety, Manual facilitation for weight shifting, Manual facilitation for placement  Function - Locomotion: Wheelchair Will patient use wheelchair at discharge?:  (TBD) Type: Manual Max wheelchair distance: 100 Assist Level: Touching or steadying assistance (Pt > 75%) Assist Level: Touching or steadying assistance (Pt > 75%) Wheel 150 feet activity did not occur: Safety/medical concerns Turns around,maneuvers to table,bed, and toilet,negotiates 3% grade,maneuvers on rugs and over doorsills: No Function - Locomotion: Ambulation Assistive device: Walker-rolling Max distance: 15 Assist level: Touching or steadying assistance (Pt > 75%) Walk 10 feet activity did not occur: Safety/medical concerns Assist level: Touching or steadying assistance (Pt > 75%) Walk 50 feet with 2 turns activity did not occur: Safety/medical concerns Walk 150 feet activity did not occur: Safety/medical concerns Walk 10 feet on uneven surfaces activity did not occur: Safety/medical concerns  Function - Comprehension Comprehension: Auditory Comprehension assist level: Understands complex 90% of the time/cues 10% of the time  Function - Expression Expression: Verbal Expression assist level: Expresses basic 90% of the time/requires cueing < 10% of the time.  Function - Social Interaction Social Interaction assist level: Interacts appropriately 90% of the time - Needs monitoring or encouragement for participation or interaction.  Function - Problem Solving Problem solving assist level: Solves basic 75 - 89% of the time/requires cueing 10 - 24% of the time  Function - Memory Memory assist  level: Recognizes or recalls 25 - 49% of the time/requires cueing 50 - 75% of the time Patient normally able to recall (first 3 days only): That he or she is in a hospital   Medical Problem List and Plan: 1.  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  DVT Prophylaxis/Anticoagulation: Eliquis 3. Pain Management/chronic back pain: Voltaren Gel 4 times a day, Percocet 7.5-325 mg 1 tab every 8 hours as needed 4. Hypertension/atrial fibrillation. Monitor cardiac rate. Allow permissive hypertension. Lopressor 12.5 mg twice a day, Cardizem CD 240 mg daily, rate is 66 this morning, continue current doses  -ContinueEliquis   5. Neuropsych: This patient is capable of making decisions on her own behalf,, can direct power of attorney but cannot make good financial judgments independently.she will need to have help with medication management and for safety post discharge 6. Skin/Wound Care: Routine skin checks 7. Fluids/Electrolytes/Nutrition: Routine I&O's   -encourage PO  -recheck lytes on Monday 8.Hyperlipidemia. Pravachol 9. History of breast cancer 2008.Aromasin 25 mg daily 9. 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 She has a chronic left eye lateral deviation   LOS (Days) 4 A FACE TO FACE EVALUATION WAS PERFORMED  SWARTZ,ZACHARY T 03/20/2015, 7:51 AM

## 2015-03-21 ENCOUNTER — Inpatient Hospital Stay (HOSPITAL_COMMUNITY): Payer: Medicare Other | Admitting: Occupational Therapy

## 2015-03-21 ENCOUNTER — Inpatient Hospital Stay (HOSPITAL_COMMUNITY): Payer: Medicare Other | Admitting: Physical Therapy

## 2015-03-21 ENCOUNTER — Inpatient Hospital Stay (HOSPITAL_COMMUNITY): Payer: Medicare Other | Admitting: Speech Pathology

## 2015-03-21 LAB — BASIC METABOLIC PANEL
Anion gap: 10 (ref 5–15)
BUN: 26 mg/dL — AB (ref 6–20)
CHLORIDE: 107 mmol/L (ref 101–111)
CO2: 23 mmol/L (ref 22–32)
CREATININE: 1.29 mg/dL — AB (ref 0.44–1.00)
Calcium: 8.9 mg/dL (ref 8.9–10.3)
GFR calc Af Amer: 44 mL/min — ABNORMAL LOW (ref >60)
GFR calc non Af Amer: 38 mL/min — ABNORMAL LOW (ref >60)
GLUCOSE: 103 mg/dL — AB (ref 65–99)
Potassium: 3.6 mmol/L (ref 3.5–5.1)
SODIUM: 140 mmol/L (ref 135–145)

## 2015-03-21 NOTE — Progress Notes (Signed)
Occupational Therapy Session Note  Patient Details  Name: Rose Sullivan MRN: [id number] Date of Birth: 09/04/1933  Today's Date: 03/21/2015 OT Individual Time: 1045-1200 OT Individual Time Calculation (min): 75 min    Short Term Goals: Week 1:  OT Short Term Goal 1 (Week 1): Pt will complete bathing with mod assist at sit > stand level OT Short Term Goal 2 (Week 1): Pt will complete UB dressing with min assist OT Short Term Goal 3 (Week 1): Pt will complete LB dressing with max assist at sit > stand level OT Short Term Goal 4 (Week 1): Pt will complete toilet transfer with mod assist with LRAD OT Short Term Goal 5 (Week 1): Pt will utilize RUE at nondominant level with min cues  Skilled Therapeutic Interventions/Progress Updates:    Pt seen for skilled OT to facilitate functional mobility, activity tolerance, RUE functional use. Pt received in w/c sleeping, awoke easily and agreeable to a shower. Pt's w/c taken into bathroom and pt stood with grab bars and completed stand pivot into and out of shower with min A to steady balance. Pt had great difficulty reaching to feet for bathing and dressing. Pt reports she could do this PTA.  Pt would breathe heavily or "moan" when moving. When asked if she had pain/discomfort pt just stated she was tired.  Pt taken to gym to transfer to mat. Worked on controlled sit><stands, standing balance with max cues to lift through chest and tuck hips under, and RUE coordination/AROM exercises. Good use of RUE during self care. Used RW to stand step back to bed. Pt requested to get into bed based on fatigue. Call light in reach, alarm set.  Therapy Documentation Precautions:  Precautions Precautions: Fall Restrictions Weight Bearing Restrictions: No      Pain: Pain Assessment Pain Assessment: No/denies pain Pain Score: 0-No pain ADL:  See Function Navigator for Current Functional Status.   Therapy/Group: Individual  Therapy  Tymel Conely 03/21/2015, 12:38 PM

## 2015-03-21 NOTE — Progress Notes (Signed)
Subjective/Complaints:  Patient is oriented to hospital, [facility]. Does not remember me or Dr. Naaman Plummer over the weekend. She sees Dr. Naaman Plummer in the office on a regular basis.   Review of systems denies any breathing difficulties no chest pain no abdominal pain, review limited by mental status  Objective: Vital Signs: Blood pressure 141/59, pulse 61, temperature 97.6 F (36.4 C), temperature source Oral, resp. rate 16, height 5\' 4"  (1.626 m), weight 87.907 kg (193 lb 12.8 oz), SpO2 97 %. No results found. No results found for this or any previous visit (from the past 72 hour(s)).    General: No acute distress Mood and affect are appropriate Heart: irregular, irregular no rubs murmurs or extra sounds Lungs: Clear to auscultation, breathing unlabored, no rales or wheezes Abdomen: Positive bowel sounds, soft nontender to palpation, nondistended Extremities: No clubbing, cyanosis, or edema Skin: No evidence of breakdown, no evidence of rash Neurologic: Cranial nerves II through XII intact---vision limited, motor strength is 5/5 in right deltoid, bicep, tricep, grip, hip flexor, knee extensors, ankle dorsiflexor and plantar flexor 4/5 in the right deltoid, biceps, triceps, grip 4 minus/5 in the right hip flexor and extensor and ankle dorsiflexor and plantar flexor Positive ataxia finger-nose-finger testing in the right upper extremity Sensory exam normal sensation to light touch and proprioception in bilateral upper and lower extremities Oriented to person , hospital not [facility] and stroke.   Musculoskeletal: Full range of motion in all 4 extremities. No joint swelling   Assessment/Plan: 1. Functional deficits secondary to  left thalamic and left mesial temporal lobe infarctwith gait disorder and cognitive deficits which require 3+ hours per day of interdisciplinary therapy in a comprehensive inpatient rehab setting. Physiatrist is providing close team supervision and 24 hour management  of active medical problems listed below. Physiatrist and rehab team continue to assess barriers to discharge/monitor patient progress toward functional and medical goals. FIM: Function - Bathing Bathing activity did not occur: Refused Position: Shower Body parts bathed by patient: Chest, Abdomen, Left arm, Right arm Body parts bathed by helper: Front perineal area, Buttocks, Right upper leg, Left upper leg, Right lower leg, Left lower leg, Back Assist Level: Touching or steadying assistance(Pt > 75%) (Max assist)  Function- Upper Body Dressing/Undressing What is the patient wearing?: Button up shirt Button up shirt - Perfomed by patient: Thread/unthread right sleeve Button up shirt - Perfomed by helper: Thread/unthread left sleeve, Pull shirt around back, Button/unbutton shirt Assist Level:  (Max assist) Function - Lower Body Dressing/Undressing What is the patient wearing?: Pants, Non-skid slipper socks, Ted Hose Position: Wheelchair/chair at [employer] - Performed by helper: Thread/unthread right underwear leg, Thread/unthread left underwear leg, Pull underwear up/down Pants- Performed by helper: Thread/unthread right pants leg, Thread/unthread left pants leg, Pull pants up/down Non-skid slipper socks- Performed by helper: Don/doff left sock, Don/doff right sock TED Hose - Performed by helper: Don/doff right TED hose, Don/doff left TED hose Assist for footwear: Dependant Assist for lower body dressing:  (Total assist)  Function - Toileting Toileting activity did not occur: Refused Toileting steps completed by patient: Adjust clothing prior to toileting Toileting steps completed by helper: Performs perineal hygiene, Adjust clothing after toileting, Adjust clothing prior to toileting Toileting Assistive Devices: Grab bar or rail Assist level: Touching or steadying assistance (Pt.75%)  Function - Air cabin crew transfer activity did not occur: Refused Toilet transfer  assistive device: Grab bar Assist level to toilet: Moderate assist (Pt 50 - 74%/lift or lower) Assist level from toilet:  Moderate assist (Pt 50 - 74%/lift or lower)  Function - Chair/bed transfer Chair/bed transfer method: Squat pivot Chair/bed transfer assist level: Moderate assist (Pt 50 - 74%/lift or lower) Chair/bed transfer assistive device: Bedrails, Armrests Chair/bed transfer details: Verbal cues for sequencing, Verbal cues for technique, Verbal cues for precautions/safety, Manual facilitation for weight shifting, Manual facilitation for placement  Function - Locomotion: Wheelchair Will patient use wheelchair at discharge?:  (TBD) Type: Manual Max wheelchair distance: 100 Assist Level: Touching or steadying assistance (Pt > 75%) Assist Level: Touching or steadying assistance (Pt > 75%) Wheel 150 feet activity did not occur: Safety/medical concerns Turns around,maneuvers to table,bed, and toilet,negotiates 3% grade,maneuvers on rugs and over doorsills: No Function - Locomotion: Ambulation Assistive device: Walker-rolling Max distance: 15 Assist level: Touching or steadying assistance (Pt > 75%) Walk 10 feet activity did not occur: Safety/medical concerns Assist level: Touching or steadying assistance (Pt > 75%) Walk 50 feet with 2 turns activity did not occur: Safety/medical concerns Walk 150 feet activity did not occur: Safety/medical concerns Walk 10 feet on uneven surfaces activity did not occur: Safety/medical concerns  Function - Comprehension Comprehension: Auditory Comprehension assist level: Understands complex 90% of the time/cues 10% of the time  Function - Expression Expression: Verbal Expression assist level: Expresses basic 90% of the time/requires cueing < 10% of the time.  Function - Social Interaction Social Interaction assist level: Interacts appropriately 90% of the time - Needs monitoring or encouragement for participation or interaction.  Function -  Problem Solving Problem solving assist level: Solves basic 75 - 89% of the time/requires cueing 10 - 24% of the time  Function - Memory Memory assist level: Recognizes or recalls 25 - 49% of the time/requires cueing 50 - 75% of the time Patient normally able to recall (first 3 days only): That he or she is in a hospital   Medical Problem List and Plan: 1.  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  DVT Prophylaxis/Anticoagulation: Eliquis 3. Pain Management/chronic back pain: Voltaren Gel 4 times a day, Percocet 7.5-325 mg 1 tab every 8 hours as needed 4. Hypertension/atrial fibrillation. Monitor cardiac rate. Allow permissive hypertension. Lopressor 12.5 mg twice a day, Cardizem CD 240 mg daily, rate is 61 this morning, continue current doses, parameters to hold for <60bpm  -ContinueEliquis   5. Neuropsych: This patient is capable of making decisions on her own behalf,, can direct power of attorney but cannot make good financial judgments independently.she will need to have help with medication management and for safety post discharge, we will recommend neuropsychology to evaluate cognition 6. Skin/Wound Care: Routine skin checks 7. Fluids/Electrolytes/Nutrition: Routine I&O's   -encourage PO  -await am lytes  8.Hyperlipidemia. Pravachol 9. History of breast cancer 2008.Aromasin 25 mg daily 9. 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 She has a chronic left eye lateral deviation   LOS (Days) 5 A FACE TO FACE EVALUATION WAS PERFORMED  Davonna Ertl E 03/21/2015, 6:58 AM

## 2015-03-21 NOTE — Progress Notes (Signed)
Physical Therapy Session Note  Patient Details  Name: Rose Sullivan MRN: [id number] Date of Birth: 1933-12-20  Today's Date: 03/21/2015 PT Individual Time: 0900-1000 PT Individual Time Calculation (min): 60 min   Short Term Goals: Week 1:  PT Short Term Goal 1 (Week 1): Pt will perform bed mobility minA on flat bed PT Short Term Goal 2 (Week 1): Pt will perform squat pivot transfer consistent minA PT Short Term Goal 3 (Week 1): Pt will ambulate 24' with LRAD and modA PT Short Term Goal 4 (Week 1): Pt will propel w/c 150' with minA PT Short Term Goal 5 (Week 1): Pt will demonstrate static standing tolerance of 5 min for functional ADLs  Skilled Therapeutic Interventions/Progress Updates:    Pt received seated in w/c, denies pain and agreeable to treatment. Gait with RW 2x18' with min guard and verbal cues for navigation due to visual impairments and difficulty with managing RW. Returned to room after determined pt had incontinent bladder accident. Stand pivot min guard using grab bars w/c <>toilet. Therapist removed pants/brief and donned new clothing totalA while pt seated on toilet. Pt pulled down pants, requires maxA to don pants and brief with pt not pulling clothing all the way up. Sit <>stand at sink with min guard while washing hands. In ADL apartment, performed gait for short distances 5-10' with RW to/from bed. Sit >supine with S. Supine >sit minA and verbal/tactile cues for sequencing and hand placement. Requires extended seated rest break after each mobility task due to shortness of breath, however O2 remains >97% throughout session. Returned to room totalA. Remained seated in w/c at completion of session, all needs in reach.   Therapy Documentation Precautions:  Precautions Precautions: Fall Restrictions Weight Bearing Restrictions: No Pain: Pain Assessment Pain Assessment: No/denies pain Pain Score: 0-No pain   See Function Navigator for Current Functional  Status.   Therapy/Group: Individual Therapy  Luberta Mutter 03/21/2015, 10:03 AM

## 2015-03-21 NOTE — Progress Notes (Signed)
Speech Language Pathology Discharge Summary  Patient Details  Name: Rose Sullivan MRN: [id number] Date of Birth: 07/26/1933  Today's Date: 03/21/2015 SLP Individual Time: 0800-0900 SLP Individual Time Calculation (min): 60 min   Skilled Therapeutic Interventions:  Pt was seen for skilled ST targeting cognitive goals.  Pt eating breakfast upon arrival.  SLP facilitated the session with min assist-supervision cues for sequencing and task organization for tray set up and self feeding.  When pt was finished eating, she was given 3 familiar basic self care tasks to remember to complete.  Pt was able to recall 3 out of 3 tasks with min question cues.  Min assist-supervision cues needed for task organization.  SLP facilitated the session with a basic categorical naming task targeting basic functional problem solving.  Pt was able to complete the abovementioned task with min verbal cues for mental flexibility and error awareness.  No cues needed to achieve intelligibility in conversations with SLP.  Pt is at baseline for cognition and will have 24/7 supervision at discharge.  As a result, no further ST needs are indicated at this time.      Patient has met 3 of 3 long term goals.  Patient to discharge at overall Mod level.  Reasons goals not met:  n/a  Clinical Impression/Discharge Summary:  Pt made functional gains while inpatient and is discharging from [facility] having met 3 out of 3 long term goals.   Pt is currently an overall mod assist for basic, familiar tasks due to baseline deficits characterized by poor recall of daily information, inconsistent orientation, and decreased functional problem solving.  Pt will have assistance necessary for self care and home management tasks from family at discharge.  Family and pt both endorse return to cognitive baseline.  As a result, recommend discharging pt from [facility] services.    Care Partner:  Caregiver Able to Provide Assistance: Yes  Type of Caregiver  Assistance: Physical;Cognitive  Recommendation:  None      Equipment: none recommended by SLP    Reasons for discharge: Treatment goals met   Patient/Family Agrees with Progress Made and Goals Achieved: Yes   Function:  Eating Eating   Modified Consistency Diet: No Eating Assist Level: More than reasonable amount of time;Set up assist for   Eating Set Up Assist For: Opening containers;Cutting food       Cognition Comprehension Comprehension assist level: Understands basic 90% of the time/cues < 10% of the time  Expression   Expression assist level: Expresses basic 90% of the time/requires cueing < 10% of the time.  Social Interaction Social Interaction assist level: Interacts appropriately 90% of the time - Needs monitoring or encouragement for participation or interaction.  Problem Solving Problem solving assist level: Solves basic 75 - 89% of the time/requires cueing 10 - 24% of the time  Memory Memory assist level: Recognizes or recalls 25 - 49% of the time/requires cueing 50 - 75% of the time   Emilio Math 03/21/2015, 12:59 PM

## 2015-03-22 ENCOUNTER — Inpatient Hospital Stay (HOSPITAL_COMMUNITY): Payer: Medicare Other | Admitting: Occupational Therapy

## 2015-03-22 ENCOUNTER — Inpatient Hospital Stay (HOSPITAL_COMMUNITY): Payer: Medicare Other | Admitting: Physical Therapy

## 2015-03-22 LAB — GLUCOSE, CAPILLARY: Glucose-Capillary: 128 mg/dL — ABNORMAL HIGH (ref 65–99)

## 2015-03-22 NOTE — Progress Notes (Signed)
Physical Therapy Session Note  Patient Details  Name: Rose Sullivan MRN: [id number] Date of Birth: April 09, 1933  Today's Date: 03/22/2015 PT Individual Time: 0900-1000 PT Individual Time Calculation (min): 60 min   Short Term Goals: Week 1:  PT Short Term Goal 1 (Week 1): Pt will perform bed mobility minA on flat bed PT Short Term Goal 2 (Week 1): Pt will perform squat pivot transfer consistent minA PT Short Term Goal 3 (Week 1): Pt will ambulate 3' with LRAD and modA PT Short Term Goal 4 (Week 1): Pt will propel w/c 150' with minA PT Short Term Goal 5 (Week 1): Pt will demonstrate static standing tolerance of 5 min for functional ADLs  Skilled Therapeutic Interventions/Progress Updates:    Pt received seated in w/c, denies pain and agreeable to treatment. Pt reports "feeling off", states she can't explain how she feels she "just doesn't feel right"; vitals assessed WNL BP 135/64 and O2 100%. Gait in gym 2 x 25', one trial x50' including turns with min guard overall. Sit<>stand from edge of mat table with beach ball in hands x5 reps for reduced reliance on UEs and LE strengthening. Standing balance with min guard while throwing bean bags to match in piles; performed for dynamic standing balance with UE reaching overhead to improve upright posture. Returned to room per pt request to use restroom. Stand pivot standbyA using grab bars. Pt able to doff pants/brief and perform hygiene with S. Required totalA to remove dirty brief and don new one. Remained seated in w/c at completion of session, quick release belt intact and all needs within reach. Assisted pt with calling her daughter, who requested to speak with therapist. Updated daughter on pt progress and anticipated length of stay.   Therapy Documentation Precautions:  Precautions Precautions: Fall Restrictions Weight Bearing Restrictions: No Pain: Pain Assessment Pain Assessment: No/denies pain Pain Score: 0-No pain   See Function  Navigator for Current Functional Status.   Therapy/Group: Individual Therapy  Luberta Mutter 03/22/2015, 10:58 AM

## 2015-03-22 NOTE — Progress Notes (Signed)
Occupational Therapy Session Note  Patient Details  Name: Rose Sullivan MRN: [id number] Date of Birth: 23-Apr-1933  Today's Date: 03/22/2015 OT Individual Time: [id number] Individual Time Calculation (min): 44 min    Short Term Goals: Week 1:  OT Short Term Goal 1 (Week 1): Pt will complete bathing with mod assist at sit > stand level OT Short Term Goal 2 (Week 1): Pt will complete UB dressing with min assist OT Short Term Goal 3 (Week 1): Pt will complete LB dressing with max assist at sit > stand level OT Short Term Goal 4 (Week 1): Pt will complete toilet transfer with mod assist with LRAD OT Short Term Goal 5 (Week 1): Pt will utilize RUE at nondominant level with min cues  Skilled Therapeutic Interventions/Progress Updates:  Upon entering the room, pt supine in bed and finishing lunch. Pt with no c/o pain this session. Pt performed supine >sit with steady assistance to EOB. Sit <>stand with min A. Pt ambulated short distance of 15' with steady assistance into bathroom for toileting. Pt required verbal cues to advance RW as she got closer to transfer location. Pt requiring assistance for clothing management after toileting. Pt ambulated out of bathroom and to sink where she sat secondary to fatigue and washed hands with set up A to obtain items. Pt returned to wheelchair and OT educated pt on stroke risk factors and secondary stroke facts. Pt answered questions as appropriate. She remained in wheelchair at end of session with quick release belt donned and call bell within reach.   Therapy Documentation Precautions:  Precautions Precautions: Fall Restrictions Weight Bearing Restrictions: No Pain: Pain Assessment Pain Assessment: No/denies pain Pain Score: 0-No pain  See Function Navigator for Current Functional Status.   Therapy/Group: Individual Therapy  Phineas Semen 03/22/2015, 1:47 PM

## 2015-03-22 NOTE — Progress Notes (Signed)
Occupational Therapy Session Note  Patient Details  Name: FOLASADE MARENTETTE MRN: [id number] Date of Birth: 07/27/1933  Today's Date: 03/22/2015 OT Individual Time: [id number] and 1400-1430 OT Individual Time Calculation (min): 70 min and 30 min   Short Term Goals: Week 1:  OT Short Term Goal 1 (Week 1): Pt will complete bathing with mod assist at sit > stand level OT Short Term Goal 2 (Week 1): Pt will complete UB dressing with min assist OT Short Term Goal 3 (Week 1): Pt will complete LB dressing with max assist at sit > stand level OT Short Term Goal 4 (Week 1): Pt will complete toilet transfer with mod assist with LRAD OT Short Term Goal 5 (Week 1): Pt will utilize RUE at nondominant level with min cues  Skilled Therapeutic Interventions/Progress Updates:    1) Treatment session with focus on functional transfers, sit > stand, upright standing posture, and functional use of dominant RUE.  Squat pivot transfer bed > w/c with min assist with tactile and verbal cues for sequencing and hand placement as pt attempts to pull up on therapist.  Setup for breakfast with pt utilizing RUE as dominant UE with self-feeding, requiring assist to cut food and open containers.  Pt declined bathing and dressing this session, however completed grooming tasks with setup assist.  Engaged in sit > stand in therapy gym with focus on hand placement, anterior weight shift, and upright standing with tactile cues on hips and buttocks to promote tucking of buttocks to facilitate upright standing.  Engaged in reaching activity to further facilitate upright standing balance and incorporate use of RUE.  Returned to w/c squat pivot as above.  2) Treatment session with focus on sit > stand and dynamic standing balance. Engaged in reaching activity in standing with focus on upright standing balance and control while standing.  Therapist provided tactile cues and manual facilitation and hips and buttocks to facilitate upright  standing.  Incorporated reaching into task to challenge standing balance with pt requiring multimodal cues to return to upright standing.  Pt required multiple seated rest breaks.  Returned to bed at end of session with mod assist squat pivot.  Therapy Documentation Precautions:  Precautions Precautions: Fall Restrictions Weight Bearing Restrictions: No Pain: Pain Assessment Pain Assessment: No/denies pain Pain Score: 0-No pain  See Function Navigator for Current Functional Status.   Therapy/Group: Individual Therapy  Simonne Come 03/22/2015, 10:03 AM

## 2015-03-22 NOTE — Progress Notes (Signed)
Subjective/Complaints: Pt without new issues Pt working with OT this am   Review of systems denies any breathing difficulties no chest pain no abdominal pain, review limited by mental status  Objective: Vital Signs: Blood pressure 148/69, pulse 74, temperature 97.5 F (36.4 C), temperature source Oral, resp. rate 74, height 5' 4"  (1.626 m), weight 87.907 kg (193 lb 12.8 oz), SpO2 95 %. No results found. Results for orders placed or performed during the hospital encounter of 03/16/15 (from the past 72 hour(s))  Basic metabolic panel     Status: Abnormal   Collection Time: 03/21/15  7:47 AM  Result Value Ref Range   Sodium 140 135 - 145 mmol/L   Potassium 3.6 3.5 - 5.1 mmol/L   Chloride 107 101 - 111 mmol/L   CO2 23 22 - 32 mmol/L   Glucose, Bld 103 (H) 65 - 99 mg/dL   BUN 26 (H) 6 - 20 mg/dL   Creatinine, Ser 1.29 (H) 0.44 - 1.00 mg/dL   Calcium 8.9 8.9 - 10.3 mg/dL   GFR calc non Af Amer 38 (L) >60 mL/min   GFR calc Af Amer 44 (L) >60 mL/min    Comment: (NOTE) The eGFR has been calculated using the CKD EPI equation. This calculation has not been validated in all clinical situations. eGFR's persistently <60 mL/min signify possible Chronic Kidney Disease.    Anion gap 10 5 - 15      General: No acute distress Mood and affect are appropriate Heart: irregular, irregular no rubs murmurs or extra sounds Lungs: Clear to auscultation, breathing unlabored, no rales or wheezes Abdomen: Positive bowel sounds, soft nontender to palpation, nondistended Extremities: No clubbing, cyanosis, or edema Skin: No evidence of breakdown, no evidence of rash Neurologic: Cranial nerves II through XII intact---vision limited, motor strength is 5/5 in right deltoid, bicep, tricep, grip, hip flexor, knee extensors, ankle dorsiflexor and plantar flexor 4/5 in the right deltoid, biceps, triceps, grip 4 minus/5 in the right hip flexor and extensor and ankle dorsiflexor and plantar flexor Positive  ataxia finger-nose-finger testing in the right upper extremity Sensory exam normal sensation to light touch and proprioception in bilateral upper and lower extremities Oriented to person , hospital , cone and stroke. Not day or date or month  Musculoskeletal: Full range of motion in all 4 extremities. No joint swelling   Assessment/Plan: 1. Functional deficits secondary to  left thalamic and left mesial temporal lobe infarctwith gait disorder and cognitive deficits which require 3+ hours per day of interdisciplinary therapy in a comprehensive inpatient rehab setting. Physiatrist is providing close team supervision and 24 hour management of active medical problems listed below. Physiatrist and rehab team continue to assess barriers to discharge/monitor patient progress toward functional and medical goals. FIM: Function - Bathing Bathing activity did not occur: Refused Position: Shower Body parts bathed by patient: Chest, Abdomen, Left arm, Right arm, Front perineal area, Right upper leg, Left upper leg Body parts bathed by helper: Right lower leg, Left lower leg, Back, Buttocks Assist Level:  (Max assist)  Function- Upper Body Dressing/Undressing What is the patient wearing?: Pull over shirt/dress Pull over shirt/dress - Perfomed by patient: Thread/unthread right sleeve, Thread/unthread left sleeve Pull over shirt/dress - Perfomed by helper: Put head through opening, Pull shirt over trunk Button up shirt - Perfomed by patient: Thread/unthread right sleeve Button up shirt - Perfomed by helper: Thread/unthread left sleeve, Pull shirt around back, Button/unbutton shirt Assist Level:  (Max assist) Function - Lower Body Dressing/Undressing  What is the patient wearing?: Pants, Non-skid slipper socks, Maryln Manuel, Underwear Position: Wheelchair/chair at [employer] - Performed by helper: Thread/unthread right underwear leg, Thread/unthread left underwear leg, Pull underwear up/down Pants-  Performed by helper: Thread/unthread right pants leg, Thread/unthread left pants leg, Pull pants up/down Non-skid slipper socks- Performed by helper: Don/doff left sock, Don/doff right sock TED Hose - Performed by helper: Don/doff right TED hose, Don/doff left TED hose Assist for footwear: Dependant Assist for lower body dressing:  (total A)  Function - Toileting Toileting activity did not occur: Refused Toileting steps completed by patient: Adjust clothing prior to toileting Toileting steps completed by helper: Performs perineal hygiene, Adjust clothing after toileting Toileting Assistive Devices: Grab bar or rail Assist level: Touching or steadying assistance (Pt.75%)  Function - Air cabin crew transfer activity did not occur: Refused Toilet transfer assistive device: Grab bar Assist level to toilet: Touching or steadying assistance (Pt > 75%) Assist level from toilet: Touching or steadying assistance (Pt > 75%)  Function - Chair/bed transfer Chair/bed transfer activity did not occur: N/A (arrived to unit in bed) Chair/bed transfer method: Stand pivot Chair/bed transfer assist level: Touching or steadying assistance (Pt > 75%) Chair/bed transfer assistive device: Armrests, Walker Chair/bed transfer details: Verbal cues for sequencing, Verbal cues for technique, Verbal cues for precautions/safety, Manual facilitation for weight shifting, Manual facilitation for placement  Function - Locomotion: Wheelchair Will patient use wheelchair at discharge?:  (TBD) Type: Manual Max wheelchair distance: 100 Assist Level: Touching or steadying assistance (Pt > 75%) Assist Level: Touching or steadying assistance (Pt > 75%) Wheel 150 feet activity did not occur: Safety/medical concerns Turns around,maneuvers to table,bed, and toilet,negotiates 3% grade,maneuvers on rugs and over doorsills: No Function - Locomotion: Ambulation Assistive device: Walker-rolling Max distance: 18 Assist  level: Touching or steadying assistance (Pt > 75%) Walk 10 feet activity did not occur: Safety/medical concerns Assist level: Touching or steadying assistance (Pt > 75%) Walk 50 feet with 2 turns activity did not occur: Safety/medical concerns Walk 150 feet activity did not occur: Safety/medical concerns Walk 10 feet on uneven surfaces activity did not occur: Safety/medical concerns  Function - Comprehension Comprehension: Auditory Comprehension assist level: Understands basic 90% of the time/cues < 10% of the time  Function - Expression Expression: Verbal Expression assist level: Expresses basic 90% of the time/requires cueing < 10% of the time.  Function - Social Interaction Social Interaction assist level: Interacts appropriately 90% of the time - Needs monitoring or encouragement for participation or interaction.  Function - Problem Solving Problem solving assist level: Solves basic 75 - 89% of the time/requires cueing 10 - 24% of the time  Function - Memory Memory assist level: Recognizes or recalls 25 - 49% of the time/requires cueing 50 - 75% of the time Patient normally able to recall (first 3 days only): That he or she is in a hospital   Medical Problem List and Plan: 1.  Altered mental status/right hemiparesis secondary to left thalamic and left mesial temporal lobe infarct,team conf in am 2.  DVT Prophylaxis/Anticoagulation: Eliquis 3. Pain Management/chronic back pain: Voltaren Gel 4 times a day, Percocet 7.5-325 mg 1 tab every 8 hours as needed 4. Hypertension/atrial fibrillation. Monitor cardiac rate. Allow permissive hypertension. Lopressor 12.5 mg twice a day, Cardizem CD 240 mg daily, rate is 74 this morning, continue current doses, parameters to hold for <60bpm  -ContinueEliquis   5. Neuropsych: This patient is capable of making decisions on her own behalf,, can direct power  of attorney but cannot make good financial judgments independently.she will need to have help  with medication management and for safety post discharge, we will recommend neuropsychology to evaluate cognition, discussed with her outpt PMR, Dr Naaman Plummer, cog status slightly worse than baseline 6. Skin/Wound Care: Routine skin checks 7. Fluids/Electrolytes/Nutrition: Routine I&O's   -encourage PO  -BUN 26, Creat 1.29 8.Hyperlipidemia. Pravachol 9. History of breast cancer 2008.Aromasin 25 mg daily 9. 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 She has a chronic left eye lateral deviation   LOS (Days) 6 A FACE TO FACE EVALUATION WAS PERFORMED  KIRSTEINS,ANDREW E 03/22/2015, 8:22 AM

## 2015-03-23 ENCOUNTER — Inpatient Hospital Stay (HOSPITAL_COMMUNITY): Payer: Medicare Other | Admitting: Occupational Therapy

## 2015-03-23 ENCOUNTER — Inpatient Hospital Stay (HOSPITAL_COMMUNITY): Payer: Medicare Other | Admitting: Physical Therapy

## 2015-03-23 NOTE — Progress Notes (Signed)
Social Work Patient ID: Rose Sullivan, female   DOB: [birth year], 82 y.o.   MRN: [id number] Met with pt and Dot-daughter to inform team conference goals min assist level and discharge 2/28. Made both aware she will require min assist level and someone needs to be home with pt. Both nodded and daughter reports between all of them they can provide this. Pt reports feeling tired and the therapies are tiring her out. Will make referrals for follow up therapies and if any equipment needed. Both agreeable to this plan.

## 2015-03-23 NOTE — Patient Care Conference (Signed)
Inpatient RehabilitationTeam Conference and Plan of Care Update Date: 03/23/2015   Time: 11:10 AM    Patient Name: Rose Sullivan      Medical Record Number: [id number]  Date of Birth: Jun 26, 1933 Sex: Female         Room/Bed: 4W02C/4W02C-01 Payor Info: Payor: MEDICARE / Plan: MEDICARE PART A AND B / Product Type: *No Product type* /    Admitting Diagnosis: L CVA  Admit Date/Time:  03/16/2015  5:13 PM Admission Comments: No comment available   Primary Diagnosis:  <principal problem not specified> Principal Problem: <principal problem not specified>  Patient Active Problem List   Diagnosis Date Noted  . Acute ischemic VBA thalamic stroke ([facility]) 03/16/2015  . Hemiparesis affecting dominant side as late effect of stroke ([facility])   . Disorientation   . Chronic back pain   . Benign essential HTN   . Paroxysmal atrial fibrillation ([facility])   . HLD (hyperlipidemia)   . Hx of breast cancer   . Legally blind   . Atrial fibrillation with RVR ([facility]) 03/15/2015  . Acute CVA (cerebrovascular accident) ([facility]) 03/14/2015  . CKD (chronic kidney disease)   . Altered mental status   . Acute encephalopathy 03/13/2015  . DM (diabetes mellitus) ([facility]) 03/13/2015  . Cerebral embolism with cerebral infarction 03/13/2015  . Neck pain 02/09/2014  . Paresthesia of arm 02/09/2014  . Lumbar facet arthropathy 03/09/2013  . Unspecified hereditary and idiopathic peripheral neuropathy 03/09/2013  . Osteoarthritis of right hip 03/09/2013  . Osteoarthritis of right knee 03/09/2013  . Osteoarthritis of left knee 03/09/2013  . Anxiety state, unspecified 02/04/2013  . Chronic anticoagulation 11/04/2012  . Allergic rhinitis, cause unspecified 07/23/2012  . Bilateral hearing loss 06/27/2011  . Insomnia 06/27/2011  . Breast cancer metastasized to lung ([facility]) 06/05/2011  . Preventative health care 01/21/2011  . Cervical radicular pain 01/09/2011  . Lumbar radicular pain 01/09/2011  . Vitamin D deficiency 08/31/2010   . Bilateral knee pain 08/31/2010  . Impaired glucose tolerance 05/18/2010  . Hypersomnia 05/18/2010  . Nocturia 05/18/2010  . Other specified forms of hearing loss 09/05/2009  . DYSPNEA 03/18/2009  . Atrial fibrillation ([facility]) 03/07/2009  . FATIGUE 12/10/2007  . GAIT DISTURBANCE 12/10/2007  . Hyperlipidemia 02/11/2007  . Essential hypertension 02/11/2007  . GERD 02/11/2007  . OSTEOPENIA 02/11/2007  . BLINDNESS, [place] DEFINITION 11/25/2006  . LOW BACK PAIN, CHRONIC 11/25/2006    Expected Discharge Date: Expected Discharge Date: 03/29/15  Team Members Present: Physician leading conference: Dr. Alysia Penna Social Worker Present: Ovidio Kin, LCSW Nurse Present: Heather Roberts, RN PT Present: Kem Parkinson, PT OT Present: Simonne Come, OT SLP Present: Windell Moulding, SLP PPS Coordinator present : Daiva Nakayama, RN, CRRN     Current Status/Progress Goal Weekly Team Focus  Medical   minA for ADL and mobility  sup for mob except minA car xfer  D/C planning   Bowel/Bladder   Continent of bowel and bladder; LBM 2/19  Min assist  Assess and treat for constipation as needed   Swallow/Nutrition/ Hydration   Patient needs assistance with meals to have adequate intake whether it be from family or staff- not sure if this is related to vision changes?         ADL's   mod assist bathing and UB dressing, total assist LB dressing, min-mod assist transfers.  Multiple cues due to visual impairments  min assist overall  transfers, ADL retraining, standing balance, RUE NMR   Mobility   minA transfers, gait  with RW, modA car transfer  minA gait, car transfer, S transfers, bed mobility, w/c mobility in home  activity tolerance, gait training, LE strengthening   Communication     na        Safety/Cognition/ Behavioral Observations  No unsafe behaviors noted         Pain   C/o lower back pain and right hip- relieved with percocet 1 tab po q8h prn  < 4  Assess and treat for pain q  shift and prn   Skin   No skin breakdown noted  Min assist  Assess skin q shift and prn      *See Care Plan and progress notes for long and short-term goals.  Barriers to Discharge: family gives conflicting information in regards to pt functional and cognitive baseline    Possible Resolutions to Barriers:  caregiver training    Discharge Planning/Teaching Needs:  Daughter would like for pt to be able to toilet herself so she can leave here for 1-2 hours, for her errrands and appointments,      Team Discussion:  Goals-min assist level-some supervision for transfers. Working activity tolerance and strengthening. Back and hip pain limits her in therapies. Need family education make sure back to baseline functioning  Revisions to Treatment Plan:  None   Continued Need for Acute Rehabilitation Level of Care: The patient requires daily medical management by a physician with specialized training in physical medicine and rehabilitation for the following conditions: Daily direction of a multidisciplinary physical rehabilitation program to ensure safe treatment while eliciting the highest outcome that is of practical value to the patient.: Yes Daily medical management of patient stability for increased activity during participation in an intensive rehabilitation regime.: Yes Daily analysis of laboratory values and/or radiology reports with any subsequent need for medication adjustment of medical intervention for : Neurological problems;Mood/behavior problems  Caitlain Tweed, Gardiner Rhyme 03/23/2015, 12:57 PM

## 2015-03-23 NOTE — Progress Notes (Signed)
Occupational Therapy Session Note  Patient Details  Name: GRAYCELYN KINZLER MRN: [id number] Date of Birth: 1933-07-09  Today's Date: 03/23/2015 OT Individual Time: 1015-1100, 1135-1200, and 1300-1330 OT Individual Time Calculation (min): 45 min, 25 min, and 30 min   Short Term Goals: Week 1:  OT Short Term Goal 1 (Week 1): Pt will complete bathing with mod assist at sit > stand level OT Short Term Goal 2 (Week 1): Pt will complete UB dressing with min assist OT Short Term Goal 3 (Week 1): Pt will complete LB dressing with max assist at sit > stand level OT Short Term Goal 4 (Week 1): Pt will complete toilet transfer with mod assist with LRAD OT Short Term Goal 5 (Week 1): Pt will utilize RUE at nondominant level with min cues  Skilled Therapeutic Interventions/Progress Updates:    1) Treatment session with focus on activity tolerance, functional mobility, and RUE NMR.  Pt declined bathing and dressing requesting to return to bed.  Educated on purpose of therapy and OT goals with pt willing to participate.  Engaged in therapeutic activity in gym with focus on sit > stand, upright standing balance, and functional use of dominant RUE.  Pt demonstrating increased recall and carryover of appropriate hand placement with sit > stand, still requiring min cues.  Completed reaching activity with improved standing balance with therapist able to remove tactile cues to just supervision during standing and reaching task.  Pt requested to return to room due to toileting needs.  Completed stand pivot transfer with min assist, pt able to doff pants and complete hygiene with steady assist.  Returned to bed with min assist with use of RW.  2) Treatment session with focus on functional mobility and transfers.  Pt in bed upon arrival.  Discussed home setup as therapist had heard mixed reports of pt PLOF and toileting technique.  Pt reports ambulating in to bathroom to toilet as opposed to transferring to [facility].   Ambulated with RW and min/steady assist due to inconsistent movements and providing assist with RW management during navigation of threshold and turning.  Returned to bed as above and daughter, Earlie Server, arrived.  Pt's daughter confirmed pt would ambulate into bathroom without assist, however would "furniture walk" as she didn't like to use her walker.    3) Treatment session with focus on functional mobility and transfers.  Discussed bathroom setup with pt's daughter reporting tub/shower and possibility of having modifications made to remodel tub to walk-in tub.  Ambulated into ADL tubroom from door with RW and min assist.  Mod verbal cues for sequencing of tub/shower transfer with use of tub bench, pt required assist to lift both legs over tub ledge.  Pt reports feeling "shaky" during session.  Pt's daughter reports pt had a tendency to be slow moving, would sleep late, and had little activity when up. Returned to room and left upright in w/c with daughter present.  Therapy Documentation Precautions:  Precautions Precautions: Fall Restrictions Weight Bearing Restrictions: No Pain: Pain Assessment Pain Assessment: 0-10 Pain Score: 8  Pain Type: Acute pain Pain Location: Hip Pain Orientation: Right Pain Descriptors / Indicators: Aching;Sore Pain Onset: On-going Patients Stated Pain Goal: 2 Pain Intervention(s): Medication (See eMAR) Multiple Pain Sites: No  See Function Navigator for Current Functional Status.   Therapy/Group: Individual Therapy  Simonne Come 03/23/2015, 12:21 PM

## 2015-03-23 NOTE — Progress Notes (Signed)
Subjective/Complaints: Patient seen while working with PT. She is rolling herself down the hallway in the wheelchair with supervision assistance. On her way to the gym. No complaints today. She does not know who I am. She does not remember the name of her outpatient physical medicine and rehabilitation physician   Review of systems denies any breathing difficulties no chest pain no abdominal pain, review limited by mental status  Objective: Vital Signs: Blood pressure 158/64, pulse 64, temperature 97.7 F (36.5 C), temperature source Oral, resp. rate 18, height 5' 4"  (1.626 m), weight 87.907 kg (193 lb 12.8 oz), SpO2 96 %. No results found. Results for orders placed or performed during the hospital encounter of 03/16/15 (from the past 72 hour(s))  Basic metabolic panel     Status: Abnormal   Collection Time: 03/21/15  7:47 AM  Result Value Ref Range   Sodium 140 135 - 145 mmol/L   Potassium 3.6 3.5 - 5.1 mmol/L   Chloride 107 101 - 111 mmol/L   CO2 23 22 - 32 mmol/L   Glucose, Bld 103 (H) 65 - 99 mg/dL   BUN 26 (H) 6 - 20 mg/dL   Creatinine, Ser 1.29 (H) 0.44 - 1.00 mg/dL   Calcium 8.9 8.9 - 10.3 mg/dL   GFR calc non Af Amer 38 (L) >60 mL/min   GFR calc Af Amer 44 (L) >60 mL/min    Comment: (NOTE) The eGFR has been calculated using the CKD EPI equation. This calculation has not been validated in all clinical situations. eGFR's persistently <60 mL/min signify possible Chronic Kidney Disease.    Anion gap 10 5 - 15  Glucose, capillary     Status: Abnormal   Collection Time: 03/21/15  4:44 PM  Result Value Ref Range   Glucose-Capillary 128 (H) 65 - 99 mg/dL      General: No acute distress Mood and affect are appropriate Heart: irregular, irregular no rubs murmurs or extra sounds Lungs: Clear to auscultation, breathing unlabored, no rales or wheezes Abdomen: Positive bowel sounds, soft nontender to palpation, nondistended Extremities: No clubbing, cyanosis, or edema Skin:  No evidence of breakdown, no evidence of rash Neurologic: Cranial nerves II through XII intact---vision limited, motor strength is 5/5 in right deltoid, bicep, tricep, grip, hip flexor, knee extensors, ankle dorsiflexor and plantar flexor 4/5 in the right deltoid, biceps, triceps, grip 4 minus/5 in the right hip flexor and extensor and ankle dorsiflexor and plantar flexor Positive ataxia finger-nose-finger testing in the right upper extremity Sensory exam normal sensation to light touch and proprioception in bilateral upper and lower extremities Oriented to person , hospital , cone and stroke. Not day or date or month  Musculoskeletal: Full range of motion in all 4 extremities. No joint swelling   Assessment/Plan: 1. Functional deficits secondary to  left thalamic and left mesial temporal lobe infarctwith gait disorder and cognitive deficits which require 3+ hours per day of interdisciplinary therapy in a comprehensive inpatient rehab setting. Physiatrist is providing close team supervision and 24 hour management of active medical problems listed below. Physiatrist and rehab team continue to assess barriers to discharge/monitor patient progress toward functional and medical goals. FIM: Function - Bathing Bathing activity did not occur: Refused Position: Shower Body parts bathed by patient: Chest, Abdomen, Left arm, Right arm, Front perineal area, Right upper leg, Left upper leg Body parts bathed by helper: Right lower leg, Left lower leg, Back, Buttocks Assist Level:  (Max assist)  Function- Upper Body Dressing/Undressing What is  the patient wearing?: Pull over shirt/dress Pull over shirt/dress - Perfomed by patient: Thread/unthread right sleeve, Thread/unthread left sleeve Pull over shirt/dress - Perfomed by helper: Put head through opening, Pull shirt over trunk Button up shirt - Perfomed by patient: Thread/unthread right sleeve Button up shirt - Perfomed by helper: Thread/unthread left  sleeve, Pull shirt around back, Button/unbutton shirt Assist Level:  (Max assist) Function - Lower Body Dressing/Undressing What is the patient wearing?: Pants, Non-skid slipper socks, Ted Hose, Underwear Position: Wheelchair/chair at [employer] - Performed by helper: Thread/unthread right underwear leg, Thread/unthread left underwear leg, Pull underwear up/down Pants- Performed by helper: Thread/unthread right pants leg, Thread/unthread left pants leg, Pull pants up/down Non-skid slipper socks- Performed by helper: Don/doff left sock, Don/doff right sock TED Hose - Performed by helper: Don/doff right TED hose, Don/doff left TED hose Assist for footwear: Dependant Assist for lower body dressing:  (total A)  Function - Toileting Toileting activity did not occur: Refused Toileting steps completed by patient: Adjust clothing prior to toileting, Performs perineal hygiene Toileting steps completed by helper: Adjust clothing after toileting Toileting Assistive Devices: Grab bar or rail Assist level:  (mod A)  Function - Air cabin crew transfer activity did not occur: Refused Toilet transfer assistive device: Grab bar Assist level to toilet: Touching or steadying assistance (Pt > 75%) Assist level from toilet: Touching or steadying assistance (Pt > 75%)  Function - Chair/bed transfer Chair/bed transfer activity did not occur: N/A (arrived to unit in bed) Chair/bed transfer method: Squat pivot Chair/bed transfer assist level: Touching or steadying assistance (Pt > 75%) Chair/bed transfer assistive device: Armrests Chair/bed transfer details: Verbal cues for sequencing, Verbal cues for technique, Verbal cues for precautions/safety, Tactile cues for initiation, Tactile cues for sequencing, Tactile cues for weight shifting  Function - Locomotion: Wheelchair Will patient use wheelchair at discharge?:  (TBD) Type: Manual Max wheelchair distance: 100 Assist Level: Touching or  steadying assistance (Pt > 75%) Assist Level: Touching or steadying assistance (Pt > 75%) Wheel 150 feet activity did not occur: Safety/medical concerns Turns around,maneuvers to table,bed, and toilet,negotiates 3% grade,maneuvers on rugs and over doorsills: No Function - Locomotion: Ambulation Assistive device: Walker-rolling Max distance: 15 Assist level: Touching or steadying assistance (Pt > 75%) Walk 10 feet activity did not occur: Safety/medical concerns Assist level: Touching or steadying assistance (Pt > 75%) Walk 50 feet with 2 turns activity did not occur: Safety/medical concerns Assist level: Touching or steadying assistance (Pt > 75%) Walk 150 feet activity did not occur: Safety/medical concerns Walk 10 feet on uneven surfaces activity did not occur: Safety/medical concerns  Function - Comprehension Comprehension: Auditory Comprehension assist level: Understands basic 90% of the time/cues < 10% of the time  Function - Expression Expression: Verbal Expression assist level: Expresses basic 90% of the time/requires cueing < 10% of the time.  Function - Social Interaction Social Interaction assist level: Interacts appropriately 75 - 89% of the time - Needs redirection for appropriate language or to initiate interaction.  Function - Problem Solving Problem solving assist level: Solves basic 75 - 89% of the time/requires cueing 10 - 24% of the time  Function - Memory Memory assist level: Recognizes or recalls 25 - 49% of the time/requires cueing 50 - 75% of the time Patient normally able to recall (first 3 days only): That he or she is in a hospital   Medical Problem List and Plan: 1.  Altered mental status/right hemiparesis secondary to left thalamic and left mesial temporal lobe  infarct,Team conference today please see physician documentation under team conference tab, met with team face-to-face to discuss problems,progress, and goals. Formulized individual treatment plan  based on medical history, underlying problem and comorbidities. 2.  DVT Prophylaxis/Anticoagulation: Eliquis 3. Pain Management/chronic back pain: Voltaren Gel 4 times a day, Percocet 7.5-325 mg 1 tab every 8 hours as needed, Per PT chronic R Hip and Knee pain affecting mobility at times 4. Hypertension/atrial fibrillation. Monitor cardiac rate. Allow permissive hypertension. Lopressor 12.5 mg twice a day, Cardizem CD 240 mg daily, rate is 64 this morning, continue current doses, parameters to hold for <60bpm  -ContinueEliquis   5. Neuropsych: This patient is capable of making decisions on her own behalf,, can direct power of attorney but cannot make good financial judgments independently.she will need to have help with medication management and for safety post discharge, we will recommend Neuropsych 6. Skin/Wound Care: Routine skin checks 7. Fluids/Electrolytes/Nutrition: Routine I&O's   -encourage PO  -BUN 26, Creat 1.29 8.Hyperlipidemia. Pravachol 9. History of breast cancer 2008.Aromasin 25 mg daily 9. 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 She has a chronic left eye lateral deviation   LOS (Days) 7 A FACE TO FACE EVALUATION WAS PERFORMED  KIRSTEINS,ANDREW E 03/23/2015, 9:19 AM

## 2015-03-23 NOTE — Progress Notes (Signed)
Social Work Elease Hashimoto, LCSW Social Worker Signed  Patient Care Conference 03/23/2015 12:57 PM    Expand All Collapse All   Inpatient RehabilitationTeam Conference and Plan of Care Update Date: 03/23/2015   Time: 11:10 AM     Patient Name: Rose Sullivan       Medical Record Number: [id number]  Date of Birth: Jul 17, 1933 Sex: Female         Room/Bed: 4W02C/4W02C-01 Payor Info: Payor: MEDICARE / Plan: MEDICARE PART A AND B / Product Type: *No Product type* /    Admitting Diagnosis: L CVA   Admit Date/Time:  03/16/2015  5:13 PM Admission Comments: No comment available   Primary Diagnosis:  <principal problem not specified> Principal Problem: <principal problem not specified>    Patient Active Problem List     Diagnosis  Date Noted   .  Acute ischemic VBA thalamic stroke ([place])  03/16/2015   .  Hemiparesis affecting dominant side as late effect of stroke ([place])     .  Disorientation     .  Chronic back pain     .  Benign essential HTN     .  Paroxysmal atrial fibrillation ([facility])     .  HLD (hyperlipidemia)     .  Hx of breast cancer     .  Legally blind     .  Atrial fibrillation with RVR ([place])  03/15/2015   .  Acute CVA (cerebrovascular accident) ([place])  03/14/2015   .  CKD (chronic kidney disease)     .  Altered mental status     .  Acute encephalopathy  03/13/2015   .  DM (diabetes mellitus) ([place])  03/13/2015   .  Cerebral embolism with cerebral infarction  03/13/2015   .  Neck pain  02/09/2014   .  Paresthesia of arm  02/09/2014   .  Lumbar facet arthropathy  03/09/2013   .  Unspecified hereditary and idiopathic peripheral neuropathy  03/09/2013   .  Osteoarthritis of right hip  03/09/2013   .  Osteoarthritis of right knee  03/09/2013   .  Osteoarthritis of left knee  03/09/2013   .  Anxiety state, unspecified  02/04/2013   .  Chronic anticoagulation  11/04/2012   .  Allergic rhinitis, cause unspecified  07/23/2012   .  Bilateral hearing loss  06/27/2011   .   Insomnia  06/27/2011   .  Breast cancer metastasized to lung ([place])  06/05/2011   .  Preventative health care  01/21/2011   .  Cervical radicular pain  01/09/2011   .  Lumbar radicular pain  01/09/2011   .  Vitamin D deficiency  08/31/2010   .  Bilateral knee pain  08/31/2010   .  Impaired glucose tolerance  05/18/2010   .  Hypersomnia  05/18/2010   .  Nocturia  05/18/2010   .  Other specified forms of hearing loss  09/05/2009   .  DYSPNEA  03/18/2009   .  Atrial fibrillation ([place])  03/07/2009   .  FATIGUE  12/10/2007   .  GAIT DISTURBANCE  12/10/2007   .  Hyperlipidemia  02/11/2007   .  Essential hypertension  02/11/2007   .  GERD  02/11/2007   .  OSTEOPENIA  02/11/2007   .  BLINDNESS, [place] DEFINITION  11/25/2006   .  LOW BACK PAIN, CHRONIC  11/25/2006     Expected Discharge Date: Expected Discharge Date: 03/29/15  Team Members Present: Physician leading conference: Dr. Alysia Penna Social Worker Present: Ovidio Kin, LCSW Nurse Present: Heather Roberts, RN PT Present: Kem Parkinson, PT OT Present: Simonne Come, OT SLP Present: Windell Moulding, SLP PPS Coordinator present : Daiva Nakayama, RN, CRRN        Current Status/Progress  Goal  Weekly Team Focus   Medical     minA for ADL and mobility  sup for mob except minA car xfer  D/C planning   Bowel/Bladder     Continent of bowel and bladder; LBM 2/19  Min assist  Assess and treat for constipation as needed    Swallow/Nutrition/ Hydration     Patient needs assistance with meals to have adequate intake whether it be from family or staff- not sure if this is related to vision changes?          ADL's     mod assist bathing and UB dressing, total assist LB dressing, min-mod assist transfers.  Multiple cues due to visual impairments  min assist overall  transfers, ADL retraining, standing balance, RUE NMR   Mobility     minA transfers, gait with RW, modA car transfer   minA gait, car transfer, S transfers, bed mobility,  w/c mobility in home  activity tolerance, gait training, LE strengthening   Communication       na         Safety/Cognition/ Behavioral Observations    No unsafe behaviors noted         Pain     C/o lower back pain and right hip- relieved with percocet 1 tab po q8h prn  < 4  Assess and treat for pain q shift and prn   Skin     No skin breakdown noted  Min assist  Assess skin q shift and prn      *See Care Plan and progress notes for long and short-term goals.    Barriers to Discharge:  family gives conflicting information in regards to pt functional and cognitive baseline     Possible Resolutions to Barriers:   caregiver training     Discharge Planning/Teaching Needs:   Daughter would like for pt to be able to toilet herself so she can leave here for 1-2 hours, for her errrands and appointments,       Team Discussion:    Goals-min assist level-some supervision for transfers. Working activity tolerance and strengthening. Back and hip pain limits her in therapies. Need family education make sure back to baseline functioning   Revisions to Treatment Plan:    None    Continued Need for Acute Rehabilitation Level of Care: The patient requires daily medical management by a physician with specialized training in physical medicine and rehabilitation for the following conditions: Daily direction of a multidisciplinary physical rehabilitation program to ensure safe treatment while eliciting the highest outcome that is of practical value to the patient.: Yes Daily medical management of patient stability for increased activity during participation in an intensive rehabilitation regime.: Yes Daily analysis of laboratory values and/or radiology reports with any subsequent need for medication adjustment of medical intervention for : Neurological problems;Mood/behavior problems  Elease Hashimoto 03/23/2015, 12:57 PM                  Patient ID: Rose Sullivan, female   DOB: 07/18/1933,  82 y.o.   MRN: [id number]

## 2015-03-23 NOTE — Progress Notes (Addendum)
Patient sleeping at 2353, no sleeping aid needed. Will continue to round. Patient awaken around 0206 to use restroom, after bathroom visit she request for prn regimen for insomnia. Patient administered prn insomnia regimen, effective. Staff will continue to monitor and meet needs. Patient awaken at 0557 to use restroom, ambulated to the restroom with assistance and medical device (walker)

## 2015-03-23 NOTE — Progress Notes (Signed)
Physical Therapy Session Note  Patient Details  Name: Rose Sullivan MRN: [id number] Date of Birth: 1933/05/01  Today's Date: 03/23/2015 PT Individual Time: [phone] and 1345-1430 PT Individual Time Calculation (min): 60 min and 45 min (total 105 min)   Short Term Goals: Week 1:  PT Short Term Goal 1 (Week 1): Pt will perform bed mobility minA on flat bed PT Short Term Goal 2 (Week 1): Pt will perform squat pivot transfer consistent minA PT Short Term Goal 3 (Week 1): Pt will ambulate 59' with LRAD and modA PT Short Term Goal 4 (Week 1): Pt will propel w/c 150' with minA PT Short Term Goal 5 (Week 1): Pt will demonstrate static standing tolerance of 5 min for functional ADLs  Skilled Therapeutic Interventions/Progress Updates:    Tx 1: Pt received seated in w/c, c/o hip pain as described below and agreeable to treatment. W/c propulsion in hallway for UE strengthening and aerobic endurance x100' with minA due to difficulty maintaining straight trajectory. Occasional rest breaks needed due to fatigue and SOB. Gait with RW x50' and min guard; increase in standing rest breaks needed due to fatigue compared previous sessions. Also question attention to task, motivation to continue walking as pt has difficulty seeing/focusing on end destination due to visual impairments. Upon sitting, pt noted to have had incontinent bladder accident in brief. Stand pivot to return to w/c and returned to room totalA. Stand pivot with close S to toilet, with pt removing pants/brief without assist. Pt stood with grab bars while therapist washed BLEs. MaxA to don new brief and pants; pt reports she can pull up her own briefs from home more easily, but has a hard time with hospital briefs. Exchanged w/c cushion due to incontinence. Sit <>stand with S to change cushion out, and required several attempts to scoot back in chair using UE pushups on arm rests. Pt changed shirt with minA to thread RUE. Remained seated in w/c,  quick release belt intact and all needs within reach at completion of session. Encouraged pt to use call bell for any needs.  Tx 2: Pt received seated in w/c with daughter Earlie Server present; reports some hip pain but does not rate, does state it is improved over previous sessions. Gait with RW to room door and back to w/c x30' with S. After prolonged seated rest break, began performing repetitive sit <>stand however upon standing on first trial pt reports she needs to use the bathroom. Ambulated into bathroom with RW and close S. Pt doffs pants with standbyA, requires maxA for hygiene and donning brief/pants. Gait x30' again to room door, close S. Returned to sitting on EOB after trial. MinA sit >supine. Cues and facilitation for hand/foot placement to A with scooting towards HOB. Educated pt's daughter on therapy goals for improving endurance, balance, safety with short distance ambulation in home. Pt remained supine in bed at completion of session, all needs within reach.    Therapy Documentation Precautions:  Precautions Precautions: Fall Restrictions Weight Bearing Restrictions: No Pain: Pain Assessment Pain Assessment: 0-10 Pain Score: 5  Pain Type: Chronic pain Pain Location: Hip Pain Orientation: Right Pain Descriptors / Indicators: Aching;Sore Pain Onset: On-going Patients Stated Pain Goal: 2 Pain Intervention(s): Other (Comment) (pre-medicated) Multiple Pain Sites: No   See Function Navigator for Current Functional Status.   Therapy/Group: Individual Therapy  Luberta Mutter 03/23/2015, 9:39 AM

## 2015-03-24 ENCOUNTER — Inpatient Hospital Stay (HOSPITAL_COMMUNITY): Payer: Medicare Other | Admitting: Physical Therapy

## 2015-03-24 ENCOUNTER — Inpatient Hospital Stay (HOSPITAL_COMMUNITY): Payer: Medicare Other

## 2015-03-24 ENCOUNTER — Inpatient Hospital Stay (HOSPITAL_COMMUNITY): Payer: Medicare Other | Admitting: Occupational Therapy

## 2015-03-24 LAB — CBC
HEMATOCRIT: 39.4 % (ref 36.0–46.0)
HEMOGLOBIN: 13 g/dL (ref 12.0–15.0)
MCH: 30.7 pg (ref 26.0–34.0)
MCHC: 33 g/dL (ref 30.0–36.0)
MCV: 93.1 fL (ref 78.0–100.0)
Platelets: 262 10*3/uL (ref 150–400)
RBC: 4.23 MIL/uL (ref 3.87–5.11)
RDW: 12.8 % (ref 11.5–15.5)
WBC: 7.9 10*3/uL (ref 4.0–10.5)

## 2015-03-24 LAB — BASIC METABOLIC PANEL
ANION GAP: 9 (ref 5–15)
BUN: 21 mg/dL — AB (ref 6–20)
CO2: 22 mmol/L (ref 22–32)
Calcium: 8.8 mg/dL — ABNORMAL LOW (ref 8.9–10.3)
Chloride: 111 mmol/L (ref 101–111)
Creatinine, Ser: 1.23 mg/dL — ABNORMAL HIGH (ref 0.44–1.00)
GFR calc Af Amer: 47 mL/min — ABNORMAL LOW (ref >60)
GFR calc non Af Amer: 41 mL/min — ABNORMAL LOW (ref >60)
GLUCOSE: 149 mg/dL — AB (ref 65–99)
POTASSIUM: 3.5 mmol/L (ref 3.5–5.1)
Sodium: 142 mmol/L (ref 135–145)

## 2015-03-24 LAB — GLUCOSE, CAPILLARY: Glucose-Capillary: 148 mg/dL — ABNORMAL HIGH (ref 65–99)

## 2015-03-24 NOTE — Progress Notes (Signed)
Physical Therapy Weekly Progress Note  Patient Details  Name: Rose Sullivan MRN: [id number] Date of Birth: 02/15/1933  Beginning of progress report period: March 17, 2015 End of progress report period: March 24, 2015  Today's Date: 03/24/2015 PT Individual Time: 0800-0900 PT Individual Time Calculation (min): 60 min   Patient has met 4 of 5 short term goals.  Standing tolerance goal of 5 min unmet; pt stood at most 3 min 40 sec before requiring seated rest break due to fatigue. Pt is close S to min guard overall for mobility, with the exception of bed mobility occasionally requiring min/modA due to UE strength deficits.  Patient continues to demonstrate the following deficits: impaired activity tolerance, balance, postural control, ability to compensate for deficits, awareness and coordination and therefore will continue to benefit from skilled PT intervention to enhance overall performance with bed mobility, transfers, gait, independence in the home and reduce caregiver burden.  Patient progressing toward long term goals..  Continue plan of care.  PT Short Term Goals Week 1:  PT Short Term Goal 1 (Week 1): Pt will perform bed mobility minA on flat bed PT Short Term Goal 1 - Progress (Week 1): Met PT Short Term Goal 2 (Week 1): Pt will perform squat pivot transfer consistent minA PT Short Term Goal 2 - Progress (Week 1): Met PT Short Term Goal 3 (Week 1): Pt will ambulate 11' with LRAD and modA PT Short Term Goal 3 - Progress (Week 1): Met PT Short Term Goal 4 (Week 1): Pt will propel w/c 150' with minA PT Short Term Goal 4 - Progress (Week 1): Met PT Short Term Goal 5 (Week 1): Pt will demonstrate static standing tolerance of 5 min for functional ADLs Week 2:  PT Short Term Goal 1 (Week 2): =LTG due to estimated length of stay  Skilled Therapeutic Interventions/Progress Updates:    Pt received sidelying in bed, quietly saying "help! Help!", asked pt what she needed and pt  states she needs help turning over in bed. Asked pt how she would have gotten assistance if therapist had not entered the room for the session, she states "I would have just kept yelling". Educated pt repetitively throughout session on use of call bell for any needs, had pt demonstrate use of soft call bell to ensure understanding. Rolling R/L in bed with min A and facilitation for trunk rotation. L sidelying to sit with modA, cueing for hand placement and sequencing. Seated on EOB donned pants with maxA due to difficulty reaching feet, pulling pants over hips although pt assists with every step. Changed into new shirt with S. Standing at sink 3 min 40 seconds while performing teeth brushing and washing face with setupA. Gait 2 x 30' with RW and standbyA. Prolonged seated rest break between trials due to fatigue, cues for deep breathing to slow respiratory rate. Remained seated in w/c at completion of session, all needs within reach.   Therapy Documentation Precautions:  Precautions Precautions: Fall Restrictions Weight Bearing Restrictions: No Pain: Pain Assessment Pain Assessment: No/denies pain Pain Score: 0-No pain   See Function Navigator for Current Functional Status.  Therapy/Group: Individual Therapy  Luberta Mutter 03/24/2015, 12:10 PM

## 2015-03-24 NOTE — Plan of Care (Signed)
Problem: RH Dressing Goal: LTG Patient will perform lower body dressing w/assist (OT) LTG: Patient will perform lower body dressing with assist, with/without cues in positioning using equipment (OT)  Downgraded due to slow progress and pt report of PLOF

## 2015-03-24 NOTE — Progress Notes (Signed)
Occupational Therapy Weekly Progress Note  Patient Details  Name: Rose Sullivan MRN: [id number] Date of Birth: 1933-10-17  Beginning of progress report period: March 17, 2015 End of progress report period: March 24, 2015  Today's Date: 03/24/2015 OT Individual Time: 1030-1130 OT Individual Time Calculation (min): 60 min    Patient has met 4 of 5 short term goals.  Pt is making slow progress towards goals.  Pt currently requires min-max assist for stand pivot transfers depending on fatigue/arousal level.  Pt fatigues quickly and requires increased encouragement and rest breaks.    Patient continues to demonstrate the following deficits: RUE/RLE weakness, decreased activity tolerance and endurance, decreased standing balance, decreased sitting balance and therefore will continue to benefit from skilled OT intervention to enhance overall performance with BADL and Reduce care partner burden.  Patient progressing toward long term goals..  Plan of care revisions: downgraded LB dressing to mod assist.  OT Short Term Goals Week 1:  OT Short Term Goal 1 (Week 1): Pt will complete bathing with mod assist at sit > stand level OT Short Term Goal 1 - Progress (Week 1): Met OT Short Term Goal 2 (Week 1): Pt will complete UB dressing with min assist OT Short Term Goal 2 - Progress (Week 1): Met OT Short Term Goal 3 (Week 1): Pt will complete LB dressing with max assist at sit > stand level OT Short Term Goal 3 - Progress (Week 1): Progressing toward goal OT Short Term Goal 4 (Week 1): Pt will complete toilet transfer with mod assist with LRAD OT Short Term Goal 4 - Progress (Week 1): Met OT Short Term Goal 5 (Week 1): Pt will utilize RUE at nondominant level with min cues OT Short Term Goal 5 - Progress (Week 1): Met Week 2:  OT Short Term Goal 1 (Week 2): STG = LTGs due to remaining LOS  Skilled Therapeutic Interventions/Progress Updates:    ADL retraining with focus on functional  mobility, transfers, and increased participation in self-care tasks.  Pt ambulated to bathroom with RW and min assist.  Pt required increased time and cues for clothing management prior to toileting.  Ambulated to walk-in shower with RW and completed transfers with increased time and min assist.  Bathing at sit > stand level with pt able to stand while washing perineal area, but unable to wash buttocks.  Educated on LB dressing technique with pt unable to perform despite various attempts, pt reports receiving assist from one of her daughters PTA.  Pt requesting to return to bed at end of session.  Upon attempting to ambulate with RW (as min assist initially during session), pt demonstrating difficulty with sit > stand and difficulty reaching for RW with RUE.  Pt with slight tremor like movements when attempting to reach for RW and unable to maintain grasp.  Max assist squat pivot to return to bed.  Notified RN of potential change in status.    Therapy Documentation Precautions:  Precautions Precautions: Fall Restrictions Weight Bearing Restrictions: No General:   Vital Signs: Therapy Vitals Pulse Rate: 71 (radial, manual check) BP: (!) 151/72 mmHg Pain: Pain Assessment Pain Assessment: No/denies pain Pain Score: 0-No pain ADL:   Exercises:   Other Treatments:    See Function Navigator for Current Functional Status.   Therapy/Group: Individual Therapy  Simonne Come 03/24/2015, 11:20 AM

## 2015-03-24 NOTE — Progress Notes (Signed)
Subjective/Complaints: Patient seen while working with PT. She has not had a breakfast yet. She does not remember me. She does remember she is in the hospital and had a stroke but is not oriented to date.   Review of systems denies any breathing difficulties no chest pain no abdominal pain, review limited by mental status  Objective: Vital Signs: Blood pressure 177/82, pulse 66, temperature 97.9 F (36.6 C), temperature source Oral, resp. rate 20, height 5\' 4"  (1.626 m), weight 85.73 kg (189 lb), SpO2 98 %. No results found. Results for orders placed or performed during the hospital encounter of 03/16/15 (from the past 72 hour(s))  Glucose, capillary     Status: Abnormal   Collection Time: 03/21/15  4:44 PM  Result Value Ref Range   Glucose-Capillary 128 (H) 65 - 99 mg/dL      General: No acute distress Mood and affect are appropriate Heart: irregular, irregular no rubs murmurs or extra sounds Lungs: Clear to auscultation, breathing unlabored, no rales or wheezes Abdomen: Positive bowel sounds, soft nontender to palpation, nondistended Extremities: No clubbing, cyanosis, or edema Skin: No evidence of breakdown, no evidence of rash Neurologic: Cranial nerves II through XII intact---vision limited, motor strength is 5/5 in right deltoid, bicep, tricep, grip, hip flexor, knee extensors, ankle dorsiflexor and plantar flexor 4/5 in the right deltoid, biceps, triceps, grip 4 minus/5 in the right hip flexor and extensor and ankle dorsiflexor and plantar flexor  Oriented to person , hospital , cone and stroke. Not day or date or month  Musculoskeletal: Full range of motion in all 4 extremities. No joint swelling   Assessment/Plan: 1. Functional deficits secondary to  left thalamic and left mesial temporal lobe infarctwith gait disorder and cognitive deficits which require 3+ hours per day of interdisciplinary therapy in a comprehensive inpatient rehab setting. Physiatrist is providing  close team supervision and 24 hour management of active medical problems listed below. Physiatrist and rehab team continue to assess barriers to discharge/monitor patient progress toward functional and medical goals. FIM: Function - Bathing Bathing activity did not occur: Refused Position: Shower Body parts bathed by patient: Chest, Abdomen, Left arm, Right arm, Front perineal area, Right upper leg, Left upper leg Body parts bathed by helper: Right lower leg, Left lower leg, Back, Buttocks Assist Level:  (Max assist)  Function- Upper Body Dressing/Undressing What is the patient wearing?: Pull over shirt/dress Pull over shirt/dress - Perfomed by patient: Thread/unthread left sleeve, Put head through opening, Pull shirt over trunk, Thread/unthread right sleeve Pull over shirt/dress - Perfomed by helper: Thread/unthread right sleeve Button up shirt - Perfomed by patient: Thread/unthread right sleeve Button up shirt - Perfomed by helper: Thread/unthread left sleeve, Pull shirt around back, Button/unbutton shirt Assist Level: Supervision or verbal cues Function - Lower Body Dressing/Undressing What is the patient wearing?: Pants Position: Sitting EOB Underwear - Performed by helper: Thread/unthread right underwear leg, Thread/unthread left underwear leg, Pull underwear up/down Pants- Performed by helper: Thread/unthread right pants leg, Thread/unthread left pants leg, Pull pants up/down Non-skid slipper socks- Performed by helper: Don/doff left sock, Don/doff right sock TED Hose - Performed by helper: Don/doff right TED hose, Don/doff left TED hose Assist for footwear: Dependant Assist for lower body dressing:  (maxA)  Function - Toileting Toileting activity did not occur: Refused Toileting steps completed by patient: Adjust clothing prior to toileting Toileting steps completed by helper: Performs perineal hygiene, Adjust clothing after toileting Toileting Assistive Devices: Grab bar or  rail Assist level: Touching  or steadying assistance (Pt.75%)  Function - Air cabin crew transfer activity did not occur: Refused Toilet transfer assistive device: Walker, Grab bar Assist level to toilet: Touching or steadying assistance (Pt > 75%) Assist level from toilet: Touching or steadying assistance (Pt > 75%)  Function - Chair/bed transfer Chair/bed transfer activity did not occur: N/A (arrived to unit in bed) Chair/bed transfer method: Stand pivot Chair/bed transfer assist level: Supervision or verbal cues Chair/bed transfer assistive device: Walker Chair/bed transfer details: Verbal cues for sequencing, Verbal cues for technique, Verbal cues for precautions/safety, Tactile cues for initiation, Tactile cues for sequencing, Tactile cues for weight shifting  Function - Locomotion: Wheelchair Will patient use wheelchair at discharge?:  (TBD) Type: Manual Max wheelchair distance: 100 Assist Level: Touching or steadying assistance (Pt > 75%) Assist Level: Touching or steadying assistance (Pt > 75%) Wheel 150 feet activity did not occur: Safety/medical concerns Turns around,maneuvers to table,bed, and toilet,negotiates 3% grade,maneuvers on rugs and over doorsills: No Function - Locomotion: Ambulation Assistive device: Walker-rolling Max distance: 25 Assist level: Supervision or verbal cues Walk 10 feet activity did not occur: Safety/medical concerns Assist level: Supervision or verbal cues Walk 50 feet with 2 turns activity did not occur: Safety/medical concerns Assist level: Touching or steadying assistance (Pt > 75%) Walk 150 feet activity did not occur: Safety/medical concerns Walk 10 feet on uneven surfaces activity did not occur: Safety/medical concerns  Function - Comprehension Comprehension: Auditory Comprehension assist level: Understands basic 90% of the time/cues < 10% of the time  Function - Expression Expression: Verbal Expression assist level:  Expresses basic 90% of the time/requires cueing < 10% of the time.  Function - Social Interaction Social Interaction assist level: Interacts appropriately 75 - 89% of the time - Needs redirection for appropriate language or to initiate interaction.  Function - Problem Solving Problem solving assist level: Solves basic 75 - 89% of the time/requires cueing 10 - 24% of the time  Function - Memory Memory assist level: Recognizes or recalls 25 - 49% of the time/requires cueing 50 - 75% of the time Patient normally able to recall (first 3 days only): That he or she is in a hospital   Medical Problem List and Plan: 1.  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  DVT Prophylaxis/Anticoagulation: Eliquis 3. Pain Management/chronic back pain: Voltaren Gel 4 times a day, Percocet 7.5-325 mg 1 tab every 8 hours as needed, Per PT chronic R Hip and Knee pain affecting mobility at times 4. Hypertension/atrial fibrillation. Monitor cardiac rate. Allow permissive hypertension. Lopressor 12.5 mg twice a day, Cardizem CD 240 mg daily, rate is 66 this morning, continue current doses, parameters to hold for <60bpm  -ContinueEliquis   5. Neuropsych: This patient is capable of making decisions on her own behalf,, can direct power of attorney but cannot make good financial judgments independently.she will need to have help with medication management and for safety post discharge, we will recommend Neuropsych 6. Skin/Wound Care: Routine skin checks 7. Fluids/Electrolytes/Nutrition: Routine I&O's   -encourage PO  -BUN 26, Creat 1.29 8.Hyperlipidemia. Pravachol 9. History of breast cancer 2008.Aromasin 25 mg daily 9. 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 She has a chronic left eye lateral deviation   LOS (Days) 8 A FACE TO FACE EVALUATION WAS PERFORMED  KIRSTEINS,ANDREW E 03/24/2015,  8:42 AM

## 2015-03-24 NOTE — Progress Notes (Signed)
Patient less responsive verbally today than prior. Therapists note unable to move her, requiring more assistance than prior. Dan Angiulli notified. Michela Pitcher will put in orders for further evaluation. Will continue to monitor.

## 2015-03-24 NOTE — Progress Notes (Signed)
Physical Therapy Session Note  Patient Details  Name: Rose Sullivan MRN: [id number] Date of Birth: 1933/12/17  Today's Date: 03/24/2015 PT Individual Time: 1000-1028 PT Individual Time Calculation (min): 28 min   Short Term Goals: Week 2:  PT Short Term Goal 1 (Week 2): =LTG due to estimated length of stay  Skilled Therapeutic Interventions/Progress Updates:    Pt received resting in w/c and agreeable to therapy session.  Session focus on overall endurance and activity tolerance.  Pt propelled w/c x100' for UE strengthening and endurance with min assist for obstacle negotiation and supervision for straight path in a controlled environment.  Stand/pivot w/c>nustep with RW and steady assist with verbal cues for safe pivot and aligning with seat before attempting to sit.  Nustep for endurance and activity tolerance x2:18 +2:36 +0:55 +0:44 with ~30 sec to 1 min rest break in between each trial.  Pt with noted SOB throughout and reports 8/10 fatigue at worst, encouraged pt to take rest breaks and allow fatigue level to subside before returning to activity.  Stand/pivot from nustep>w/c in same manner as above and pt returned to room and end of session and positioned with QRB in place in w/c, call bell in reach and needs met.   Therapy Documentation Precautions:  Precautions Precautions: Fall Restrictions Weight Bearing Restrictions: No  See Function Navigator for Current Functional Status.   Therapy/Group: Individual Therapy  Anthonyjames Bargar E Penven-Crew 03/24/2015, 10:29 AM

## 2015-03-24 NOTE — Progress Notes (Signed)
Occupational Therapy Note  Patient Details  Name: RYLIE MCVAUGH MRN: [id number] Date of Birth: 1933-03-27  Today's Date: 03/24/2015 OT Individual Time: 1345-1400 OT Individual Time Calculation (min): 15 min  and Today's Date: 03/24/2015 OT Missed Time: 30 Minutes Missed Time Reason: Patient fatigue  Pt with no s/s pain and verbally denied pain Individual Therapy  Pt asleep in bed upon arrival with lunch tray on table at foot of bed (untouched). Pt aroused with min verbal cues but refused to sit EOB and eat lunch or participate in therapy.  Pt stated that she just wanted to sleep.  OT continued to encourage pt to eat lunch while seated EOB.  Pt continued to decline.  RN notified and reported that patient has been very lethargic since morning therapies and declined to eat lunch when encouraged by RN.    Leotis Shames [facility] 03/24/2015, 2:29 PM

## 2015-03-24 NOTE — Progress Notes (Signed)
Patient A/O, no noted distress. Patient BP evaluated, administered scheduled HTN regimen. Tolerated meds well. 2320 went to bathroom was difficulties in having a BM. Check for impaction successfully removed a medium, hard amount. Patient stated she continue to feel distress. Administered Prn regimen, it was effective. LBM 03/24/15. Patient ambulates to bathroom with minimum assist and usage of medical device (front wheel walker).

## 2015-03-25 ENCOUNTER — Inpatient Hospital Stay (HOSPITAL_COMMUNITY): Payer: Medicare Other | Admitting: Physical Therapy

## 2015-03-25 ENCOUNTER — Inpatient Hospital Stay (HOSPITAL_COMMUNITY): Payer: Medicare Other | Admitting: Occupational Therapy

## 2015-03-25 ENCOUNTER — Inpatient Hospital Stay (HOSPITAL_COMMUNITY): Payer: Medicare Other

## 2015-03-25 MED ORDER — ZOLPIDEM TARTRATE 5 MG PO TABS
5.0000 mg | ORAL_TABLET | Freq: Every evening | ORAL | Status: DC | PRN
  Administered 2015-03-25 – 2015-03-27 (×3): 5 mg via ORAL
  Filled 2015-03-25 (×3): qty 1

## 2015-03-25 NOTE — Progress Notes (Signed)
Physical Therapy Session Note  Patient Details  Name: Rose Sullivan MRN: [id number] Date of Birth: [birth year]  Today's Date: 03/25/2015 PT Individual Time: 1583-0940 and 7680-8811 PT Individual Time Calculation (min): 31 min and 48 minutes  Short Term Goals: Week 2:  PT Short Term Goal 1 (Week 2): =LTG due to estimated length of stay  Skilled Therapeutic Interventions/Progress Updates:    Treatment 1: Pt received sitting in w/c & agreeable to PT; RN present & administering medications. Pt is alert & oriented to self & location, but unable to recall date (month nor year). PT reoriented patient.  Pt assisted with transfer w/c>bed with steady assist & minimal cuing for hand placement. Treatment session focused on bed mobility. Pt able to transfer sit>supine with supervision with bed rails & bed flat, but requiring maximum verbal cues to reposition LE's & trunk to center of bed.  PT provided tactile cuing to place BUE on bed rails and to position feet to allow pt to bridge to scoot to head of bed.  Pt required CGA and increased time to complete scooting task.  PT provided Min A for rolling to Right side and cuing for hand placement on bed rails. Pt able to transfer right sidelying>sit with use of bed rails and CGA; PT had to cue pt to transfer LE's off of bed and to push with UE to come to upright sitting position. PT also provided encouragement & motivation for pt to complete task without assistance.  Pt then assisted with transferring bed>w/c with use of RW & steady assist; pt required cuing for hand placement to complete sit>stand.  Pt reported RUE numbness/tingling & this PT notified RN.  Pt's BP = 143/69 mmHg. At the end of the session pt left in w/c with QRB in place & all needs met.  PT educated pt on use of call bell for assistance as needed; pt able to return demonstrate appropriate use of call bell.    Treatment 2: Pt received in bed & agreeable to PT.  Pt required moderate verbal cuing to  transfer out of bed.  Required CGA for rolling to R and supervision R sidelying>sit with bed rails. Pt transferred bed>w/c with RW & steady assist.  Pt propelled herself ~100 ft with BUE in w/c.  Pt required cuing for equal pushes with BUE and cuing for linear path.  Pt performed standing task & was able to stand for 4 minutes 10 seconds but required maximum verbal cuing from PT, as she was eager to sit during activity.  After seated rest break pt was able to tolerate standing for an additional 2 minutes 20 seconds.  Activity focused on increasing pt's endurance.  Pt then returned to room via w/c total A.  Pt reported 8/10 neck pain & nursing notified.  BP = 106/60 mmHg, HR = 69 bpm, SpO2 = 97%.  Pt left in w/c with QRB in place & all needs within reach.   Therapy Documentation Precautions:  Precautions Precautions: Fall Restrictions Weight Bearing Restrictions: No    Vital Signs: Therapy Vitals BP: (!) 143/69 mmHg (after transferring back to w/c after bed mobility) Patient Position (if appropriate): Sitting Pain: Pain Assessment Pain Assessment: 0-10 Pain Score: 4  Pain Location: Leg Pain Orientation: Right Pain Intervention(s): Repositioned;Ambulation/increased activity   See Function Navigator for Current Functional Status.   Therapy/Group: Individual Therapy  Waunita Schooner 03/25/2015, 9:40 AM

## 2015-03-25 NOTE — Progress Notes (Addendum)
Patient A/O, no noted distress. No noted pain. Patient ask for a sleeping aid, educating her on the MD decision to D/C Administered pain regimen for BLE pain, effective. Patient was made comfortable and was able to sleep through the night. Staff will continue to monitor and meet needs.

## 2015-03-25 NOTE — Progress Notes (Deleted)
OT        :3                 04

## 2015-03-25 NOTE — Progress Notes (Signed)
Occupational Therapy Session Note  Patient Details  Name: Rose Sullivan MRN: [id number] Date of Birth: Apr 01, 1933  Today's Date: 03/25/2015 OT Individual Time: 0800-0900 OT Individual Time Calculation (min): 60 min    Short Term Goals: Week 1:  OT Short Term Goal 1 (Week 1): Pt will complete bathing with mod assist at sit > stand level OT Short Term Goal 1 - Progress (Week 1): Met OT Short Term Goal 2 (Week 1): Pt will complete UB dressing with min assist OT Short Term Goal 2 - Progress (Week 1): Met OT Short Term Goal 3 (Week 1): Pt will complete LB dressing with max assist at sit > stand level OT Short Term Goal 3 - Progress (Week 1): Progressing toward goal OT Short Term Goal 4 (Week 1): Pt will complete toilet transfer with mod assist with LRAD OT Short Term Goal 4 - Progress (Week 1): Met OT Short Term Goal 5 (Week 1): Pt will utilize RUE at nondominant level with min cues OT Short Term Goal 5 - Progress (Week 1): Met Week 2:  OT Short Term Goal 1 (Week 2): STG = LTGs due to remaining LOS  Skilled OT intervention for transfers, sit to stand, endurance while engaging in BADL.   Pt eating breakfast and completed with increased time.  Pt transferred from bed to wc with min assist.  Performed bathing and dressing at sink with sit to stand for 2-3 minutes during grooming.  Pt needed rest breaks throughout session and tended to move in a slow manner.  Left pt in wc ready for next therapy.    Therapy Documentation Precautions:  Precautions Precautions: Fall Restrictions Weight Bearing Restrictions: No      Pain: Pain Assessment Pain Assessment: No/denies pain :         See Function Navigator for Current Functional Status.   Therapy/Group: Individual Therapy  Lisa Roca 03/25/2015, 4:38 PM

## 2015-03-25 NOTE — Progress Notes (Signed)
Subjective/Complaints: Pt awaiting breakfast   Review of systems denies any breathing difficulties no chest pain no abdominal pain, review limited by mental status  Objective: Vital Signs: Blood pressure 147/65, pulse 65, temperature 97.5 F (36.4 C), temperature source Oral, resp. rate 15, height 5' 4"  (1.626 m), weight 85.73 kg (189 lb), SpO2 95 %. Ct Head Wo Contrast  03/24/2015  CLINICAL DATA:  Confusion with altered mental status. EXAM: CT HEAD WITHOUT CONTRAST TECHNIQUE: Contiguous axial images were obtained from the base of the skull through the vertex without intravenous contrast. COMPARISON:  03/13/2015 FINDINGS: There is no evidence for acute hemorrhage, hydrocephalus, mass lesion, or abnormal extra-axial fluid collection. No definite CT evidence for acute infarction. Diffuse loss of parenchymal volume is consistent with atrophy. Patchy low attenuation in the deep hemispheric and periventricular white matter is nonspecific, but likely reflects chronic microvascular ischemic demyelination. The visualized paranasal sinuses and mastoid air cells are clear. IMPRESSION: Stable exam.  No acute intracranial abnormality. Atrophy with chronic small vessel white matter ischemic demyelination. Electronically Signed   By: Misty Stanley M.D.   On: 03/24/2015 17:19   Results for orders placed or performed during the hospital encounter of 03/16/15 (from the past 72 hour(s))  Glucose, capillary     Status: Abnormal   Collection Time: 03/23/15  8:39 PM  Result Value Ref Range   Glucose-Capillary 148 (H) 65 - 99 mg/dL  Basic metabolic panel     Status: Abnormal   Collection Time: 03/24/15  3:41 PM  Result Value Ref Range   Sodium 142 135 - 145 mmol/L   Potassium 3.5 3.5 - 5.1 mmol/L   Chloride 111 101 - 111 mmol/L   CO2 22 22 - 32 mmol/L   Glucose, Bld 149 (H) 65 - 99 mg/dL   BUN 21 (H) 6 - 20 mg/dL   Creatinine, Ser 1.23 (H) 0.44 - 1.00 mg/dL   Calcium 8.8 (L) 8.9 - 10.3 mg/dL   GFR calc non  Af Amer 41 (L) >60 mL/min   GFR calc Af Amer 47 (L) >60 mL/min    Comment: (NOTE) The eGFR has been calculated using the CKD EPI equation. This calculation has not been validated in all clinical situations. eGFR's persistently <60 mL/min signify possible Chronic Kidney Disease.    Anion gap 9 5 - 15  CBC     Status: None   Collection Time: 03/24/15  3:41 PM  Result Value Ref Range   WBC 7.9 4.0 - 10.5 K/uL   RBC 4.23 3.87 - 5.11 MIL/uL   Hemoglobin 13.0 12.0 - 15.0 g/dL   HCT 39.4 36.0 - 46.0 %   MCV 93.1 78.0 - 100.0 fL   MCH 30.7 26.0 - 34.0 pg   MCHC 33.0 30.0 - 36.0 g/dL   RDW 12.8 11.5 - 15.5 %   Platelets 262 150 - 400 K/uL      General: No acute distress Mood and affect are appropriate Heart: irregular, irregular no rubs murmurs or extra sounds Lungs: Clear to auscultation, breathing unlabored, no rales or wheezes Abdomen: Positive bowel sounds, soft nontender to palpation, nondistended Extremities: No clubbing, cyanosis, or edema Skin: No evidence of breakdown, no evidence of rash Neurologic: Cranial nerves II through XII intact---vision limited, motor strength is 5/5 in right deltoid, bicep, tricep, grip, hip flexor, knee extensors, ankle dorsiflexor and plantar flexor 4/5 in the right deltoid, biceps, triceps, grip 4 minus/5 in the right hip flexor and extensor and ankle dorsiflexor and plantar flexor  Oriented to person , hospital , cone and stroke. Not day or date or month  Musculoskeletal: Full range of motion in all 4 extremities. No joint swelling   Assessment/Plan: 1. Functional deficits secondary to  left thalamic and left mesial temporal lobe infarctwith gait disorder and cognitive deficits which require 3+ hours per day of interdisciplinary therapy in a comprehensive inpatient rehab setting. Physiatrist is providing close team supervision and 24 hour management of active medical problems listed below. Physiatrist and rehab team continue to assess barriers  to discharge/monitor patient progress toward functional and medical goals. FIM: Function - Bathing Bathing activity did not occur: Refused Position: Shower Body parts bathed by patient: Chest, Abdomen, Left arm, Right arm, Front perineal area, Right upper leg, Left upper leg Body parts bathed by helper: Buttocks, Right lower leg, Left lower leg, Back Assist Level: Touching or steadying assistance(Pt > 75%) (Mod assist)  Function- Upper Body Dressing/Undressing What is the patient wearing?: Pull over shirt/dress Pull over shirt/dress - Perfomed by patient: Thread/unthread left sleeve, Put head through opening, Pull shirt over trunk, Thread/unthread right sleeve Pull over shirt/dress - Perfomed by helper: Thread/unthread right sleeve Button up shirt - Perfomed by patient: Thread/unthread right sleeve Button up shirt - Perfomed by helper: Thread/unthread left sleeve, Pull shirt around back, Button/unbutton shirt Assist Level: Supervision or verbal cues Function - Lower Body Dressing/Undressing What is the patient wearing?: Pants, Non-skid slipper socks, Underwear Position: Wheelchair/chair at sink Underwear - Performed by helper: Thread/unthread right underwear leg, Thread/unthread left underwear leg, Pull underwear up/down Pants- Performed by patient: Pull pants up/down Pants- Performed by helper: Thread/unthread right pants leg, Thread/unthread left pants leg Non-skid slipper socks- Performed by helper: Don/doff left sock, Don/doff right sock TED Hose - Performed by helper: Don/doff right TED hose, Don/doff left TED hose Assist for footwear: Dependant Assist for lower body dressing:  (Total assist)  Function - Toileting Toileting activity did not occur: Refused Toileting steps completed by patient: Adjust clothing prior to toileting, Performs perineal hygiene Toileting steps completed by helper: Adjust clothing after toileting Toileting Assistive Devices: Grab bar or rail Assist level:  More than reasonable time  Function - Air cabin crew transfer activity did not occur: Risk analyst transfer assistive device: Environmental consultant, Grab bar Assist level to toilet: Touching or steadying assistance (Pt > 75%) Assist level from toilet: Touching or steadying assistance (Pt > 75%)  Function - Chair/bed transfer Chair/bed transfer activity did not occur: N/A (arrived to unit in bed) Chair/bed transfer method: Stand pivot Chair/bed transfer assist level: Touching or steadying assistance (Pt > 75%) Chair/bed transfer assistive device: Armrests, Walker Chair/bed transfer details: Verbal cues for sequencing, Verbal cues for technique, Verbal cues for precautions/safety, Tactile cues for initiation, Tactile cues for sequencing, Tactile cues for weight shifting  Function - Locomotion: Wheelchair Will patient use wheelchair at discharge?:  (TBD) Type: Manual Max wheelchair distance: 100 Assist Level: Touching or steadying assistance (Pt > 75%) Assist Level: Touching or steadying assistance (Pt > 75%) Wheel 150 feet activity did not occur: Safety/medical concerns Turns around,maneuvers to table,bed, and toilet,negotiates 3% grade,maneuvers on rugs and over doorsills: No Function - Locomotion: Ambulation Assistive device: Walker-rolling Max distance: 40 Assist level: Supervision or verbal cues Walk 10 feet activity did not occur: Safety/medical concerns Assist level: Supervision or verbal cues Walk 50 feet with 2 turns activity did not occur: Safety/medical concerns Assist level: Touching or steadying assistance (Pt > 75%) Walk 150 feet activity did not occur: Safety/medical concerns Walk 10 feet  on uneven surfaces activity did not occur: Safety/medical concerns  Function - Comprehension Comprehension: Auditory Comprehension assist level: Understands basic 90% of the time/cues < 10% of the time  Function - Expression Expression: Verbal Expression assist level: Expresses basic  90% of the time/requires cueing < 10% of the time.  Function - Social Interaction Social Interaction assist level: Interacts appropriately 75 - 89% of the time - Needs redirection for appropriate language or to initiate interaction.  Function - Problem Solving Problem solving assist level: Solves basic 75 - 89% of the time/requires cueing 10 - 24% of the time  Function - Memory Memory assist level: Recognizes or recalls 25 - 49% of the time/requires cueing 50 - 75% of the time Patient normally able to recall (first 3 days only): That he or she is in a hospital   Medical Problem List and Plan: 1.  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  DVT Prophylaxis/Anticoagulation: Eliquis 3. Pain Management/chronic back pain: Voltaren Gel 4 times a day, Percocet 7.5-325 mg 1 tab every 8 hours as needed, Per PT chronic R Hip and Knee pain affecting mobility at times 4. Hypertension/atrial fibrillation. Monitor cardiac rate. Allow permissive hypertension. Lopressor 12.5 mg twice a day, Cardizem CD 240 mg daily, rate is 65 this morning, continue current doses, parameters to hold for <60bpm  -ContinueEliquis   5. Neuropsych: This patient is capable of making decisions on her own behalf,, can direct power of attorney but cannot make good financial judgments independently.she will need to have help with medication management and for safety post discharge, we will recommend Neuropsych 6. Skin/Wound Care: Routine skin checks 7. Fluids/Electrolytes/Nutrition: Routine I&O's   -encourage PO  -stable renal fxn BMP Latest Ref Rng 03/24/2015 03/21/2015 03/17/2015  Glucose 65 - 99 mg/dL 149(H) 103(H) 106(H)  BUN 6 - 20 mg/dL 21(H) 26(H) 29(H)  Creatinine 0.44 - 1.00 mg/dL 1.23(H) 1.29(H) 1.15(H)  Sodium 135 - 145 mmol/L 142 140 142  Potassium 3.5 - 5.1 mmol/L 3.5 3.6 3.5  Chloride 101 - 111 mmol/L 111 107 110  CO2 22 - 32 mmol/L 22 23 20(L)   Calcium 8.9 - 10.3 mg/dL 8.8(L) 8.9 8.6(L)    8.Hyperlipidemia. Pravachol 9. History of breast cancer 2008.Aromasin 25 mg daily 9. 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 She has a chronic left eye lateral deviation   LOS (Days) 9 A FACE TO FACE EVALUATION WAS PERFORMED  KIRSTEINS,ANDREW E 03/25/2015, 8:46 AM

## 2015-03-25 NOTE — Progress Notes (Signed)
Occupational Therapy Session Note  Patient Details  Name: SORINA LOWEY MRN: [id number] Date of Birth: [birth year]  Today's Date: 03/25/2015 OT Individual Time: 1330-1400 OT Individual Time Calculation (min): 30 min   Short Term Goals: Week 2:  OT Short Term Goal 1 (Week 2): STG = LTGs due to remaining LOS  Skilled Therapeutic Interventions/Progress Updates: ADL-retraining with focus on improved self-feeding, attention, and [place] of right hand.    Pt received sitting upright in bed, self-feeding with inconsistent success managing cup and fork with right hand.  OT educated pt on use of covered mug w/handle (provided) and improved manipulation of fork with right hand.  Pt required extra time and repeat of instructions with modest carry-over of skills but persevered through session, completing self-feeding with overall min assist and setup using right hand.     Therapy Documentation Precautions:  Precautions Precautions: Fall Restrictions Weight Bearing Restrictions: No   Pain: Pain Assessment Pain Assessment: No/denies pain   See Function Navigator for Current Functional Status.   Therapy/Group: Individual Therapy  [place] 03/25/2015, 2:48 PM

## 2015-03-25 NOTE — Progress Notes (Signed)
Occupational Therapy Session Note  Patient Details  Name: Rose Sullivan MRN: [id number] Date of Birth: [birth year]  Today's Date: 03/25/2015 OT Individual Time: [id number] OT Individual Time Calculation (min): 45 min    Short Term Goals: Week 2:  OT Short Term Goal 1 (Week 2): STG = LTGs due to remaining LOS  Skilled Therapeutic Interventions/Progress Updates:    Treatment session with focus on overall activity tolerance and standing balance.  Pt completed grooming tasks with setup assist seated in w/c.  Pt demonstrated increased motor control with RUE this session with ability to brush teeth without dropping toothbrush and increased coordination with cup.  Engaged in sit > stand in therapy gym with focus on anterior weight shift and upright standing.  Therapist providing tactile cues at hips and buttocks to promote hip extension to facilitate upright standing balance during reaching activity.  Pt tolerated standing 2-3 mins at a time before requiring seated rest break.  Much improved motor control in RUE with reaching and grasp/release this session (as compared to yesterday).  Pt also more chatty and interactive this session.  Pt returned to bed at end of session with min assist stand pivot transfer.  Therapy Documentation Precautions:  Precautions Precautions: Fall Restrictions Weight Bearing Restrictions: No General:   Vital Signs: Therapy Vitals Pulse Rate: 68 BP: (!) 143/69 mmHg (after transferring back to w/c after bed mobility) Patient Position (if appropriate): Sitting Pain: Pain Assessment Pain Assessment: 0-10 Pain Score: 4  Pain Location: Leg Pain Orientation: Right Pain Intervention(s): Repositioned;Ambulation/increased activity  See Function Navigator for Current Functional Status.   Therapy/Group: Individual Therapy  Simonne Come 03/25/2015, 10:37 AM

## 2015-03-25 NOTE — Progress Notes (Deleted)
Occupational Therapy Session Note  Patient Details  Name: Rose Sullivan MRN: [id number] Date of Birth: 06-09-1933  Today's Date: 03/25/2015 OT Individual Time:  -       Short Term Goals: Week 1:  OT Short Term Goal 1 (Week 1): Pt will complete bathing with mod assist at sit > stand level OT Short Term Goal 1 - Progress (Week 1): Met OT Short Term Goal 2 (Week 1): Pt will complete UB dressing with min assist OT Short Term Goal 2 - Progress (Week 1): Met OT Short Term Goal 3 (Week 1): Pt will complete LB dressing with max assist at sit > stand level OT Short Term Goal 3 - Progress (Week 1): Progressing toward goal OT Short Term Goal 4 (Week 1): Pt will complete toilet transfer with mod assist with LRAD OT Short Term Goal 4 - Progress (Week 1): Met OT Short Term Goal 5 (Week 1): Pt will utilize RUE at nondominant level with min cues OT Short Term Goal 5 - Progress (Week 1): Met Week 2:  OT Short Term Goal 1 (Week 2): STG = LTGs due to remaining LOS  Skilled Therapeutic Interventions/Progress Updates:    Pt. Presents with thefollowing deficits: RUE/RLE weakness, decreased activity tolerance and endurance, decreased standing balance, decreased sitting balance.   Skilled OT intervention to enhance overall performance with BADL and Reduce care partner burden.   Therapy Documentation Precautions:  Precautions Precautions: Fall Restrictions Weight Bearing Restrictions: No General:   Vital Signs: Therapy Vitals Temp: 97.5 F (36.4 C) Temp Source: Oral Pulse Rate: 65 Resp: 15 BP: (!) 147/65 mmHg Patient Position (if appropriate): Lying Oxygen Therapy SpO2: 95 % O2 Device: Not Delivered Pain:   ADL:   Exercises:   Other Treatments:    See Function Navigator for Current Functional Status.   Therapy/Group: Individual Therapy  Lisa Roca 03/25/2015, 7:45 AM

## 2015-03-26 ENCOUNTER — Other Ambulatory Visit: Payer: Self-pay | Admitting: Internal Medicine

## 2015-03-26 ENCOUNTER — Inpatient Hospital Stay (HOSPITAL_COMMUNITY): Payer: Medicare Other | Admitting: Occupational Therapy

## 2015-03-26 LAB — URINALYSIS, ROUTINE W REFLEX MICROSCOPIC
GLUCOSE, UA: NEGATIVE mg/dL
Hgb urine dipstick: NEGATIVE
KETONES UR: NEGATIVE mg/dL
Nitrite: NEGATIVE
PROTEIN: NEGATIVE mg/dL
Specific Gravity, Urine: 1.031 — ABNORMAL HIGH (ref 1.005–1.030)
pH: 5 (ref 5.0–8.0)

## 2015-03-26 LAB — URINE MICROSCOPIC-ADD ON

## 2015-03-26 NOTE — Progress Notes (Signed)
Occupational Therapy Session Note  Patient Details  Name: Rose Sullivan MRN: [id number] Date of Birth: 01-Nov-1933  Today's Date: 03/26/2015 OT Individual Time:  - 740 - 8:30 Total time=50 minutes     Skilled Therapeutic Interventions/Progress Updates: Patient seen for bathing and dressing and concurred to dress even though she stated she was very sleepy, tired and did not need a bath.    She was able to stand EOB balancing and supporting self on RW while clinician washed, doffed her wet Depends and pull up her depends and pants - she stated she was too tired to wash and keep her balance.  She was able to stand x 3 for about 5 minutes before posturing kphotically and complained she was tired and needed to sit down.  Patient asked to ly back down and go back to sleep at end of session.  Call bell and bed alarm were set by this clinician before leaving patient room.     Therapy Documentation Precautions:  Precautions Precautions: Fall Restrictions Weight Bearing Restrictions: No    Pain:denied    See Function Navigator for Current Functional Status.   Therapy/Group: Individual Therapy  Herschell Dimes 03/26/2015, 4:25 PM

## 2015-03-26 NOTE — Progress Notes (Signed)
   Subjective/Complaints: Pt awaiting breakfast   Review of systems denies any breathing difficulties no chest pain no abdominal pain, review limited by mental status  Objective: Vital Signs: Blood pressure 155/64, pulse 70, temperature 97.7 F (36.5 C), temperature source Oral, resp. rate 17, height 5\' 4"  (1.626 m), weight 189 lb (85.73 kg), SpO2 96 %.  Elderly female in no acute distress. Chest clear to auscultation Cardiac exam S1 and S2 are regular Abdominal exam bowel sounds, soft Extremities no significant edema  Assessment/Plan: 1. Functional deficits secondary to  left thalamic and left mesial temporal lobe infarctwith gait disorder and cognitive deficits   Medical Problem List and Plan: 1.  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  DVT Prophylaxis/Anticoagulation: Eliquis 3. Pain Management/chronic back pain: Adequately controlled. 4. Hypertension/atrial fibrillation. Monitor cardiac rate. Allow permissive  134/60-156/83 5. Neuropsych: This patient is capable of making decisions on her own behalf,, can direct power of attorney but cannot make good financial judgments independently.she will need to have help with medication management and for safety post discharge, we will recommend Neuropsych 6. Skin/Wound Care: Routine skin checks 7. Fluids/Electrolytes/Nutrition: Routine I&O's   -encourage PO  -stable renal fxn BMP Latest Ref Rng 03/24/2015 03/21/2015 03/17/2015  Glucose 65 - 99 mg/dL 149(H) 103(H) 106(H)  BUN 6 - 20 mg/dL 21(H) 26(H) 29(H)  Creatinine 0.44 - 1.00 mg/dL 1.23(H) 1.29(H) 1.15(H)  Sodium 135 - 145 mmol/L 142 140 142  Potassium 3.5 - 5.1 mmol/L 3.5 3.6 3.5  Chloride 101 - 111 mmol/L 111 107 110  CO2 22 - 32 mmol/L 22 23 20(L)  Calcium 8.9 - 10.3 mg/dL 8.8(L) 8.9 8.6(L)    8.Hyperlipidemia. Pravachol 9. History of breast cancer 2008.Aromasin 25 mg daily 9. Legally blind.    LOS (Days)  10 A [place] 03/26/2015, 9:01 AM

## 2015-03-27 ENCOUNTER — Inpatient Hospital Stay (HOSPITAL_COMMUNITY): Payer: Medicare Other | Admitting: Physical Therapy

## 2015-03-27 LAB — URINE CULTURE

## 2015-03-27 NOTE — Progress Notes (Signed)
   Subjective/Complaints: Family  Member stayed night with her- no concerns. No complaints. She denies pain. Appetite is okay.  Objective: Vital Signs: Blood pressure 175/77, pulse 67, temperature 97.9 F (36.6 C), temperature source Oral, resp. rate 18, height 5\' 4"  (1.626 m), weight 189 lb (85.73 kg), SpO2 97 %.  Elderly female in no acute distress. Chest clear to auscultation Cardiac exam S1 and S2 are regular Abdominal exam bowel sounds, soft Extremities no significant edema  Assessment/Plan: 1. Functional deficits secondary to  left thalamic and left mesial temporal lobe infarctwith gait disorder and cognitive deficits   Medical Problem List and Plan: 1.  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  DVT Prophylaxis/Anticoagulation: Eliquis 3. Pain Management/chronic back pain: Adequately controlled. 4. Hypertension/atrial fibrillation. Monitor cardiac rate. Allow permissive 122/48-175/77 5. Neuropsych: This patient is capable of making decisions on her own behalf,, can direct power of attorney but cannot make good financial judgments independently.she will need to have help with medication management and for safety post discharge, we will recommend Neuropsych 6. Skin/Wound Care: Routine skin checks 7. Fluids/Electrolytes/Nutrition: Routine I&O's   -encourage PO  -stable renal fxn BMP Latest Ref Rng 03/24/2015 03/21/2015 03/17/2015  Glucose 65 - 99 mg/dL 149(H) 103(H) 106(H)  BUN 6 - 20 mg/dL 21(H) 26(H) 29(H)  Creatinine 0.44 - 1.00 mg/dL 1.23(H) 1.29(H) 1.15(H)  Sodium 135 - 145 mmol/L 142 140 142  Potassium 3.5 - 5.1 mmol/L 3.5 3.6 3.5  Chloride 101 - 111 mmol/L 111 107 110  CO2 22 - 32 mmol/L 22 23 20(L)  Calcium 8.9 - 10.3 mg/dL 8.8(L) 8.9 8.6(L)    8.Hyperlipidemia. Pravachol 9. History of breast cancer 2008.Aromasin 25 mg daily 9. Legally blind.    LOS (Days) 11 A [place] 03/27/2015, 7:02 AM

## 2015-03-27 NOTE — Progress Notes (Signed)
Physical Therapy Session Note  Patient Details  Name: Rose Sullivan MRN: [id number] Date of Birth: 22-Apr-1933  Today's Date: 03/27/2015 PT Individual Time: 0800-0830 PT Individual Time Calculation (min): 30 min   Short Term Goals: Week 1:  PT Short Term Goal 1 (Week 1): Pt will perform bed mobility minA on flat bed PT Short Term Goal 1 - Progress (Week 1): Met PT Short Term Goal 2 (Week 1): Pt will perform squat pivot transfer consistent minA PT Short Term Goal 2 - Progress (Week 1): Met PT Short Term Goal 3 (Week 1): Pt will ambulate 69' with LRAD and modA PT Short Term Goal 3 - Progress (Week 1): Met PT Short Term Goal 4 (Week 1): Pt will propel w/c 150' with minA PT Short Term Goal 4 - Progress (Week 1): Met PT Short Term Goal 5 (Week 1): Pt will demonstrate static standing tolerance of 5 min for functional ADLs  Skilled Therapeutic Interventions/Progress Updates:  Pt was seen bedside in the am. Pt rolled R/L with bed rails and min A with verbal cues to assist donning pants. Pt transferred supine to edge of bed with bed rail and min A with verbal cues. Pt transferred sit to stand multiple times with min guard and verbal cues with rolling walker. Pt ambulated 10 feet and 20 feet x 3 with rolling walker and min guard with verbal cues. Pt propelled w/c with S and B UEs about 75 feet with increased time. Pt left sitting up in w/c with quick release belt in place and call bell within reach.   Therapy Documentation Precautions:  Precautions Precautions: Fall Restrictions Weight Bearing Restrictions: No General:   Pain: Pt c/o mild R leg pain.   See Function Navigator for Current Functional Status.   Therapy/Group: Individual Therapy  Dub Amis 03/27/2015, 10:55 AM

## 2015-03-28 ENCOUNTER — Inpatient Hospital Stay (HOSPITAL_COMMUNITY): Payer: Medicare Other | Admitting: Occupational Therapy

## 2015-03-28 ENCOUNTER — Inpatient Hospital Stay (HOSPITAL_COMMUNITY): Payer: Medicare Other | Admitting: Physical Therapy

## 2015-03-28 LAB — URINALYSIS, ROUTINE W REFLEX MICROSCOPIC
Glucose, UA: NEGATIVE mg/dL
Hgb urine dipstick: NEGATIVE
Ketones, ur: NEGATIVE mg/dL
LEUKOCYTES UA: NEGATIVE
NITRITE: NEGATIVE
PH: 5 (ref 5.0–8.0)
Protein, ur: NEGATIVE mg/dL
SPECIFIC GRAVITY, URINE: 1.029 (ref 1.005–1.030)

## 2015-03-28 MED ORDER — PRAVASTATIN SODIUM 40 MG PO TABS: 40.0000 mg | ORAL_TABLET | Freq: Every day | ORAL | Status: DC

## 2015-03-28 MED ORDER — METOPROLOL TARTRATE 25 MG PO TABS: 12.5000 mg | ORAL_TABLET | Freq: Two times a day (BID) | ORAL | Status: DC

## 2015-03-28 MED ORDER — EXEMESTANE 25 MG PO TABS: 25.0000 mg | ORAL_TABLET | Freq: Every day | ORAL | Status: DC

## 2015-03-28 MED ORDER — DILTIAZEM HCL ER COATED BEADS 240 MG PO CP24
240.0000 mg | ORAL_CAPSULE | Freq: Every day | ORAL | Status: DC
Start: 2015-03-28 — End: 2015-06-23

## 2015-03-28 MED ORDER — APIXABAN 5 MG PO TABS: 5.0000 mg | ORAL_TABLET | Freq: Two times a day (BID) | ORAL | Status: DC

## 2015-03-28 MED ORDER — OXYCODONE-ACETAMINOPHEN 7.5-325 MG PO TABS: 1.0000 | ORAL_TABLET | Freq: Three times a day (TID) | ORAL | Status: DC | PRN

## 2015-03-28 MED ORDER — DICLOFENAC SODIUM 1 % TD GEL: 2.0000 g | Freq: Four times a day (QID) | TRANSDERMAL | Status: DC

## 2015-03-28 NOTE — Discharge Summary (Signed)
Rose Sullivan, Rose Sullivan            ACCOUNT NO.:  [id number]  MEDICAL RECORD NO.:  [id number]  LOCATION:  4W02C                        FACILITY:  [facility]  PHYSICIAN:  Charlett Blake, M.D.DATE OF BIRTH:  1933/06/07  DATE OF ADMISSION:  03/16/2015 DATE OF DISCHARGE:  03/29/2015                              DISCHARGE SUMMARY   DISCHARGE DIAGNOSES: 1. Left thalamic and left mesial temporal lobe infarct. 2. Eliquis for DVT prophylaxis. 3. Pain management. 4. Hypertension with atrial fibrillation. 5. Hyperlipidemia. 6. History of breast cancer. 7. Legally blind.  HISTORY OF PRESENT ILLNESS:  This is a 82 year old,  right-handed female with history of hypertension, chronic back pain.  She is legally blind. She is followed in the clinic by Dr. Naaman Sullivan for chronic back pain.  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  One level home. Ambulated with a walker at household distances.  Presented on March 13, 2015 with altered mental status, increasing lethargy and progressive weakness over the previous couple of weeks as well as memory problems. MRI of the brain showed small acute versus early subacute infarct, left thalamus, and mesial left temporal lobe.  Extensive chronic small vessel disease.  The patient did not receive tPA.  CTA of the neck was negative.  CTA of the head showed occluded distal left P1 segment. Echocardiogram with ejection fraction of 60%.  No wall motion abnormalities.  EEG negative.  Heart rate 130s to 140s on occasion, asymptomatic, noted atrial fibrillation, maintained on Cardizem. Neurology consulted, placed on Eliquis for CVA prophylaxis as well as history of atrial fibrillation.  Physical and occupational therapy ongoing.  The patient was admitted for comprehensive rehab program.  PAST MEDICAL HISTORY:  See discharge diagnoses.  SOCIAL HISTORY:  Lives with family, used assistive device prior to admission.  Needing some encouragement to  participate with activities of daily living.  Functional status upon admission to rehab services was moderate assist sit to stand, moderate assist side lying in the bed, mod to max assist, activities of daily living.  PHYSICAL EXAMINATION:  VITAL SIGNS:  Blood pressure 152/89, pulse 75, temperature 98, respirations 20. GENERAL:  This was an alert female, flat affect.  The patient is legally blind, which limited exam. LUNGS:  Clear to auscultation.  No wheeze. CARDIAC:  Rate controlled. ABDOMEN:  Soft, nontender.  Good bowel sounds.  REHABILITATION HOSPITAL COURSE:  The patient was admitted to inpatient rehab services with therapies initiated on a 3-hour daily basis, consisting of physical therapy, occupational therapy, speech therapy and rehabilitation nursing.  The following issues were addressed during the patient's rehabilitation stay.  Pertaining to Rose Sullivan's left thalamic mesial temporal lobe infarct remained stable, maintained on Eliquis.  She would follow up with neurology Services.  Chronic back pain, she continued on Voltaren gel, Percocet as needed.  Blood pressures and heart rate controlled.  Noted history of atrial fibrillation.  She would continue on Eliquis as well as low-dose Lopressor and Cardizem.  No chest pain or shortness of breath.  She did have some bouts of lethargy, felt to possibly be sleep deprived.  CT of the head on 02/23 showed no acute abnormalities.  History of breast cancer, she remained on Aromasin  daily.  She was legally blind.  She could see figures and objects.  The patient received weekly collaborative interdisciplinary team conferences to discuss estimated length of stay, family teaching, and any barriers to discharge.  The patient rolled right-to-left with bed rails, minimal assist verbal cues. Transferred supine to edge of bed with bed rail and minimal assist and verbal cues.  Transferred sit to stand, multiple times with minimal guard,  ambulated 10 to 20 feet x3 with rolling walker, minimal guard, could propel her wheelchair.  Activities of daily living home making limited by her vision.  She was able to stand edge of bed.  Balancing and supporting herself with rolling walker, while washing.  She was able to stand for 3 minutes for dressing.  Again needed some encouragement to participate.  She was tolerating a regular diet.  Full family teaching was completed and plan discharge to home.  DISCHARGE MEDICATIONS:  Eliquis 5 mg p.o. b.i.d., Voltaren gel 4 times daily to affected area, Cardizem CD 240 mg p.o. daily, Aromasin 25 mg p.o. daily, Lopressor 12.5 mg p.o. b.i.d., Percocet 7.5/325 mg 1 tablet every 8 hours as needed for pain, dispensed 90 tablets, Pravachol 40 mg p.o. daily.  DIET:  Her diet was regular.  FOLLOWUP:  She would follow up with Dr. Alger Sullivan at the outpatient rehab service office as advised, Dr. [name], medical management Dr. Erlinda Hong, Neurology Services, call for appointment in 2 months.     Lauraine Rinne, P.A.   ______________________________ Charlett Blake, M.D.    DA/MEDQ  D:  03/28/2015  T:  03/28/2015  Job:  [id number]  cc:   Charlett Blake, M.D. Meredith Staggers, M.D. Biagio Borg, MD Erlinda Hong

## 2015-03-28 NOTE — Discharge Summary (Signed)
Discharge summary job [id number]

## 2015-03-28 NOTE — Progress Notes (Signed)
Physical Therapy Discharge Summary  Patient Details  Name: Rose Sullivan MRN: [id number] Date of Birth: November 09, 1933   Patient has met 9 of 12 long term goals due to improved activity tolerance, improved balance, improved postural control, ability to compensate for deficits, functional use of  right upper extremity and right lower extremity and improved awareness.  Patient to discharge at an ambulatory level [place].   Patient's care partner is independent to provide the necessary physical and cognitive assistance at discharge. Family reports pt is at or above baseline level before stroke; report they feel comfortable assisting her at home as needed.   Reasons goals not met: Stair goal unmet; was not a major focus of therapy sessions due to no stairs to enter home, and pt very limited during sessions due to fatigue. Bed mobility goal unmet; requires minA to min guard from flat bed.  W/c propulsion goal in controlled environment unmet due to limited activity tolerance, able to propel max of 75'.   Recommendation:  Patient will benefit from ongoing skilled PT services in home health setting to continue to advance safe functional mobility, address ongoing impairments in activity tolerance, strength, coordination, and minimize fall risk.  Equipment: No equipment provided  Reasons for discharge: treatment goals met and discharge from hospital  Patient/family agrees with progress made and goals achieved: Yes  PT Discharge Precautions/Restrictions Precautions Precautions: Fall Pain Pain Assessment Pain Assessment: No/denies pain Pain Score: Asleep Vision/Perception  Vision - Assessment Additional Comments: Pt reports she should wear glasses all the time, but does not. Her left eye turns out to far left at rest (pt cannot tell me if this is normal for her), no double vision. Pt can track in all directions with both eyes but left eye will not track full medially. She is able to find cup in  all quadrants with difficulty in right upper and lower quadrants  Cognition Overall Cognitive Status: History of cognitive impairments - at baseline Arousal/Alertness: Awake/alert Orientation Level: Oriented to person;Oriented to place;Disoriented to time;Disoriented to situation Attention: Sustained Sustained Attention: Appears intact Memory: Impaired Memory Impairment: Storage deficit;Retrieval deficit;Decreased recall of new information;Decreased short term memory Awareness: Impaired Awareness Impairment: Emergent impairment Problem Solving: Impaired Safety/Judgment: Impaired Sensation Sensation Light Touch: Impaired by gross assessment (Reports "Rt arm feels funny") Stereognosis: Not tested Hot/Cold: Not tested Proprioception: Impaired by gross assessment (difficult to formally assess due to impaired command following, however seems impaired in RUE) Coordination Gross Motor Movements are Fluid and Coordinated: No Fine Motor Movements are Fluid and Coordinated: No Finger Nose Finger Test: unable to assess 9 Hole Peg Test: did not assess due to vision Motor  Motor Motor: Hemiplegia;Abnormal postural alignment and control Motor - Discharge Observations: mild R hemiparesis, postural abnormalities  Mobility Bed Mobility Bed Mobility: Supine to Sit;Sit to Supine Supine to Sit: 5: Supervision Supine to Sit Details: Verbal cues for precautions/safety;Verbal cues for technique;Verbal cues for sequencing Sit to Supine: 4: Min assist Sit to Supine - Details: Tactile cues for placement;Verbal cues for sequencing;Verbal cues for technique Transfers Transfers: Yes Stand Pivot Transfers: 5: Supervision Stand Pivot Transfer Details: Verbal cues for precautions/safety;Verbal cues for safe use of DME/AE;Verbal cues for technique Stand Pivot Transfer Details (indicate cue type and reason): with RW Locomotion  Ambulation Ambulation: Yes Ambulation/Gait Assistance: 4: Min guard Ambulation  Distance (Feet): 50 Feet Assistive device: Rolling walker Ambulation/Gait Assistance Details: Verbal cues for technique;Verbal cues for precautions/safety;Tactile cues for posture Gait Gait: Yes Gait Pattern: Impaired Gait Pattern: Antalgic;Trunk  flexed;Right flexed knee in stance;Shuffle;Decreased dorsiflexion - left;Decreased dorsiflexion - right;Decreased stride length Gait velocity: decreased for age/gender norms Stairs / Additional Locomotion Stairs: No Architect: Yes Wheelchair Assistance: 5: Investment banker, operational Details: Verbal cues for sequencing;Verbal cues for technique;Verbal cues for safe use of DME/AE Wheelchair Propulsion: Both upper extremities Wheelchair Parts Management: Needs assistance Distance: 75  Trunk/Postural Assessment  Cervical Assessment Cervical Assessment: Within Functional Limits Thoracic Assessment Thoracic Assessment: Exceptions to [facility] (increased thoracic kyphosis, rounded shoulders) Lumbar Assessment Lumbar Assessment: Exceptions to [facility] (posterior pelvic tilt, sacral sitting) Postural Control Postural Control: Deficits on evaluation (impaired righting reactions, stepping strategies for balance recovery)  Balance Balance Balance Assessed: Yes Static Sitting Balance Static Sitting - Balance Support: Right upper extremity supported;Feet supported Static Sitting - Level of Assistance: 6: Modified independent (Device/Increase time) Dynamic Sitting Balance Dynamic Sitting - Balance Support: Feet supported;During functional activity Dynamic Sitting - Level of Assistance: 5: Stand by assistance Dynamic Sitting - Balance Activities: Lateral lean/weight shifting;Forward lean/weight shifting;Reaching for objects Static Standing Balance Static Standing - Balance Support: Bilateral upper extremity supported Static Standing - Level of Assistance: 5: Stand by assistance Dynamic Standing Balance Dynamic Standing -  Balance Support: Bilateral upper extremity supported Dynamic Standing - Level of Assistance: 5: Stand by assistance Dynamic Standing - Balance Activities: Forward lean/weight shifting;Reaching for objects Extremity Assessment  RUE Assessment RUE Assessment: Exceptions to [facility] (mild ataxia and apraxia, is worse with fatigue.  strength grossly 4-/5 throughout) LUE Assessment LUE Assessment: Exceptions to [facility] (stength grossly 4-/5, limited shoulder flexion) RLE Assessment RLE Assessment: Exceptions to [facility] (grossly 4-/5 to 4/5 throughout, limited hip ROM due to pre-morbid pain) LLE Assessment LLE Assessment: Exceptions to [facility] (grossly 4/5 throughout)   See Function Navigator for Current Functional Status.  Benjiman Core Tygielski 03/28/2015, 4:13 PM

## 2015-03-28 NOTE — Progress Notes (Signed)
Occupational Therapy Discharge Summary  Patient Details  Name: Rose Sullivan MRN: [id number] Date of Birth: November 14, 1933  Patient has met 10 of 12 long term goals due to improved activity tolerance, improved balance, ability to compensate for deficits and functional use of  RIGHT upper extremity.  Patient to discharge at [facility] Assist level, recommending use of RW with all ambulation and transfers to increase safety; when extremely fatigued recommend squat pivot transfers to [facility] or toilet from w/c. Patient's care partner is independent to provide the necessary physical and cognitive assistance at discharge.    Reasons goals not met: Pt continues to require mod assist with toileting, requiring assistance to complete hygiene while standing.  Pt is a max-total assist for LB dressing, which pt's daughter reports she could be as much when fatigued or not motivated.  Daughter reports she has provided this level of assist before and feels comfortable again.  Recommendation:  Patient will benefit from ongoing skilled OT services in home health setting to continue to advance functional skills in the area of BADL and Reduce care partner burden.  Equipment: No equipment provided  Reasons for discharge: treatment goals met and discharge from hospital  Patient/family agrees with progress made and goals achieved: Yes  OT Discharge Precautions/Restrictions  Precautions Precautions: Fall General PT Missed Treatment Reason: Patient fatigue Vital Signs Therapy Vitals Temp: 98.3 F (36.8 C) Temp Source: Other (Comment) Pulse Rate: (!) 51 Resp: 18 BP: (!) 149/62 mmHg Patient Position (if appropriate): Lying Oxygen Therapy SpO2: 97 % O2 Device: Not Delivered Pain Pain Assessment Pain Assessment: No/denies pain Pain Score: Asleep Pain Type: Acute pain Pain Location: Leg Pain Orientation: Right;Left Pain Descriptors / Indicators: Aching Pain Frequency: Intermittent Pain Onset:  Gradual Patients Stated Pain Goal: 2 Pain Intervention(s): Medication (See eMAR);Repositioned Multiple Pain Sites: No ADL  See Function Navigator Vision/Perception  Vision- History Baseline Vision/History: Legally blind Patient Visual Report: No change from baseline Vision- Assessment Vision Assessment?: Vision impaired- to be further tested in functional context Additional Comments: Pt reports she should wear glasses all the time, but does not. Her left eye turns out to far left at rest (pt cannot tell me if this is normal for her), no double vision. Pt can track in all directions with both eyes but left eye will not track full medially. She is able to find cup in all quadrants with difficulty in right upper and lower quadrants  Cognition Overall Cognitive Status: History of cognitive impairments - at baseline Arousal/Alertness: Awake/alert Orientation Level: Oriented to person;Oriented to place;Disoriented to time;Disoriented to situation Attention: Sustained Sustained Attention: Appears intact Memory: Impaired Memory Impairment: Storage deficit;Retrieval deficit;Decreased recall of new information;Decreased short term memory Awareness: Impaired Awareness Impairment: Emergent impairment Problem Solving: Impaired Safety/Judgment: Impaired Sensation Sensation Light Touch: Impaired by gross assessment (Reports "Rt arm feels funny") Stereognosis: Not tested Hot/Cold: Not tested Proprioception: Impaired by gross assessment (difficult to formally assess due to impaired command following, however seems impaired in RUE) Coordination Gross Motor Movements are Fluid and Coordinated: No Fine Motor Movements are Fluid and Coordinated: No Finger Nose Finger Test: unable to assess 9 Hole Peg Test: did not assess due to vision Extremity/Trunk Assessment RUE Assessment RUE Assessment: Exceptions to [facility] (mild ataxia and apraxia, is worse with fatigue.  strength grossly 4-/5 throughout) LUE  Assessment LUE Assessment: Exceptions to [facility] (stength grossly 4-/5, limited shoulder flexion)   See Function Navigator for Current Functional Status.  Simonne Come 03/28/2015, 3:50 PM

## 2015-03-28 NOTE — Discharge Instructions (Signed)
Inpatient Rehab Discharge Instructions  Rose Sullivan Discharge date and time: No discharge date for patient encounter.   Activities/Precautions/ Functional Status: Activity: activity as tolerated Diet: regular diet Wound Care: none needed Functional status:  ___ No restrictions     ___ Walk up steps independently ___ 24/7 supervision/assistance   ___ Walk up steps with assistance ___ Intermittent supervision/assistance  ___ Bathe/dress independently ___ Walk with walker     __x_ Bathe/dress with assistance ___ Walk Independently    ___ Shower independently _x__ Walk with assistance    ___ Shower with assistance ___ No alcohol     ___ Return to work/school ________  Special Instructions:    COMMUNITY REFERRALS UPON DISCHARGE:    Home Health:   PT, OT, RN, [place]   Date of last service:03/29/2015  Medical Equipment/Items Ordered:HAS ALL FROM PREVIOUS ADMITS    GENERAL COMMUNITY RESOURCES FOR PATIENT/FAMILY: Support Groups:[facility] SUPPORT GROUP EVERY SECOND Thursday @ 3:00-4:00 PM ON REHAB UNIT QUESTIONS CONTACT KATIE [id number]  My questions have been answered and I understand these instructions. I will adhere to these goals and the provided educational materials after my discharge from the hospital.  Patient/Caregiver Signature _______________________________ Date __________  Clinician Signature _______________________________________ Date __________  Please bring this form and your medication list with you to all your follow-up doctor's appointments.

## 2015-03-28 NOTE — Progress Notes (Signed)
Patient had trouble sleeping tonight because of frequent urination. Requested and was given Ambien at 2045. Was not able to fall asleep until around 0300. Pt urinated 5 times between 1900 and 0300, low volume. BSC for 215 or less. Took Percoset at [employer].

## 2015-03-28 NOTE — Progress Notes (Signed)
Occupational Therapy Session Note  Patient Details  Name: Rose Sullivan MRN: [id number] Date of Birth: 1934-01-23  Today's Date: 03/28/2015 OT Individual Time: 0845-1000 and 1330-1400 OT Individual Time Calculation (min): 75 min and 30 min   Short Term Goals: Week 2:  OT Short Term Goal 1 (Week 2): STG = LTGs due to remaining LOS  Skilled Therapeutic Interventions/Progress Updates:    1) ADL retraining with focus on functional mobility, transfers, and increased participation with self-care tasks.  Pt ambulated to bathroom with RW and min assist - contact guard.  Pt able to pull down pants, but unable to complete hygiene post toileting.  Completed walk-in shower transfer with RW and min assist, bathing with assist to wash buttocks with pt stabilizing against tub bench.  Ambulated to sink with RW and min assist, requiring cues to locate w/c.  Dressing completed with setup assist for UB dressing and max-total assist with LB dressing.  Pt's daughter arrived towards end of session and reports pt requiring max-total assist for LB dressing prior.  Encouraged pt to scoot back in w/c, pt demonstrated difficulty reaching towards w/c arm rest and sustaining position.  Pt requesting to return to bed due to extreme fatigue.  Pt became more flat and continued with difficulty with RUE control.  Encouraged pt to sit up in w/c as PT coming immediately after.  Discussed with daughter recommendation for min assist with transfers and reiterating the importance of the RW for increased safety, recommending BSC next to bed at night time but ambulation as tolerated to bathroom during the day.  2) Treatment session with focus on functional mobility and transfers.  Pt received from PT still extremely fatigued from not sleeping well over night and activity during AM session.  Pt's son in law present and reports that pt would fluctuate highs and lows like this at home.  Engaged in [place] transfer with min-mod assist  ambulation to tub transfer bench with RW, noting pt with increased labored breathing and difficulty stepping forward. Returned to w/c stand pivot after transfer due to fatigue.  Discussed modifications of tasks based on level of arousal/current status at home as pt very sedentary.  Still encouraging pt and pt's family members to encourage OOB activity.  Returned to room and completed stand pivot max assist to bed with max cues as unsafe to attempt with RW due to level of fatigue and increased shakiness in Rt hand.  Notified RN and PA of current status.  Therapy Documentation Precautions:  Precautions Precautions: Fall Restrictions Weight Bearing Restrictions: No Pain: Pain Assessment Pain Score: 2   See Function Navigator for Current Functional Status.   Therapy/Group: Individual Therapy  Simonne Come 03/28/2015, 12:12 PM

## 2015-03-28 NOTE — Progress Notes (Signed)
Subjective/Complaints: Freq urination overnite, denies burning or discomfort   Review of systems denies any breathing difficulties no chest pain no abdominal pain, review limited by mental status  Objective: Vital Signs: Blood pressure 135/57, pulse 62, temperature 97.9 F (36.6 C), temperature source Oral, resp. rate 17, height 5\' 4"  (1.626 m), weight 85.73 kg (189 lb), SpO2 98 %. No results found. Results for orders placed or performed during the hospital encounter of 03/16/15 (from the past 72 hour(s))  Urinalysis, Routine w reflex microscopic (not at [facility])     Status: Abnormal   Collection Time: 03/26/15 12:54 AM  Result Value Ref Range   Color, Urine AMBER (A) YELLOW    Comment: BIOCHEMICALS MAY BE AFFECTED BY COLOR   APPearance CLOUDY (A) CLEAR   Specific Gravity, Urine 1.031 (H) 1.005 - 1.030   pH 5.0 5.0 - 8.0   Glucose, UA NEGATIVE NEGATIVE mg/dL   Hgb urine dipstick NEGATIVE NEGATIVE   Bilirubin Urine SMALL (A) NEGATIVE   Ketones, ur NEGATIVE NEGATIVE mg/dL   Protein, ur NEGATIVE NEGATIVE mg/dL   Nitrite NEGATIVE NEGATIVE   Leukocytes, UA TRACE (A) NEGATIVE  Urine culture     Status: None   Collection Time: 03/26/15 12:54 AM  Result Value Ref Range   Specimen Description URINE, CLEAN CATCH    Special Requests NONE    Culture MULTIPLE SPECIES PRESENT, SUGGEST RECOLLECTION    Report Status 03/27/2015 FINAL   Urine microscopic-add on     Status: Abnormal   Collection Time: 03/26/15 12:54 AM  Result Value Ref Range   Squamous Epithelial / LPF 6-30 (A) NONE SEEN   WBC, UA 6-30 0 - 5 WBC/hpf   RBC / HPF 0-5 0 - 5 RBC/hpf   Bacteria, UA FEW (A) NONE SEEN   Casts HYALINE CASTS (A) NEGATIVE   Crystals CA OXALATE CRYSTALS (A) NEGATIVE   Urine-Other MUCOUS PRESENT       General: No acute distress Mood and affect are appropriate Heart: irregular, irregular no rubs murmurs or extra sounds Lungs: Clear to auscultation, breathing unlabored, no rales or  wheezes Abdomen: Positive bowel sounds, soft nontender to palpation, nondistended Extremities: No clubbing, cyanosis, or edema Skin: No evidence of breakdown, no evidence of rash Neurologic: Cranial nerves II through XII intact---vision limited, motor strength is 5/5 in right deltoid, bicep, tricep, grip, hip flexor, knee extensors, ankle dorsiflexor and plantar flexor 4/5 in the right deltoid, biceps, triceps, grip 4 minus/5 in the right hip flexor and extensor and ankle dorsiflexor and plantar flexor  Oriented to person , hospital , cone and stroke. Not day or date or month  Musculoskeletal: Full range of motion in all 4 extremities. No joint swelling   Assessment/Plan: 1. Functional deficits secondary to  left thalamic and left mesial temporal lobe infarctwith gait disorder and cognitive deficits which require 3+ hours per day of interdisciplinary therapy in a comprehensive inpatient rehab setting. Physiatrist is providing close team supervision and 24 hour management of active medical problems listed below. Physiatrist and rehab team continue to assess barriers to discharge/monitor patient progress toward functional and medical goals. FIM: Function - Bathing Bathing activity did not occur: Refused Position: Other (comment) (standing EOB & only wanted to wash periarea) Body parts bathed by patient: Chest, Abdomen, Left arm, Right arm, Front perineal area, Right upper leg, Left upper leg Body parts bathed by helper: Front perineal area, Buttocks Assist Level: Touching or steadying assistance(Pt > 75%)  Function- Upper Body Dressing/Undressing Upper body dressing/undressing  activity did not occur: Refused What is the patient wearing?: Pull over shirt/dress Pull over shirt/dress - Perfomed by patient: Thread/unthread left sleeve, Put head through opening, Pull shirt over trunk, Thread/unthread right sleeve Pull over shirt/dress - Perfomed by helper: Thread/unthread right sleeve Button up  shirt - Perfomed by patient: Thread/unthread right sleeve Button up shirt - Perfomed by helper: Thread/unthread left sleeve, Pull shirt around back, Button/unbutton shirt Assist Level: Supervision or verbal cues Function - Lower Body Dressing/Undressing What is the patient wearing?: Pants, Non-skid slipper socks, Underwear Position: Other (comment) (Sitting and standing EOB) Underwear - Performed by helper: Thread/unthread right underwear leg, Thread/unthread left underwear leg, Pull underwear up/down Pants- Performed by patient: Pull pants up/down Pants- Performed by helper: Thread/unthread right pants leg, Thread/unthread left pants leg Non-skid slipper socks- Performed by helper: Don/doff left sock, Don/doff right sock TED Hose - Performed by helper: Don/doff right TED hose, Don/doff left TED hose Assist for footwear: Dependant Assist for lower body dressing: Touching or steadying assistance (Pt > 75%)  Function - Toileting Toileting activity did not occur: Refused Toileting steps completed by patient: Adjust clothing prior to toileting, Performs perineal hygiene Toileting steps completed by helper: Performs perineal hygiene, Adjust clothing after toileting Toileting Assistive Devices: Grab bar or rail Assist level: More than reasonable time  Function - Air cabin crew transfer activity did not occur: Risk analyst transfer assistive device: Grab bar Assist level to toilet: Touching or steadying assistance (Pt > 75%) Assist level from toilet: Touching or steadying assistance (Pt > 75%)  Function - Chair/bed transfer Chair/bed transfer activity did not occur: N/A (arrived to unit in bed) Chair/bed transfer method: Ambulatory Chair/bed transfer assist level: Touching or steadying assistance (Pt > 75%) Chair/bed transfer assistive device: Walker Chair/bed transfer details: Verbal cues for sequencing, Verbal cues for technique  Function - Locomotion: Wheelchair Will patient  use wheelchair at discharge?: Yes Type: Manual Max wheelchair distance: 75 Assist Level: Supervision or verbal cues Assist Level: Supervision or verbal cues Wheel 150 feet activity did not occur: Safety/medical concerns Turns around,maneuvers to table,bed, and toilet,negotiates 3% grade,maneuvers on rugs and over doorsills: No Function - Locomotion: Ambulation Assistive device: Walker-rolling Max distance: 20 Assist level: Touching or steadying assistance (Pt > 75%) Walk 10 feet activity did not occur: Safety/medical concerns Assist level: Touching or steadying assistance (Pt > 75%) Walk 50 feet with 2 turns activity did not occur: Safety/medical concerns Assist level: Touching or steadying assistance (Pt > 75%) Walk 150 feet activity did not occur: Safety/medical concerns Walk 10 feet on uneven surfaces activity did not occur: Safety/medical concerns  Function - Comprehension Comprehension: Auditory Comprehension assist level: Understands basic 90% of the time/cues < 10% of the time  Function - Expression Expression: Verbal Expression assist level: Expresses basic 90% of the time/requires cueing < 10% of the time.  Function - Social Interaction Social Interaction assist level: Interacts appropriately 75 - 89% of the time - Needs redirection for appropriate language or to initiate interaction.  Function - Problem Solving Problem solving assist level: Solves basic 75 - 89% of the time/requires cueing 10 - 24% of the time  Function - Memory Memory assist level: Recognizes or recalls 25 - 49% of the time/requires cueing 50 - 75% of the time Patient normally able to recall (first 3 days only): That he or she is in a hospital   Medical Problem List and Plan: 1.  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  DVT Prophylaxis/Anticoagulation: Eliquis 3. Pain Management/chronic back pain: Voltaren Gel 4 times a day, Percocet  7.5-325 mg 1 tab every 8 hours as needed, Per PT chronic R Hip and Knee pain affecting mobility at times 4. Hypertension/atrial fibrillation. Monitor cardiac rate. Allow permissive hypertension. Lopressor 12.5 mg twice a day, Cardizem CD 240 mg daily,well controlled  -ContinueEliquis   5. Neuropsych: This patient is capable of making decisions on her own behalf,, can direct power of attorney but cannot make good financial judgments independently.she will need to have help with medication management and for safety post discharge, we will recommend Neuropsych 6. Skin/Wound Care: Routine skin checks 7. Fluids/Electrolytes/Nutrition: Routine I&O's   -encourage PO   8.Hyperlipidemia. Pravachol 9. History of breast cancer 2008.Aromasin 25 mg daily 10. 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 She has a chronic left eye lateral deviation 11.  Urinary freq , repeat UA C and S cath specimen  LOS (Days) 12 A FACE TO FACE EVALUATION WAS PERFORMED  KIRSTEINS,ANDREW E 03/28/2015, 8:52 AM

## 2015-03-28 NOTE — Progress Notes (Signed)
Physical Therapy Session Note  Patient Details  Name: Rose Sullivan MRN: [id number] Date of Birth: 1933/08/18  Today's Date: 03/28/2015 PT Individual Time: 1000-1030 and 1300-1330 PT Individual Time Calculation (min): 30 min and 30 min (total 60 min)   Short Term Goals: Week 2:  PT Short Term Goal 1 (Week 2): =LTG due to estimated length of stay  Skilled Therapeutic Interventions/Progress Updates:    Tx 1: Pt received seated in w/c with daughter present; per OT and daughter report pt is very fatigued from shower. Pt is tearful, stating she is very tired. Agreeable to perform transfer to return to bed. Requires maxA for transfer with RW with difficulty keeping RUE on RW, requiring assistance to manage/turn RW, and pt hesitant to move to complete transfer requiring max cueing/facilitation to turn hips and sit on EOB. Sit >supine minA. Daughter concerned about bed height; measured hospital bed at 23" and bed at home 24" and daughter felt more comfortable after hearing that. Still concerned about pt extreme lethargy and increase in assistance level today compared to other days, especially with d/c anticipated for tomorrow. Pt remained supine in bed at completion of session, vitals assessed WNL; BP 143/65, HR 67. All needs within reach. Missed 30 minutes due to extreme lethargy; will attempt to see later in the day.   Tx 2: Pt received supine in bed with HOB elevated; more alert and family reports she has rested and is feeling much better. Supine>sit with S, HOB elevated and bedrails. Stand pivot transfer with min guard using RW to sit in w/c. Stand pivot transfer to use toilet with close S. Requires maxA for donning/doffing pants and brief. Gait x50' with RW and mn guard, slow speed and pt fatigues easily. Car transfer with min guard and RW, verbal cues for technique and hand placement. Remained seated in w/c at completion of session with handoff to OT for remainder of session.   Therapy  Documentation Precautions:  Precautions Precautions: Fall Restrictions Weight Bearing Restrictions: No General: PT Amount of Missed Time (min): 30 Minutes PT Missed Treatment Reason: Patient fatigue Pain: Pain Assessment Pain Assessment: No/denies pain Pain Score: 0-No pain   See Function Navigator for Current Functional Status.   Therapy/Group: Individual Therapy  Luberta Mutter 03/28/2015, 4:03 PM

## 2015-03-29 NOTE — Progress Notes (Signed)
Social Work  Discharge Note  The overall goal for the admission was met for:   Discharge location: Yes-HOME WITH DAUGHTER'S WHO PROVIDE 24 HR CARE  Length of Stay: Yes-13 DAYS  Discharge activity level: Yes-MIN ASSIST LEVEL  Home/community participation: Yes  Services provided included: MD, RD, PT, OT, SLP, RN, CM, TR, Pharmacy and SW  Financial Services: Medicare and Medicaid  Follow-up services arranged: Home Health: [facility]-PT,OT,RN,AIDE and Patient/Family request agency HH: ACTIVE PT OF [facility], DME: NO PREF  Comments (or additional information):DAUGHTER'S HAVE BEEN PROVIDING 24 HR CARE AND WILL CONTINUE TO DO SO. HAVE BEEN IN FOR FAMILY TRAINING.   Patient/Family verbalized understanding of follow-up arrangements: Yes  Individual responsible for coordination of the follow-up plan: ANGELA-DAUGHTER & DOROTHY-DAUGHTER  Confirmed correct DME delivered: Elease Hashimoto 03/29/2015    Elease Hashimoto

## 2015-03-29 NOTE — Progress Notes (Signed)
Subjective/Complaints: No issues overnite, aware of D/C   Review of systems denies any breathing difficulties no chest pain no abdominal pain, review limited by mental status  Objective: Vital Signs: Blood pressure 153/61, pulse 57, temperature 97.6 F (36.4 C), temperature source Oral, resp. rate 19, height 5\' 4"  (1.626 m), weight 85.73 kg (189 lb), SpO2 96 %. No results found. Results for orders placed or performed during the hospital encounter of 03/16/15 (from the past 72 hour(s))  Urinalysis, Routine w reflex microscopic (not at [facility])     Status: Abnormal   Collection Time: 03/28/15  6:50 PM  Result Value Ref Range   Color, Urine YELLOW YELLOW   APPearance CLEAR CLEAR   Specific Gravity, Urine 1.029 1.005 - 1.030   pH 5.0 5.0 - 8.0   Glucose, UA NEGATIVE NEGATIVE mg/dL   Hgb urine dipstick NEGATIVE NEGATIVE   Bilirubin Urine SMALL (A) NEGATIVE   Ketones, ur NEGATIVE NEGATIVE mg/dL   Protein, ur NEGATIVE NEGATIVE mg/dL   Nitrite NEGATIVE NEGATIVE   Leukocytes, UA NEGATIVE NEGATIVE    Comment: MICROSCOPIC NOT DONE ON URINES WITH NEGATIVE PROTEIN, BLOOD, LEUKOCYTES, NITRITE, OR GLUCOSE <1000 mg/dL.      General: No acute distress Mood and affect are appropriate Heart: irregular, irregular no rubs murmurs or extra sounds Lungs: Clear to auscultation, breathing unlabored, no rales or wheezes Abdomen: Positive bowel sounds, soft nontender to palpation, nondistended Extremities: No clubbing, cyanosis, or edema Skin: No evidence of breakdown, no evidence of rash Neurologic: Cranial nerves II through XII intact---vision limited, motor strength is 5/5 in right deltoid, bicep, tricep, grip, hip flexor, knee extensors, ankle dorsiflexor and plantar flexor 4/5 in the right deltoid, biceps, triceps, grip 4 minus/5 in the right hip flexor and extensor and ankle dorsiflexor and plantar flexor  Oriented to person , hospital , [facility] and stroke. Not day or date or month   Musculoskeletal: Full range of motion in all 4 extremities. No joint swelling   Assessment/Plan: 1. Functional deficits secondary to  left thalamic and left mesial temporal lobe infarct Stable for D/C today F/u PCP in 1-2 weeks F/u PM&R 2 weeks Dr Naaman Plummer, who has followed her prior to CVA See D/C summary See D/C instructions. FIM: Function - Bathing Bathing activity did not occur: Refused Position: Shower Body parts bathed by patient: Chest, Abdomen, Left arm, Right arm, Front perineal area, Right upper leg, Left upper leg Body parts bathed by helper: Buttocks, Back Bathing not applicable: Right lower leg, Left lower leg Assist Level: Touching or steadying assistance(Pt > 75%)  Function- Upper Body Dressing/Undressing Upper body dressing/undressing activity did not occur: Refused What is the patient wearing?: Pull over shirt/dress Pull over shirt/dress - Perfomed by patient: Thread/unthread left sleeve, Put head through opening, Pull shirt over trunk, Thread/unthread right sleeve Pull over shirt/dress - Perfomed by helper: Thread/unthread right sleeve Button up shirt - Perfomed by patient: Thread/unthread right sleeve Button up shirt - Perfomed by helper: Thread/unthread left sleeve, Pull shirt around back, Button/unbutton shirt Assist Level: Supervision or verbal cues Function - Lower Body Dressing/Undressing What is the patient wearing?: Pants, Non-skid slipper socks, Underwear Position: Other (comment) (Sitting and standing EOB) Underwear - Performed by helper: Thread/unthread right underwear leg, Thread/unthread left underwear leg, Pull underwear up/down Pants- Performed by patient: Pull pants up/down Pants- Performed by helper: Thread/unthread right pants leg, Thread/unthread left pants leg Non-skid slipper socks- Performed by helper: Don/doff left sock, Don/doff right sock TED Hose - Performed by helper: Don/doff right  TED hose, Don/doff left TED hose Assist for footwear:  Dependant Assist for lower body dressing: Touching or steadying assistance (Pt > 75%)  Function - Toileting Toileting activity did not occur: Refused Toileting steps completed by patient: Adjust clothing prior to toileting, Adjust clothing after toileting Toileting steps completed by helper: Performs perineal hygiene Toileting Assistive Devices: Grab bar or rail Assist level: More than reasonable time  Function - Air cabin crew transfer activity did not occur: Refused Toilet transfer assistive device: Grab bar Assist level to toilet: Touching or steadying assistance (Pt > 75%) Assist level from toilet: Touching or steadying assistance (Pt > 75%)  Function - Chair/bed transfer Chair/bed transfer activity did not occur: N/A (arrived to unit in bed) Chair/bed transfer method: Stand pivot Chair/bed transfer assist level: Supervision or verbal cues Chair/bed transfer assistive device: Walker Chair/bed transfer details: Verbal cues for sequencing, Verbal cues for technique, Tactile cues for weight shifting, Tactile cues for posture  Function - Locomotion: Wheelchair Will patient use wheelchair at discharge?: Yes Type: Manual Max wheelchair distance: 75 Assist Level: Supervision or verbal cues Assist Level: Supervision or verbal cues Wheel 150 feet activity did not occur: Safety/medical concerns Turns around,maneuvers to table,bed, and toilet,negotiates 3% grade,maneuvers on rugs and over doorsills: No Function - Locomotion: Ambulation Assistive device: Walker-rolling Max distance: 50 Assist level: Touching or steadying assistance (Pt > 75%) Walk 10 feet activity did not occur: Safety/medical concerns Assist level: Touching or steadying assistance (Pt > 75%) Walk 50 feet with 2 turns activity did not occur: Safety/medical concerns Assist level: Touching or steadying assistance (Pt > 75%) Walk 150 feet activity did not occur: Safety/medical concerns Walk 10 feet on uneven  surfaces activity did not occur: Safety/medical concerns  Function - Comprehension Comprehension: Auditory Comprehension assist level: Understands basic 75 - 89% of the time/ requires cueing 10 - 24% of the time  Function - Expression Expression: Verbal Expression assist level: Expresses basic 75 - 89% of the time/requires cueing 10 - 24% of the time. Needs helper to occlude trach/needs to repeat words.  Function - Social Interaction Social Interaction assist level: Interacts appropriately 75 - 89% of the time - Needs redirection for appropriate language or to initiate interaction.  Function - Problem Solving Problem solving assist level: Solves basic 75 - 89% of the time/requires cueing 10 - 24% of the time  Function - Memory Memory assist level: Recognizes or recalls 75 - 89% of the time/requires cueing 10 - 24% of the time Patient normally able to recall (first 3 days only): That he or Rose Sullivan is in a hospital   Medical Problem List and Plan: 1.  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  DVT Prophylaxis/Anticoagulation: Eliquis 3. Pain Management/chronic back pain: Voltaren Gel 4 times a day, Percocet 7.5-325 mg 1 tab every 8 hours as needed, Per PT chronic R Hip and Knee pain affecting mobility at times 4. Hypertension/atrial fibrillation. Monitor cardiac rate. Allow permissive hypertension. Lopressor 12.5 mg twice a day, Cardizem CD 240 mg daily,well controlled  -ContinueEliquis   5. Neuropsych: This patient is capable of making decisions on her own behalf,, can direct power of attorney but cannot make good financial judgments independently.Rose Sullivan will need to have help with medication management and for safety post discharge, we will recommend Neuropsych 6. Skin/Wound Care: Routine skin checks 7. Fluids/Electrolytes/Nutrition: Routine I&O's   -encourage PO   8.Hyperlipidemia. Pravachol 9. History of breast cancer  2008.Aromasin 25 mg daily 10.  Legally blind. Rose Sullivan can see figures and objects.according to daughter this has been from childbirth. Rose Sullivan has a chronic left eye lateral deviation 11.  Urinary freq , repeat UA C and S cath specimen  LOS (Days) 13 A FACE TO FACE EVALUATION WAS PERFORMED  Rose Sullivan 03/29/2015, 8:41 AM

## 2015-03-29 NOTE — Progress Notes (Signed)
Pt's daughter got d/c papers and instructions,follow up appointments and prescriptions.Pt. Is ready to go home with family.

## 2015-03-30 ENCOUNTER — Telehealth: Payer: Self-pay

## 2015-03-30 ENCOUNTER — Telehealth: Payer: Self-pay | Admitting: *Deleted

## 2015-03-30 LAB — URINE CULTURE: Culture: NO GROWTH

## 2015-03-30 MED ORDER — ONETOUCH ULTRASOFT LANCETS MISC: 1.0000 | Freq: Two times a day (BID) | Status: DC

## 2015-03-30 MED ORDER — GLUCOSE BLOOD VI STRP: 1.0000 | ORAL_STRIP | Freq: Two times a day (BID) | Status: DC

## 2015-03-30 NOTE — Telephone Encounter (Signed)
PA initiated via CoverMyMeds [id number]

## 2015-03-30 NOTE — Telephone Encounter (Signed)
Tried calling pt to get her set-up for TCM appt no answer LMOM RTC...Rose Sullivan

## 2015-03-30 NOTE — Telephone Encounter (Signed)
Daughter came in completed TCM call below.../lmb  Transition Care Management Follow-up Telephone Call   Date discharged? 03/29/15   How have you been since you were released from the hospital? Daughter states mpm is all right still not her self. MD from hospital sent prescriptions to [facility] and they are telling her she can't get them.   Do you understand why you were in the hospital? YES   Do you understand the discharge instructions? YES   Where were you discharged to? Home   Items Reviewed:  Medications reviewed: YES, After listening to daughter and reading d/c summary contacted [facility] spoke with rep she is needing a PA on her Eliquis & Voltaren Gel. She states mom doesn't have any for today. Inform daughter will give samples until we are able to get PA on both med. Dr. Tamala Julian had giveb her samples of Pennsaid & since we do not have voltaren Gel I gave her sample of pennsaid [facility] has [facility] over PA's for both med will giveto Dahlia to complete!  Allergies reviewed: YES   Dietary changes reviewed: YES  Referrals reviewed: No referral needed   Functional Questionnaire:   Activities of Daily Living (ADLs):   She states she are independent in the following: feeding and toileting States she requiressistance with the following: ambulation, bathing and hygiene, grooming and dressing   Any transportation issues/concerns?: {NO   Any patient concerns? YES, daughter was concern about the medications but gave her samples & let her know that we will work on PA   Confirmed importance and date/time of follow-up visits scheduled YES, appt 04/11/15  Provider Appointment booked with Dr. Jenny Reichmann  Confirmed with patient if condition begins to worsen call PCP or go to the ER.  Patient was given the office number and encouraged to call back with question or concerns.  : YES

## 2015-03-31 ENCOUNTER — Telehealth: Payer: Self-pay | Admitting: Physical Medicine & Rehabilitation

## 2015-03-31 ENCOUNTER — Telehealth: Payer: Self-pay | Admitting: *Deleted

## 2015-03-31 NOTE — Telephone Encounter (Signed)
Called and spoke with Angie- primary caregiver  1. Are you/is patient experiencing any problems since coming home? Are there any questions regarding any aspect of care? Not that Angie can think of 2. Are there any questions regarding medications administration/dosing? Are meds being taken as prescribed? Already had an RX for Oxycodone so they are holding the one from hospital and they needed a prior auth for Eliquis. 3. Have there been any falls? No  4. Has Home Health been to the house and/or have they contacted you? If not, have you tried to contact them? Can we help you contact them? HHC already set up from prior to admission 5. Are bowels and bladder emptying properly? Are there any unexpected incontinence issues? If applicable, is patient following bowel/bladder programs? No 6. Any fevers, problems with breathing, unexpected pain? No 7. Are there any skin problems or new areas of breakdown?No  8. Has the patient/family member arranged specialty MD follow up (ie cardiology/neurology/renal/surgical/etc)?  Can we help arrange? Everything seems to be scheduled 9. Does the patient need any other services or support that we can help arrange? PT currently at residence 11. Are caregivers following through as expected in assisting the patient? Right arm very weak and Angie has to assist with all ADLs.   Patient was discharged under Kirsteins but was told by Kirsteins she could continue with Naaman Plummer.  Appointment scheduled for March 7 at 12:30pm.

## 2015-03-31 NOTE — Telephone Encounter (Addendum)
Prior auth for diclofenac gel 1% (votaren) was approved through 01/29/16.  Prior auth for Eliquis 5 mg tablets was approved through 09/30/15 as well.

## 2015-04-04 ENCOUNTER — Telehealth: Payer: Self-pay | Admitting: Internal Medicine

## 2015-04-04 NOTE — Telephone Encounter (Signed)
Called Rose Sullivan at number listed.   Gave Rose Sullivan verbals per PCP below.

## 2015-04-04 NOTE — Telephone Encounter (Signed)
See below

## 2015-04-04 NOTE — Telephone Encounter (Signed)
[facility] for verbal - MOM 30 cc po daily prn

## 2015-04-04 NOTE — Telephone Encounter (Signed)
Patient has not had a bowl movement in six days.  Would like an order for milk of magnesia.

## 2015-04-05 ENCOUNTER — Encounter: Payer: Medicare Other | Attending: Physical Medicine & Rehabilitation | Admitting: Physical Medicine & Rehabilitation

## 2015-04-05 ENCOUNTER — Encounter: Payer: Self-pay | Admitting: Physical Medicine & Rehabilitation

## 2015-04-05 ENCOUNTER — Telehealth: Payer: Self-pay | Admitting: *Deleted

## 2015-04-05 VITALS — BP 127/61 | HR 50

## 2015-04-05 DIAGNOSIS — M47816 Spondylosis without myelopathy or radiculopathy, lumbar region: Secondary | ICD-10-CM

## 2015-04-05 DIAGNOSIS — Z5181 Encounter for therapeutic drug level monitoring: Secondary | ICD-10-CM | POA: Diagnosis present

## 2015-04-05 DIAGNOSIS — M1611 Unilateral primary osteoarthritis, right hip: Secondary | ICD-10-CM

## 2015-04-05 DIAGNOSIS — Z79899 Other long term (current) drug therapy: Secondary | ICD-10-CM | POA: Diagnosis present

## 2015-04-05 DIAGNOSIS — M1712 Unilateral primary osteoarthritis, left knee: Secondary | ICD-10-CM

## 2015-04-05 DIAGNOSIS — I6322 Cerebral infarction due to unspecified occlusion or stenosis of basilar arteries: Secondary | ICD-10-CM

## 2015-04-05 DIAGNOSIS — I10 Essential (primary) hypertension: Secondary | ICD-10-CM | POA: Diagnosis not present

## 2015-04-05 DIAGNOSIS — I69359 Hemiplegia and hemiparesis following cerebral infarction affecting unspecified side: Secondary | ICD-10-CM

## 2015-04-05 DIAGNOSIS — M1711 Unilateral primary osteoarthritis, right knee: Secondary | ICD-10-CM

## 2015-04-05 DIAGNOSIS — E639 Nutritional deficiency, unspecified: Secondary | ICD-10-CM

## 2015-04-05 DIAGNOSIS — I6381 Other cerebral infarction due to occlusion or stenosis of small artery: Secondary | ICD-10-CM

## 2015-04-05 DIAGNOSIS — I63212 Cerebral infarction due to unspecified occlusion or stenosis of left vertebral arteries: Secondary | ICD-10-CM | POA: Diagnosis not present

## 2015-04-05 DIAGNOSIS — G894 Chronic pain syndrome: Secondary | ICD-10-CM | POA: Insufficient documentation

## 2015-04-05 MED ORDER — ONETOUCH DELICA LANCETS 33G MISC: Status: DC

## 2015-04-05 MED ORDER — DICLOFENAC SODIUM 1.5 % TD SOLN: 40.0000 [drp] | Freq: Three times a day (TID) | TRANSDERMAL | Status: DC

## 2015-04-05 MED ORDER — OXYCODONE-ACETAMINOPHEN 7.5-325 MG PO TABS: 1.0000 | ORAL_TABLET | Freq: Three times a day (TID) | ORAL | Status: DC | PRN

## 2015-04-05 MED ORDER — MEGESTROL ACETATE 400 MG/10ML PO SUSP: 400.0000 mg | Freq: Every day | ORAL | Status: DC

## 2015-04-05 NOTE — Telephone Encounter (Signed)
Recieve call  From [facility] stating pt insurance require her to use One touch delica lancets. Needing rx resent w/ frequency, dx code. Faxed script on lancets to [facility].../lmb

## 2015-04-05 NOTE — Patient Instructions (Signed)
TAKE MEGACE DAILY UNTIL APPETITE STARTS TO PICK UP  CONTINUE TO HOLD COZAAR   HH NURSE TO CHECK NUTRITION, BP, AND BOWEL MOVEMENTS

## 2015-04-05 NOTE — Progress Notes (Signed)
TRANSITIONAL CARE NOTE Subjective:    Patient ID: Rose Sullivan, female    DOB: July 31, 1933, 82 y.o.   MRN: [id number]  HPI   Rose Sullivan is here in follow up her left thalamic and temporal infarcts in addition to her chronic pain. She was discharged from inpatient rehab last week where she was under the care of Dr. Letta Pate.  Per daughter, things have been a little "up and down.". She hasn't had an appetite and hasn't moved her bowels since she's been home. Family works hard to feed her and provide nutrition but she just isn't hungry. Her taste and smell seem normal.   Her cognition/arousal can wax and wane although they both seem to be improving. She is up more in her chair during the day. Daughter notes that she is more amenable to participating in therapeutic activities, more "permissive" as a whole. She also notes that Rose Sullivan is not as engaged in some of things she used to like doing such as watching Lujean Rave.  She is walking with the walker for short dx at home. The wheelchair is there for backup. PT and OT are coming to the house.   Her pain levels are near baseline prior to her CVA. Her right hip and knee in particular remain inhibiting.  Pain Inventory Average Pain 5 Pain Right Now 5 My pain is intermittent  In the last 24 hours, has pain interfered with the following? General activity 7 Relation with others 5 Enjoyment of life 5 What TIME of day is your pain at its worst? morning evening and night Sleep (in general) NA  Pain is worse with: walking and bed Pain improves with: medication Relief from Meds: 5  Mobility walk with assistance use a walker ability to climb steps?  no do you drive?  no use a wheelchair needs help with transfers  Function retired I need assistance with the following:  feeding, dressing, bathing, toileting, meal prep, household duties and shopping  Neuro/Psych weakness numbness confusion  Prior Studies Any changes since  last visit?  no  Physicians involved in your care Any changes since last visit?  no   Family History  Problem Relation Age of Onset  . Hypertension Brother   . Lung cancer Brother   . Colon cancer Father   . COPD Brother   . Stroke Mother    Social History   Social History  . Marital Status: Widowed    Spouse Name: N/A  . Number of Children: 1  . Years of Education: N/A   Occupational History  .     Social History Main Topics  . Smoking status: Former Research scientist (life sciences)  . Smokeless tobacco: Never Used     Comment: quit 40 years ago  . Alcohol Use: No  . Drug Use: No  . Sexual Activity: Not Asked   Other Topics Concern  . None   Social History Narrative   Lives with daughter   Past Surgical History  Procedure Laterality Date  . Dilation and curettage of uterus    . S/p mastectomy    . Appendectomy     Past Medical History  Diagnosis Date  . GLUCOSE INTOLERANCE 02/11/2007  . HYPERLIPIDEMIA 02/11/2007  . BLINDNESS, [place], [place] DEFINITION 11/25/2006  . Other specified forms of hearing loss 09/05/2009  . HYPERTENSION 02/11/2007  . Atrial fibrillation ([place]) 03/07/2009  . GERD 02/11/2007  . LOW BACK PAIN, CHRONIC 11/25/2006  . BACK PAIN 12/10/2007  . Pain  in Soft Tissues of Limb 02/11/2007  . OSTEOPENIA 02/11/2007  . FATIGUE 12/10/2007  . GAIT DISTURBANCE 12/10/2007  . DYSPNEA 03/18/2009  . Cough 03/07/2009  . CHEST PAIN 12/10/2007  . Abdominal pain, left lower quadrant 04/01/2007  . BREAST CANCER, HX OF 11/25/2006  . Nocturia 05/18/2010  . Hypersomnia 05/18/2010  . Impaired glucose tolerance 05/18/2010  . Allergic rhinitis, cause unspecified 07/23/2012  . Anxiety state, unspecified 02/04/2013  . Cancer (HCC)    BP 127/61 mmHg  Pulse 50  SpO2 98%  Opioid Risk Score:   Fall Risk Score:  `1  Depression screen PHQ 2/9  Depression screen [facility] 2/9 04/05/2015 02/25/2015 10/20/2014 08/18/2014 04/13/2014 08/05/2013  Decreased Interest 3 0 1 0 2 0  Down, Depressed, Hopeless 0 0 0 0 0 1  PHQ  - 2 Score 3 0 1 0 2 1  Altered sleeping 2 - - - 2 -  Tired, decreased energy 3 - - - 3 -  Change in appetite 3 - - - 0 -  Feeling bad or failure about yourself  1 - - - 0 -  Trouble concentrating 2 - - - 0 -  Moving slowly or fidgety/restless 1 - - - 0 -  Suicidal thoughts 0 - - - 0 -  PHQ-9 Score 15 - - - 7 -  Difficult doing work/chores Extremely dIfficult - - - - -     Review of Systems  Constitutional: Positive for appetite change.  Gastrointestinal: Positive for constipation.  All other systems reviewed and are negative.      Objective:   Physical Exam  General: No acute distress Mood and affect are appropriate Heart: irregular, irregular no rubs murmurs or extra sounds Lungs: Clear to auscultation, breathing unlabored, no rales or wheezes Abdomen: Positive bowel sounds, soft nontender to palpation, nondistended Extremities: No clubbing, cyanosis, or edema Skin: No evidence of breakdown, no evidence of rash Neurologic: Cranial nerves II through XII intact---vision limited, motor strength is 5/5 in right deltoid, bicep, tricep, grip, hip flexor, knee extensors, ankle dorsiflexor and plantar flexor 4/5 in the right deltoid, biceps, triceps, grip 4 minus/5 in the right hip flexor and extensor and ankle dorsiflexor and plantar flexor  Oriented to person , hospital , cone and stroke. Not day or date or month  Musculoskeletal: Full range of motion in all 4 extremities. No joint swelling       Assessment & Plan:  1. Altered mental status/right hemiparesis secondary to left thalamic and left mesial temporal lobe infarct,  -continue home health PT/OT  -patient has made continued progress since leaving rehab. Expressed to her daughter that a lot of her behavioral changes should resolve as she progresses 2.FEN: poor intake  -megace trial  -family will continue work on dietary options  -will order [facility] also 3. Chronic right hip and lumbar spine pain:   Percocet 7.5-325 mg  1 tab every 6 hours as needed with a second rx for next month  -will try pennsaid for knee pain as daughter felt this helped her pain much more than the voltaren gel. 4. HTN: well controlled at present with lopressor and cardizem   -continue to hold cozaar 5. History of breast cancer 2008.Aromasin 25 mg daily 6. 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 She has a chronic left eye lateral deviation 7. Urinary freq: near baseline  Thirty minutes of face to face patient care time were spent during this visit. All  questions were encouraged and answered. Follow up with me in 2 months.

## 2015-04-07 NOTE — Telephone Encounter (Signed)
PA Approved per pharmacy. Pharmacy will inform pt

## 2015-04-11 ENCOUNTER — Emergency Department (HOSPITAL_COMMUNITY): Payer: Medicare Other

## 2015-04-11 ENCOUNTER — Encounter (HOSPITAL_COMMUNITY): Payer: Self-pay | Admitting: Vascular Surgery

## 2015-04-11 ENCOUNTER — Inpatient Hospital Stay: Payer: Medicare Other | Admitting: Internal Medicine

## 2015-04-11 ENCOUNTER — Inpatient Hospital Stay (HOSPITAL_COMMUNITY)
Admission: EM | Admit: 2015-04-11 | Discharge: 2015-04-15 | DRG: 064 | Disposition: A | Discharge status: Home Health | Payer: Medicare Other | Attending: Internal Medicine | Admitting: Internal Medicine

## 2015-04-11 ENCOUNTER — Telehealth: Payer: Self-pay | Admitting: *Deleted

## 2015-04-11 DIAGNOSIS — Z87891 Personal history of nicotine dependence: Secondary | ICD-10-CM

## 2015-04-11 DIAGNOSIS — G4710 Hypersomnia, unspecified: Secondary | ICD-10-CM | POA: Diagnosis present

## 2015-04-11 DIAGNOSIS — E119 Type 2 diabetes mellitus without complications: Secondary | ICD-10-CM | POA: Diagnosis present

## 2015-04-11 DIAGNOSIS — I639 Cerebral infarction, unspecified: Secondary | ICD-10-CM

## 2015-04-11 DIAGNOSIS — G9340 Encephalopathy, unspecified: Secondary | ICD-10-CM | POA: Diagnosis present

## 2015-04-11 DIAGNOSIS — H548 Legal blindness, as defined in USA: Secondary | ICD-10-CM | POA: Diagnosis present

## 2015-04-11 DIAGNOSIS — N390 Urinary tract infection, site not specified: Secondary | ICD-10-CM

## 2015-04-11 DIAGNOSIS — E785 Hyperlipidemia, unspecified: Secondary | ICD-10-CM | POA: Diagnosis present

## 2015-04-11 DIAGNOSIS — Z79818 Long term (current) use of other agents affecting estrogen receptors and estrogen levels: Secondary | ICD-10-CM

## 2015-04-11 DIAGNOSIS — M8580 Other specified disorders of bone density and structure, unspecified site: Secondary | ICD-10-CM | POA: Diagnosis present

## 2015-04-11 DIAGNOSIS — I69351 Hemiplegia and hemiparesis following cerebral infarction affecting right dominant side: Secondary | ICD-10-CM

## 2015-04-11 DIAGNOSIS — Z9049 Acquired absence of other specified parts of digestive tract: Secondary | ICD-10-CM

## 2015-04-11 DIAGNOSIS — K219 Gastro-esophageal reflux disease without esophagitis: Secondary | ICD-10-CM | POA: Diagnosis present

## 2015-04-11 DIAGNOSIS — Z853 Personal history of malignant neoplasm of breast: Secondary | ICD-10-CM

## 2015-04-11 DIAGNOSIS — Z8249 Family history of ischemic heart disease and other diseases of the circulatory system: Secondary | ICD-10-CM

## 2015-04-11 DIAGNOSIS — I4891 Unspecified atrial fibrillation: Secondary | ICD-10-CM | POA: Diagnosis present

## 2015-04-11 DIAGNOSIS — Z79899 Other long term (current) drug therapy: Secondary | ICD-10-CM

## 2015-04-11 DIAGNOSIS — E669 Obesity, unspecified: Secondary | ICD-10-CM | POA: Diagnosis present

## 2015-04-11 DIAGNOSIS — N179 Acute kidney failure, unspecified: Secondary | ICD-10-CM

## 2015-04-11 DIAGNOSIS — Z8673 Personal history of transient ischemic attack (TIA), and cerebral infarction without residual deficits: Secondary | ICD-10-CM | POA: Insufficient documentation

## 2015-04-11 DIAGNOSIS — Z9010 Acquired absence of unspecified breast and nipple: Secondary | ICD-10-CM

## 2015-04-11 DIAGNOSIS — Z7901 Long term (current) use of anticoagulants: Secondary | ICD-10-CM

## 2015-04-11 DIAGNOSIS — Z825 Family history of asthma and other chronic lower respiratory diseases: Secondary | ICD-10-CM

## 2015-04-11 DIAGNOSIS — I63532 Cerebral infarction due to unspecified occlusion or stenosis of left posterior cerebral artery: Principal | ICD-10-CM | POA: Diagnosis present

## 2015-04-11 DIAGNOSIS — R531 Weakness: Secondary | ICD-10-CM

## 2015-04-11 DIAGNOSIS — R4182 Altered mental status, unspecified: Secondary | ICD-10-CM

## 2015-04-11 DIAGNOSIS — E860 Dehydration: Secondary | ICD-10-CM | POA: Diagnosis present

## 2015-04-11 DIAGNOSIS — I959 Hypotension, unspecified: Secondary | ICD-10-CM | POA: Diagnosis present

## 2015-04-11 DIAGNOSIS — I10 Essential (primary) hypertension: Secondary | ICD-10-CM | POA: Diagnosis present

## 2015-04-11 DIAGNOSIS — H9190 Unspecified hearing loss, unspecified ear: Secondary | ICD-10-CM | POA: Diagnosis present

## 2015-04-11 DIAGNOSIS — Z800 Family history of malignant neoplasm of digestive organs: Secondary | ICD-10-CM

## 2015-04-11 DIAGNOSIS — F411 Generalized anxiety disorder: Secondary | ICD-10-CM | POA: Diagnosis present

## 2015-04-11 DIAGNOSIS — R413 Other amnesia: Secondary | ICD-10-CM | POA: Diagnosis present

## 2015-04-11 DIAGNOSIS — Z6836 Body mass index (BMI) 36.0-36.9, adult: Secondary | ICD-10-CM

## 2015-04-11 DIAGNOSIS — Z823 Family history of stroke: Secondary | ICD-10-CM

## 2015-04-11 LAB — URINALYSIS, ROUTINE W REFLEX MICROSCOPIC
GLUCOSE, UA: NEGATIVE mg/dL
Hgb urine dipstick: NEGATIVE
KETONES UR: 15 mg/dL — AB
NITRITE: POSITIVE — AB
PROTEIN: 100 mg/dL — AB
Specific Gravity, Urine: 1.037 — ABNORMAL HIGH (ref 1.005–1.030)
pH: 5 (ref 5.0–8.0)

## 2015-04-11 LAB — I-STAT CHEM 8, ED
BUN: 25 mg/dL — ABNORMAL HIGH (ref 6–20)
CREATININE: 1.4 mg/dL — AB (ref 0.44–1.00)
Calcium, Ion: 1.16 mmol/L (ref 1.13–1.30)
Chloride: 109 mmol/L (ref 101–111)
Glucose, Bld: 137 mg/dL — ABNORMAL HIGH (ref 65–99)
HEMATOCRIT: 52 % — AB (ref 36.0–46.0)
HEMOGLOBIN: 17.7 g/dL — AB (ref 12.0–15.0)
Potassium: 4.1 mmol/L (ref 3.5–5.1)
SODIUM: 142 mmol/L (ref 135–145)
TCO2: 17 mmol/L (ref 0–100)

## 2015-04-11 LAB — DIFFERENTIAL
Basophils Absolute: 0 10*3/uL (ref 0.0–0.1)
Basophils Relative: 0 %
EOS ABS: 0.3 10*3/uL (ref 0.0–0.7)
EOS PCT: 3 %
LYMPHS ABS: 2.8 10*3/uL (ref 0.7–4.0)
Lymphocytes Relative: 26 %
MONOS PCT: 8 %
Monocytes Absolute: 0.9 10*3/uL (ref 0.1–1.0)
Neutro Abs: 7.1 10*3/uL (ref 1.7–7.7)
Neutrophils Relative %: 63 %

## 2015-04-11 LAB — COMPREHENSIVE METABOLIC PANEL
ALT: 24 U/L (ref 14–54)
ANION GAP: 13 (ref 5–15)
AST: 44 U/L — ABNORMAL HIGH (ref 15–41)
Albumin: 3.7 g/dL (ref 3.5–5.0)
Alkaline Phosphatase: 72 U/L (ref 38–126)
BILIRUBIN TOTAL: 1.2 mg/dL (ref 0.3–1.2)
BUN: 22 mg/dL — ABNORMAL HIGH (ref 6–20)
CO2: 17 mmol/L — ABNORMAL LOW (ref 22–32)
Calcium: 9.5 mg/dL (ref 8.9–10.3)
Chloride: 111 mmol/L (ref 101–111)
Creatinine, Ser: 1.6 mg/dL — ABNORMAL HIGH (ref 0.44–1.00)
GFR calc Af Amer: 34 mL/min — ABNORMAL LOW (ref >60)
GFR, EST NON AFRICAN AMERICAN: 30 mL/min — AB (ref >60)
Glucose, Bld: 140 mg/dL — ABNORMAL HIGH (ref 65–99)
POTASSIUM: 4.4 mmol/L (ref 3.5–5.1)
Sodium: 141 mmol/L (ref 135–145)
TOTAL PROTEIN: 7.7 g/dL (ref 6.5–8.1)

## 2015-04-11 LAB — URINE MICROSCOPIC-ADD ON

## 2015-04-11 LAB — I-STAT TROPONIN, ED: TROPONIN I, POC: 0.02 ng/mL (ref 0.00–0.08)

## 2015-04-11 LAB — CBC
HEMATOCRIT: 47.3 % — AB (ref 36.0–46.0)
HEMOGLOBIN: 16 g/dL — AB (ref 12.0–15.0)
MCH: 31.3 pg (ref 26.0–34.0)
MCHC: 33.8 g/dL (ref 30.0–36.0)
MCV: 92.4 fL (ref 78.0–100.0)
Platelets: 251 10*3/uL (ref 150–400)
RBC: 5.12 MIL/uL — ABNORMAL HIGH (ref 3.87–5.11)
RDW: 12.8 % (ref 11.5–15.5)
WBC: 11.1 10*3/uL — ABNORMAL HIGH (ref 4.0–10.5)

## 2015-04-11 LAB — APTT: aPTT: 32 seconds (ref 24–37)

## 2015-04-11 LAB — PROTIME-INR
INR: 1.76 — AB (ref 0.00–1.49)
Prothrombin Time: 20.5 seconds — ABNORMAL HIGH (ref 11.6–15.2)

## 2015-04-11 MED ORDER — DEXTROSE 5 % IV SOLN
1.0000 g | Freq: Once | INTRAVENOUS | Status: AC
  Administered 2015-04-11: 1 g via INTRAVENOUS
  Filled 2015-04-11: qty 10

## 2015-04-11 NOTE — ED Provider Notes (Addendum)
CSN: [id number]     Arrival date & time 04/11/15  1627 History   First MD Initiated Contact with Patient 04/11/15 2003     Chief Complaint  Patient presents with  . Weakness     (Consider location/radiation/quality/duration/timing/severity/associated sxs/prior Treatment) Patient is a 82 y.o. female presenting with weakness. The history is provided by the patient, the EMS personnel and a relative.  Weakness Pertinent negatives include no headaches.  Patient w hx cva 1 month ago, w residual right weakness, presents w increased generalized weakness in past day, starting yesterday, esp on right side.  Pt also appears generally confused. Pt very poor historian, unable to answer most questions - level 5 caveat. Pt denies pain. No headache. Denies trauma or fall. No fever.      Past Medical History  Diagnosis Date  . GLUCOSE INTOLERANCE 02/11/2007  . HYPERLIPIDEMIA 02/11/2007  . BLINDNESS, [place], [place] DEFINITION 11/25/2006  . Other specified forms of hearing loss 09/05/2009  . HYPERTENSION 02/11/2007  . Atrial fibrillation ([place]) 03/07/2009  . GERD 02/11/2007  . LOW BACK PAIN, CHRONIC 11/25/2006  . BACK PAIN 12/10/2007  . Pain in Soft Tissues of Limb 02/11/2007  . OSTEOPENIA 02/11/2007  . FATIGUE 12/10/2007  . GAIT DISTURBANCE 12/10/2007  . DYSPNEA 03/18/2009  . Cough 03/07/2009  . CHEST PAIN 12/10/2007  . Abdominal pain, left lower quadrant 04/01/2007  . BREAST CANCER, HX OF 11/25/2006  . Nocturia 05/18/2010  . Hypersomnia 05/18/2010  . Impaired glucose tolerance 05/18/2010  . Allergic rhinitis, cause unspecified 07/23/2012  . Anxiety state, unspecified 02/04/2013  . Cancer [facility])    Past Surgical History  Procedure Laterality Date  . Dilation and curettage of uterus    . S/p mastectomy    . Appendectomy     Family History  Problem Relation Age of Onset  . Hypertension Brother   . Lung cancer Brother   . Colon cancer Father   . COPD Brother   . Stroke Mother    Social History  Substance  Use Topics  . Smoking status: Former Research scientist (life sciences)  . Smokeless tobacco: Never Used     Comment: quit 40 years ago  . Alcohol Use: No   OB History    No data available       Review of Systems  Unable to perform ROS: Mental status change  Constitutional: Negative for fever.  Gastrointestinal: Negative for vomiting.  Neurological: Positive for weakness. Negative for headaches.  level 5 caveat, pt does not answer majority of questions in ros.      Allergies  Trazodone and nefazodone  Home Medications   Prior to Admission medications   Medication Sig Start Date End Date Taking? Authorizing Provider  apixaban (ELIQUIS) 5 MG TABS tablet Take 1 tablet (5 mg total) by mouth 2 (two) times daily. 03/28/15   Lavon Paganini Angiulli, PA-C  Diclofenac Sodium 1.5 % SOLN Place 2 mLs onto the skin 3 (three) times daily. 04/05/15   Meredith Staggers, MD  diltiazem (CARDIZEM CD) 240 MG 24 hr capsule Take 1 capsule (240 mg total) by mouth daily. 03/28/15   Lavon Paganini Angiulli, PA-C  exemestane (AROMASIN) 25 MG tablet Take 1 tablet (25 mg total) by mouth daily. 03/28/15   Lavon Paganini Angiulli, PA-C  glucose blood (ONE TOUCH TEST STRIPS) test strip 1 each by Other route 2 (two) times daily. Use to check blood sugars twice a day Dx E11.9 03/30/15   Biagio Borg, MD  megestrol (MEGACE) 400 MG/10ML suspension  Take 10 mLs (400 mg total) by mouth daily. 04/05/15   Meredith Staggers, MD  metoprolol tartrate (LOPRESSOR) 25 MG tablet Take 0.5 tablets (12.5 mg total) by mouth 2 (two) times daily. 03/28/15   Lavon Paganini Angiulli, PA-C  ONETOUCH DELICA LANCETS 99991111 MISC Use ot helpm check blood sugars twice a day Dx E11.9 04/05/15   Biagio Borg, MD  oxyCODONE-acetaminophen (PERCOCET) 7.5-325 MG tablet Take 1 tablet by mouth every 8 (eight) hours as needed. 04/05/15   Meredith Staggers, MD  pravastatin (PRAVACHOL) 40 MG tablet Take 1 tablet (40 mg total) by mouth daily at 6 PM. 03/28/15   Lavon Paganini Angiulli, PA-C   BP 134/75 mmHg  Pulse 84   Temp(Src)   Resp 16  SpO2 100% Physical Exam  Constitutional: She is oriented to person, place, and time. She appears well-developed and well-nourished. No distress.  HENT:  Head: Atraumatic.  Mouth/Throat: Oropharynx is clear and moist.  Eyes: Conjunctivae are normal. Pupils are equal, round, and reactive to light. No scleral icterus.  Neck: Normal range of motion. Neck supple. No tracheal deviation present.  No stiffness or rigidity. No bruits.   Cardiovascular: Normal rate, regular rhythm, normal heart sounds and intact distal pulses.  Exam reveals no gallop and no friction rub.   No murmur heard. Pulmonary/Chest: Effort normal and breath sounds normal. No respiratory distress.  Abdominal: Soft. Normal appearance and bowel sounds are normal. She exhibits no distension and no mass. There is no tenderness. There is no rebound and no guarding.  Genitourinary:  No cva tenderness  Musculoskeletal: She exhibits no edema or tenderness.  Neurological: She is alert and oriented to person, place, and time.  Right weakness, stre 4/5. Left 5/5. Speech clear/fluent. Confused.   Skin: Skin is warm and dry. No rash noted. She is not diaphoretic.  Psychiatric: She has a normal mood and affect.  Nursing note and vitals reviewed.   ED Course  Procedures (including critical care time) Labs Review   Results for orders placed or performed during the hospital encounter of 04/11/15  Protime-INR  Result Value Ref Range   Prothrombin Time 20.5 (H) 11.6 - 15.2 seconds   INR 1.76 (H) 0.00 - 1.49  APTT  Result Value Ref Range   aPTT 32 24 - 37 seconds  CBC  Result Value Ref Range   WBC 11.1 (H) 4.0 - 10.5 K/uL   RBC 5.12 (H) 3.87 - 5.11 MIL/uL   Hemoglobin 16.0 (H) 12.0 - 15.0 g/dL   HCT 47.3 (H) 36.0 - 46.0 %   MCV 92.4 78.0 - 100.0 fL   MCH 31.3 26.0 - 34.0 pg   MCHC 33.8 30.0 - 36.0 g/dL   RDW 12.8 11.5 - 15.5 %   Platelets 251 150 - 400 K/uL  Differential  Result Value Ref Range    Neutrophils Relative % 63 %   Neutro Abs 7.1 1.7 - 7.7 K/uL   Lymphocytes Relative 26 %   Lymphs Abs 2.8 0.7 - 4.0 K/uL   Monocytes Relative 8 %   Monocytes Absolute 0.9 0.1 - 1.0 K/uL   Eosinophils Relative 3 %   Eosinophils Absolute 0.3 0.0 - 0.7 K/uL   Basophils Relative 0 %   Basophils Absolute 0.0 0.0 - 0.1 K/uL  Comprehensive metabolic panel  Result Value Ref Range   Sodium 141 135 - 145 mmol/L   Potassium 4.4 3.5 - 5.1 mmol/L   Chloride 111 101 - 111 mmol/L   CO2  17 (L) 22 - 32 mmol/L   Glucose, Bld 140 (H) 65 - 99 mg/dL   BUN 22 (H) 6 - 20 mg/dL   Creatinine, Ser 1.60 (H) 0.44 - 1.00 mg/dL   Calcium 9.5 8.9 - 10.3 mg/dL   Total Protein 7.7 6.5 - 8.1 g/dL   Albumin 3.7 3.5 - 5.0 g/dL   AST 44 (H) 15 - 41 U/L   ALT 24 14 - 54 U/L   Alkaline Phosphatase 72 38 - 126 U/L   Total Bilirubin 1.2 0.3 - 1.2 mg/dL   GFR calc non Af Amer 30 (L) >60 mL/min   GFR calc Af Amer 34 (L) >60 mL/min   Anion gap 13 5 - 15  Urinalysis, Routine w reflex microscopic (not at [facility])  Result Value Ref Range   Color, Urine ORANGE (A) YELLOW   APPearance CLOUDY (A) CLEAR   Specific Gravity, Urine 1.037 (H) 1.005 - 1.030   pH 5.0 5.0 - 8.0   Glucose, UA NEGATIVE NEGATIVE mg/dL   Hgb urine dipstick NEGATIVE NEGATIVE   Bilirubin Urine LARGE (A) NEGATIVE   Ketones, ur 15 (A) NEGATIVE mg/dL   Protein, ur 100 (A) NEGATIVE mg/dL   Nitrite POSITIVE (A) NEGATIVE   Leukocytes, UA MODERATE (A) NEGATIVE  Urine microscopic-add on  Result Value Ref Range   Squamous Epithelial / LPF 6-30 (A) NONE SEEN   WBC, UA TOO NUMEROUS TO COUNT 0 - 5 WBC/hpf   RBC / HPF 0-5 0 - 5 RBC/hpf   Bacteria, UA MANY (A) NONE SEEN   Casts HYALINE CASTS (A) NEGATIVE   Crystals CA OXALATE CRYSTALS (A) NEGATIVE   Urine-Other MUCOUS PRESENT   I-stat troponin, ED (not at [facility])  Result Value Ref Range   Troponin i, poc 0.02 0.00 - 0.08 ng/mL   Comment 3          I-Stat Chem 8, ED  (not at [facility])  Result Value Ref  Range   Sodium 142 135 - 145 mmol/L   Potassium 4.1 3.5 - 5.1 mmol/L   Chloride 109 101 - 111 mmol/L   BUN 25 (H) 6 - 20 mg/dL   Creatinine, Ser 1.40 (H) 0.44 - 1.00 mg/dL   Glucose, Bld 137 (H) 65 - 99 mg/dL   Calcium, Ion 1.16 1.13 - 1.30 mmol/L   TCO2 17 0 - 100 mmol/L   Hemoglobin 17.7 (H) 12.0 - 15.0 g/dL   HCT 52.0 (H) 36.0 - 46.0 %   Ct Angio Head W/cm &/or Wo Cm  03/14/2015  CLINICAL DATA:  Altered mental status, increased lethargy and memory problems for 2 days. History of atrial fibrillation on Coumadin, now discontinued due to frequent falls. Poor historian. Assess acute stroke. EXAM: CT ANGIOGRAPHY HEAD AND NECK TECHNIQUE: Multidetector CT imaging of the head and neck was performed using the standard protocol during bolus administration of intravenous contrast. Multiplanar CT image reconstructions and MIPs were obtained to evaluate the vascular anatomy. Carotid stenosis measurements (when applicable) are obtained utilizing NASCET criteria, using the distal internal carotid diameter as the denominator. CONTRAST:  181mL OMNIPAQUE IOHEXOL 350 MG/ML SOLN COMPARISON:  MRI of the brain March 13, 2015 FINDINGS: CTA NECK Aortic arch: Normal appearance of the thoracic arch, 2 vessel arch is a normal variant. The origins of the innominate, left Common carotid artery and subclavian artery are widely patent. Right carotid system: Common carotid artery is widely patent, coursing in a straight line fashion. Normal appearance of the carotid bifurcation  without hemodynamically significant stenosis by NASCET criteria. Minimal eccentric calcific atherosclerosis. Normal appearance of the included internal carotid artery. Left carotid system: Common carotid artery is widely patent, coursing in a straight line fashion. Normal appearance of the carotid bifurcation without hemodynamically significant stenosis by NASCET criteria. Minimal eccentric calcific atherosclerosis. Normal appearance of the included  internal carotid artery. Vertebral arteries:Left vertebral artery is dominant. Normal appearance of the vertebral arteries, which appear widely patent. Skeleton: No acute osseous process though bone windows have not been submitted. Moderate to severe degenerative change of the cervical spine without destructive bony lesions. Multiple absent teeth. Other neck: Soft tissues of the neck are nonacute though, not tailored for evaluation. CTA HEAD Anterior circulation: Normal appearance of the cervical internal carotid arteries, petrous, cavernous and supra clinoid internal carotid arteries. Widely patent anterior communicating artery. Patent anterior and middle cerebral arteries. Mild stenosis LEFT M2 segment. Posterior circulation: Normal appearance of the vertebral arteries, vertebrobasilar junction and basilar artery, as well as main branch vessels. Occluded distal LEFT P1 segment. Moderate stenosis RIGHT P2 segment. No dissection, luminal irregularity, contrast extravasation or aneurysm within the anterior nor posterior circulation. No abnormal intracranial enhancement. IMPRESSION: CTA NECK:  Negative. CTA HEAD:  Occluded distal LEFT P1 segment. Moderate stenosis RIGHT P2 segment. Electronically Signed   By: Elon Alas M.D.   On: 03/14/2015 05:13   Dg Chest 2 View  03/13/2015  CLINICAL DATA:  82 year old presenting with acute mental status changes and somnolence. Right upper extremity weakness. Current history of hypertension. Personal history of breast cancer. EXAM: CHEST  2 VIEW COMPARISON:  11/19/2013 and earlier. FINDINGS: Cardiac silhouette mildly to moderately enlarged. Thoracic aorta tortuous and atherosclerotic, unchanged. Hilar and mediastinal contours otherwise unremarkable. Mild diffuse interstitial pulmonary edema. Small bilateral pleural effusions. The no confluent airspace consolidation. Degenerative changes and DISH involving the thoracic spine. IMPRESSION: Mild CHF, with mild to moderate  cardiomegaly, mild diffuse interstitial pulmonary edema and small bilateral pleural effusions. Electronically Signed   By: Evangeline Dakin M.D.   On: 03/13/2015 14:22   Ct Head Wo Contrast  04/11/2015  CLINICAL DATA:  Right-sided weakness. Diagnosis with CVA last week. Physical therapist noticed worsening right-sided weakness today. EXAM: CT HEAD WITHOUT CONTRAST TECHNIQUE: Contiguous axial images were obtained from the base of the skull through the vertex without intravenous contrast. COMPARISON:  Head CT dated 03/24/2015. FINDINGS: There is generalized brain atrophy with commensurate dilatation of the ventricles and sulci. Chronic small vessel ischemic changes again noted within the bilateral periventricular and subcortical white matter regions. Small old lacunar infarcts noted within each basal ganglia region. These findings appear stable compared to the previous head CT. There is a new small low-density focus within the lower left occipital lobe, most suggestive of subacute infarct, possibly chronic but new compared to the most recent head CT of 03/24/2015. No associated mass effect or midline shift. No parenchymal or extra-axial hemorrhage. Ventricles are stable in size and configuration. No osseous abnormality.  Superficial soft tissues are unremarkable. IMPRESSION: 1. New small low-density focus within the lower left occipital lobe, most suggestive of a small infarct of subacute or chronic age. Favor subacute infarct, possibly chronic but new compared to the most recent head CT of 03/24/2015. This could be confirmed with MRI if clinically needed. 2. Additional chronic ischemic changes, as detailed above. 3. No acute-appearing findings.  No intracranial hemorrhage. Electronically Signed   By: Franki Cabot M.D.   On: 04/11/2015 21:07   Ct Head Wo Contrast  03/24/2015  CLINICAL DATA:  Confusion with altered mental status. EXAM: CT HEAD WITHOUT CONTRAST TECHNIQUE: Contiguous axial images were obtained  from the base of the skull through the vertex without intravenous contrast. COMPARISON:  03/13/2015 FINDINGS: There is no evidence for acute hemorrhage, hydrocephalus, mass lesion, or abnormal extra-axial fluid collection. No definite CT evidence for acute infarction. Diffuse loss of parenchymal volume is consistent with atrophy. Patchy low attenuation in the deep hemispheric and periventricular white matter is nonspecific, but likely reflects chronic microvascular ischemic demyelination. The visualized paranasal sinuses and mastoid air cells are clear. IMPRESSION: Stable exam.  No acute intracranial abnormality. Atrophy with chronic small vessel white matter ischemic demyelination. Electronically Signed   By: Misty Stanley M.D.   On: 03/24/2015 17:19   Ct Head Wo Contrast  03/13/2015  CLINICAL DATA:  Altered mental status. EXAM: CT HEAD WITHOUT CONTRAST TECHNIQUE: Contiguous axial images were obtained from the base of the skull through the vertex without intravenous contrast. COMPARISON:  12/18/2012 FINDINGS: Similar findings of advanced atrophy with sulcal prominence centralized volume loss with commensurate ex vacuo dilatation of the ventricular system. Extensive nearly confluent periventricular hypodensities are unchanged compatible microvascular ischemic disease. Old lacunar infarcts within the left side of the pons (image 9, series 3) as well as the bilateral insular cortices (image 16). Given extensive background parenchymal abnormalities, there is no CT evidence of superimposed acute large territory infarct. No intraparenchymal or extra-axial mass or hemorrhage. Unchanged size and configuration of the ventricles and basilar cisterns. No midline shift. Intracranial atherosclerosis. Limited visualization of the paranasal sinuses and mastoid air cells is normal. No air-fluid levels. Debris is noted within the bilateral external auditory canals. Regional soft tissues appear normal. No displaced calvarial  fracture. IMPRESSION: Similar findings of advanced atrophy and microvascular ischemic disease without acute intracranial process. Electronically Signed   By: Sandi Mariscal M.D.   On: 03/13/2015 14:36   Ct Angio Neck W/cm &/or Wo/cm  03/14/2015  CLINICAL DATA:  Altered mental status, increased lethargy and memory problems for 2 days. History of atrial fibrillation on Coumadin, now discontinued due to frequent falls. Poor historian. Assess acute stroke. EXAM: CT ANGIOGRAPHY HEAD AND NECK TECHNIQUE: Multidetector CT imaging of the head and neck was performed using the standard protocol during bolus administration of intravenous contrast. Multiplanar CT image reconstructions and MIPs were obtained to evaluate the vascular anatomy. Carotid stenosis measurements (when applicable) are obtained utilizing NASCET criteria, using the distal internal carotid diameter as the denominator. CONTRAST:  162mL OMNIPAQUE IOHEXOL 350 MG/ML SOLN COMPARISON:  MRI of the brain March 13, 2015 FINDINGS: CTA NECK Aortic arch: Normal appearance of the thoracic arch, 2 vessel arch is a normal variant. The origins of the innominate, left Common carotid artery and subclavian artery are widely patent. Right carotid system: Common carotid artery is widely patent, coursing in a straight line fashion. Normal appearance of the carotid bifurcation without hemodynamically significant stenosis by NASCET criteria. Minimal eccentric calcific atherosclerosis. Normal appearance of the included internal carotid artery. Left carotid system: Common carotid artery is widely patent, coursing in a straight line fashion. Normal appearance of the carotid bifurcation without hemodynamically significant stenosis by NASCET criteria. Minimal eccentric calcific atherosclerosis. Normal appearance of the included internal carotid artery. Vertebral arteries:Left vertebral artery is dominant. Normal appearance of the vertebral arteries, which appear widely patent.  Skeleton: No acute osseous process though bone windows have not been submitted. Moderate to severe degenerative change of the cervical spine without destructive bony lesions. Multiple  absent teeth. Other neck: Soft tissues of the neck are nonacute though, not tailored for evaluation. CTA HEAD Anterior circulation: Normal appearance of the cervical internal carotid arteries, petrous, cavernous and supra clinoid internal carotid arteries. Widely patent anterior communicating artery. Patent anterior and middle cerebral arteries. Mild stenosis LEFT M2 segment. Posterior circulation: Normal appearance of the vertebral arteries, vertebrobasilar junction and basilar artery, as well as main branch vessels. Occluded distal LEFT P1 segment. Moderate stenosis RIGHT P2 segment. No dissection, luminal irregularity, contrast extravasation or aneurysm within the anterior nor posterior circulation. No abnormal intracranial enhancement. IMPRESSION: CTA NECK:  Negative. CTA HEAD:  Occluded distal LEFT P1 segment. Moderate stenosis RIGHT P2 segment. Electronically Signed   By: Elon Alas M.D.   On: 03/14/2015 05:13   Mr Brain Wo Contrast  03/13/2015  CLINICAL DATA:  TIA. Altered mental status and weakness for the past couple of days. EXAM: MRI HEAD WITHOUT CONTRAST TECHNIQUE: Multiplanar, multiecho pulse sequences of the brain and surrounding structures were obtained without intravenous contrast. COMPARISON:  Head CT 03/13/2015 FINDINGS: Some sequences are moderately motion degraded. There is abnormal diffusion-weighted signal involving the mesial left temporal lobe/hippocampus as well as lateral aspect of the left thalamus with evidence of mildly reduced ADC. There is at most minimal associated edema/ T2 hyperintensity. There is moderate cerebral atrophy. A few chronic cerebral microhemorrhages are noted. Confluent T2 hyperintensities in the cerebral white matter bilaterally are nonspecific but compatible with extensive  chronic small vessel ischemic disease. Chronic lacunar infarcts are noted in the right thalamus and deep cerebral white matter bilaterally. No mass, midline shift, or extra-axial fluid collection is seen. Prior bilateral cataract extraction is noted. Paranasal sinuses and mastoid air cells are clear. Major intracranial vascular flow voids are grossly preserved, although evaluation is limited by motion artifact and the left PCA is not well seen. IMPRESSION: 1. Small acute/early subacute infarcts in the left thalamus and mesial left temporal lobe. 2. Extensive chronic small vessel ischemic disease and cerebral atrophy. Electronically Signed   By: Logan Bores M.D.   On: 03/13/2015 18:09       I have personally reviewed and evaluated these images and lab results as part of my medical decision-making.   EKG Interpretation   Date/Time:  Monday April 11 2015 17:50:18 EDT Ventricular Rate:  101 PR Interval:    QRS Duration: 82 QT Interval:  326 QTC Calculation: 422 R Axis:   46 Text Interpretation:  Atrial fibrillation with rapid ventricular response  Non-specific ST-t changes Confirmed by Ashok Cordia  MD, Lennette Bihari ([id number]) on  04/11/2015 8:26:48 PM      MDM   Iv ns bolus.   Labs sent.  Reviewed nursing notes and prior charts for additional history.   uti on labs today. u culture sent.  Rocephin iv.   Also possible subacute/acute cva on ct.    Will admit to medical service.  Recheck pt - no change in exam from prior.   Discussed pt with Dr Gasper Lloyd - will admit to tele. Requests neuro consult.  Neurology paged.        Lajean Saver, MD 04/12/15 [phone]

## 2015-04-11 NOTE — ED Notes (Signed)
Per daughter, pt c/o worsening R sided weakness. Pt was recently diagnosed with a CVA last week. Has home PT for R sided weakness. Physical therapist noticed worsening right sided weakness around 3pm today. Pt c/o R knee and R hip pain, unable to fully keep leg lifted from bed. Generalized weakness also noted, pt had to be lifted from wheelchair into bed, unable to stand without assistance/

## 2015-04-11 NOTE — ED Notes (Signed)
Pt reports to the ED for eval of right sided weakness that has worsened since yesterday. Pt has a hx of stroke with deficits on this side. Pts daughter reports she has also been more lethargic than normal as well. Pt denies any HA. Pt alert but more confused than normal. Resp e/u and skin warm and dry.

## 2015-04-11 NOTE — Telephone Encounter (Signed)
She just saw Rose Sullivan and she was definitely different and coordination was off and noted nystagmus when looking to right. She is afraid she may have had another stroke.  She said family said last night she was calling out for her "mommy". She is going to go back to the home and help the family get her to the ED for assessment.  VSS and no obvious distress to require EMS but there was definitely a change in her ability to move and mental status.

## 2015-04-12 ENCOUNTER — Inpatient Hospital Stay (HOSPITAL_COMMUNITY): Payer: Medicare Other

## 2015-04-12 DIAGNOSIS — M6289 Other specified disorders of muscle: Secondary | ICD-10-CM

## 2015-04-12 DIAGNOSIS — Z7901 Long term (current) use of anticoagulants: Secondary | ICD-10-CM | POA: Diagnosis not present

## 2015-04-12 DIAGNOSIS — E669 Obesity, unspecified: Secondary | ICD-10-CM | POA: Diagnosis present

## 2015-04-12 DIAGNOSIS — I480 Paroxysmal atrial fibrillation: Secondary | ICD-10-CM

## 2015-04-12 DIAGNOSIS — Z9010 Acquired absence of unspecified breast and nipple: Secondary | ICD-10-CM | POA: Diagnosis not present

## 2015-04-12 DIAGNOSIS — R4182 Altered mental status, unspecified: Secondary | ICD-10-CM | POA: Diagnosis not present

## 2015-04-12 DIAGNOSIS — M8580 Other specified disorders of bone density and structure, unspecified site: Secondary | ICD-10-CM | POA: Diagnosis present

## 2015-04-12 DIAGNOSIS — Z9049 Acquired absence of other specified parts of digestive tract: Secondary | ICD-10-CM | POA: Diagnosis not present

## 2015-04-12 DIAGNOSIS — I10 Essential (primary) hypertension: Secondary | ICD-10-CM | POA: Diagnosis present

## 2015-04-12 DIAGNOSIS — H548 Legal blindness, as defined in USA: Secondary | ICD-10-CM | POA: Diagnosis present

## 2015-04-12 DIAGNOSIS — Z823 Family history of stroke: Secondary | ICD-10-CM | POA: Diagnosis not present

## 2015-04-12 DIAGNOSIS — E1159 Type 2 diabetes mellitus with other circulatory complications: Secondary | ICD-10-CM | POA: Diagnosis not present

## 2015-04-12 DIAGNOSIS — G9340 Encephalopathy, unspecified: Secondary | ICD-10-CM | POA: Diagnosis present

## 2015-04-12 DIAGNOSIS — Z79899 Other long term (current) drug therapy: Secondary | ICD-10-CM | POA: Diagnosis not present

## 2015-04-12 DIAGNOSIS — Z8249 Family history of ischemic heart disease and other diseases of the circulatory system: Secondary | ICD-10-CM | POA: Diagnosis not present

## 2015-04-12 DIAGNOSIS — I63532 Cerebral infarction due to unspecified occlusion or stenosis of left posterior cerebral artery: Secondary | ICD-10-CM | POA: Diagnosis present

## 2015-04-12 DIAGNOSIS — N390 Urinary tract infection, site not specified: Secondary | ICD-10-CM | POA: Diagnosis present

## 2015-04-12 DIAGNOSIS — Z6836 Body mass index (BMI) 36.0-36.9, adult: Secondary | ICD-10-CM | POA: Diagnosis not present

## 2015-04-12 DIAGNOSIS — G4710 Hypersomnia, unspecified: Secondary | ICD-10-CM | POA: Diagnosis present

## 2015-04-12 DIAGNOSIS — I639 Cerebral infarction, unspecified: Secondary | ICD-10-CM | POA: Diagnosis not present

## 2015-04-12 DIAGNOSIS — Z79818 Long term (current) use of other agents affecting estrogen receptors and estrogen levels: Secondary | ICD-10-CM | POA: Diagnosis not present

## 2015-04-12 DIAGNOSIS — Z800 Family history of malignant neoplasm of digestive organs: Secondary | ICD-10-CM | POA: Diagnosis not present

## 2015-04-12 DIAGNOSIS — N179 Acute kidney failure, unspecified: Secondary | ICD-10-CM

## 2015-04-12 DIAGNOSIS — E860 Dehydration: Secondary | ICD-10-CM | POA: Diagnosis present

## 2015-04-12 DIAGNOSIS — Z825 Family history of asthma and other chronic lower respiratory diseases: Secondary | ICD-10-CM | POA: Diagnosis not present

## 2015-04-12 DIAGNOSIS — R531 Weakness: Secondary | ICD-10-CM | POA: Insufficient documentation

## 2015-04-12 DIAGNOSIS — E785 Hyperlipidemia, unspecified: Secondary | ICD-10-CM | POA: Diagnosis present

## 2015-04-12 DIAGNOSIS — I69351 Hemiplegia and hemiparesis following cerebral infarction affecting right dominant side: Secondary | ICD-10-CM | POA: Diagnosis not present

## 2015-04-12 DIAGNOSIS — K219 Gastro-esophageal reflux disease without esophagitis: Secondary | ICD-10-CM | POA: Diagnosis present

## 2015-04-12 DIAGNOSIS — H9190 Unspecified hearing loss, unspecified ear: Secondary | ICD-10-CM | POA: Diagnosis present

## 2015-04-12 DIAGNOSIS — Z87891 Personal history of nicotine dependence: Secondary | ICD-10-CM | POA: Diagnosis not present

## 2015-04-12 DIAGNOSIS — I959 Hypotension, unspecified: Secondary | ICD-10-CM | POA: Diagnosis present

## 2015-04-12 DIAGNOSIS — F411 Generalized anxiety disorder: Secondary | ICD-10-CM | POA: Diagnosis present

## 2015-04-12 DIAGNOSIS — Z8673 Personal history of transient ischemic attack (TIA), and cerebral infarction without residual deficits: Secondary | ICD-10-CM | POA: Insufficient documentation

## 2015-04-12 DIAGNOSIS — E119 Type 2 diabetes mellitus without complications: Secondary | ICD-10-CM

## 2015-04-12 DIAGNOSIS — Z853 Personal history of malignant neoplasm of breast: Secondary | ICD-10-CM | POA: Diagnosis not present

## 2015-04-12 DIAGNOSIS — I4891 Unspecified atrial fibrillation: Secondary | ICD-10-CM | POA: Diagnosis present

## 2015-04-12 DIAGNOSIS — R413 Other amnesia: Secondary | ICD-10-CM | POA: Diagnosis present

## 2015-04-12 LAB — COMPREHENSIVE METABOLIC PANEL
ALT: 21 U/L (ref 14–54)
ANION GAP: 11 (ref 5–15)
AST: 32 U/L (ref 15–41)
Albumin: 3.5 g/dL (ref 3.5–5.0)
Alkaline Phosphatase: 63 U/L (ref 38–126)
BILIRUBIN TOTAL: 1.2 mg/dL (ref 0.3–1.2)
BUN: 23 mg/dL — AB (ref 6–20)
CHLORIDE: 113 mmol/L — AB (ref 101–111)
CO2: 19 mmol/L — ABNORMAL LOW (ref 22–32)
Calcium: 8.7 mg/dL — ABNORMAL LOW (ref 8.9–10.3)
Creatinine, Ser: 1.41 mg/dL — ABNORMAL HIGH (ref 0.44–1.00)
GFR calc Af Amer: 40 mL/min — ABNORMAL LOW (ref >60)
GFR, EST NON AFRICAN AMERICAN: 34 mL/min — AB (ref >60)
Glucose, Bld: 102 mg/dL — ABNORMAL HIGH (ref 65–99)
POTASSIUM: 3.6 mmol/L (ref 3.5–5.1)
Sodium: 143 mmol/L (ref 135–145)
TOTAL PROTEIN: 6.9 g/dL (ref 6.5–8.1)

## 2015-04-12 LAB — CBC WITH DIFFERENTIAL/PLATELET
BASOS ABS: 0 10*3/uL (ref 0.0–0.1)
Basophils Relative: 0 %
EOS PCT: 3 %
Eosinophils Absolute: 0.3 10*3/uL (ref 0.0–0.7)
HEMATOCRIT: 41 % (ref 36.0–46.0)
HEMOGLOBIN: 13.5 g/dL (ref 12.0–15.0)
LYMPHS ABS: 1.9 10*3/uL (ref 0.7–4.0)
LYMPHS PCT: 23 %
MCH: 30.5 pg (ref 26.0–34.0)
MCHC: 32.9 g/dL (ref 30.0–36.0)
MCV: 92.6 fL (ref 78.0–100.0)
Monocytes Absolute: 0.5 10*3/uL (ref 0.1–1.0)
Monocytes Relative: 6 %
NEUTROS ABS: 5.6 10*3/uL (ref 1.7–7.7)
NEUTROS PCT: 68 %
PLATELETS: 201 10*3/uL (ref 150–400)
RBC: 4.43 MIL/uL (ref 3.87–5.11)
RDW: 13 % (ref 11.5–15.5)
WBC: 8.3 10*3/uL (ref 4.0–10.5)

## 2015-04-12 LAB — MAGNESIUM: Magnesium: 2 mg/dL (ref 1.7–2.4)

## 2015-04-12 MED ORDER — RESOURCE THICKENUP CLEAR PO POWD
ORAL | Status: DC | PRN
  Filled 2015-04-12: qty 125

## 2015-04-12 MED ORDER — EXEMESTANE 25 MG PO TABS
25.0000 mg | ORAL_TABLET | Freq: Every day | ORAL | Status: DC
  Administered 2015-04-12 – 2015-04-15 (×4): 25 mg via ORAL
  Filled 2015-04-12 (×7): qty 1

## 2015-04-12 MED ORDER — DILTIAZEM HCL ER COATED BEADS 240 MG PO CP24
240.0000 mg | ORAL_CAPSULE | Freq: Every day | ORAL | Status: DC
  Administered 2015-04-12 – 2015-04-15 (×4): 240 mg via ORAL
  Filled 2015-04-12 (×4): qty 1

## 2015-04-12 MED ORDER — STROKE: EARLY STAGES OF RECOVERY BOOK
Freq: Once | Status: AC
  Administered 2015-04-12: 02:00:00
  Filled 2015-04-12: qty 1

## 2015-04-12 MED ORDER — APIXABAN 5 MG PO TABS
5.0000 mg | ORAL_TABLET | Freq: Two times a day (BID) | ORAL | Status: DC
  Administered 2015-04-12 – 2015-04-15 (×6): 5 mg via ORAL
  Filled 2015-04-12 (×6): qty 1

## 2015-04-12 MED ORDER — PRAVASTATIN SODIUM 40 MG PO TABS
40.0000 mg | ORAL_TABLET | Freq: Every day | ORAL | Status: DC
  Administered 2015-04-12 – 2015-04-14 (×3): 40 mg via ORAL
  Filled 2015-04-12 (×3): qty 1

## 2015-04-12 MED ORDER — ASPIRIN 300 MG RE SUPP
300.0000 mg | Freq: Every day | RECTAL | Status: DC
  Filled 2015-04-12: qty 1

## 2015-04-12 MED ORDER — DEXTROSE 5 % IV SOLN
1.0000 g | INTRAVENOUS | Status: DC
  Administered 2015-04-12 – 2015-04-14 (×3): 1 g via INTRAVENOUS
  Filled 2015-04-12 (×4): qty 10

## 2015-04-12 MED ORDER — APIXABAN 5 MG PO TABS
5.0000 mg | ORAL_TABLET | Freq: Once | ORAL | Status: AC
  Administered 2015-04-12: 5 mg via ORAL
  Filled 2015-04-12: qty 1

## 2015-04-12 MED ORDER — DIPHENHYDRAMINE HCL 25 MG PO CAPS
25.0000 mg | ORAL_CAPSULE | Freq: Every evening | ORAL | Status: DC | PRN
  Administered 2015-04-12 – 2015-04-14 (×2): 25 mg via ORAL
  Filled 2015-04-12 (×2): qty 1

## 2015-04-12 MED ORDER — ASPIRIN 325 MG PO TABS
325.0000 mg | ORAL_TABLET | Freq: Every day | ORAL | Status: DC
Start: 2015-04-12 — End: 2015-04-14
  Administered 2015-04-12 – 2015-04-14 (×3): 325 mg via ORAL
  Filled 2015-04-12 (×3): qty 1

## 2015-04-12 MED ORDER — SODIUM CHLORIDE 0.9 % IV SOLN
INTRAVENOUS | Status: AC
  Administered 2015-04-12: 02:00:00 via INTRAVENOUS

## 2015-04-12 MED ORDER — ACETAMINOPHEN 650 MG RE SUPP
650.0000 mg | Freq: Four times a day (QID) | RECTAL | Status: DC | PRN
  Administered 2015-04-12: 650 mg via RECTAL
  Filled 2015-04-12: qty 1

## 2015-04-12 MED ORDER — METOPROLOL TARTRATE 12.5 MG HALF TABLET
12.5000 mg | ORAL_TABLET | Freq: Two times a day (BID) | ORAL | Status: DC
  Administered 2015-04-12 – 2015-04-15 (×6): 12.5 mg via ORAL
  Filled 2015-04-12 (×7): qty 1

## 2015-04-12 MED ORDER — SENNOSIDES-DOCUSATE SODIUM 8.6-50 MG PO TABS
1.0000 | ORAL_TABLET | Freq: Every evening | ORAL | Status: DC | PRN
Start: 2015-04-12 — End: 2015-04-15

## 2015-04-12 MED ORDER — CAMPHOR-MENTHOL 0.5-0.5 % EX LOTN
TOPICAL_LOTION | CUTANEOUS | Status: DC | PRN
  Administered 2015-04-12: 03:00:00 via TOPICAL
  Filled 2015-04-12: qty 222

## 2015-04-12 NOTE — Progress Notes (Addendum)
STROKE TEAM PROGRESS NOTE   HISTORY OF PRESENT ILLNESS Rose Sullivan is an 82 y.o. female with hx of afib, INR is 1.76, had L thalamic stroke with R sided weakness one month ago and was seen here at [facility]. Patient found to have a UTI on admission which is most likely responsible for her confusion and worsening anemistic response with R sided weakness. Patient was not administered IV t-PA secondary to non-stroke presentation. She was admitted for further evaluation and treatment.   SUBJECTIVE (INTERVAL HISTORY) No family is at the bedside.  Overall she feels her condition is stable this am. She has no complaints. Legally blind as per pt and chart. Able to see hand waving. Disorientated to time and president. Right UE weakness.   OBJECTIVE Temp:  [97.4 F (36.3 C)-98.4 F (36.9 C)] 97.6 F (36.4 C) (03/14 1100) Pulse Rate:  [50-84] 76 (03/14 1100) Cardiac Rhythm:  [-] Normal sinus rhythm (03/14 0900) Resp:  [14-23] 16 (03/14 0538) BP: (112-169)/(61-96) 150/72 mmHg (03/14 1100) SpO2:  [96 %-100 %] 98 % (03/14 0900)  CBC:   Recent Labs Lab 04/11/15 1821 04/12/15 0939  WBC 11.1* 8.3  NEUTROABS 7.1 5.6  HGB 16.0* 13.5  HCT 47.3* 41.0  MCV 92.4 92.6  PLT 251 [id number]    Basic Metabolic Panel:   Recent Labs Lab 04/11/15 1821 04/12/15 0939  NA 141 143  K 4.4 3.6  CL 111 113*  CO2 17* 19*  GLUCOSE 140* 102*  BUN 22* 23*  CREATININE 1.60* 1.41*  CALCIUM 9.5 8.7*  MG  --  2.0    Lipid Panel:     Component Value Date/Time   CHOL 176 03/14/2015 0039   TRIG 71 03/14/2015 0039   TRIG 134 12/18/2005 1533   HDL 50 03/14/2015 0039   CHOLHDL 3.5 03/14/2015 0039   CHOLHDL 4.4 CALC 12/18/2005 1533   VLDL 14 03/14/2015 0039   LDLCALC 112* 03/14/2015 0039   HgbA1c:  Lab Results  Component Value Date   HGBA1C 5.7* 03/14/2015   Urine Drug Screen:     Component Value Date/Time   LABOPIA POSITIVE* 03/13/2015 1317   LABOPIA PPS 08/19/2014 1354   COCAINSCRNUR NONE DETECTED  03/13/2015 1317   COCAINSCRNUR NEG 08/19/2014 1354   LABBENZ POSITIVE* 03/13/2015 1317   LABBENZ NEG 08/19/2014 1354   AMPHETMU NONE DETECTED 03/13/2015 1317   AMPHETMU NEG 08/19/2014 1354   THCU NONE DETECTED 03/13/2015 1317   THCU NEG 08/19/2014 1354   LABBARB NONE DETECTED 03/13/2015 1317   LABBARB NEG 08/19/2014 1354      IMAGING I have personally reviewed the radiological images below and agree with the radiology interpretations.  Ct Head Wo Contrast 04/11/2015   1. New small low-density focus within the lower left occipital lobe, most suggestive of a small infarct of subacute or chronic age. Favor subacute infarct, possibly chronic but new compared to the most recent head CT of 03/24/2015. This could be confirmed with MRI if clinically needed. 2. Additional chronic ischemic changes, as detailed above. 3. No acute-appearing findings.  No intracranial hemorrhage.   MRI HEAD:  04/12/2015  Acute LEFT posterior cerebral artery territory infarcts (LEFT occipital lobe, LEFT thalamus). No hemorrhagic conversion. Please note, this is in similar vascular territory to recent infarcts. Severe chronic small vessel ischemic disease. Stable moderate to severe global parenchymal brain volume loss.  MRA HEAD 04/12/2015  Moderately motion degraded examination. Chronically occluded LEFT posterior cerebral artery. Stenotic RIGHT posterior cerebral artery better characterized on prior  CTA head.   2D echo 03/14/15 - Left ventricle: The cavity size was normal. There was mild focal  basal hypertrophy of the septum. Systolic function was normal.  The estimated ejection fraction was in the range of 55% to 60%.  Wall motion was normal; there were no regional wall motion  abnormalities. - Mitral valve: There was mild regurgitation. - Pulmonic valve: There was trivial regurgitation.  CTA head and neck 03/14/15 CTA NECK: Negative. CTA HEAD: Occluded distal LEFT P1 segment. Moderate stenosis RIGHT P2  segment.  PHYSICAL EXAM  Temp:  [97.4 F (36.3 C)-98.4 F (36.9 C)] 98 F (36.7 C) (03/14 1257) Pulse Rate:  [50-84] 66 (03/14 1257) Resp:  [14-23] 16 (03/14 0538) BP: (112-169)/(60-96) 145/60 mmHg (03/14 1257) SpO2:  [96 %-100 %] 98 % (03/14 1257)  General - obese, well developed, in no apparent distress.  Ophthalmologic - Fundi not visualized due to noncooperation.  Cardiovascular - Regular rate and rhythm with no murmur, not in afib.  Mental Status -  Level of arousal and orientation to place and self were intact, but not orientated to age, time or people. Language including repetition, comprehension was assessed and found intact, but paucity of speech with naming 1/4, but could be due to vision loss. Fund of Knowledge was assessed and was impaired without knowing current presidents.  Cranial Nerves II - XII - II - Visual field testing not consistent as legally blind III, IV, VI - Extraocular movements intact. V - Facial sensation intact bilaterally. VII - Facial movement intact bilaterally. VIII - hearing & vestibular intact bilaterally. X - Palate elevates symmetrically, mild dysarthria. XI - Chin turning & shoulder shrug intact bilaterally. XII - Tongue protrusion intact.  Motor Strength - The patient's strength was RUE proximal 3+/5, distal 4/5, RLE proximal and distal 4/5. LUE and LLE 5/5. Bulk was normal and fasciculations were absent.  Motor Tone - Muscle tone was assessed at the neck and appendages and was normal.  Reflexes - The patient's reflexes were 1+ in all extremities and she had no pathological reflexes.  Sensory - Light touch, temperature/pinprick were assessed and were symmetrical.   Coordination - not cooperative on exam. Tremor was absent.  Gait and Station - not tested due to safety concerns.   ASSESSMENT/PLAN Rose Sullivan is a 82 y.o. female with history of atrial fibrillation, L thalamic stroke 1 mo ago with R HP presenting with  worsening confusion and right-sided weakness. She did not receive IV t-PA due to UTI on admission which most likely is responsible for confusion and worsening hemiparesis.    Stroke: acute L PCA territory infarcts, extended from her left PCA stroke in 03/2015. This pattern dose not consistent with afib cardioembolism, but more likely due to UTI with sepsis and dehydration causing possible hypotension then leading to infarct in the same territory  Resultant - mild worsening of right UE weakness than one month ago  MRI  L PCA territory infarcts (left occipital and left thalamic)  MRA  Chronic left PCA occlusion. Stenotic right PCA.  No further stroke workup indicated (had complete work up in 03/2015)  Eliquis for VTE prophylaxis DIET DYS 2 Room service appropriate?: Yes; Fluid consistency:: Nectar Thick  Eliquis (apixaban) daily prior to admission, now on Eliquis (apixaban) daily. Continue eliquis for stroke prevntion  Patient counseled to be compliant with her antithrombotic medications  Ongoing aggressive stroke risk factor management  Therapy recommendations:  pending  Disposition:  pending  Atrial Fibrillation  Home  anticoagulation:  Eliquis (apixaban) daily   Continue at discharge  UTI and dehydration  Elevated Cre indicating dehydration  UA WBC TNTC  On rocephin  UCx pending  Avoid low BP  Keep hydrated   BP management  Avoid low BP  Treat for UTI and ? Sepsis  Long term BP goal 120-150  Hyperlipidemia  Home meds:  Pravachol 40, resumed in hospital  LDL 112 in , February, goal < 70  Continue statin at discharge  Other Stroke Risk Factors  Advanced age  Obesity, There is no weight on file to calculate BMI.   Hx stroke/TIA  03/2015 - L thalamic and L mesial temporal lobe infarct embolic secondary to known atrial fibrillation placed on eliquis    Family hx stroke (mother)  Other Active Problems  Legally blind  Acute on chronic diastolic  HF  Chronic back pain  Insomnia  Hx metastatic breast cancer, in remission   NOTHING FURTHER TO ADD FROM THE STROKE STANDPOINT  Patient has a 10-15% risk of having another stroke over the next year, the highest risk is within 2 weeks of the most recent stroke/TIA (risk of having a stroke following a stroke or TIA is the same).  Ongoing risk factor control by Primary Care Physician  Stroke Service will sign off. Please call should any needs arise.  Keep Follow-up Stroke Clinic appointment already scheduled at [facility] with Cecille Rubin, NP , on 05/12/2015 at 1:30 PM  Hospital day # 0  Neurology will sign off. Please call with questions. Pt will follow up with Cecille Rubin NP at [facility] on 05/12/15. Thanks for the consult.  Rosalin Hawking, MD PhD Stroke Neurology 04/12/2015 4:30 PM   To contact Stroke Continuity provider, please refer to [email or web address] After hours, contact General Neurology

## 2015-04-12 NOTE — Progress Notes (Signed)
It was reported by patient's child at admission that Eliquis 5mg  was only being taken once daily.  Discharge summary from 03/27/25 by Dr. Dianna Limbo clearly states to continue Eliquis 5mg  BID.  Called patient's [facility] and confirmed that the prescription is written for 5mg  BID- last filled on 3/6.  There is no approved indication for Eliquis 5mg  daily. I have changed the patient's inpatient order to 5mg  BID- this should be continued at discharge.  Pharmacy will attempt to counsel patient/daughter prior to discharge.  Nyana Haren D. Ladeana Laplant, PharmD, BCPS Clinical Pharmacist Pager: [phone] 04/12/2015 2:24 PM

## 2015-04-12 NOTE — Progress Notes (Signed)
Pt tolerated Dysphagia diet with not problems

## 2015-04-12 NOTE — Progress Notes (Signed)
I have seen and assessed Rose Sullivan and agree with Dr. Arnoldo Morale assessment and plan. Rose Sullivan is a 82 year old female history of atrial fibrillation on chronic anticoagulation with eliquis , prior history of CVA with right-sided hemiparesis on 03/16/2015 presented to the ED with complaints of worsening right-sided weakness and confusion over the past 24 hours prior to admission. Rose Sullivan also noted to have a urinary tract infection. MRI of the head with acute left PCA territory infarcts which per neurology feel is an extension from her prior left PCA stroke on 2 /2017 and likely secondary to acute infection from urinary tract infection and dehydration likely leading to hypotension and decreased perfusion to that area. Continue IV Rocephin for treatment of UTI. Keep hydrated with goal systolic blood pressure between 120-150. Continue anticoagulation with eliquis.

## 2015-04-12 NOTE — Care Management Note (Signed)
Case Management Note  Patient Details  Name: Rose Sullivan MRN: [id number] Date of Birth: 04-Feb-1933  Subjective/Objective:                 Rose Sullivan is a 82 y.o. female admitted with worsened right sided weakness, and Confusion. PMH: Atrial Fibrillation, HTN and previous CVA with right sided Hemiparesis (03/16/2015). MRI Acute LEFT posterior cerebral artery territory infarcts (LEFT occipital lobe, LEFT thalamus). Recent d/c from CIR(03/09/15).   Action/Plan:  Awaiting PT, OT evaluations....Marland KitchenMarland KitchenCM to f/u with d/c need. Expected Discharge Date:                  Expected Discharge Plan:  Home/Self Care  In-House Referral:  Clinical Social Work  Discharge planning Services  CM Consult  Post Acute Care Choice:    Choice offered to:     DME Arranged:    DME Agency:     HH Arranged:    HH Agency:     Status of Service:  In process, will continue to follow  Medicare Important Message Given:    Date Medicare IM Given:    Medicare IM give by:    Date Additional Medicare IM Given:    Additional Medicare Important Message give by:     If discussed at [place] of Stay Meetings, dates discussed:    Additional Comments: Blaise Knaus (Daughter) [phone]  Whitman Hero [place], [place] [phone] 04/12/2015, 11:56 AM

## 2015-04-12 NOTE — Progress Notes (Signed)
12:44 AM report received

## 2015-04-12 NOTE — Progress Notes (Signed)
UR COMPLETED  

## 2015-04-12 NOTE — Consult Note (Signed)
NEURO HOSPITALIST CONSULT NOTE      Reason for Consult: [place]- shows subacute/chronic infract in the L occipital area   History obtained from:  Patient and Chart    HPI:                                                                                                                                          Rose Sullivan is an 82 y.o. female with hx as below presents after worsening confusion and R sided weakness. Patient has hx of afib, INR is 1.76, had L thalamic stroke with R sided weakness one month ago and [facility] Patient found to have a UTI on admission which is most likely responsible for her confusion and worsening anemistic response with R sided weakness.   Past Medical History  Diagnosis Date  . GLUCOSE INTOLERANCE 02/11/2007  . HYPERLIPIDEMIA 02/11/2007  . BLINDNESS, [place], [place] DEFINITION 11/25/2006  . Other specified forms of hearing loss 09/05/2009  . HYPERTENSION 02/11/2007  . Atrial fibrillation ([place]) 03/07/2009  . GERD 02/11/2007  . LOW BACK PAIN, CHRONIC 11/25/2006  . BACK PAIN 12/10/2007  . Pain in Soft Tissues of Limb 02/11/2007  . OSTEOPENIA 02/11/2007  . FATIGUE 12/10/2007  . GAIT DISTURBANCE 12/10/2007  . DYSPNEA 03/18/2009  . Cough 03/07/2009  . CHEST PAIN 12/10/2007  . Abdominal pain, left lower quadrant 04/01/2007  . BREAST CANCER, HX OF 11/25/2006  . Nocturia 05/18/2010  . Hypersomnia 05/18/2010  . Impaired glucose tolerance 05/18/2010  . Allergic rhinitis, cause unspecified 07/23/2012  . Anxiety state, unspecified 02/04/2013  . Cancer [facility])     Past Surgical History  Procedure Laterality Date  . Dilation and curettage of uterus    . S/p mastectomy    . Appendectomy      Family History  Problem Relation Age of Onset  . Hypertension Brother   . Lung cancer Brother   . Colon cancer Father   . COPD Brother   . Stroke Mother     Social History:  reports that she has quit smoking. She has never used smokeless tobacco. She  reports that she does not drink alcohol or use illicit drugs.  Allergies  Allergen Reactions  . Trazodone And Nefazodone Other (See Comments)    hallucinations    MEDICATIONS:  I have reviewed the patient's current medications.   ROS:                                                                                                                                       History obtained from chart review and the patient  General ROS: negative for - chills, fatigue, fever, night sweats, weight gain or weight loss Psychological ROS: negative for - behavioral disorder, hallucinations, memory difficulties, mood swings or suicidal ideation Ophthalmic ROS: negative for - blurry vision, double vision, eye pain or loss of vision ENT ROS: negative for - epistaxis, nasal discharge, oral lesions, sore throat, tinnitus or vertigo Allergy and Immunology ROS: negative for - hives or itchy/watery eyes Hematological and Lymphatic ROS: negative for - bleeding problems, bruising or swollen lymph nodes Endocrine ROS: negative for - galactorrhea, hair pattern changes, polydipsia/polyuria or temperature intolerance Respiratory ROS: negative for - cough, hemoptysis, shortness of breath or wheezing Cardiovascular ROS: negative for - chest pain, dyspnea on exertion, edema or irregular heartbeat Gastrointestinal ROS: negative for - abdominal pain, diarrhea, hematemesis, nausea/vomiting or stool incontinence Genito-Urinary ROS: negative for - dysuria, hematuria, incontinence or urinary frequency/urgency Musculoskeletal ROS: negative for - joint swelling or muscular weakness Neurological ROS: as noted in HPI Dermatological ROS: negative for rash and skin lesion changes   Blood pressure 113/61, pulse 58, temperature 97.4 F (36.3 C), temperature source Oral, resp. rate 20, SpO2 97 %.   Neurologic  Examination:                                                                                                      HEENT-  Normocephalic, no lesions, without obvious abnormality.  Normal external eye and conjunctiva.  Normal TM's bilaterally.  Normal auditory canals and external ears. Normal external nose, mucus membranes and septum.  Normal pharynx. Cardiovascular- irregularly irregular rhythm, pulses palpable throughout   Lungs- chest clear, no wheezing, rales, normal symmetric air entry, Heart exam - S1, S2 normal, no murmur, no gallop, rate regular Abdomen- soft, non-tender; bowel sounds normal; no masses,  no organomegaly Extremities- no edema Musculoskeletal-no joint tenderness, deformity or swelling Skin-warm and dry, no hyperpigmentation, vitiligo, or suspicious lesions  Neurological Examination Mental Status: Alert, oriented x 2, not to date. thought content appropriate.  Speech fluent without evidence of aphasia.  Able to follow 2 step commands without difficulty. Cranial Nerves: II:  Visual fields grossly normal, pupils equal, round, reactive to light III,IV, VI: ptosis not present, extra-ocular motions  intact bilaterally, at primary gaze does have some exotrophia of her L eye. States she is legally blind in both eyes and has been since birth. V,VII: smile symmetric, facial light touch sensation normal bilaterally VIII: hearing normal bilaterally IX,X: uvula rises symmetrically XI: bilateral shoulder shrug XII: midline tongue extension Motor: Right : Upper extremity   5/5    Left:     Upper extremity   3/5  Lower extremity   5/5     Lower extremity  3/5 Tone and bulk:normal tone throughout; no atrophy noted Sensory: Pinprick and light touch intact throughout, bilaterally Cerebellar: normal finger-to-nose   Lab Results: Basic Metabolic Panel:  Recent Labs Lab 04/11/15 1817 04/11/15 1821  NA 142 141  K 4.1 4.4  CL 109 111  CO2  --  17*  GLUCOSE 137* 140*  BUN 25* 22*   CREATININE 1.40* 1.60*  CALCIUM  --  9.5    Liver Function Tests:  Recent Labs Lab 04/11/15 1821  AST 44*  ALT 24  ALKPHOS 72  BILITOT 1.2  PROT 7.7  ALBUMIN 3.7   No results for input(s): LIPASE, AMYLASE in the last 168 hours. No results for input(s): AMMONIA in the last 168 hours.  CBC:  Recent Labs Lab 04/11/15 1817 04/11/15 1821  WBC  --  11.1*  NEUTROABS  --  7.1  HGB 17.7* 16.0*  HCT 52.0* 47.3*  MCV  --  92.4  PLT  --  251    Cardiac Enzymes: No results for input(s): CKTOTAL, CKMB, CKMBINDEX, TROPONINI in the last 168 hours.  Lipid Panel: No results for input(s): CHOL, TRIG, HDL, CHOLHDL, VLDL, LDLCALC in the last 168 hours.  CBG: No results for input(s): GLUCAP in the last 168 hours.  Microbiology: Results for orders placed or performed during the hospital encounter of 03/16/15  Urine culture     Status: None   Collection Time: 03/26/15 12:54 AM  Result Value Ref Range Status   Specimen Description URINE, CLEAN CATCH  Final   Special Requests NONE  Final   Culture MULTIPLE SPECIES PRESENT, SUGGEST RECOLLECTION  Final   Report Status 03/27/2015 FINAL  Final  Urine culture     Status: None   Collection Time: 03/28/15  6:50 PM  Result Value Ref Range Status   Specimen Description URINE, CLEAN CATCH  Final   Special Requests NONE  Final   Culture NO GROWTH 2 DAYS  Final   Report Status 03/30/2015 FINAL  Final    Coagulation Studies:  Recent Labs  04/11/15 1821  LABPROT 20.5*  INR 1.76*    Imaging: Ct Head Wo Contrast  04/11/2015  CLINICAL DATA:  Right-sided weakness. Diagnosis with CVA last week. Physical therapist noticed worsening right-sided weakness today. EXAM: CT HEAD WITHOUT CONTRAST TECHNIQUE: Contiguous axial images were obtained from the base of the skull through the vertex without intravenous contrast. COMPARISON:  Head CT dated 03/24/2015. FINDINGS: There is generalized brain atrophy with commensurate dilatation of the  ventricles and sulci. Chronic small vessel ischemic changes again noted within the bilateral periventricular and subcortical white matter regions. Small old lacunar infarcts noted within each basal ganglia region. These findings appear stable compared to the previous head CT. There is a new small low-density focus within the lower left occipital lobe, most suggestive of subacute infarct, possibly chronic but new compared to the most recent head CT of 03/24/2015. No associated mass effect or midline shift. No parenchymal or extra-axial hemorrhage. Ventricles are stable in size and  configuration. No osseous abnormality.  Superficial soft tissues are unremarkable. IMPRESSION: 1. New small low-density focus within the lower left occipital lobe, most suggestive of a small infarct of subacute or chronic age. Favor subacute infarct, possibly chronic but new compared to the most recent head CT of 03/24/2015. This could be confirmed with MRI if clinically needed. 2. Additional chronic ischemic changes, as detailed above. 3. No acute-appearing findings.  No intracranial hemorrhage. Electronically Signed   By: Franki Cabot M.D.   On: 04/11/2015 21:07      Assessment/Plan:81 y.o. female with hx as below presents after worsening confusion and R sided weakness. Patient has hx of afib, INR is 1.76, on eliquis,  had L thalamic stroke with R sided weakness one month ago and [facility]. Patient found to have a UTI on admission which is most likely responsible for her confusion and worsening amnestic response with R sided weakness.  Mild leukocytosis most likely from UTI Mild AKI Both can be attributing to her mild encephalopathy and amnestic weakness  Subacute/chronic new L occipital infract, recommendations below. 1. HgbA1c, fasting lipid panel 2. MRI, MRA  of the brain without contrast 3. PT consult, OT consult, Speech consult 4. Echocardiogram 5. Carotid dopplers 6. Prophylactic  therapy-Anticoagulation: Eliquis   7. Risk factor modification 8. Telemetry monitoring 9. Frequent neuro checks 10 NPO until passes stroke swallow screen

## 2015-04-12 NOTE — ED Notes (Signed)
Admitting MD at bedside.

## 2015-04-12 NOTE — Progress Notes (Signed)
Pharmacy Antibiotic Note  Rose Sullivan is a 82 y.o. female admitted on 04/11/2015 with UTI.  Pharmacy has been consulted for Rocephin dosing.  Plan: Rocephin 1gm IV q24h Pharmacy will sign off - please reconsult if needed     Temp (24hrs), Avg:97.4 F (36.3 C), Min:97.4 F (36.3 C), Max:97.4 F (36.3 C)   Recent Labs Lab 04/11/15 1817 04/11/15 1821  WBC  --  11.1*  CREATININE 1.40* 1.60*    CrCl cannot be calculated (Unknown ideal weight.).    Allergies  Allergen Reactions  . Trazodone And Nefazodone Other (See Comments)    hallucinations    Thank you for allowing pharmacy to be a part of this patient's care.  Sherlon Handing, PharmD, BCPS Clinical pharmacist, pager [phone] 04/12/2015 4:14 AM

## 2015-04-12 NOTE — H&P (Signed)
[facility] Admission History and Physical       NAZIA Sullivan [id number] DOB: Jun 09, 1933 DOA: 04/11/2015  Referring physician: EDP PCP: [name], MD  Specialists:   Chief Complaint: Worsened Right sided Weakness, and Confusion  HPI: Rose Sullivan is a 82 y.o. female with a history of Atrial Fibrillation on Eliquis Rx, HTN and previous CVA with right sided Hemiparesis ( on 03/16/2015) who presents to the ED with complaints of worsening right sided weakness and confusion over the 24 hours.  She is able to speak without dysarthria or aphasia but has problems with her memory and is unable to give a history of her symptoms.    She was evaluated in the ED, and the EDP spoke with her Daughter and patients symptoms of worsening of her right sided weakness and increased confusion began  yesterday.    A Ct scan of the Head was performed and  Revealed a new small left occipatal infarct.    She was referred for further workup and Neurology was consulted.   Review of Systems: Unable to Obtain from the Patient   Past Medical History  Diagnosis Date  . GLUCOSE INTOLERANCE 02/11/2007  . HYPERLIPIDEMIA 02/11/2007  . BLINDNESS, [place], [place] DEFINITION 11/25/2006  . Other specified forms of hearing loss 09/05/2009  . HYPERTENSION 02/11/2007  . Atrial fibrillation ([place]) 03/07/2009  . GERD 02/11/2007  . LOW BACK PAIN, CHRONIC 11/25/2006  . BACK PAIN 12/10/2007  . Pain in Soft Tissues of Limb 02/11/2007  . OSTEOPENIA 02/11/2007  . FATIGUE 12/10/2007  . GAIT DISTURBANCE 12/10/2007  . DYSPNEA 03/18/2009  . Cough 03/07/2009  . CHEST PAIN 12/10/2007  . Abdominal pain, left lower quadrant 04/01/2007  . BREAST CANCER, HX OF 11/25/2006  . Nocturia 05/18/2010  . Hypersomnia 05/18/2010  . Impaired glucose tolerance 05/18/2010  . Allergic rhinitis, cause unspecified 07/23/2012  . Anxiety state, unspecified 02/04/2013  . Cancer [facility])      Past Surgical History  Procedure Laterality Date  . Dilation  and curettage of uterus    . S/p mastectomy    . Appendectomy        Prior to Admission medications   Medication Sig Start Date End Date Taking? Authorizing Provider  apixaban (ELIQUIS) 5 MG TABS tablet Take 1 tablet (5 mg total) by mouth 2 (two) times daily. Patient taking differently: Take 5 mg by mouth daily.  03/28/15  Yes Daniel J Angiulli, PA-C  Diclofenac Sodium 1.5 % SOLN Place 2 mLs onto the skin 3 (three) times daily. 04/05/15  Yes Meredith Staggers, MD  diltiazem (CARDIZEM CD) 240 MG 24 hr capsule Take 1 capsule (240 mg total) by mouth daily. 03/28/15  Yes Daniel J Angiulli, PA-C  exemestane (AROMASIN) 25 MG tablet Take 1 tablet (25 mg total) by mouth daily. 03/28/15  Yes Daniel J Angiulli, PA-C  megestrol (MEGACE) 400 MG/10ML suspension Take 10 mLs (400 mg total) by mouth daily. 04/05/15  Yes Meredith Staggers, MD  metoprolol tartrate (LOPRESSOR) 25 MG tablet Take 0.5 tablets (12.5 mg total) by mouth 2 (two) times daily. 03/28/15  Yes Daniel J Angiulli, PA-C  oxyCODONE-acetaminophen (PERCOCET) 7.5-325 MG tablet Take 1 tablet by mouth every 8 (eight) hours as needed. 04/05/15  Yes Meredith Staggers, MD  pravastatin (PRAVACHOL) 40 MG tablet Take 1 tablet (40 mg total) by mouth daily at 6 PM. 03/28/15  Yes Lavon Paganini Angiulli, PA-C     Allergies  Allergen Reactions  . Trazodone And Nefazodone Other (  See Comments)    hallucinations    Social History:  reports that she has quit smoking. She has never used smokeless tobacco. She reports that she does not drink alcohol or use illicit drugs.    Family History  Problem Relation Age of Onset  . Hypertension Brother   . Lung cancer Brother   . Colon cancer Father   . COPD Brother   . Stroke Mother        Physical Exam:  GEN:  Tearful and Confused Obese Elderly 82 y.o. Caucasian female examined and in no acute distress; cooperative with exam Filed Vitals:   04/11/15 2100 04/11/15 2123 04/11/15 2130 04/11/15 2200  BP: 112/76  112/88 137/70    Pulse: 63  80 70  Temp:  97.4 F (36.3 C)    Resp: 20  15 21   SpO2: 100%  98% 97%   Blood pressure 137/70, pulse 70, temperature 97.4 F (36.3 C), resp. rate 21, SpO2 97 %. PSYCH: She is alert and oriented x 2;  HEENT: Normocephalic and Atraumatic, Mucous membranes pink; PERRLA; EOM intact; Fundi:  Benign;  No scleral icterus, Nares: Patent, Oropharynx: Clear, Sparse Poor Dentition,    Neck:  FROM, No Cervical Lymphadenopathy nor Thyromegaly or Carotid Bruit; No JVD; Breasts:: Not examined CHEST WALL: No tenderness CHEST: Normal respiration, clear to auscultation bilaterally HEART: Regular rate and rhythm; no murmurs rubs or gallops BACK: No kyphosis or scoliosis; No CVA tenderness ABDOMEN: Positive Bowel Sounds, Obese, Soft Non-Tender, No Rebound or Guarding; No Masses, No Organomegaly, No Pannus; No Intertriginous candida. Rectal Exam: Not done EXTREMITIES: No Cyanosis, Clubbing, or Edema; No Ulcerations. Genitalia: not examined PULSES: 2+ and symmetric SKIN: Normal hydration no rash or ulceration  CNS:  Alert and Oriented x 2, Mental Status:  Alert, Oriented, Thought Content Appropriate. Speech Fluent without evidence of Aphasia. In No obvious pain.   Cranial Nerves:  II: Discs flat bilaterally; Visual fields Intact, Legal Blindness (Chronic  And Unchanged). Pupils equal and reactive.    III,IV, VI: Extra-ocular motions intact bilaterally + Strabismus of Left eye   V,VII: smile symmetric, facial light touch sensation normal bilaterally    VIII: hearing decreasesd bilaterally ( Chronic)   IX,X: gag reflex present    XI: bilateral shoulder shrug    XII: midline tongue extension   Motor:  Right:  Upper extremity 4/5     Left:  Upper extremity 5/5     Right:  Lower extremity 4/5    Left:  Lower extremity 5/5     Tone and Bulk:  normal tone throughout; no atrophy noted   Sensory:  Pinprick and light touch intact throughout, bilaterally   Deep Tendon Reflexes: 2+ and symmetric  throughout   Plantars/ Babinski:  Right:  Up going        Left: normal    Cerebellar:  Finger to nose with difficulty.   Gait: deferred    Vascular: pulses palpable throughout    Labs on Admission:  Basic Metabolic Panel:  Recent Labs Lab 04/11/15 1817 04/11/15 1821  NA 142 141  K 4.1 4.4  CL 109 111  CO2  --  17*  GLUCOSE 137* 140*  BUN 25* 22*  CREATININE 1.40* 1.60*  CALCIUM  --  9.5   Liver Function Tests:  Recent Labs Lab 04/11/15 1821  AST 44*  ALT 24  ALKPHOS 72  BILITOT 1.2  PROT 7.7  ALBUMIN 3.7   No results for input(s): LIPASE, AMYLASE in the last  168 hours. No results for input(s): AMMONIA in the last 168 hours. CBC:  Recent Labs Lab 04/11/15 1817 04/11/15 1821  WBC  --  11.1*  NEUTROABS  --  7.1  HGB 17.7* 16.0*  HCT 52.0* 47.3*  MCV  --  92.4  PLT  --  251   Cardiac Enzymes: No results for input(s): CKTOTAL, CKMB, CKMBINDEX, TROPONINI in the last 168 hours.  BNP (last 3 results) No results for input(s): BNP in the last 8760 hours.  ProBNP (last 3 results) No results for input(s): PROBNP in the last 8760 hours.  CBG: No results for input(s): GLUCAP in the last 168 hours.  Radiological Exams on Admission: Ct Head Wo Contrast  04/11/2015  CLINICAL DATA:  Right-sided weakness. Diagnosis with CVA last week. Physical therapist noticed worsening right-sided weakness today. EXAM: CT HEAD WITHOUT CONTRAST TECHNIQUE: Contiguous axial images were obtained from the base of the skull through the vertex without intravenous contrast. COMPARISON:  Head CT dated 03/24/2015. FINDINGS: There is generalized brain atrophy with commensurate dilatation of the ventricles and sulci. Chronic small vessel ischemic changes again noted within the bilateral periventricular and subcortical white matter regions. Small old lacunar infarcts noted within each basal ganglia region. These findings appear stable compared to the previous head CT. There is a new small  low-density focus within the lower left occipital lobe, most suggestive of subacute infarct, possibly chronic but new compared to the most recent head CT of 03/24/2015. No associated mass effect or midline shift. No parenchymal or extra-axial hemorrhage. Ventricles are stable in size and configuration. No osseous abnormality.  Superficial soft tissues are unremarkable. IMPRESSION: 1. New small low-density focus within the lower left occipital lobe, most suggestive of a small infarct of subacute or chronic age. Favor subacute infarct, possibly chronic but new compared to the most recent head CT of 03/24/2015. This could be confirmed with MRI if clinically needed. 2. Additional chronic ischemic changes, as detailed above. 3. No acute-appearing findings.  No intracranial hemorrhage. Electronically Signed   By: Franki Cabot M.D.   On: 04/11/2015 21:07     Assessment/Plan:      82 y.o. female with  Principal Problem:    CVA (cerebral infarction)    CVA workup    Neurology consulted    MRI/MRA ordered    Neuro Checks    ASA Rx    PT/OT/Speech evals in AM     Active Problems:    AKI (acute kidney injury) ([place])    Gentle IVFs    Monitor BUN/Cr      UTI (lower urinary tract infection)    Urine C+S sent    IV Rocephin      Atrial fibrillation (HCC)    Continue Diltiazem, Metorprolol  Rx    Continue Eliquis Rx      Chronic anticoagulation    On Eliquis Rx      Essential hypertension    Continue Diltiazem, and Metoprolol Rx    Monitor BPs        DM (diabetes mellitus) ([place])          BREAST CANCER, HX OF    On Aromasin      DVT Prophylaxis    Lovenox      Code Status:     FULL CODE        Family Communication:   No Family Present, (Unable to Reach Daughter Audiological scientist by Telephone) Disposition Plan:    Inpatient  Status wiith Expected LOS 1-3  days      Time spent: [facility] Pager [phone]   If [place] Please Contact  the Day Rounding Team MD for [facility]  If 7PM-7AM, Please Contact Night-Floor Coverage  [email or web address] Password [email or web address] 04/12/2015, 12:12 AM     ADDENDUM:   Patient was seen and examined on 04/12/2015

## 2015-04-12 NOTE — Progress Notes (Signed)
Rose Sullivan is a 82 y.o. female patient admitted from ED awake, alert - oriented  X 4 - no acute distress noted.  VSS - Blood pressure 113/61, pulse 58, temperature 97.4 F (36.3 C), temperature source Oral, resp. rate 20, SpO2 97 %.    IV in place, occlusive dsg intact without redness.  Orientation to room, and floor completed with information packet given to patient/family.  Patient declined safety video at this time.  Admission INP armband ID verified with patient/family, and in place.   SR up x 2, fall assessment complete, with patient and family able to verbalize understanding of risk associated with falls, and verbalized understanding to call nsg before up out of bed.  Call light within reach, patient able to voice, and demonstrate understanding.  Skin, clean-dry- intact without evidence of bruising, or skin tears.   No evidence of skin break down noted on exam.     Will cont to eval and treat per MD orders.  Teryl Lucy, RN 04/12/2015 3:25 AM

## 2015-04-12 NOTE — Evaluation (Signed)
Clinical/Bedside Swallow Evaluation Patient Details  Name: Rose Sullivan MRN: [id number] Date of Birth: 10/06/1933  Today's Date: 04/12/2015 Time: SLP Start Time (ACUTE ONLY): 0913 SLP Stop Time (ACUTE ONLY): 0932 SLP Time Calculation (min) (ACUTE ONLY): 19 min  Past Medical History:  Past Medical History  Diagnosis Date  . GLUCOSE INTOLERANCE 02/11/2007  . HYPERLIPIDEMIA 02/11/2007  . BLINDNESS, [place], [place] DEFINITION 11/25/2006  . Other specified forms of hearing loss 09/05/2009  . HYPERTENSION 02/11/2007  . Atrial fibrillation ([place]) 03/07/2009  . GERD 02/11/2007  . LOW BACK PAIN, CHRONIC 11/25/2006  . BACK PAIN 12/10/2007  . Pain in Soft Tissues of Limb 02/11/2007  . OSTEOPENIA 02/11/2007  . FATIGUE 12/10/2007  . GAIT DISTURBANCE 12/10/2007  . DYSPNEA 03/18/2009  . Cough 03/07/2009  . CHEST PAIN 12/10/2007  . Abdominal pain, left lower quadrant 04/01/2007  . BREAST CANCER, HX OF 11/25/2006  . Nocturia 05/18/2010  . Hypersomnia 05/18/2010  . Impaired glucose tolerance 05/18/2010  . Allergic rhinitis, cause unspecified 07/23/2012  . Anxiety state, unspecified 02/04/2013  . Cancer [facility])    Past Surgical History:  Past Surgical History  Procedure Laterality Date  . Dilation and curettage of uterus    . S/p mastectomy    . Appendectomy     HPI:  Rose Sullivan is a 82 y.o. female admitted with worsened right sided weakness, and Confusion. PMH:  Atrial Fibrillation, HTN and previous CVA with right sided Hemiparesis (03/16/2015). MRI Acute LEFT posterior cerebral artery territory infarcts (LEFT occipital lobe, LEFT thalamus).   Assessment / Plan / Recommendation Clinical Impression  Suspect pharyngeal residue evidenced by multiple swallows, subtle cue of watery eyes. present. Pt would benefit from objective assessment (MBS) given new and remote CVA, clinical observations and daughter report (per chart) and pt's report of coughing with liquids intermittently. Initiate Dys 2 and nectar  liquids, MBS next date.     Aspiration Risk  Moderate aspiration risk    Diet Recommendation Dysphagia 2 (Fine chop);Nectar-thick liquid   Liquid Administration via: Cup;Straw Medication Administration: Whole meds with puree Supervision: Full supervision/cueing for compensatory strategies;Staff to assist with self feeding Compensations: Small sips/bites;Slow rate Postural Changes: Seated upright at 90 degrees    Other  Recommendations Oral Care Recommendations: Oral care BID   Follow up Recommendations  None    Frequency and Duration min 2x/week  2 weeks       Prognosis Prognosis for Safe Diet Advancement: Good      Swallow Study   General HPI: Rose Sullivan is a 82 y.o. female admitted with worsened right sided weakness, and Confusion. PMH:  Atrial Fibrillation, HTN and previous CVA with right sided Hemiparesis (03/16/2015). MRI Acute LEFT posterior cerebral artery territory infarcts (LEFT occipital lobe, LEFT thalamus). Type of Study: Bedside Swallow Evaluation Previous Swallow Assessment:  (none) Diet Prior to this Study: NPO Temperature Spikes Noted: No Respiratory Status: Room air History of Recent Intubation: No Behavior/Cognition: Alert;Cooperative;Pleasant mood (HOH) Oral Cavity Assessment: Dry Oral Care Completed by SLP: Yes Oral Cavity - Dentition: Missing dentition;Poor condition Vision: Functional for self-feeding Self-Feeding Abilities: Able to feed self;Needs set up;Needs assist Patient Positioning: Upright in bed Baseline Vocal Quality: Normal Volitional Cough: Strong Volitional Swallow: Able to elicit    Oral/Motor/Sensory Function Overall Oral Motor/Sensory Function: Within functional limits   Ice Chips Ice chips: Not tested   Thin Liquid Thin Liquid: Impaired Presentation: Cup;Straw Oral Phase Impairments:  (none) Pharyngeal  Phase Impairments: Multiple swallows;Suspected delayed Swallow  Nectar Thick Nectar Thick Liquid: Not tested    Honey Thick Honey Thick Liquid: Not tested   Puree Puree: Impaired Oral Phase Impairments:  (none) Pharyngeal Phase Impairments: Multiple swallows   Solid   GO   Solid: Impaired Oral Phase Functional Implications: Prolonged oral transit        Houston Siren 04/12/2015,9:46 AM  Orbie Pyo Colvin Caroli.Ed [employer] [phone]

## 2015-04-13 ENCOUNTER — Inpatient Hospital Stay (HOSPITAL_COMMUNITY): Payer: Medicare Other

## 2015-04-13 DIAGNOSIS — E1159 Type 2 diabetes mellitus with other circulatory complications: Secondary | ICD-10-CM

## 2015-04-13 LAB — URINE CULTURE

## 2015-04-13 MED ORDER — OXYCODONE-ACETAMINOPHEN 7.5-325 MG PO TABS
1.0000 | ORAL_TABLET | Freq: Three times a day (TID) | ORAL | Status: DC | PRN
  Administered 2015-04-13 – 2015-04-15 (×4): 1 via ORAL
  Filled 2015-04-13 (×4): qty 1

## 2015-04-13 MED ORDER — ACETAMINOPHEN 325 MG PO TABS
650.0000 mg | ORAL_TABLET | Freq: Four times a day (QID) | ORAL | Status: DC | PRN
  Administered 2015-04-13: 650 mg via ORAL
  Filled 2015-04-13 (×2): qty 2

## 2015-04-13 NOTE — Evaluation (Addendum)
Occupational Therapy Evaluation Patient Details Name: Rose Sullivan MRN: [id number] DOB: May 25, 1933 Today's Date: 04/13/2015    History of Present Illness  Daughter brought pt to ER after noticing increased right sided weakness and increased confusion. Pt was admitted with extension of CVA from 03/2015. MRI reveals acute left PCA, UTI and dehydration. PMH includes: Legally blind (since birth per pt report), DM. HTN, a-fib.   Clinical Impression   Pt admitted as above and seen for OT assessment currently demonstrating deficits in her ability to perform ADL's and functional transfers (see OT problem list below). Recommend acute OT to assist w/ maximizing independence with ADL and selfcare tsaks for anticipated d/c home w/ family assist and decreased burden of care.    Follow Up Recommendations  Home health OT;Supervision/Assistance - 24 hour    Equipment Recommendations  Other (comment) (Cont to assess in functional context, pt has DME)    Recommendations for Other Services       Precautions / Restrictions Precautions Precautions: Fall Restrictions Weight Bearing Restrictions: No      Mobility Bed Mobility Overal bed mobility: Needs Assistance Bed Mobility: Sidelying to Sit   Sidelying to sit: Mod assist          Transfers Overall transfer level: Needs assistance Equipment used: Rolling walker (2 wheeled) Transfers: Sit to/from [employer] Transfers Sit to Stand: From elevated surface;Mod assist Stand pivot transfers: Min assist;+2 physical assistance       General transfer comment: VC's/TC's for safety, sequencing and hand placement during transfers. Take into consideration that pt is legally blind and this may impact efficiency of transfers.     Balance Overall balance assessment: Needs assistance Sitting-balance support: Bilateral upper extremity supported;Feet supported Sitting balance-Leahy Scale: Fair     Standing balance support: Bilateral upper  extremity supported Standing balance-Leahy Scale: Poor                              ADL Overall ADL's : Needs assistance/impaired     Grooming: Wash/dry hands;Wash/dry face;Brushing hair;Minimal assistance;Sitting Grooming Details (indicate cue type and reason): Difficulty secondary to legally blind. Benefits tc's as well as vc's. Bilateral UE's w/ tremors during functional activity noted Upper Body Bathing: Minimal assitance;Sitting   Lower Body Bathing: Sitting/lateral leans;Moderate assistance;Sit to/from stand;Cueing for safety;Cueing for sequencing   Upper Body Dressing : Minimal assistance;Sitting;Cueing for sequencing   Lower Body Dressing: Moderate assistance;Sitting/lateral leans;Sit to/from stand;Cueing for safety;Cueing for sequencing   Toilet Transfer: Moderate assistance;Stand-pivot;BSC;RW;Cueing for sequencing;Cueing for safety (Simulated transfer from EOB to recliner in room today using RW)   Toileting- Clothing Manipulation and Hygiene: Moderate assistance;Sit to/from stand;Cueing for safety;Cueing for sequencing       Functional mobility during ADLs: Moderate assistance;Rolling walker;Cueing for safety;Cueing for sequencing General ADL Comments: Pt presents with extension of recent stroke, UTI and dehydration. Pt daughter plans to have pt return home with HHOT/PT and is available 24/7 for PRN assistance. Pt participated in ADL session today seated in recliner after simulated toilet transfer from EOB to chair. Pt/ daughter are agreeable to plan of care to assist in maximizing independence with ADL and selfcare tsaks for anticipated d/c home and decreased burden of care.     Vision Additional Comments: Pt has glasses but does not wear at all times. Pt is legally blind. Left eye turns out to far left at rest, no double vision reported. Pt able to track with eyes but left eye doesn't track  full medially.   Perception     Praxis      Pertinent Vitals/Pain  Pain Assessment: 0-10 Pain Score: 5  Pain Location: R hip/knee with movement (chronic arthritic pain per daughter) Pain Descriptors / Indicators: Aching Pain Intervention(s): Limited activity within patient's tolerance;Monitored during session;Patient requesting pain meds-RN notified;Repositioned     Hand Dominance Right   Extremity/Trunk Assessment Upper Extremity Assessment Upper Extremity Assessment: Generalized weakness (Bilateral tremors noted, decreased AROM at end range and generalized weakness, tremors during functional grooming tasks noted.)   Lower Extremity Assessment Lower Extremity Assessment: RLE deficits/detail;LLE deficits/detail RLE Deficits / Details: knee extension AROM -25*, ankle PF/DF 4/5, hip AAROM limited 50% by pain, sensation intact to light touch LLE Deficits / Details: knee ext AROM -15*, ankle 5/5   Cervical / Trunk Assessment Cervical / Trunk Assessment: Normal   Communication     Cognition Arousal/Alertness: Awake/alert Behavior During Therapy: WFL for tasks assessed/performed Overall Cognitive Status: History of cognitive impairments - at baseline                     General Comments       Exercises       Shoulder Instructions      Home Living Family/patient expects to be discharged to:: Private residence Living Arrangements: Children Available Help at Discharge: Family;Available 24 hours/day Type of Home: House Home Access: Ramped entrance     Home Layout: One level     Bathroom Shower/Tub: Tub/shower unit         Home Equipment: Wheelchair - [employer] - 2 wheels      Lives With: Family;Daughter    Prior Functioning/Environment Level of Independence: Needs assistance  Gait / Transfers Assistance Needed: min/guard with RW for ambulation          OT Diagnosis: Generalized weakness;Altered mental status;Blindness and low vision   OT Problem List: Decreased strength;Decreased range of motion;Decreased activity  tolerance;Impaired balance (sitting and/or standing);Decreased cognition;Impaired UE functional use;Decreased coordination;Decreased knowledge of use of DME or AE   OT Treatment/Interventions: Self-care/ADL training;DME and/or AE instruction;Patient/family education;Neuromuscular education;Therapeutic activities;Therapeutic exercise    OT Goals(Current goals can be found in the care plan section) Acute Rehab OT Goals Patient Stated Goal: to go home, be able to get in/out of bed easier Time For Goal Achievement: 04/27/15 Potential to Achieve Goals: Good ADL Goals Pt Will Perform Grooming: sitting;with set-up;with caregiver independent in assisting Pt Will Perform Lower Body Bathing: with min assist;with caregiver independent in assisting;sitting/lateral leans;sit to/from stand Pt Will Perform Lower Body Dressing: with min assist;with caregiver independent in assisting;sitting/lateral leans;sit to/from stand Pt Will Transfer to Toilet: with min assist;ambulating;bedside commode Pt Will Perform Toileting - Clothing Manipulation and hygiene: with min assist;with caregiver independent in assisting;sitting/lateral leans;sit to/from stand  OT Frequency: Min 2X/week   Barriers to D/C:            Co-evaluation   Reason for Co-Treatment: Complexity of the patient's impairments (multi-system involvement);For patient/therapist safety PT goals addressed during session: Mobility/safety with mobility        End of Session Equipment Utilized During Treatment: Gait belt;Rolling walker  Activity Tolerance: Patient tolerated treatment well Patient left: in chair;with call bell/phone within reach;with chair alarm set;with family/visitor present   Time: [id number] OT Time Calculation (min): 24 min Charges:  OT General Charges $OT Visit: 1 Procedure OT Evaluation $OT Eval Moderate Complexity: 1 Procedure OT Treatments $Self Care/Home Management : 8-22 mins G-Codes:    [place], [employer],  OTR/L 04/13/2015, 11:21 AM

## 2015-04-13 NOTE — Progress Notes (Signed)
[facility] PROGRESS NOTE   Rose Sullivan [id number] DOB: 1933/08/04 DOA: 04/11/2015 PCP: [name], MD  HPI/Subjective: Denies any complaints, seen with her daughter at bedside. I had prolonged discussion with her daughter and answered all of her questions.  Assessment/Plan: Principal Problem:   CVA (cerebral infarction) Active Problems:   Essential hypertension   Atrial fibrillation (HCC)   BREAST CANCER, HX OF   Chronic anticoagulation   DM (diabetes mellitus) (HCC)   AKI (acute kidney injury) ([place])   UTI (lower urinary tract infection)   Acute UTI   History of recent stroke   Right sided weakness    Acute CVA Patient has recent CVA and discharged home on Eliquis 5 mg twice a day. Patient came in with symptoms of confusion and right-sided weakness, MRI showed extension of the previous stroke. Per neurology this could be secondary to low blood pressure from UTI. Daughter told me patient was on Eliquis 5 mg once a day only since discharge, she was receiving Eliquis samples from her PCP till yesterday when she received the Eliquis from pharmacy then she knew it should be twice a day.  UTI Urinalysis consistent with UTI, started on Rocephin. Continue current antibiotic, just according to the culture results.  Acute kidney injury Baseline creatinine is 1.23 from 03/24/2015, presented with creatinine of 1.6. Improving with IV fluid hydration, creatinine is 1.4 today.  Atrial fibrillation Her rate is controlled, continue current home medications. On Eliquis, 5 mg twice a day, per daughter she was on once a day at home. CHA2DS2-VASc of at least 7 for HTN, DM and being a female and 2 points for both age and history of stroke.  Essential hypertension Continue home medications.  Diabetes mellitus type 2 Carbohydrate modified diet and insulin sliding scale, hemoglobin A1c consistently below 6.5 since 2014.  History of breast cancer On Aromasin  Code  Status: Full Code Family Communication: Plan discussed with the patient. Disposition Plan: Remains inpatient Diet: DIET DYS 2 Room service appropriate?: Yes; Fluid consistency:: Nectar Thick  Consultants:  Stroke  Procedures:  None  Antibiotics:  Rocephin (indicate start date, and stop date if known)   Objective: Filed Vitals:   04/13/15 0202 04/13/15 0625  BP: 173/71 156/76  Pulse: 75 66  Temp:  97.8 F (36.6 C)  Resp:  18    Intake/Output Summary (Last 24 hours) at 04/13/15 1311 Last data filed at 04/13/15 0500  Gross per 24 hour  Intake 2037.5 ml  Output    100 ml  Net 1937.5 ml   There were no vitals filed for this visit.  Exam: General: Alert and awake, oriented x3, not in any acute distress. HEENT: anicteric sclera, pupils reactive to light and accommodation, EOMI CVS: S1-S2 clear, no murmur rubs or gallops Chest: clear to auscultation bilaterally, no wheezing, rales or rhonchi Abdomen: soft nontender, nondistended, normal bowel sounds, no organomegaly Extremities: no cyanosis, clubbing or edema noted bilaterally Neuro: Cranial nerves II-XII intact, no focal neurological deficits  Data Reviewed: Basic Metabolic Panel:  Recent Labs Lab 04/11/15 1817 04/11/15 1821 04/12/15 0939  NA 142 141 143  K 4.1 4.4 3.6  CL 109 111 113*  CO2  --  17* 19*  GLUCOSE 137* 140* 102*  BUN 25* 22* 23*  CREATININE 1.40* 1.60* 1.41*  CALCIUM  --  9.5 8.7*  MG  --   --  2.0   Liver Function Tests:  Recent Labs Lab 04/11/15 1821 04/12/15 0939  AST 44* 32  ALT 24 21  ALKPHOS 72 63  BILITOT 1.2 1.2  PROT 7.7 6.9  ALBUMIN 3.7 3.5   No results for input(s): LIPASE, AMYLASE in the last 168 hours. No results for input(s): AMMONIA in the last 168 hours. CBC:  Recent Labs Lab 04/11/15 1817 04/11/15 1821 04/12/15 0939  WBC  --  11.1* 8.3  NEUTROABS  --  7.1 5.6  HGB 17.7* 16.0* 13.5  HCT 52.0* 47.3* 41.0  MCV  --  92.4 92.6  PLT  --  251 201   Cardiac  Enzymes: No results for input(s): CKTOTAL, CKMB, CKMBINDEX, TROPONINI in the last 168 hours. BNP (last 3 results) No results for input(s): BNP in the last 8760 hours.  ProBNP (last 3 results) No results for input(s): PROBNP in the last 8760 hours.  CBG: No results for input(s): GLUCAP in the last 168 hours.  Micro Recent Results (from the past 240 hour(s))  Urine culture     Status: None   Collection Time: 04/11/15  9:12 PM  Result Value Ref Range Status   Specimen Description URINE, RANDOM  Final   Special Requests NONE  Final   Culture MULTIPLE SPECIES PRESENT, SUGGEST RECOLLECTION  Final   Report Status 04/13/2015 FINAL  Final     Studies: Ct Head Wo Contrast  04/11/2015  CLINICAL DATA:  Right-sided weakness. Diagnosis with CVA last week. Physical therapist noticed worsening right-sided weakness today. EXAM: CT HEAD WITHOUT CONTRAST TECHNIQUE: Contiguous axial images were obtained from the base of the skull through the vertex without intravenous contrast. COMPARISON:  Head CT dated 03/24/2015. FINDINGS: There is generalized brain atrophy with commensurate dilatation of the ventricles and sulci. Chronic small vessel ischemic changes again noted within the bilateral periventricular and subcortical white matter regions. Small old lacunar infarcts noted within each basal ganglia region. These findings appear stable compared to the previous head CT. There is a new small low-density focus within the lower left occipital lobe, most suggestive of subacute infarct, possibly chronic but new compared to the most recent head CT of 03/24/2015. No associated mass effect or midline shift. No parenchymal or extra-axial hemorrhage. Ventricles are stable in size and configuration. No osseous abnormality.  Superficial soft tissues are unremarkable. IMPRESSION: 1. New small low-density focus within the lower left occipital lobe, most suggestive of a small infarct of subacute or chronic age. Favor subacute  infarct, possibly chronic but new compared to the most recent head CT of 03/24/2015. This could be confirmed with MRI if clinically needed. 2. Additional chronic ischemic changes, as detailed above. 3. No acute-appearing findings.  No intracranial hemorrhage. Electronically Signed   By: Franki Cabot M.D.   On: 04/11/2015 21:07   Mr Brain Wo Contrast  04/12/2015  CLINICAL DATA:  Worsening RIGHT-sided weakness and confusion for 1 day. Recent stroke resulting in RIGHT-sided hemi paresis. History of atrial fibrillation on Eliquis. History of hypertension, breast cancer, hyperlipidemia. EXAM: MRI HEAD WITHOUT CONTRAST MRA HEAD WITHOUT CONTRAST TECHNIQUE: Multiplanar, multiecho pulse sequences of the brain and surrounding structures were obtained without intravenous contrast. Angiographic images of the head were obtained using MRA technique without contrast. COMPARISON:  CT head April 11, 2015 and MRI of the brain March 13, 2015 and CT angiogram of the head March 14, 2015 FINDINGS: MRI HEAD FINDINGS Moderately motion degraded axial T2 sequence. Patchy reduced diffusion LEFT posterior temporal lobe, LEFT mesial occipital lobe. Subcentimeter focus of reduced diffusion LEFT thalamus, corresponding areas demonstrate low ADC value, NR in similar distribution to  recent strokes. No susceptibility artifact to suggest hemorrhage. Moderate to severe ventriculomegaly on the basis of global parenchymal brain volume loss, stable from prior MRI. Confluent supratentorial white matter T2 hyperintensities. No abnormal extra-axial fluid collections. No abnormal sellar expansion. No cerebellar tonsillar ectopia. Status post bilateral ocular lens implants. Small RIGHT maxillary mucosal retention cyst without paranasal sinus air-fluid levels. Mastoid air cells appear well-aerated. No suspicious calvarial bone marrow signal. MRA HEAD FINDINGS Moderately motion degraded examination. Anterior circulation: Normal flow related  enhancement of the included cervical, petrous, cavernous and supraclinoid internal carotid arteries. Normal flow related enhancement of the anterior and middle cerebral arteries, including distal segments. Posterior circulation: LEFT vertebral artery appears dominant. Flow related enhancement of the basilar artery. Occluded LEFT posterior cerebral artery at proximal LEFT P1 segment. Thready stenotic RIGHT posterior cerebral artery, was characterized on prior CTA head. Due to motion, limited assessment for luminal irregularity and aneurysm. IMPRESSION: MRI HEAD: Acute LEFT posterior cerebral artery territory infarcts (LEFT occipital lobe, LEFT thalamus). No hemorrhagic conversion. Please note, this is in similar vascular territory to recent infarcts. Severe chronic small vessel ischemic disease. Stable moderate to severe global parenchymal brain volume loss. MRA HEAD: Moderately motion degraded examination. Chronically occluded LEFT posterior cerebral artery. Stenotic RIGHT posterior cerebral artery better characterized on prior CTA head. Electronically Signed   By: Elon Alas M.D.   On: 04/12/2015 05:44   Mr Jodene Nam Head/brain Wo Cm  04/12/2015  CLINICAL DATA:  Worsening RIGHT-sided weakness and confusion for 1 day. Recent stroke resulting in RIGHT-sided hemi paresis. History of atrial fibrillation on Eliquis. History of hypertension, breast cancer, hyperlipidemia. EXAM: MRI HEAD WITHOUT CONTRAST MRA HEAD WITHOUT CONTRAST TECHNIQUE: Multiplanar, multiecho pulse sequences of the brain and surrounding structures were obtained without intravenous contrast. Angiographic images of the head were obtained using MRA technique without contrast. COMPARISON:  CT head April 11, 2015 and MRI of the brain March 13, 2015 and CT angiogram of the head March 14, 2015 FINDINGS: MRI HEAD FINDINGS Moderately motion degraded axial T2 sequence. Patchy reduced diffusion LEFT posterior temporal lobe, LEFT mesial occipital lobe.  Subcentimeter focus of reduced diffusion LEFT thalamus, corresponding areas demonstrate low ADC value, NR in similar distribution to recent strokes. No susceptibility artifact to suggest hemorrhage. Moderate to severe ventriculomegaly on the basis of global parenchymal brain volume loss, stable from prior MRI. Confluent supratentorial white matter T2 hyperintensities. No abnormal extra-axial fluid collections. No abnormal sellar expansion. No cerebellar tonsillar ectopia. Status post bilateral ocular lens implants. Small RIGHT maxillary mucosal retention cyst without paranasal sinus air-fluid levels. Mastoid air cells appear well-aerated. No suspicious calvarial bone marrow signal. MRA HEAD FINDINGS Moderately motion degraded examination. Anterior circulation: Normal flow related enhancement of the included cervical, petrous, cavernous and supraclinoid internal carotid arteries. Normal flow related enhancement of the anterior and middle cerebral arteries, including distal segments. Posterior circulation: LEFT vertebral artery appears dominant. Flow related enhancement of the basilar artery. Occluded LEFT posterior cerebral artery at proximal LEFT P1 segment. Thready stenotic RIGHT posterior cerebral artery, was characterized on prior CTA head. Due to motion, limited assessment for luminal irregularity and aneurysm. IMPRESSION: MRI HEAD: Acute LEFT posterior cerebral artery territory infarcts (LEFT occipital lobe, LEFT thalamus). No hemorrhagic conversion. Please note, this is in similar vascular territory to recent infarcts. Severe chronic small vessel ischemic disease. Stable moderate to severe global parenchymal brain volume loss. MRA HEAD: Moderately motion degraded examination. Chronically occluded LEFT posterior cerebral artery. Stenotic RIGHT posterior cerebral artery better characterized on prior  CTA head. Electronically Signed   By: Elon Alas M.D.   On: 04/12/2015 05:44    Scheduled Meds: .  apixaban  5 mg Oral BID  . aspirin  300 mg Rectal Daily   Or  . aspirin  325 mg Oral Daily  . cefTRIAXone (ROCEPHIN)  IV  1 g Intravenous Q24H  . diltiazem  240 mg Oral Daily  . exemestane  25 mg Oral Daily  . metoprolol tartrate  12.5 mg Oral BID  . pravastatin  40 mg Oral q1800   Continuous Infusions:      Time spent: 35 minutes    [employer] A  [facility] Pager [phone] If 7PM-7AM, please contact night-coverage at [email or web address], password [facility] 04/13/2015, 1:11 PM  LOS: 1 day

## 2015-04-13 NOTE — Evaluation (Signed)
Physical Therapy Evaluation Patient Details Name: Rose Sullivan MRN: [id number] DOB: [birth year] Today's Date: 04/13/2015   History of Present Illness   Daughter brought pt to ER after noticing increased right sided weakness and increased confusion. Pt was admitted with extension of CVA from 03/2015. MRI reveals acute left PCA, UTI and dehydration. PMH includes: Legally blind (since birth per pt report), DM. HTN, a-fib.  Clinical Impression  Pt admitted with above diagnosis. Pt currently with functional limitations due to the deficits listed below (see PT Problem List). +2 assist for safety with stand pivot transfer, max A (pt 50%) for rolling and sidelying to sit.  Pt will benefit from skilled PT to increase their independence and safety with mobility to allow discharge to the venue listed below.       Follow Up Recommendations Home health PT;Supervision/Assistance - 24 hour    Equipment Recommendations  Other (comment) (sliding board- for trial, daughter reports bed at home is soft and need a firm surface for transfers)    Recommendations for Other Services OT consult     Precautions / Restrictions Precautions Precautions: Fall Restrictions Weight Bearing Restrictions: No      Mobility  Bed Mobility Overal bed mobility: Needs Assistance Bed Mobility: Sidelying to Sit   Sidelying to sit: Mod assist          Transfers Overall transfer level: Needs assistance Equipment used: Rolling walker (2 wheeled) Transfers: Sit to/from [employer] Transfers Sit to Stand: From elevated surface;Mod assist Stand pivot transfers: Min assist;+2 physical assistance       General transfer comment: VC's/TC's for safety, sequencing and hand placement during transfers. Take into consideration that pt is legally blind and this may impact efficiency of transfers.   Ambulation/Gait             General Gait Details: deferred 2* pt dizzy in standing  Stairs             Wheelchair Mobility    Modified Rankin (Stroke Patients Only)       Balance Overall balance assessment: Needs assistance Sitting-balance support: Bilateral upper extremity supported;Feet supported Sitting balance-Leahy Scale: Fair     Standing balance support: Bilateral upper extremity supported Standing balance-Leahy Scale: Poor                               Pertinent Vitals/Pain Pain Assessment: 0-10 Pain Score: 5  Pain Location: R hip/knee with movement (chronic arthritic pain per daughter) Pain Descriptors / Indicators: Aching Pain Intervention(s): Limited activity within patient's tolerance;Monitored during session;Patient requesting pain meds-RN notified;Repositioned    Home Living Family/patient expects to be discharged to:: Private residence Living Arrangements: Children Available Help at Discharge: Family;Available 24 hours/day Type of Home: House Home Access: Ramped entrance     Home Layout: One level Home Equipment: Wheelchair - [employer] - 2 wheels      Prior Function Level of Independence: Needs assistance   Gait / Transfers Assistance Needed: min/guard with RW for ambulation           Hand Dominance   Dominant Hand: Right    Extremity/Trunk Assessment   Upper Extremity Assessment: Generalized weakness (Bilateral tremors noted, decreased AROM at end range and generalized weakness, tremors during functional grooming tasks noted.)           Lower Extremity Assessment: RLE deficits/detail;LLE deficits/detail RLE Deficits / Details: knee extension AROM -25*, ankle PF/DF 4/5, hip AAROM limited 50% by  pain, sensation intact to light touch LLE Deficits / Details: knee ext AROM -15*, ankle 5/5  Cervical / Trunk Assessment: Normal  Communication      Cognition Arousal/Alertness: Awake/alert Behavior During Therapy: WFL for tasks assessed/performed Overall Cognitive Status: History of cognitive impairments - at baseline                       General Comments      Exercises        Assessment/Plan    PT Assessment Patient needs continued PT services  PT Diagnosis Hemiplegia dominant side;Difficulty walking;Generalized weakness   PT Problem List Decreased strength;Decreased range of motion;Decreased activity tolerance;Decreased balance;Pain;Decreased knowledge of use of DME;Decreased mobility  PT Treatment Interventions DME instruction;Gait training;Functional mobility training;Therapeutic activities;Patient/family education;Balance training;Therapeutic exercise   PT Goals (Current goals can be found in the Care Plan section) Acute Rehab PT Goals Patient Stated Goal: to go home, be able to get in/out of bed easier PT Goal Formulation: With patient/family Time For Goal Achievement: 04/27/15 Potential to Achieve Goals: Good    Frequency Min 3X/week   Barriers to discharge        Co-evaluation PT/OT/SLP Co-Evaluation/Treatment: Yes (for part of session) Reason for Co-Treatment: Complexity of the patient's impairments (multi-system involvement);For patient/therapist safety PT goals addressed during session: Mobility/safety with mobility         End of Session Equipment Utilized During Treatment: Gait belt Activity Tolerance: Patient limited by fatigue Patient left: in chair;with call bell/phone within reach;with chair alarm set;with family/visitor present Nurse Communication: Mobility status         Time: [id number] PT Time Calculation (min) (ACUTE ONLY): 30 min   Charges:   PT Evaluation $PT Eval Moderate Complexity: 1 Procedure PT Treatments $Therapeutic Activity: 8-22 mins   PT G Codes:        Philomena Doheny 04/13/2015, 11:15 AM [phone]

## 2015-04-13 NOTE — Progress Notes (Signed)
MBSS complete. Full report located under chart review in imaging section.  Denicia Pagliarulo Paiewonsky, M.A. CCC-SLP [phone]  

## 2015-04-14 NOTE — Progress Notes (Signed)
[facility]  Patient Status: Active (receiving services up to time of hospitalization)  [facility] is providing the following services: RN, PT, OT and HHA  If patient discharges after hours, please call [phone].   Rose Sullivan 04/14/2015, 5:49 PM

## 2015-04-14 NOTE — Progress Notes (Signed)
Speech Language Pathology Treatment: Dysphagia  Patient Details Name: Rose Sullivan MRN: [id number] DOB: 12-Jun-1933 Today's Date: 04/14/2015 Time: 1005-1020 SLP Time Calculation (min) (ACUTE ONLY): 15 min  Assessment / Plan / Recommendation Clinical Impression  Pt was seen for skilled ST targeting dysphagia goals.  Pt was seated upright in bed, awake, alert, pleasantly confused.  When completing oral care, pt noted with mild- moderate clear, thin oral secretions with intermittent wet vocal quality.  Strong reflexive cough noted on secretions prior to POs which effectively cleared vocal quality.  Pt remains afebrile and O2 sats WFL per chart review.  SLP facilitated the session with trials of thin liquids following oral care to continue working towards liquids advancement.  Pt demonstrated multiple audible swallows with thin liquids via cup and straw sips, suspected swallow delay, and intermittently wet vocal quality.  No significant changes noted in clinical presentation with mixed consistencies or with thickened liquids.  Pt also demonstrated intermittent oral holding which she was able to correct with min assist verbal cues.  Due to inconsistent and fluctuating presentation, recommend that pt continue on dys 2, nectar thick liquids for 1-2 more visits with ongoing ST follow up to monitor readiness for advancement.     HPI HPI: Rose Sullivan is a 82 y.o. female admitted with worsened right sided weakness, and Confusion. PMH:  Atrial Fibrillation, HTN and previous CVA with right sided Hemiparesis (03/16/2015). MRI Acute LEFT posterior cerebral artery territory infarcts (LEFT occipital lobe, LEFT thalamus).      SLP Plan  Continue with current plan of care     Recommendations  Diet recommendations: Dysphagia 2 (fine chop);Nectar-thick liquid Liquids provided via: Cup;Straw Medication Administration: Whole meds with puree Supervision: Full supervision/cueing for compensatory  strategies Compensations: Small sips/bites;Slow rate;Clear throat intermittently             Follow up Recommendations: Home health SLP;24 hour supervision/assistance Plan: Continue with current plan of care     GO                PageSelinda Sullivan 04/14/2015, 10:29 AM

## 2015-04-14 NOTE — Plan of Care (Signed)
Noted by our Stroke Quality Coordinator that pt was taking eliquis daily PTA which is under dosing. Currently she is on 5mg  bid which is the correct dosing. In addition, she was on ASA full dose on top of eliquis. I do not think she has specific indication for ASA at this time. I have discontinued ASA.   Rosalin Hawking, MD PhD Stroke Neurology 04/14/2015 4:30 PM

## 2015-04-14 NOTE — Progress Notes (Signed)
[facility] PROGRESS NOTE   Rose Sullivan [id number] DOB: 1934/01/22 DOA: 04/11/2015 PCP: [name], MD  HPI/Subjective: Feels OK, Denies any complaints.  Assessment/Plan: Principal Problem:   CVA (cerebral infarction) Active Problems:   Essential hypertension   Atrial fibrillation (HCC)   BREAST CANCER, HX OF   Chronic anticoagulation   DM (diabetes mellitus) (HCC)   AKI (acute kidney injury) ([place])   UTI (lower urinary tract infection)   Acute UTI   History of recent stroke   Right sided weakness    Acute CVA Patient has recent CVA and discharged home on Eliquis 5 mg twice a day. Patient came in with symptoms of confusion and right-sided weakness, MRI showed extension of the previous stroke. Per neurology this could be secondary to low blood pressure from UTI. Daughter told me patient was on Eliquis 5 mg once a day only since discharge, she was receiving Eliquis samples from her PCP till yesterday when she received the Eliquis from pharmacy then she realized it should be twice a day.  Dysphagia Secondary to stroke, see my summary. Recommended dysphagia 2 with nectar thick liquids.  UTI Urinalysis consistent with UTI, started on Rocephin. Culture showed multiple morphotypes, continue current antibiotics, switched to Ceftin on discharge.  Acute kidney injury Baseline creatinine is 1.23 from 03/24/2015, presented with creatinine of 1.6. Improving with IV fluid hydration, creatinine is 1.4 today.  Atrial fibrillation Her rate is controlled, continue current home medications. On Eliquis, 5 mg twice a day, per daughter she was on once a day at home. CHA2DS2-VASc of at least 7 for HTN, DM and being a female and 2 points for both age and history of stroke.  Essential hypertension Continue home medications.  Diabetes mellitus type 2 Carbohydrate modified diet and insulin sliding scale, hemoglobin A1c consistently below 6.5 since 2014.  History of breast  cancer On Aromasin   Code Status: Full Code Family Communication: Plan discussed with the patient. Disposition Plan: Remains inpatient Diet: DIET DYS 2 Room service appropriate?: Yes; Fluid consistency:: Nectar Thick  Consultants:  Stroke  Procedures:  None  Antibiotics:  Rocephin (indicate start date, and stop date if known)   Objective: Filed Vitals:   04/14/15 0515 04/14/15 0929  BP: 156/80 178/91  Pulse: 70 71  Temp: 97.4 F (36.3 C)   Resp: 16 20    Intake/Output Summary (Last 24 hours) at 04/14/15 1148 Last data filed at 04/14/15 0939  Gross per 24 hour  Intake    240 ml  Output    160 ml  Net     80 ml   Filed Weights   04/14/15 1027  Weight: 85.9 kg (189 lb 6 oz)    Exam: General: Alert and awake, oriented x3, not in any acute distress. HEENT: anicteric sclera, pupils reactive to light and accommodation, EOMI CVS: S1-S2 clear, no murmur rubs or gallops Chest: clear to auscultation bilaterally, no wheezing, rales or rhonchi Abdomen: soft nontender, nondistended, normal bowel sounds, no organomegaly Extremities: no cyanosis, clubbing or edema noted bilaterally Neuro: Cranial nerves II-XII intact, no focal neurological deficits  Data Reviewed: Basic Metabolic Panel:  Recent Labs Lab 04/11/15 1817 04/11/15 1821 04/12/15 0939  NA 142 141 143  K 4.1 4.4 3.6  CL 109 111 113*  CO2  --  17* 19*  GLUCOSE 137* 140* 102*  BUN 25* 22* 23*  CREATININE 1.40* 1.60* 1.41*  CALCIUM  --  9.5 8.7*  MG  --   --  2.0  Liver Function Tests:  Recent Labs Lab 04/11/15 1821 04/12/15 0939  AST 44* 32  ALT 24 21  ALKPHOS 72 63  BILITOT 1.2 1.2  PROT 7.7 6.9  ALBUMIN 3.7 3.5   No results for input(s): LIPASE, AMYLASE in the last 168 hours. No results for input(s): AMMONIA in the last 168 hours. CBC:  Recent Labs Lab 04/11/15 1817 04/11/15 1821 04/12/15 0939  WBC  --  11.1* 8.3  NEUTROABS  --  7.1 5.6  HGB 17.7* 16.0* 13.5  HCT 52.0* 47.3*  41.0  MCV  --  92.4 92.6  PLT  --  251 201   Cardiac Enzymes: No results for input(s): CKTOTAL, CKMB, CKMBINDEX, TROPONINI in the last 168 hours. BNP (last 3 results) No results for input(s): BNP in the last 8760 hours.  ProBNP (last 3 results) No results for input(s): PROBNP in the last 8760 hours.  CBG: No results for input(s): GLUCAP in the last 168 hours.  Micro Recent Results (from the past 240 hour(s))  Urine culture     Status: None   Collection Time: 04/11/15  9:12 PM  Result Value Ref Range Status   Specimen Description URINE, RANDOM  Final   Special Requests NONE  Final   Culture MULTIPLE SPECIES PRESENT, SUGGEST RECOLLECTION  Final   Report Status 04/13/2015 FINAL  Final     Studies: Dg Swallowing Func-speech Pathology  04/13/2015  Objective Swallowing Evaluation: Type of Study: MBS-Modified Barium Swallow Study Patient Details Name: Rose Sullivan MRN: [id number] Date of Birth: Jun 21, 1933 Today's Date: 04/13/2015 Time: SLP Start Time (ACUTE ONLY): 1336-SLP Stop Time (ACUTE ONLY): 1347 SLP Time Calculation (min) (ACUTE ONLY): 11 min Past Medical History: Past Medical History Diagnosis Date . GLUCOSE INTOLERANCE 02/11/2007 . HYPERLIPIDEMIA 02/11/2007 . BLINDNESS, [place], [place] DEFINITION 11/25/2006 . Other specified forms of hearing loss 09/05/2009 . HYPERTENSION 02/11/2007 . Atrial fibrillation ([place]) 03/07/2009 . GERD 02/11/2007 . LOW BACK PAIN, CHRONIC 11/25/2006 . BACK PAIN 12/10/2007 . Pain in Soft Tissues of Limb 02/11/2007 . OSTEOPENIA 02/11/2007 . FATIGUE 12/10/2007 . GAIT DISTURBANCE 12/10/2007 . DYSPNEA 03/18/2009 . Cough 03/07/2009 . CHEST PAIN 12/10/2007 . Abdominal pain, left lower quadrant 04/01/2007 . BREAST CANCER, HX OF 11/25/2006 . Nocturia 05/18/2010 . Hypersomnia 05/18/2010 . Impaired glucose tolerance 05/18/2010 . Allergic rhinitis, cause unspecified 07/23/2012 . Anxiety state, unspecified 02/04/2013 . Cancer [facility])  Past Surgical History: Past Surgical History Procedure Laterality  Date . Dilation and curettage of uterus   . S/p mastectomy   . Appendectomy   HPI: Rose Sullivan is a 82 y.o. female admitted with worsened right sided weakness, and Confusion. PMH:  Atrial Fibrillation, HTN and previous CVA with right sided Hemiparesis (03/16/2015). MRI Acute LEFT posterior cerebral artery territory infarcts (LEFT occipital lobe, LEFT thalamus). Subjective: pt alert, believes she is coughing less with the thicker drinks Assessment / Plan / Recommendation CHL IP CLINICAL IMPRESSIONS 04/13/2015 Therapy Diagnosis Mild oral phase dysphagia;Moderate oral phase dysphagia;Mild pharyngeal phase dysphagia Clinical Impression Pt has a mild-moderate oropharyngeal dysphagia with reduced bolus cohesion, anterior "munching" for mastication of solids, and incomplete a/p transit resulting in lingual residue. Provided cueing to clear residuals, although pt kept stating "I already did". She has a delayed swallow trigger, reduced hyolaryngeal elevation and excursion, and reduced UES relaxation although overall with good airway protection throughout this study. Question the impact of cognition and positioning on performance at bedside, as pt is certainly at risk for aspiration given dysphagia observed and has been exhibiting overt s/s clinically  that could not be replicated during this test. Recommend to continue current diet to reduce the risk of aspiration, but would provide trials of thin liquids during solid intake with SLP to assess for readiness to advance. Impact on safety and function Moderate aspiration risk   CHL IP TREATMENT RECOMMENDATION 04/13/2015 Treatment Recommendations Therapy as outlined in treatment plan below   Prognosis 04/13/2015 Prognosis for Safe Diet Advancement Good Barriers to Reach Goals Cognitive deficits Barriers/Prognosis Comment -- CHL IP DIET RECOMMENDATION 04/13/2015 SLP Diet Recommendations Dysphagia 2 (Fine chop) solids;Nectar thick liquid Liquid Administration via Cup Medication  Administration Whole meds with puree Compensations Small sips/bites;Slow rate Postural Changes Remain semi-upright after after feeds/meals (Comment);Seated upright at 90 degrees   CHL IP OTHER RECOMMENDATIONS 04/13/2015 Recommended Consults -- Oral Care Recommendations Oral care BID Other Recommendations Order thickener from pharmacy;Prohibited food (jello, ice cream, thin soups);Remove water pitcher   CHL IP FOLLOW UP RECOMMENDATIONS 04/13/2015 Follow up Recommendations Home health SLP;24 hour supervision/assistance   CHL IP FREQUENCY AND DURATION 04/13/2015 Speech Therapy Frequency (ACUTE ONLY) min 2x/week Treatment Duration 2 weeks      CHL IP ORAL PHASE 04/13/2015 Oral Phase Impaired Oral - Pudding Teaspoon -- Oral - Pudding Cup -- Oral - Honey Teaspoon -- Oral - Honey Cup -- Oral - Nectar Teaspoon -- Oral - Nectar Cup Decreased bolus cohesion;Delayed oral transit;Lingual/palatal residue Oral - Nectar Straw -- Oral - Thin Teaspoon -- Oral - Thin Cup Decreased bolus cohesion;Delayed oral transit;Lingual/palatal residue Oral - Thin Straw -- Oral - Puree Decreased bolus cohesion;Delayed oral transit;Lingual/palatal residue Oral - Mech Soft Decreased bolus cohesion;Delayed oral transit;Lingual/palatal residue;Other (Comment) Oral - Regular -- Oral - Multi-Consistency -- Oral - Pill -- Oral Phase - Comment --  CHL IP PHARYNGEAL PHASE 04/13/2015 Pharyngeal Phase Impaired Pharyngeal- Pudding Teaspoon -- Pharyngeal -- Pharyngeal- Pudding Cup -- Pharyngeal -- Pharyngeal- Honey Teaspoon -- Pharyngeal -- Pharyngeal- Honey Cup -- Pharyngeal -- Pharyngeal- Nectar Teaspoon -- Pharyngeal -- Pharyngeal- Nectar Cup Delayed swallow initiation-pyriform sinuses;Reduced anterior laryngeal mobility;Reduced laryngeal elevation;Reduced tongue base retraction;Pharyngeal residue - valleculae Pharyngeal -- Pharyngeal- Nectar Straw -- Pharyngeal -- Pharyngeal- Thin Teaspoon -- Pharyngeal -- Pharyngeal- Thin Cup Delayed swallow  initiation-pyriform sinuses;Reduced anterior laryngeal mobility;Reduced laryngeal elevation;Reduced tongue base retraction;Pharyngeal residue - valleculae Pharyngeal -- Pharyngeal- Thin Straw -- Pharyngeal -- Pharyngeal- Puree Reduced anterior laryngeal mobility;Reduced laryngeal elevation;Reduced tongue base retraction;Pharyngeal residue - valleculae;Delayed swallow initiation-vallecula Pharyngeal -- Pharyngeal- Mechanical Soft Reduced anterior laryngeal mobility;Reduced laryngeal elevation;Reduced tongue base retraction;Pharyngeal residue - valleculae;Delayed swallow initiation-vallecula Pharyngeal -- Pharyngeal- Regular -- Pharyngeal -- Pharyngeal- Multi-consistency -- Pharyngeal -- Pharyngeal- Pill -- Pharyngeal -- Pharyngeal Comment --  CHL IP CERVICAL ESOPHAGEAL PHASE 04/13/2015 Cervical Esophageal Phase Impaired Pudding Teaspoon -- Pudding Cup -- Honey Teaspoon -- Honey Cup -- Nectar Teaspoon -- Nectar Cup Reduced cricopharyngeal relaxation Nectar Straw -- Thin Teaspoon -- Thin Cup Reduced cricopharyngeal relaxation Thin Straw -- Puree Reduced cricopharyngeal relaxation Mechanical Soft Reduced cricopharyngeal relaxation Regular -- Multi-consistency -- Pill -- Cervical Esophageal Comment -- No flowsheet data found. Germain Osgood, M.A. CCC-SLP [phone] Germain Osgood 04/13/2015, 2:28 PM               Scheduled Meds: . apixaban  5 mg Oral BID  . aspirin  300 mg Rectal Daily   Or  . aspirin  325 mg Oral Daily  . cefTRIAXone (ROCEPHIN)  IV  1 g Intravenous Q24H  . diltiazem  240 mg Oral Daily  . exemestane  25 mg Oral Daily  . metoprolol  tartrate  12.5 mg Oral BID  . pravastatin  40 mg Oral q1800   Continuous Infusions:      Time spent: 35 minutes    [facility]  [facility] Pager [phone] If 7PM-7AM, please contact night-coverage at [email or web address], password [facility] 04/14/2015, 11:48 AM  LOS: 2 days

## 2015-04-14 NOTE — Progress Notes (Signed)
UR COMPLETED  

## 2015-04-14 NOTE — Progress Notes (Signed)
Physical Therapy Treatment Patient Details Name: Rose Sullivan MRN: [id number] DOB: [birth year] Today's Date: 04/14/2015    History of Present Illness  Daughter brought pt to ER after noticing increased right sided weakness and increased confusion. Pt was admitted with extension of CVA from 03/2015. MRI reveals acute left PCA, UTI and dehydration. PMH includes: Legally blind (since birth per pt report), DM. HTN, a-fib.    PT Comments    Patient able to try to scoot back in bed with firmer edge using sliding board, but still unsafe as pt slipping off EOB.  Likely needs step stool to allow her to safely scoot back in bed.  Daughter in room and discussed options for bed modification as well for improved safety.  Agree with HHPT to adjust and make recommendations for safety in the home environment.  Follow Up Recommendations  Home health PT;Supervision/Assistance - 24 hour     Equipment Recommendations  None recommended by PT    Recommendations for Other Services       Precautions / Restrictions Precautions Precautions: Fall    Mobility  Bed Mobility Overal bed mobility: Needs Assistance Bed Mobility: Rolling Rolling: Min assist Sidelying to sit: Mod assist;+2 for safety/equipment       General bed mobility comments: assist for legs off bed and to lift trunk upright  Transfers Overall transfer level: Needs assistance Equipment used: Rolling walker (2 wheeled) Transfers: Sit to/from Stand Sit to Stand: Min assist;+2 safety/equipment         General transfer comment: up from EOB high seat for her, pt pulls up on walker  Ambulation/Gait Ambulation/Gait assistance: Mod assist Ambulation Distance (Feet): 3 Feet Assistive device: Rolling walker (2 wheeled) Gait Pattern/deviations: Step-to pattern     General Gait Details: side stepping to HOB, pt declined ambulation due to feeling poorly; assist for balance, cues for technique and assist for walker   Stairs             Wheelchair Mobility    Modified Rankin (Stroke Patients Only)       Balance Overall balance assessment: Needs assistance   Sitting balance-Leahy Scale: Fair Sitting balance - Comments: as slides to EOB tends to lean posterior   Standing balance support: Bilateral upper extremity supported Standing balance-Leahy Scale: Poor Standing balance comment: UE support needed for balance                    Cognition Arousal/Alertness: Awake/alert Behavior During Therapy: WFL for tasks assessed/performed Overall Cognitive Status: History of cognitive impairments - at baseline                      Exercises      General Comments General comments (skin integrity, edema, etc.): daughter had wanted to try pt getting back in bed with sliding board at edge of bed due to her mattress too soft for pt to be able to scoot back; attempted with board at edge of hospital bed, pt able to scoot back on bed, but tendency to slide forward due to bed still to high for her at lowest setting; educated daughter to try to have step stool close she can place feet on when sitting on EOB to scoot back further as well as continue to work on lowering height of bed at home.  Also educated about assist needed for bed mobiltiy and reports she has been assisting patient at home.      Pertinent Vitals/Pain Pain Assessment: Faces Faces Pain  Scale: Hurts whole lot Pain Location: R LE with movement Pain Descriptors / Indicators: Aching;Sore Pain Intervention(s): Limited activity within patient's tolerance;Monitored during session;Repositioned    Home Living   Living Arrangements: Children                  Prior Function            PT Goals (current goals can now be found in the care plan section) Progress towards PT goals: Progressing toward goals    Frequency  Min 3X/week    PT Plan Current plan remains appropriate    Co-evaluation             End of Session  Equipment Utilized During Treatment: Gait belt Activity Tolerance: Patient limited by fatigue Patient left: in bed;with call bell/phone within reach;with bed alarm set;with family/visitor present     Time: [id number] PT Time Calculation (min) (ACUTE ONLY): 24 min  Charges:  $Therapeutic Activity: 8-22 mins $Self Care/Home Management: 8-22                    G Codes:      Reginia Naas 05/13/15, 1:47 PM  Magda Kiel, [place] 05/13/15

## 2015-04-15 ENCOUNTER — Inpatient Hospital Stay: Payer: Medicare Other | Admitting: Internal Medicine

## 2015-04-15 MED ORDER — APIXABAN 5 MG PO TABS: 5.0000 mg | ORAL_TABLET | Freq: Two times a day (BID) | ORAL | Status: DC

## 2015-04-15 MED ORDER — CEFUROXIME AXETIL 500 MG PO TABS: 500.0000 mg | ORAL_TABLET | Freq: Two times a day (BID) | ORAL | Status: DC

## 2015-04-15 NOTE — Progress Notes (Signed)
Family contacted to make aware of patient's discharge and readiness to go.

## 2015-04-15 NOTE — Progress Notes (Signed)
Physical Therapy Treatment Patient Details Name: Rose Sullivan MRN: [id number] DOB: Sep 08, 1933 Today's Date: 04/15/2015    History of Present Illness  Daughter brought pt to ER after noticing increased right sided weakness and increased confusion. Pt was admitted with extension of CVA from 03/2015. MRI reveals acute left PCA, UTI and dehydration. PMH includes: Legally blind (since birth per pt report), DM. HTN, a-fib.    PT Comments    Pt performed mobility with decreased assist and increased activity.  Pt required cues for hand placement during transfers.  Daughter not present but overall mobility improved from previous session.    Follow Up Recommendations  Home health PT;Supervision/Assistance - 24 hour     Equipment Recommendations  None recommended by PT    Recommendations for Other Services       Precautions / Restrictions Precautions Precautions: Fall Restrictions Weight Bearing Restrictions: No    Mobility  Bed Mobility Overal bed mobility: Needs Assistance Bed Mobility: Rolling Rolling: Supervision Sidelying to sit: Supervision;HOB elevated       General bed mobility comments: Pt required increased time but able to complete with out assistance.    Transfers Overall transfer level: Needs assistance Equipment used: Rolling walker (2 wheeled) Transfers: Sit to/from Stand Sit to Stand: Min assist Stand pivot transfers: Min assist       General transfer comment: Pt remains to pull on RW despite cues to push from seated surface.  Pt demonstrates forward flexed kyphotic posturing required cues for erect stance.  As pt fatigues posture decreases.  Ambulation/Gait Ambulation/Gait assistance: Mod assist Ambulation Distance (Feet): 18 Feet (+ 12) Assistive device: Rolling walker (2 wheeled) Gait Pattern/deviations: Step-to pattern;Shuffle;Decreased stride length;Trunk flexed     General Gait Details: Pt required cues for RW proximity, upper trunk  support/awareness and increasing B step length.     Stairs            Wheelchair Mobility    Modified Rankin (Stroke Patients Only)       Balance Overall balance assessment: Needs assistance   Sitting balance-Leahy Scale: Fair       Standing balance-Leahy Scale: Poor                      Cognition Arousal/Alertness: Awake/alert Behavior During Therapy: WFL for tasks assessed/performed Overall Cognitive Status: History of cognitive impairments - at baseline                      Exercises      General Comments        Pertinent Vitals/Pain Pain Assessment: No/denies pain    Home Living                      Prior Function            PT Goals (current goals can now be found in the care plan section) Acute Rehab PT Goals Patient Stated Goal: to go home, be able to get in/out of bed easier Potential to Achieve Goals: Good Progress towards PT goals: Progressing toward goals    Frequency  Min 3X/week    PT Plan Current plan remains appropriate    Co-evaluation             End of Session Equipment Utilized During Treatment: Gait belt Activity Tolerance: Patient limited by fatigue Patient left: with call bell/phone within reach;with family/visitor present;in chair;with chair alarm set     Time: [id number] PT Time Calculation (  min) (ACUTE ONLY): 24 min  Charges:  $Gait Training: 8-22 mins $Therapeutic Activity: 8-22 mins                    G Codes:      Cristela Blue May 01, 2015, 12:30 PM  Governor Rooks, PTA pager [phone]

## 2015-04-15 NOTE — Care Management Note (Signed)
Case Management Note  Patient Details  Name: Rose Sullivan MRN: [id number] Date of Birth: 01-01-1934  Subjective/Objective:                    Action/Plan: Discharged to home  with home health services  Expected Discharge Date:       04/15/2015            Expected Discharge Plan:  Home/Self Care  In-House Referral:     Discharge planning Services  CM Consult  Post Acute Care Choice:    Choice offered to:    patient/daughter [name])  DME Arranged:    DME Agency:  [place] Arranged:  RN, PT, Speech Therapy [place] Agency:     Status of Service:  Completed, signed off  Additional Comments:  Sharin Mons, [place] [phone] 04/15/2015, 4:15 PM

## 2015-04-15 NOTE — Progress Notes (Signed)
Rose Sullivan to be D/C'd Home per MD order.  Discussed with the patient and all questions fully answered.  VSS, Skin clean, dry and intact without evidence of skin break down, no evidence of skin tears noted. IV catheter discontinued intact. Site without signs and symptoms of complications. Dressing and pressure applied.  An After Visit Summary was printed and given to the patient. Patient received prescription.  D/c education completed with patient/family including follow up instructions, medication list, d/c activities limitations if indicated, with other d/c instructions as indicated by MD - patient able to verbalize understanding, all questions fully answered.   Patient instructed to return to ED, call 911, or call MD for any changes in condition.   Patient to be escorted via [place], and D/C home via private auto.  [place], [name] C 04/15/2015 3:24 PM

## 2015-04-15 NOTE — Consult Note (Signed)
   [facility] Inpatient Consult   04/15/2015  Rose Sullivan 1933-07-05 [id number] Patient was evaluated for [place] Management services and a consult received.  Met with the patient at bedside.  Patient was informed of the [place] Management services that she is eligible for though her Medicare insurance.  Patient endorses that Dr. [name] is her primary care provider.  She states she has a daughter that helps her make her decisions. Inpatient RNCM updated.  A brochure with contact information was given.  For questions, please contact: Natividad Brood, RN BSN [facility] Liaison  [phone] business mobile phone Toll free office [phone]

## 2015-04-15 NOTE — Discharge Summary (Signed)
Physician Discharge Summary  Rose Sullivan [id number] DOB: 09-Jul-1933 DOA: 04/11/2015  PCP: [name], MD  Admit date: 04/11/2015 Discharge date: 04/15/2015  Time spent: 40 minutes  Recommendations for Outpatient Follow-up:  1. Follow-up with primary care physician in one week. 2. Check CBC/BMP in 1 week.   Discharge Diagnoses:  Principal Problem:   CVA (cerebral infarction) Active Problems:   Essential hypertension   Atrial fibrillation (HCC)   BREAST CANCER, HX OF   Chronic anticoagulation   DM (diabetes mellitus) ([employer])   AKI (acute kidney injury) ([place])   UTI (lower urinary tract infection)   Acute UTI   History of recent stroke   Right sided weakness   Discharge Condition:   Diet recommendation: Dysphagia 2 nectar thick liquid  Filed Weights   04/14/15 1027  Weight: 85.9 kg (189 lb 6 oz)    History of present illness:  Rose Sullivan is a 82 y.o. female with a history of Atrial Fibrillation on Eliquis Rx, HTN and previous CVA with right sided Hemiparesis ( on 03/16/2015) who presents to the ED with complaints of worsening right sided weakness and confusion over the 24 hours. She is able to speak without dysarthria or aphasia but has problems with her memory and is unable to give a history of her symptoms. She was evaluated in the ED, and the EDP spoke with her Daughter and patients symptoms of worsening of her right sided weakness and increased confusion began yesterday. A Ct scan of the Head was performed and Revealed a new small left occipatal infarct. She was referred for further workup and Neurology was consulted.   Hospital Course:   Acute CVA Patient has recent CVA and discharged home on Eliquis 5 mg twice a day. Patient came in with symptoms of confusion and right-sided weakness, MRI showed extension of the previous stroke. Per neurology this could be secondary to low blood pressure from UTI. Stroke: acute L PCA territory infarcts,  extended from her left PCA stroke in 03/2015. This pattern dose not consistent with afib cardioembolism, but more likely due to UTI with sepsis and dehydration causing possible hypotension then leading to infarct in the same territory  Resultant - mild worsening of right UE weakness than one month ago  MRI L PCA territory infarcts (left occipital and left thalamic)  MRA Chronic left PCA occlusion. Stenotic right PCA.  No further stroke workup indicated (had complete work up in 03/2015)  Eliquis for VTE prophylaxis  DIET DYS 2 Room service appropriate?: Yes; Fluid consistency:: Nectar Thick  Disposition: Home with home health service.  Dysphagia Secondary to stroke, see my summary. Recommended dysphagia 2 with nectar thick liquids.  UTI Urinalysis consistent with UTI, started on Rocephin. Culture showed multiple morphotypes, continue current antibiotics, discharged home on Ceftin for 5 more days.  Acute kidney injury Baseline creatinine is 1.23 from 03/24/2015, presented with creatinine of 1.6. Improving with IV fluid hydration, creatinine is 1.4 on 3/14  Atrial fibrillation Her rate is controlled, continue current home medications. On Eliquis, 5 mg twice a day, per daughter she was on only taking once at home, instructed to take it twice a day. CHA2DS2-VASc of at least 7 for HTN, DM and being a female and 2 points for both age and history of stroke.  Essential hypertension Continue home medications.  Diabetes mellitus type 2 Carbohydrate modified diet and insulin sliding scale, hemoglobin A1c consistently below 6.5 since 2014.  History of breast cancer On Aromasin  Legally blind  Procedures:  None  Consultations:  Neuro  Discharge Exam: Filed Vitals:   04/15/15 0537 04/15/15 0905  BP: 133/64 133/66  Pulse: 96 73  Temp: 98.1 F (36.7 C) 97.6 F (36.4 C)  Resp: 18 20  General: Alert and awake, oriented x3, not in any acute distress. HEENT: anicteric  sclera, pupils reactive to light and accommodation, EOMI CVS: S1-S2 clear, no murmur rubs or gallops Chest: clear to auscultation bilaterally, no wheezing, rales or rhonchi Abdomen: soft nontender, nondistended, normal bowel sounds, no organomegaly Extremities: no cyanosis, clubbing or edema noted bilaterally Neuro: Cranial nerves II-XII intact, no focal neurological deficits  Discharge Instructions   Discharge Instructions    Diet - low sodium heart healthy    Complete by:  As directed      Increase activity slowly    Complete by:  As directed           Current Discharge Medication List    START taking these medications   Details  cefUROXime (CEFTIN) 500 MG tablet Take 1 tablet (500 mg total) by mouth 2 (two) times daily with a meal. Qty: 10 tablet, Refills: 0      CONTINUE these medications which have CHANGED   Details  apixaban (ELIQUIS) 5 MG TABS tablet Take 1 tablet (5 mg total) by mouth 2 (two) times daily. Qty: 60 tablet, Refills: 0      CONTINUE these medications which have NOT CHANGED   Details  Diclofenac Sodium 1.5 % SOLN Place 2 mLs onto the skin 3 (three) times daily. Qty: 3 Bottle, Refills: 3   Associated Diagnoses: Primary osteoarthritis of right hip; Primary osteoarthritis of left knee; Lumbar facet arthropathy    diltiazem (CARDIZEM CD) 240 MG 24 hr capsule Take 1 capsule (240 mg total) by mouth daily. Qty: 30 capsule, Refills: 6    exemestane (AROMASIN) 25 MG tablet Take 1 tablet (25 mg total) by mouth daily. Qty: 30 tablet, Refills: 1    megestrol (MEGACE) 400 MG/10ML suspension Take 10 mLs (400 mg total) by mouth daily. Qty: 240 mL, Refills: 0   Associated Diagnoses: Poor nutrition    metoprolol tartrate (LOPRESSOR) 25 MG tablet Take 0.5 tablets (12.5 mg total) by mouth 2 (two) times daily. Qty: 60 tablet, Refills: 1    oxyCODONE-acetaminophen (PERCOCET) 7.5-325 MG tablet Take 1 tablet by mouth every 8 (eight) hours as needed. Qty: 120 tablet,  Refills: 0   Associated Diagnoses: Lumbar facet arthropathy; Primary osteoarthritis of left knee; Primary osteoarthritis of right hip; Primary osteoarthritis of right knee    pravastatin (PRAVACHOL) 40 MG tablet Take 1 tablet (40 mg total) by mouth daily at 6 PM. Qty: 30 tablet, Refills: 0       Allergies  Allergen Reactions  . Trazodone And Nefazodone Other (See Comments)    hallucinations   Follow-up Information    Follow up with Dennie Bible, NP On 05/12/2015.   Specialty:  Family Medicine   Why:  At 1:30 PM   Contact information:   [address] [place] [id number] [phone]        The results of significant diagnostics from this hospitalization (including imaging, microbiology, ancillary and laboratory) are listed below for reference.    Significant Diagnostic Studies: Ct Head Wo Contrast  04/11/2015  CLINICAL DATA:  Right-sided weakness. Diagnosis with CVA last week. Physical therapist noticed worsening right-sided weakness today. EXAM: CT HEAD WITHOUT CONTRAST TECHNIQUE: Contiguous axial images were obtained from the base of the skull through  the vertex without intravenous contrast. COMPARISON:  Head CT dated 03/24/2015. FINDINGS: There is generalized brain atrophy with commensurate dilatation of the ventricles and sulci. Chronic small vessel ischemic changes again noted within the bilateral periventricular and subcortical white matter regions. Small old lacunar infarcts noted within each basal ganglia region. These findings appear stable compared to the previous head CT. There is a new small low-density focus within the lower left occipital lobe, most suggestive of subacute infarct, possibly chronic but new compared to the most recent head CT of 03/24/2015. No associated mass effect or midline shift. No parenchymal or extra-axial hemorrhage. Ventricles are stable in size and configuration. No osseous abnormality.  Superficial soft tissues are  unremarkable. IMPRESSION: 1. New small low-density focus within the lower left occipital lobe, most suggestive of a small infarct of subacute or chronic age. Favor subacute infarct, possibly chronic but new compared to the most recent head CT of 03/24/2015. This could be confirmed with MRI if clinically needed. 2. Additional chronic ischemic changes, as detailed above. 3. No acute-appearing findings.  No intracranial hemorrhage. Electronically Signed   By: Franki Cabot M.D.   On: 04/11/2015 21:07   Ct Head Wo Contrast  03/24/2015  CLINICAL DATA:  Confusion with altered mental status. EXAM: CT HEAD WITHOUT CONTRAST TECHNIQUE: Contiguous axial images were obtained from the base of the skull through the vertex without intravenous contrast. COMPARISON:  03/13/2015 FINDINGS: There is no evidence for acute hemorrhage, hydrocephalus, mass lesion, or abnormal extra-axial fluid collection. No definite CT evidence for acute infarction. Diffuse loss of parenchymal volume is consistent with atrophy. Patchy low attenuation in the deep hemispheric and periventricular white matter is nonspecific, but likely reflects chronic microvascular ischemic demyelination. The visualized paranasal sinuses and mastoid air cells are clear. IMPRESSION: Stable exam.  No acute intracranial abnormality. Atrophy with chronic small vessel white matter ischemic demyelination. Electronically Signed   By: Misty Stanley M.D.   On: 03/24/2015 17:19   Mr Brain Wo Contrast  04/12/2015  CLINICAL DATA:  Worsening RIGHT-sided weakness and confusion for 1 day. Recent stroke resulting in RIGHT-sided hemi paresis. History of atrial fibrillation on Eliquis. History of hypertension, breast cancer, hyperlipidemia. EXAM: MRI HEAD WITHOUT CONTRAST MRA HEAD WITHOUT CONTRAST TECHNIQUE: Multiplanar, multiecho pulse sequences of the brain and surrounding structures were obtained without intravenous contrast. Angiographic images of the head were obtained using MRA  technique without contrast. COMPARISON:  CT head April 11, 2015 and MRI of the brain March 13, 2015 and CT angiogram of the head March 14, 2015 FINDINGS: MRI HEAD FINDINGS Moderately motion degraded axial T2 sequence. Patchy reduced diffusion LEFT posterior temporal lobe, LEFT mesial occipital lobe. Subcentimeter focus of reduced diffusion LEFT thalamus, corresponding areas demonstrate low ADC value, NR in similar distribution to recent strokes. No susceptibility artifact to suggest hemorrhage. Moderate to severe ventriculomegaly on the basis of global parenchymal brain volume loss, stable from prior MRI. Confluent supratentorial white matter T2 hyperintensities. No abnormal extra-axial fluid collections. No abnormal sellar expansion. No cerebellar tonsillar ectopia. Status post bilateral ocular lens implants. Small RIGHT maxillary mucosal retention cyst without paranasal sinus air-fluid levels. Mastoid air cells appear well-aerated. No suspicious calvarial bone marrow signal. MRA HEAD FINDINGS Moderately motion degraded examination. Anterior circulation: Normal flow related enhancement of the included cervical, petrous, cavernous and supraclinoid internal carotid arteries. Normal flow related enhancement of the anterior and middle cerebral arteries, including distal segments. Posterior circulation: LEFT vertebral artery appears dominant. Flow related enhancement of the basilar artery. Occluded  LEFT posterior cerebral artery at proximal LEFT P1 segment. Thready stenotic RIGHT posterior cerebral artery, was characterized on prior CTA head. Due to motion, limited assessment for luminal irregularity and aneurysm. IMPRESSION: MRI HEAD: Acute LEFT posterior cerebral artery territory infarcts (LEFT occipital lobe, LEFT thalamus). No hemorrhagic conversion. Please note, this is in similar vascular territory to recent infarcts. Severe chronic small vessel ischemic disease. Stable moderate to severe global parenchymal  brain volume loss. MRA HEAD: Moderately motion degraded examination. Chronically occluded LEFT posterior cerebral artery. Stenotic RIGHT posterior cerebral artery better characterized on prior CTA head. Electronically Signed   By: Elon Alas M.D.   On: 04/12/2015 05:44   Dg Swallowing Func-speech Pathology  04/13/2015  Objective Swallowing Evaluation: Type of Study: MBS-Modified Barium Swallow Study Patient Details Name: Rose Sullivan MRN: [id number] Date of Birth: August 12, 1933 Today's Date: 04/13/2015 Time: SLP Start Time (ACUTE ONLY): 1336-SLP Stop Time (ACUTE ONLY): [id number] SLP Time Calculation (min) (ACUTE ONLY): 11 min Past Medical History: Past Medical History Diagnosis Date . GLUCOSE INTOLERANCE 02/11/2007 . HYPERLIPIDEMIA 02/11/2007 . BLINDNESS, [place], [place] DEFINITION 11/25/2006 . Other specified forms of hearing loss 09/05/2009 . HYPERTENSION 02/11/2007 . Atrial fibrillation ([place]) 03/07/2009 . GERD 02/11/2007 . LOW BACK PAIN, CHRONIC 11/25/2006 . BACK PAIN 12/10/2007 . Pain in Soft Tissues of Limb 02/11/2007 . OSTEOPENIA 02/11/2007 . FATIGUE 12/10/2007 . GAIT DISTURBANCE 12/10/2007 . DYSPNEA 03/18/2009 . Cough 03/07/2009 . CHEST PAIN 12/10/2007 . Abdominal pain, left lower quadrant 04/01/2007 . BREAST CANCER, HX OF 11/25/2006 . Nocturia 05/18/2010 . Hypersomnia 05/18/2010 . Impaired glucose tolerance 05/18/2010 . Allergic rhinitis, cause unspecified 07/23/2012 . Anxiety state, unspecified 02/04/2013 . Cancer [facility])  Past Surgical History: Past Surgical History Procedure Laterality Date . Dilation and curettage of uterus   . S/p mastectomy   . Appendectomy   HPI: Rose Sullivan is a 82 y.o. female admitted with worsened right sided weakness, and Confusion. PMH:  Atrial Fibrillation, HTN and previous CVA with right sided Hemiparesis (03/16/2015). MRI Acute LEFT posterior cerebral artery territory infarcts (LEFT occipital lobe, LEFT thalamus). Subjective: pt alert, believes she is coughing less with the thicker drinks  Assessment / Plan / Recommendation CHL IP CLINICAL IMPRESSIONS 04/13/2015 Therapy Diagnosis Mild oral phase dysphagia;Moderate oral phase dysphagia;Mild pharyngeal phase dysphagia Clinical Impression Pt has a mild-moderate oropharyngeal dysphagia with reduced bolus cohesion, anterior "munching" for mastication of solids, and incomplete a/p transit resulting in lingual residue. Provided cueing to clear residuals, although pt kept stating "I already did". She has a delayed swallow trigger, reduced hyolaryngeal elevation and excursion, and reduced UES relaxation although overall with good airway protection throughout this study. Question the impact of cognition and positioning on performance at bedside, as pt is certainly at risk for aspiration given dysphagia observed and has been exhibiting overt s/s clinically that could not be replicated during this test. Recommend to continue current diet to reduce the risk of aspiration, but would provide trials of thin liquids during solid intake with SLP to assess for readiness to advance. Impact on safety and function Moderate aspiration risk   CHL IP TREATMENT RECOMMENDATION 04/13/2015 Treatment Recommendations Therapy as outlined in treatment plan below   Prognosis 04/13/2015 Prognosis for Safe Diet Advancement Good Barriers to Reach Goals Cognitive deficits Barriers/Prognosis Comment -- CHL IP DIET RECOMMENDATION 04/13/2015 SLP Diet Recommendations Dysphagia 2 (Fine chop) solids;Nectar thick liquid Liquid Administration via Cup Medication Administration Whole meds with puree Compensations Small sips/bites;Slow rate Postural Changes Remain semi-upright after after feeds/meals (Comment);Seated upright at 90 degrees  CHL IP OTHER RECOMMENDATIONS 04/13/2015 Recommended Consults -- Oral Care Recommendations Oral care BID Other Recommendations Order thickener from pharmacy;Prohibited food (jello, ice cream, thin soups);Remove water pitcher   CHL IP FOLLOW UP RECOMMENDATIONS 04/13/2015  Follow up Recommendations Home health SLP;24 hour supervision/assistance   CHL IP FREQUENCY AND DURATION 04/13/2015 Speech Therapy Frequency (ACUTE ONLY) min 2x/week Treatment Duration 2 weeks      CHL IP ORAL PHASE 04/13/2015 Oral Phase Impaired Oral - Pudding Teaspoon -- Oral - Pudding Cup -- Oral - Honey Teaspoon -- Oral - Honey Cup -- Oral - Nectar Teaspoon -- Oral - Nectar Cup Decreased bolus cohesion;Delayed oral transit;Lingual/palatal residue Oral - Nectar Straw -- Oral - Thin Teaspoon -- Oral - Thin Cup Decreased bolus cohesion;Delayed oral transit;Lingual/palatal residue Oral - Thin Straw -- Oral - Puree Decreased bolus cohesion;Delayed oral transit;Lingual/palatal residue Oral - Mech Soft Decreased bolus cohesion;Delayed oral transit;Lingual/palatal residue;Other (Comment) Oral - Regular -- Oral - Multi-Consistency -- Oral - Pill -- Oral Phase - Comment --  CHL IP PHARYNGEAL PHASE 04/13/2015 Pharyngeal Phase Impaired Pharyngeal- Pudding Teaspoon -- Pharyngeal -- Pharyngeal- Pudding Cup -- Pharyngeal -- Pharyngeal- Honey Teaspoon -- Pharyngeal -- Pharyngeal- Honey Cup -- Pharyngeal -- Pharyngeal- Nectar Teaspoon -- Pharyngeal -- Pharyngeal- Nectar Cup Delayed swallow initiation-pyriform sinuses;Reduced anterior laryngeal mobility;Reduced laryngeal elevation;Reduced tongue base retraction;Pharyngeal residue - valleculae Pharyngeal -- Pharyngeal- Nectar Straw -- Pharyngeal -- Pharyngeal- Thin Teaspoon -- Pharyngeal -- Pharyngeal- Thin Cup Delayed swallow initiation-pyriform sinuses;Reduced anterior laryngeal mobility;Reduced laryngeal elevation;Reduced tongue base retraction;Pharyngeal residue - valleculae Pharyngeal -- Pharyngeal- Thin Straw -- Pharyngeal -- Pharyngeal- Puree Reduced anterior laryngeal mobility;Reduced laryngeal elevation;Reduced tongue base retraction;Pharyngeal residue - valleculae;Delayed swallow initiation-vallecula Pharyngeal -- Pharyngeal- Mechanical Soft Reduced anterior laryngeal  mobility;Reduced laryngeal elevation;Reduced tongue base retraction;Pharyngeal residue - valleculae;Delayed swallow initiation-vallecula Pharyngeal -- Pharyngeal- Regular -- Pharyngeal -- Pharyngeal- Multi-consistency -- Pharyngeal -- Pharyngeal- Pill -- Pharyngeal -- Pharyngeal Comment --  CHL IP CERVICAL ESOPHAGEAL PHASE 04/13/2015 Cervical Esophageal Phase Impaired Pudding Teaspoon -- Pudding Cup -- Honey Teaspoon -- Honey Cup -- Nectar Teaspoon -- Nectar Cup Reduced cricopharyngeal relaxation Nectar Straw -- Thin Teaspoon -- Thin Cup Reduced cricopharyngeal relaxation Thin Straw -- Puree Reduced cricopharyngeal relaxation Mechanical Soft Reduced cricopharyngeal relaxation Regular -- Multi-consistency -- Pill -- Cervical Esophageal Comment -- No flowsheet data found. Germain Osgood, M.A. CCC-SLP [phone] Germain Osgood 04/13/2015, 2:28 PM              Mr Jodene Nam Head/brain Wo Cm  04/12/2015  CLINICAL DATA:  Worsening RIGHT-sided weakness and confusion for 1 day. Recent stroke resulting in RIGHT-sided hemi paresis. History of atrial fibrillation on Eliquis. History of hypertension, breast cancer, hyperlipidemia. EXAM: MRI HEAD WITHOUT CONTRAST MRA HEAD WITHOUT CONTRAST TECHNIQUE: Multiplanar, multiecho pulse sequences of the brain and surrounding structures were obtained without intravenous contrast. Angiographic images of the head were obtained using MRA technique without contrast. COMPARISON:  CT head April 11, 2015 and MRI of the brain March 13, 2015 and CT angiogram of the head March 14, 2015 FINDINGS: MRI HEAD FINDINGS Moderately motion degraded axial T2 sequence. Patchy reduced diffusion LEFT posterior temporal lobe, LEFT mesial occipital lobe. Subcentimeter focus of reduced diffusion LEFT thalamus, corresponding areas demonstrate low ADC value, NR in similar distribution to recent strokes. No susceptibility artifact to suggest hemorrhage. Moderate to severe ventriculomegaly on the basis of  global parenchymal brain volume loss, stable from prior MRI. Confluent supratentorial white matter T2 hyperintensities. No abnormal extra-axial fluid collections. No abnormal sellar expansion. No cerebellar tonsillar  ectopia. Status post bilateral ocular lens implants. Small RIGHT maxillary mucosal retention cyst without paranasal sinus air-fluid levels. Mastoid air cells appear well-aerated. No suspicious calvarial bone marrow signal. MRA HEAD FINDINGS Moderately motion degraded examination. Anterior circulation: Normal flow related enhancement of the included cervical, petrous, cavernous and supraclinoid internal carotid arteries. Normal flow related enhancement of the anterior and middle cerebral arteries, including distal segments. Posterior circulation: LEFT vertebral artery appears dominant. Flow related enhancement of the basilar artery. Occluded LEFT posterior cerebral artery at proximal LEFT P1 segment. Thready stenotic RIGHT posterior cerebral artery, was characterized on prior CTA head. Due to motion, limited assessment for luminal irregularity and aneurysm. IMPRESSION: MRI HEAD: Acute LEFT posterior cerebral artery territory infarcts (LEFT occipital lobe, LEFT thalamus). No hemorrhagic conversion. Please note, this is in similar vascular territory to recent infarcts. Severe chronic small vessel ischemic disease. Stable moderate to severe global parenchymal brain volume loss. MRA HEAD: Moderately motion degraded examination. Chronically occluded LEFT posterior cerebral artery. Stenotic RIGHT posterior cerebral artery better characterized on prior CTA head. Electronically Signed   By: Elon Alas M.D.   On: 04/12/2015 05:44    Microbiology: Recent Results (from the past 240 hour(s))  Urine culture     Status: None   Collection Time: 04/11/15  9:12 PM  Result Value Ref Range Status   Specimen Description URINE, RANDOM  Final   Special Requests NONE  Final   Culture MULTIPLE SPECIES PRESENT,  SUGGEST RECOLLECTION  Final   Report Status 04/13/2015 FINAL  Final     Labs: Basic Metabolic Panel:  Recent Labs Lab 04/11/15 1817 04/11/15 1821 04/12/15 0939  NA 142 141 143  K 4.1 4.4 3.6  CL 109 111 113*  CO2  --  17* 19*  GLUCOSE 137* 140* 102*  BUN 25* 22* 23*  CREATININE 1.40* 1.60* 1.41*  CALCIUM  --  9.5 8.7*  MG  --   --  2.0   Liver Function Tests:  Recent Labs Lab 04/11/15 1821 04/12/15 0939  AST 44* 32  ALT 24 21  ALKPHOS 72 63  BILITOT 1.2 1.2  PROT 7.7 6.9  ALBUMIN 3.7 3.5   No results for input(s): LIPASE, AMYLASE in the last 168 hours. No results for input(s): AMMONIA in the last 168 hours. CBC:  Recent Labs Lab 04/11/15 1817 04/11/15 1821 04/12/15 0939  WBC  --  11.1* 8.3  NEUTROABS  --  7.1 5.6  HGB 17.7* 16.0* 13.5  HCT 52.0* 47.3* 41.0  MCV  --  92.4 92.6  PLT  --  251 201   Cardiac Enzymes: No results for input(s): CKTOTAL, CKMB, CKMBINDEX, TROPONINI in the last 168 hours. BNP: BNP (last 3 results) No results for input(s): BNP in the last 8760 hours.  ProBNP (last 3 results) No results for input(s): PROBNP in the last 8760 hours.  CBG: No results for input(s): GLUCAP in the last 168 hours.     Signed:  Birdie Hopes MD.  [facility] 04/15/2015, 12:17 PM

## 2015-04-15 NOTE — Care Management Note (Signed)
Case Management Note  Patient Details  Name: MERTHA CLUKEY MRN: [id number] Date of Birth: [birth year]  Subjective/Objective:               Admitted for CVA.     Action/Plan:  Return to home when medically stable. Resumption of home health services ( RN,PT) @ discharged.  Expected Discharge Date:    04/15/2015              Expected Discharge Plan:  Home/Self Care  In-House Referral:     Discharge planning Services  CM Consult  Post Acute Care Choice:    Choice offered to:     DME Arranged:    DME Agency:  [place] Arranged:  RN, PT [facility] Agency:     Status of Service:  In process, will continue to follow  Medicare Important Message Given:    Date Medicare IM Given:    Medicare IM give by:    Date Additional Medicare IM Given:    Additional Medicare Important Message give by:     If discussed at [place] of Stay Meetings, dates discussed:    Additional Comments:  Sharin Mons, [place] [phone] 04/15/2015, 10:38 AM

## 2015-04-15 NOTE — Care Management Important Message (Signed)
Important Message  Patient Details  Name: Rose Sullivan MRN: [id number] Date of Birth: May 25, 1933   Medicare Important Message Given:  Yes    Hanan Mcwilliams P Jarrod Bodkins 04/15/2015, 3:06 PM

## 2015-04-15 NOTE — Discharge Instructions (Signed)

## 2015-04-20 ENCOUNTER — Ambulatory Visit: Payer: Medicare Other | Admitting: Physical Medicine & Rehabilitation

## 2015-04-20 ENCOUNTER — Telehealth: Payer: Self-pay | Admitting: Internal Medicine

## 2015-04-20 NOTE — Telephone Encounter (Signed)
Tillie Rung is calling to get verbals on PT- 2 week 1 , followed by a recert    She is also asking for a home health aid to come out and bathe the patient one per week. The patient is up for recert on [id number]

## 2015-04-21 ENCOUNTER — Telehealth: Payer: Self-pay | Admitting: Internal Medicine

## 2015-04-21 NOTE — Telephone Encounter (Signed)
Needs verbal order to recertify for another 60 day for nursing and speech therapy.

## 2015-04-25 ENCOUNTER — Ambulatory Visit: Payer: Medicare Other | Admitting: Registered Nurse

## 2015-04-25 ENCOUNTER — Other Ambulatory Visit: Payer: Self-pay | Admitting: Physical Medicine & Rehabilitation

## 2015-04-25 ENCOUNTER — Telehealth: Payer: Self-pay | Admitting: Physical Medicine & Rehabilitation

## 2015-04-25 NOTE — Telephone Encounter (Signed)
Received electronic request for Megace....do you want to continue trial?....please advise

## 2015-04-25 NOTE — Telephone Encounter (Signed)
Will assess at visit, thanks

## 2015-04-25 NOTE — Telephone Encounter (Signed)
What it is the best way to ask/assess patient regarding issue with pinky finger?

## 2015-04-25 NOTE — Telephone Encounter (Signed)
Rose Sullivan ST with [facility] needs to get verbal orders to recert this patient.  Please call her at [phone].

## 2015-04-25 NOTE — Telephone Encounter (Signed)
Rose Sullivan has called back regarding the encounter below and also I have scheduled her for Friday for a hospital f/u and tracy requesting Dr. Jenny Reichmann assess her right pinky finger. It is dark and is similar but different than bruise.  Can you please give her a call back.

## 2015-04-26 ENCOUNTER — Telehealth: Payer: Self-pay | Admitting: *Deleted

## 2015-04-26 NOTE — Telephone Encounter (Signed)
Diane is calling to continue OT on Halie[place] through the new recert period.  She is requesting 2wk1, 3wk1, then 2 wk5.  Approval given

## 2015-04-26 NOTE — Telephone Encounter (Signed)
Approval given

## 2015-04-27 ENCOUNTER — Telehealth: Payer: Self-pay | Admitting: *Deleted

## 2015-04-27 NOTE — Telephone Encounter (Signed)
Shauna Hugh Rolin Barry, OT, [facility] called asking for verbal orders for a bath aid 1week1 followed by 2weeks8. Verbal order given per office protocol

## 2015-04-29 ENCOUNTER — Encounter: Payer: Self-pay | Admitting: Internal Medicine

## 2015-04-29 ENCOUNTER — Ambulatory Visit (INDEPENDENT_AMBULATORY_CARE_PROVIDER_SITE_OTHER): Payer: Medicare Other | Admitting: Internal Medicine

## 2015-04-29 ENCOUNTER — Ambulatory Visit: Payer: Medicare Other | Admitting: Internal Medicine

## 2015-04-29 ENCOUNTER — Other Ambulatory Visit (INDEPENDENT_AMBULATORY_CARE_PROVIDER_SITE_OTHER): Payer: Medicare Other

## 2015-04-29 VITALS — BP 124/70 | HR 64 | Temp 97.5°F | Ht 60.0 in | Wt 178.2 lb

## 2015-04-29 DIAGNOSIS — I639 Cerebral infarction, unspecified: Secondary | ICD-10-CM

## 2015-04-29 DIAGNOSIS — I10 Essential (primary) hypertension: Secondary | ICD-10-CM

## 2015-04-29 DIAGNOSIS — R7302 Impaired glucose tolerance (oral): Secondary | ICD-10-CM | POA: Diagnosis not present

## 2015-04-29 DIAGNOSIS — E785 Hyperlipidemia, unspecified: Secondary | ICD-10-CM | POA: Diagnosis not present

## 2015-04-29 DIAGNOSIS — H9193 Unspecified hearing loss, bilateral: Secondary | ICD-10-CM

## 2015-04-29 LAB — BASIC METABOLIC PANEL
BUN: 33 mg/dL — ABNORMAL HIGH (ref 6–23)
CALCIUM: 9.5 mg/dL (ref 8.4–10.5)
CO2: 24 mEq/L (ref 19–32)
Chloride: 109 mEq/L (ref 96–112)
Creatinine, Ser: 1.34 mg/dL — ABNORMAL HIGH (ref 0.40–1.20)
GFR: 40.45 mL/min — AB (ref >60.00)
GLUCOSE: 131 mg/dL — AB (ref 70–99)
POTASSIUM: 4.1 meq/L (ref 3.5–5.1)
SODIUM: 141 meq/L (ref 135–145)

## 2015-04-29 LAB — HEPATIC FUNCTION PANEL
ALBUMIN: 4 g/dL (ref 3.5–5.2)
ALK PHOS: 68 U/L (ref 39–117)
ALT: 18 U/L (ref 0–35)
AST: 26 U/L (ref 0–37)
BILIRUBIN DIRECT: 0.3 mg/dL (ref 0.0–0.3)
BILIRUBIN TOTAL: 1 mg/dL (ref 0.2–1.2)
Total Protein: 7.5 g/dL (ref 6.0–8.3)

## 2015-04-29 LAB — CBC WITH DIFFERENTIAL/PLATELET
BASOS PCT: 0.1 % (ref 0.0–3.0)
Basophils Absolute: 0 10*3/uL (ref 0.0–0.1)
EOS PCT: 2.6 % (ref 0.0–5.0)
Eosinophils Absolute: 0.3 10*3/uL (ref 0.0–0.7)
HCT: 44.1 % (ref 36.0–46.0)
Hemoglobin: 15 g/dL (ref 12.0–15.0)
LYMPHS ABS: 2.1 10*3/uL (ref 0.7–4.0)
Lymphocytes Relative: 20.9 % (ref 12.0–46.0)
MCHC: 33.9 g/dL (ref 30.0–36.0)
MCV: 91.4 fl (ref 78.0–100.0)
MONO ABS: 0.6 10*3/uL (ref 0.1–1.0)
MONOS PCT: 5.7 % (ref 3.0–12.0)
NEUTROS ABS: 7.2 10*3/uL (ref 1.4–7.7)
Neutrophils Relative %: 70.7 % (ref 43.0–77.0)
Platelets: 254 10*3/uL (ref 150.0–400.0)
RBC: 4.82 Mil/uL (ref 3.87–5.11)
RDW: 13.3 % (ref 11.5–15.5)
WBC: 10.2 10*3/uL (ref 4.0–10.5)

## 2015-04-29 LAB — LIPID PANEL
CHOL/HDL RATIO: 3
CHOLESTEROL: 90 mg/dL (ref 0–200)
HDL: 34.4 mg/dL — ABNORMAL LOW (ref >39.00)
LDL CALC: 37 mg/dL (ref 0–99)
NONHDL: 55.14
Triglycerides: 93 mg/dL (ref 0.0–149.0)
VLDL: 18.6 mg/dL (ref 0.0–40.0)

## 2015-04-29 NOTE — Assessment & Plan Note (Signed)
stable overall by history and exam, recent data reviewed with pt, and pt to continue medical treatment as before,  to f/u any worsening symptoms or concerns BP Readings from Last 3 Encounters:  04/29/15 124/70  04/15/15 129/71  04/05/15 127/61

## 2015-04-29 NOTE — Assessment & Plan Note (Signed)
stable overall by history and exam, and pt to continue medical treatment as before,  to f/u any worsening symptoms or concerns 

## 2015-04-29 NOTE — Assessment & Plan Note (Signed)
stable overall by history and exam, recent data reviewed with pt, and pt to continue medical treatment as before,  to f/u any worsening symptoms or concerns Lab Results  Component Value Date   LDLCALC 37 04/29/2015

## 2015-04-29 NOTE — Assessment & Plan Note (Signed)
stable overall by history and exam, recent data reviewed with pt, and pt to continue medical treatment as before,  to f/u any worsening symptoms or concerns Lab Results  Component Value Date   HGBA1C 5.7* 03/14/2015

## 2015-04-29 NOTE — Progress Notes (Signed)
Pre visit review using our clinic review tool, if applicable. No additional management support is needed unless otherwise documented below in the visit note. 

## 2015-04-29 NOTE — Progress Notes (Signed)
Subjective:    Patient ID: Rose Sullivan, female    DOB: 26-Dec-1933, 82 y.o.   MRN: [id number]  HPI  Here to f/u with daughter who give most of the hx, s/p 2 recent hospn for UTI, then acute ischemia CVA; Pt denies chest pain, increased sob or doe, wheezing, orthopnea, PND, increased LE swelling, palpitations, dizziness or syncope.  Pt denies new neurological symptoms such as new headache, or facial or extremity weakness or numbness   Pt denies polydipsia, polyuria. Finished antbx. No new complaints. Overall good compliance with treatment, and good medicine tolerability. Was never able to complete the ENT referral for ear wax impactions, Still with bilat hearing loss.  Pt denies fever, wt loss, night sweats. Past Medical History  Diagnosis Date  . GLUCOSE INTOLERANCE 02/11/2007  . HYPERLIPIDEMIA 02/11/2007  . BLINDNESS, [place], [place] DEFINITION 11/25/2006  . Other specified forms of hearing loss 09/05/2009  . HYPERTENSION 02/11/2007  . Atrial fibrillation ([place]) 03/07/2009  . GERD 02/11/2007  . LOW BACK PAIN, CHRONIC 11/25/2006  . BACK PAIN 12/10/2007  . Pain in Soft Tissues of Limb 02/11/2007  . OSTEOPENIA 02/11/2007  . FATIGUE 12/10/2007  . GAIT DISTURBANCE 12/10/2007  . DYSPNEA 03/18/2009  . Cough 03/07/2009  . CHEST PAIN 12/10/2007  . Abdominal pain, left lower quadrant 04/01/2007  . BREAST CANCER, HX OF 11/25/2006  . Nocturia 05/18/2010  . Hypersomnia 05/18/2010  . Impaired glucose tolerance 05/18/2010  . Allergic rhinitis, cause unspecified 07/23/2012  . Anxiety state, unspecified 02/04/2013  . Cancer [facility])    Past Surgical History  Procedure Laterality Date  . Dilation and curettage of uterus    . S/p mastectomy    . Appendectomy      reports that she has quit smoking. She has never used smokeless tobacco. She reports that she does not drink alcohol or use illicit drugs. family history includes COPD in her brother; Colon cancer in her father; Hypertension in her brother; Lung cancer in her  brother; Stroke in her mother. Allergies  Allergen Reactions  . Trazodone And Nefazodone Other (See Comments)    hallucinations   Current Outpatient Prescriptions on File Prior to Visit  Medication Sig Dispense Refill  . apixaban (ELIQUIS) 5 MG TABS tablet Take 1 tablet (5 mg total) by mouth 2 (two) times daily. 60 tablet 0  . Diclofenac Sodium 1.5 % SOLN Place 2 mLs onto the skin 3 (three) times daily. 3 Bottle 3  . diltiazem (CARDIZEM CD) 240 MG 24 hr capsule Take 1 capsule (240 mg total) by mouth daily. 30 capsule 6  . exemestane (AROMASIN) 25 MG tablet Take 1 tablet (25 mg total) by mouth daily. 30 tablet 1  . megestrol (MEGACE) 400 MG/10ML suspension Take 10 mLs (400 mg total) by mouth daily. 240 mL 0  . metoprolol tartrate (LOPRESSOR) 25 MG tablet Take 0.5 tablets (12.5 mg total) by mouth 2 (two) times daily. 60 tablet 1  . oxyCODONE-acetaminophen (PERCOCET) 7.5-325 MG tablet Take 1 tablet by mouth every 8 (eight) hours as needed. 120 tablet 0  . pravastatin (PRAVACHOL) 40 MG tablet Take 1 tablet (40 mg total) by mouth daily at 6 PM. 30 tablet 0   No current facility-administered medications on file prior to visit.    Review of Systems  Constitutional: Negative for unusual diaphoresis or night sweats HENT: Negative for ear swelling or discharge Eyes: Negative for worsening visual haziness  Respiratory: Negative for choking and stridor.   Gastrointestinal: Negative for distension or  worsening eructation Genitourinary: Negative for retention or change in urine volume.  Musculoskeletal: Negative for other MSK pain or swelling Skin: Negative for color change and worsening wound Neurological: Negative for tremors and numbness other than noted  Psychiatric/Behavioral: Negative for decreased concentration or agitation other than above       Objective:   Physical Exam BP 124/70 mmHg  Pulse 64  Temp(Src) 97.5 F (36.4 C) (Oral)  Ht 5' (1.524 m)  Wt 178 lb 4 oz (80.854 kg)  BMI  34.81 kg/m2  SpO2 92% VS noted, obese, in wheelchair Constitutional: Pt appears in no apparent distress HENT: Head: NCAT.  Right Ear: External ear normal.  Left Ear: External ear normal.  Eyes: . Pupils are equal, round, and reactive to light. Conjunctivae and EOM are normal Neck: Normal range of motion. Neck supple.  Cardiovascular: Normal rate and regular rhythm.   Pulmonary/Chest: Effort normal and breath sounds without rales or wheezing.  Abd:  Soft, NT, ND, + BS, no flank tender Neurological: Pt is alert. At baseline confused , motor grossly intact, with hearing improved after bilat wax impactions irrigated Skin: Skin is warm. No rash, no LE edema Psychiatric: Pt behavior is normal. No agitation.     Assessment & Plan:

## 2015-04-29 NOTE — Patient Instructions (Addendum)
Your ears were irrigated of wax today  Please let us know if you feel the need for ENT referral  Please continue all other medications as before, and refills have been done if requested.  Please have the pharmacy call with any other refills you may need.  Please continue your efforts at being more active, low cholesterol diet, and weight control.  Please keep your appointments with your specialists as you may have planned  Please go to the LAB in the Basement (turn left off the elevator) for the tests to be done today  You will be contacted by phone if any changes need to be made immediately.  Otherwise, you will receive a letter about your results with an explanation, but please check with MyChart first.  Please remember to sign up for MyChart if you have not done so, as this will be important to you in the future with finding out test results, communicating by private email, and scheduling acute appointments online when needed.  Please return in 6 months, or sooner if needed

## 2015-04-29 NOTE — Assessment & Plan Note (Signed)
Improved with irrigation, ok to cancel ENT referral,  to f/u any worsening symptoms or concerns

## 2015-05-02 ENCOUNTER — Encounter: Payer: Self-pay | Admitting: Nurse Practitioner

## 2015-05-02 ENCOUNTER — Ambulatory Visit (INDEPENDENT_AMBULATORY_CARE_PROVIDER_SITE_OTHER): Payer: Medicare Other | Admitting: Nurse Practitioner

## 2015-05-02 VITALS — BP 164/80 | HR 76 | Resp 18 | Wt 178.0 lb

## 2015-05-02 DIAGNOSIS — R269 Unspecified abnormalities of gait and mobility: Secondary | ICD-10-CM | POA: Diagnosis not present

## 2015-05-02 DIAGNOSIS — I63212 Cerebral infarction due to unspecified occlusion or stenosis of left vertebral arteries: Secondary | ICD-10-CM

## 2015-05-02 DIAGNOSIS — I6381 Other cerebral infarction due to occlusion or stenosis of small artery: Secondary | ICD-10-CM

## 2015-05-02 DIAGNOSIS — I480 Paroxysmal atrial fibrillation: Secondary | ICD-10-CM

## 2015-05-02 DIAGNOSIS — R531 Weakness: Secondary | ICD-10-CM

## 2015-05-02 DIAGNOSIS — I639 Cerebral infarction, unspecified: Secondary | ICD-10-CM

## 2015-05-02 DIAGNOSIS — I6322 Cerebral infarction due to unspecified occlusion or stenosis of basilar arteries: Secondary | ICD-10-CM | POA: Diagnosis not present

## 2015-05-02 DIAGNOSIS — M6289 Other specified disorders of muscle: Secondary | ICD-10-CM

## 2015-05-02 DIAGNOSIS — I10 Essential (primary) hypertension: Secondary | ICD-10-CM

## 2015-05-02 NOTE — Progress Notes (Signed)
I reviewed above note and agree with the assessment and plan.  Rosalin Hawking, MD PhD Stroke Neurology 05/02/2015 5:07 PM   Hi, Rose Sullivan, just want to double check that she is on eliquis BID. She was on eliquis DAILY before the hospitalization in March. We changed her dose to BID and discontinued ASA during the hospitalization. Hopefully, this can help her stroke prevention. Thanks much.  Rosalin Hawking, MD PhD Stroke Neurology 05/02/2015 5:09 PM

## 2015-05-02 NOTE — Progress Notes (Signed)
[facility]  PATIENT: Rose Sullivan DOB: [birth year]   REASON FOR VISIT: Hospital follow-up for stroke HISTORY FROM: Patient and daughter Rose Sullivan    HISTORY OF PRESENT ILLNESS: Rose Sullivan, 81 year old female presents for hospital follow-up with previous CVA with right-sided hemiparesis on 03/16/2015 who presented to the emergency room again on 04/15/2015 with worsening right-sided weakness and confusion for 24 hours. CT scan of the head revealed a new small left occipital infarct. She is on Eliquis 5 mg twice a day. She has a history of atrial fibrillation. MRI of the head 04/12/2015 with acute left posterior cerebral artery territory infarcts ,left occipital lobe left thalamus. Severe chronic small vessel ischemic disease. Stable moderate to severe global parenchymal brain volume loss. MRI of the head with chronically occluded left posterior cerebral artery. Stenotic right posterior cerebral artery. 2-D echo with 55-60% ejection fraction. She is currently receiving in home  physical therapy occupational therapy and speech therapy. She just had labs drawn at her primary care last week. In addition to her stroke on her last admission she also was dehydrated and had urinary tract infection She returns for reevaluation. Daughter has multiple questions.    REVIEW OF SYSTEMS: Full 14 system review of systems performed and notable only for those listed, all others are neg:  Constitutional: neg  Cardiovascular: neg Ear/Nose/Throat: neg  Skin: neg Eyes: neg Respiratory: neg Gastroitestinal: neg  Hematology/Lymphatic: neg  Endocrine: neg Musculoskeletal: Gait abnormality  Allergy/Immunology: neg Neurological: Stroke 2  Psychiatric: neg Sleep : neg   ALLERGIES: Allergies  Allergen Reactions  . Trazodone And Nefazodone Other (See Comments)    hallucinations    HOME MEDICATIONS: Outpatient Prescriptions Prior to Visit  Medication Sig Dispense Refill  .  apixaban (ELIQUIS) 5 MG TABS tablet Take 1 tablet (5 mg total) by mouth 2 (two) times daily. 60 tablet 0  . Diclofenac Sodium 1.5 % SOLN Place 2 mLs onto the skin 3 (three) times daily. 3 Bottle 3  . diltiazem (CARDIZEM CD) 240 MG 24 hr capsule Take 1 capsule (240 mg total) by mouth daily. 30 capsule 6  . exemestane (AROMASIN) 25 MG tablet Take 1 tablet (25 mg total) by mouth daily. 30 tablet 1  . megestrol (MEGACE) 40 MG/ML suspension TAKE 10 ML BY MOUTH EVERY DAY 240 mL 0  . metoprolol tartrate (LOPRESSOR) 25 MG tablet Take 0.5 tablets (12.5 mg total) by mouth 2 (two) times daily. 60 tablet 1  . oxyCODONE-acetaminophen (PERCOCET) 7.5-325 MG tablet Take 1 tablet by mouth every 8 (eight) hours as needed. 120 tablet 0  . pravastatin (PRAVACHOL) 40 MG tablet Take 1 tablet (40 mg total) by mouth daily at 6 PM. 30 tablet 0  . diclofenac sodium (VOLTAREN) 1 % GEL Reported on 05/02/2015  1   No facility-administered medications prior to visit.    PAST MEDICAL HISTORY: Past Medical History  Diagnosis Date  . GLUCOSE INTOLERANCE 02/11/2007  . HYPERLIPIDEMIA 02/11/2007  . BLINDNESS, [place], [place] DEFINITION 11/25/2006  . Other specified forms of hearing loss 09/05/2009  . HYPERTENSION 02/11/2007  . Atrial fibrillation ([place]) 03/07/2009  . GERD 02/11/2007  . LOW BACK PAIN, CHRONIC 11/25/2006  . BACK PAIN 12/10/2007  . Pain in Soft Tissues of Limb 02/11/2007  . OSTEOPENIA 02/11/2007  . FATIGUE 12/10/2007  . GAIT DISTURBANCE 12/10/2007  . DYSPNEA 03/18/2009  . Cough 03/07/2009  . CHEST PAIN 12/10/2007  . Abdominal pain, left lower quadrant 04/01/2007  . BREAST CANCER, HX OF 11/25/2006  .  Nocturia 05/18/2010  . Hypersomnia 05/18/2010  . Impaired glucose tolerance 05/18/2010  . Allergic rhinitis, cause unspecified 07/23/2012  . Anxiety state, unspecified 02/04/2013  . Cancer [facility])     PAST SURGICAL HISTORY: Past Surgical History  Procedure Laterality Date  . Dilation and curettage of uterus    . S/p mastectomy     . Appendectomy      FAMILY HISTORY: Family History  Problem Relation Age of Onset  . Hypertension Brother   . Lung cancer Brother   . Colon cancer Father   . COPD Brother   . Stroke Mother     SOCIAL HISTORY: Social History   Social History  . Marital Status: Widowed    Spouse Name: N/A  . Number of Children: 1  . Years of Education: N/A   Occupational History  .     Social History Main Topics  . Smoking status: Former Research scientist (life sciences)  . Smokeless tobacco: Never Used     Comment: quit 40 years ago  . Alcohol Use: No  . Drug Use: No  . Sexual Activity: Not on file   Other Topics Concern  . Not on file   Social History Narrative   Lives with daughter     PHYSICAL Cleotilde Neer Vitals:   05/02/15 1327  BP: 164/80  Pulse: 76  Resp: 18  Weight: 178 lb (80.74 kg)   Body mass index is 34.76 kg/(m^2).  Generalized: Well developed, obese female in no acute distress  Head: normocephalic and atraumatic,. Oropharynx benign  Neck: Supple, no carotid bruits  Cardiac: Regular rate rhythm, no murmur  Musculoskeletal: No deformity   Neurological examination   Mentation: Alert oriented to time, place, history taking. Attention span and concentration appropriate. Follows all commands speech mildly dysarthric    Cranial nerve II-XII: Fundoscopic exam deferred.Pupils were equal round reactive to light extraocular movements were full, visual field were full on confrontational test. Facial sensation and strength were normal. hearing was intact to finger rubbing bilaterally. Uvula tongue midline. head turning and shoulder shrug were normal and symmetric.Tongue protrusion into cheek strength was normal. Motor: normal bulk and tone, full strength in the BUE, BLE, on the left, right upper extremity 3 out of 5 distally 4 out of 5 proximally right lower extremity 4 out of 5 Sensory: normal and symmetric to light touch, pinprick, and  Vibration,  Coordination: finger-nose-finger,  no  dysmetria Reflexes: 1+ upper lower and symmetric, plantar responses were flexor bilaterally. Gait and Station: In wheelchair not ambulated   DIAGNOSTIC DATA (LABS, IMAGING, TESTING) - I reviewed patient records, labs, notes, testing and imaging myself where available.  Lab Results  Component Value Date   WBC 10.2 04/29/2015   HGB 15.0 04/29/2015   HCT 44.1 04/29/2015   MCV 91.4 04/29/2015   PLT 254.0 04/29/2015      Component Value Date/Time   NA 141 04/29/2015 1652   NA 142 04/01/2014 1519   K 4.1 04/29/2015 1652   K 4.0 04/01/2014 1519   CL 109 04/29/2015 1652   CL 107 04/29/2012 1138   CO2 24 04/29/2015 1652   CO2 24 04/01/2014 1519   GLUCOSE 131* 04/29/2015 1652   GLUCOSE 97 04/01/2014 1519   GLUCOSE 173* 04/29/2012 1138   GLUCOSE 113* 12/18/2005 1533   BUN 33* 04/29/2015 1652   BUN 23.1 04/01/2014 1519   CREATININE 1.34* 04/29/2015 1652   CREATININE 1.2* 04/01/2014 1519   CALCIUM 9.5 04/29/2015 1652   CALCIUM 9.3 04/01/2014 1519  PROT 7.5 04/29/2015 1652   PROT 6.7 04/01/2014 1519   ALBUMIN 4.0 04/29/2015 1652   ALBUMIN 3.6 04/01/2014 1519   AST 26 04/29/2015 1652   AST 23 04/01/2014 1519   ALT 18 04/29/2015 1652   ALT 14 04/01/2014 1519   ALKPHOS 68 04/29/2015 1652   ALKPHOS 67 04/01/2014 1519   BILITOT 1.0 04/29/2015 1652   BILITOT 0.74 04/01/2014 1519   GFRNONAA 34* 04/12/2015 0939   GFRAA 40* 04/12/2015 0939   Lab Results  Component Value Date   CHOL 90 04/29/2015   HDL 34.40* 04/29/2015   LDLCALC 37 04/29/2015   TRIG 93.0 04/29/2015   CHOLHDL 3 04/29/2015   Lab Results  Component Value Date   HGBA1C 5.7* 03/14/2015    ASSESSMENT AND PLAN  82 y.o. year old female here for hospital follow-up for acute left PCA territory infarcts extended from her left PCA stroke in February 2017 resulting in worsening of her right upper extremity weakness and lower extremity weakness.MRI of the head 04/12/2015 with acute left posterior cerebral artery  territory infarcts left occipital lobe left thalamus. Severe chronic small vessel ischemic disease. Stable moderate to severe global parenchymal brain volume loss. MRI of the head with chronically occluded left posterior cerebral artery. Stenotic right posterior cerebral artery. 2-D echo with 55-60% ejection fraction.   Management of stroke risk factors  Keep systolic blood pressure less than 130, continue B/P meds today's reading 164/80 Lipids are followed by PCP  LDL 112 goal less than 70 continue Pravachol Continue Eliquis  for atrial fibrillation and secondary stroke prevention Continue home rehabilitation for speech, physical therapy and occupational therapy and continue exercises after discharge Continue thick liquids for dysphagia Stay well hydrated to prevent dehydration Additional 10 minutes spent answering questions for daughter regarding stroke and next steps recovery period etc. Follow-up in 3 monthsVst time 30 min Dennie Bible, [facility], [facility], APRN  [facility] [address] [place], [place] [address] [phone]

## 2015-05-02 NOTE — Patient Instructions (Signed)
Keep systolic blood pressure less than 130, continue B/P meds Lipids are followed by PCP  LDL 112 goal less than 70 continue Pravachol Continue Eliquis  for atrial fibrillation and secondary stroke prevention Continue home rehabilitation and continue exercises after discharge Stay well hydrated to prevent dehydration Follow-up in 3 months

## 2015-05-03 ENCOUNTER — Other Ambulatory Visit: Payer: Self-pay | Admitting: Physical Medicine & Rehabilitation

## 2015-05-04 ENCOUNTER — Other Ambulatory Visit: Payer: Self-pay | Admitting: Physical Medicine & Rehabilitation

## 2015-05-11 DIAGNOSIS — I69351 Hemiplegia and hemiparesis following cerebral infarction affecting right dominant side: Secondary | ICD-10-CM

## 2015-05-11 DIAGNOSIS — I69311 Memory deficit following cerebral infarction: Secondary | ICD-10-CM

## 2015-05-11 DIAGNOSIS — I69391 Dysphagia following cerebral infarction: Secondary | ICD-10-CM | POA: Diagnosis not present

## 2015-05-11 DIAGNOSIS — R1312 Dysphagia, oropharyngeal phase: Secondary | ICD-10-CM | POA: Diagnosis not present

## 2015-05-16 ENCOUNTER — Telehealth: Payer: Self-pay | Admitting: Internal Medicine

## 2015-05-16 ENCOUNTER — Other Ambulatory Visit: Payer: Medicare Other

## 2015-05-16 ENCOUNTER — Telehealth: Payer: Self-pay

## 2015-05-16 DIAGNOSIS — R300 Dysuria: Secondary | ICD-10-CM

## 2015-05-16 NOTE — Telephone Encounter (Signed)
Rose Sullivan with [facility] is calling back regarding urine specimen. She states that the family is informing her that it is ok for her to collect the patients urine and send to [facility] for processing.

## 2015-05-16 NOTE — Telephone Encounter (Signed)
Rose Sullivan, pt's speech therapist states pt's daughter claims they were up about every 2 hours to go to the bathroom. There was usually little to no urination and there was some discomfort.  Wondering if she needs to be checked for uti. Pt's nurse from [facility] is Olivia Mackie and her number is [phone]

## 2015-05-16 NOTE — Telephone Encounter (Signed)
Patients home health nurse called and stated that patient was having painful urination and had barely any output when going, placed an order for her to go to the lab to give a urine specimen.

## 2015-05-19 ENCOUNTER — Telehealth: Payer: Self-pay | Admitting: Internal Medicine

## 2015-05-19 LAB — CULTURE, URINE COMPREHENSIVE

## 2015-05-19 MED ORDER — CIPROFLOXACIN HCL 500 MG PO TABS: 500.0000 mg | ORAL_TABLET | Freq: Two times a day (BID) | ORAL | Status: DC

## 2015-05-19 NOTE — Telephone Encounter (Signed)
Did urine specimen on patient a few days ago.  Requesting office to get back in touch with family in regards to results and if patient has UTI

## 2015-05-19 NOTE — Telephone Encounter (Signed)
Urine culture abnormal (positive)   OK for cipro course - done erx  Corinne to notify [facility] or family or both please

## 2015-05-25 ENCOUNTER — Telehealth: Payer: Self-pay | Admitting: Internal Medicine

## 2015-05-25 NOTE — Telephone Encounter (Signed)
FYI:  BP reading for today:  154/100 (1 hour after BP meds)

## 2015-05-25 NOTE — Telephone Encounter (Signed)
Please continue to monitor BP daily for 3days and call with results

## 2015-05-25 NOTE — Telephone Encounter (Signed)
Please advise 

## 2015-05-26 ENCOUNTER — Telehealth: Payer: Self-pay | Admitting: Internal Medicine

## 2015-05-26 NOTE — Telephone Encounter (Signed)
Unable to reach patient, left message to give [place] a call back.

## 2015-05-26 NOTE — Telephone Encounter (Signed)
PT called yesterday to report elevated BP.  Patient daughter states she got call this morning but did not get to phone in time.  Vitals for today are:  BP 142/80.  Please follow back up with patient in regards.

## 2015-05-29 ENCOUNTER — Other Ambulatory Visit: Payer: Self-pay | Admitting: Physical Medicine & Rehabilitation

## 2015-05-30 ENCOUNTER — Other Ambulatory Visit: Payer: Self-pay | Admitting: Internal Medicine

## 2015-06-02 ENCOUNTER — Telehealth: Payer: Self-pay | Admitting: Internal Medicine

## 2015-06-02 NOTE — Telephone Encounter (Signed)
Spoke to patient, her blood pressure was around 132, oxygen was ok, heart rate jumps to 130 when active. Home health nurse states that this is unusual for patient. Would like patient to be seen. Scheduled appointment.

## 2015-06-02 NOTE — Telephone Encounter (Signed)
[phone] Rose Sullivan , can you call her she is feeling like pt needs to be seen but Dr Jenny Reichmann does not have anything today or tomorrow.

## 2015-06-03 ENCOUNTER — Encounter: Payer: Medicare Other | Attending: Physical Medicine & Rehabilitation | Admitting: Registered Nurse

## 2015-06-03 ENCOUNTER — Encounter: Payer: Self-pay | Admitting: Registered Nurse

## 2015-06-03 VITALS — BP 133/65 | HR 86 | Resp 14

## 2015-06-03 DIAGNOSIS — M1712 Unilateral primary osteoarthritis, left knee: Secondary | ICD-10-CM

## 2015-06-03 DIAGNOSIS — M1711 Unilateral primary osteoarthritis, right knee: Secondary | ICD-10-CM | POA: Diagnosis not present

## 2015-06-03 DIAGNOSIS — I69359 Hemiplegia and hemiparesis following cerebral infarction affecting unspecified side: Secondary | ICD-10-CM | POA: Diagnosis not present

## 2015-06-03 DIAGNOSIS — I6322 Cerebral infarction due to unspecified occlusion or stenosis of basilar arteries: Secondary | ICD-10-CM

## 2015-06-03 DIAGNOSIS — Z79899 Other long term (current) drug therapy: Secondary | ICD-10-CM

## 2015-06-03 DIAGNOSIS — G894 Chronic pain syndrome: Secondary | ICD-10-CM | POA: Diagnosis present

## 2015-06-03 DIAGNOSIS — I639 Cerebral infarction, unspecified: Secondary | ICD-10-CM | POA: Diagnosis not present

## 2015-06-03 DIAGNOSIS — M47816 Spondylosis without myelopathy or radiculopathy, lumbar region: Secondary | ICD-10-CM

## 2015-06-03 DIAGNOSIS — M1611 Unilateral primary osteoarthritis, right hip: Secondary | ICD-10-CM | POA: Diagnosis not present

## 2015-06-03 DIAGNOSIS — I6381 Other cerebral infarction due to occlusion or stenosis of small artery: Secondary | ICD-10-CM

## 2015-06-03 DIAGNOSIS — I63212 Cerebral infarction due to unspecified occlusion or stenosis of left vertebral arteries: Secondary | ICD-10-CM | POA: Diagnosis not present

## 2015-06-03 DIAGNOSIS — Z5181 Encounter for therapeutic drug level monitoring: Secondary | ICD-10-CM | POA: Diagnosis present

## 2015-06-03 MED ORDER — OXYCODONE-ACETAMINOPHEN 7.5-325 MG PO TABS: 1.0000 | ORAL_TABLET | Freq: Three times a day (TID) | ORAL | Status: DC | PRN

## 2015-06-03 NOTE — Telephone Encounter (Signed)
OT left msg on triage stating spoke w/Corrine on yesterday concerning this pt. Did not get a call back stating what MD recommendation. Requesting to speak w/Corrine. Pls call her back at [phone]...Johny Chess

## 2015-06-03 NOTE — Progress Notes (Signed)
Subjective:    Patient ID: Rose Sullivan, female    DOB: [birth year], 82 y.o.   MRN: [id number]  HPI: [name] is a 82 year old female who returns for follow up for chronic pain and medication refill. She states her pain is located in her right hip and right knee. She rates her pain 5. Her current exercise regime she is receiving physical and occupational therapy twice a week and walking in her home with her walker with family.  Arrived in wheelchair. 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 She remains with cognitive difficulties  (short term memory), reviewed cognitive exercises with her daughter she verbalizes understanding. She was given three items to remember , she needed clues to recite the items.  Daughter in room all questions answered   Pain Inventory Average Pain 5 Pain Right Now 5 My pain is intermittent  In the last 24 hours, has pain interfered with the following? General activity 5 Relation with others 5 Enjoyment of life 5 What TIME of day is your pain at its worst? morning, evening  Sleep (in general) Good  Pain is worse with: walking and some activites Pain improves with: medication Relief from Meds: 6  Mobility walk with assistance use a cane how many minutes can you walk? 1 ability to climb steps?  no do you drive?  no use a wheelchair Do you have any goals in this area?  yes  Function retired  Neuro/Psych No problems in this area  Prior Studies Any changes since last visit?  no  Physicians involved in your care Any changes since last visit?  no   Family History  Problem Relation Age of Onset  . Hypertension Brother   . Lung cancer Brother   . Colon cancer Father   . COPD Brother   . Stroke Mother    Social History   Social History  . Marital Status: Widowed    Spouse Name: N/A  . Number of Children: 1  . Years of Education: N/A    Occupational History  .     Social History Main Topics  . Smoking status: Former Research scientist (life sciences)  . Smokeless tobacco: Never Used     Comment: quit 40 years ago  . Alcohol Use: No  . Drug Use: No  . Sexual Activity: Not Asked   Other Topics Concern  . None   Social History Narrative   Lives with daughter   Past Surgical History  Procedure Laterality Date  . Dilation and curettage of uterus    . S/p mastectomy    . Appendectomy     Past Medical History  Diagnosis Date  . GLUCOSE INTOLERANCE 02/11/2007  . HYPERLIPIDEMIA 02/11/2007  . BLINDNESS, [place], [place] DEFINITION 11/25/2006  . Other specified forms of hearing loss 09/05/2009  . HYPERTENSION 02/11/2007  . Atrial fibrillation ([place]) 03/07/2009  . GERD 02/11/2007  . LOW BACK PAIN, CHRONIC 11/25/2006  . BACK PAIN 12/10/2007  . Pain in Soft Tissues of Limb 02/11/2007  . OSTEOPENIA 02/11/2007  . FATIGUE 12/10/2007  . GAIT DISTURBANCE 12/10/2007  . DYSPNEA 03/18/2009  . Cough 03/07/2009  . CHEST PAIN 12/10/2007  . Abdominal pain, left lower quadrant 04/01/2007  . BREAST CANCER, HX OF 11/25/2006  . Nocturia 05/18/2010  . Hypersomnia 05/18/2010  . Impaired glucose tolerance 05/18/2010  . Allergic rhinitis, cause unspecified 07/23/2012  . Anxiety state, unspecified 02/04/2013  .  Cancer (HCC)    BP 133/65 mmHg  Pulse 86  Resp 14  SpO2 99%  Opioid Risk Score:   Fall Risk Score:  `1  Depression screen PHQ 2/9  Depression screen [facility] 2/9 04/05/2015 02/25/2015 10/20/2014 08/18/2014 04/13/2014 08/05/2013  Decreased Interest 3 0 1 0 2 0  Down, Depressed, Hopeless 0 0 0 0 0 1  PHQ - 2 Score 3 0 1 0 2 1  Altered sleeping 2 - - - 2 -  Tired, decreased energy 3 - - - 3 -  Change in appetite 3 - - - 0 -  Feeling bad or failure about yourself  1 - - - 0 -  Trouble concentrating 2 - - - 0 -  Moving slowly or fidgety/restless 1 - - - 0 -  Suicidal thoughts 0 - - - 0 -  PHQ-9 Score 15 - - - 7 -  Difficult doing work/chores Extremely dIfficult - - - - -      Review of Systems  All other systems reviewed and are negative.      Objective:   Physical Exam  Constitutional: She appears well-developed and well-nourished.  HENT:  Head: Normocephalic and atraumatic.  Neck: Normal range of motion. Neck supple.  Cardiovascular: Normal rate and regular rhythm.   Pulmonary/Chest: Effort normal and breath sounds normal.  Musculoskeletal:  Normal Muscle Bulk and Muscle Testing Reveals: Upper Extremities: Full ROM and Muscle Strength 4/5 Thoracic Paraspinal Tenderness: T-10- T-11 Right Greater Trochanteric Tenderness Lower Extremities: Full ROM and Muscle Strength 5/5 Arrived in wheelchair  Neurological: She is alert.  Oriented X2   Skin: Skin is warm and dry.  Psychiatric: She has a normal mood and affect.  Nursing note and vitals reviewed.         Assessment & Plan:  1. Altered Mental Status/ S/P Left Thalamic and Left Mesial Temporal Lobe Infarct: Continue Home Physical, Occupational and Speech Therapy. 2.. Osteoarthritis of bilateral knees, right greater than left.  Refilled: Oxycodone-acetaminophen 7.5/325mg  one tablet every 8 hours as needed. May take an extra tablet when pain is sever. No more than 4 a day #120. Second script given for the following month. We will continue the opioid monitoring program, this consists of regular clinic visits, examinations, urine drug screen, pill counts as well as use of [place] Controlled Substance Reporting System. 3. Osteoarthritis right hip: Continue Voltaren gel four times a day. Continue heat and exercise  3. Facet arthropathy of lumbar and cervical spine : Continue current medication regime. Continue to monitor 4. Peripheral polyneuropathy, quite possibly secondary to past chemotherapy: Continue to monitor. 5. Morbid obesity : Encouraged to increase activity, and start chair exercises. 6. Legally blind: Continue to monitor. Daughter with patient  30 minutes of face to face  patient care time was spent during this visit. All questions were encouraged and answered.  F/U in 2 month

## 2015-06-03 NOTE — Telephone Encounter (Signed)
Unable to reach left message to give [place] a call back

## 2015-06-05 ENCOUNTER — Other Ambulatory Visit: Payer: Self-pay | Admitting: Physical Medicine & Rehabilitation

## 2015-06-07 ENCOUNTER — Telehealth: Payer: Self-pay

## 2015-06-07 ENCOUNTER — Other Ambulatory Visit: Payer: Self-pay | Admitting: Internal Medicine

## 2015-06-07 ENCOUNTER — Other Ambulatory Visit: Payer: Medicare Other

## 2015-06-07 DIAGNOSIS — R309 Painful micturition, unspecified: Secondary | ICD-10-CM | POA: Diagnosis not present

## 2015-06-07 NOTE — Telephone Encounter (Signed)
Urine culture placed.

## 2015-06-08 LAB — MICROALBUMIN / CREATININE URINE RATIO
CREATININE, U: 446.7 mg/dL
MICROALB UR: 8.4 mg/dL — AB (ref 0.0–1.9)
MICROALB/CREAT RATIO: 1.9 mg/g (ref 0.0–30.0)

## 2015-06-09 ENCOUNTER — Telehealth: Payer: Self-pay | Admitting: Internal Medicine

## 2015-06-09 NOTE — Telephone Encounter (Signed)
i have not seen any faxed UA results

## 2015-06-09 NOTE — Telephone Encounter (Signed)
Spoke to Diane OT gave verbal and let her know that once we get results we will give them a call

## 2015-06-09 NOTE — Telephone Encounter (Signed)
Requesting results of urinalysis.

## 2015-06-09 NOTE — Telephone Encounter (Signed)
Left msg on triage yesterday afternoon requesting UA results that was faxed over. Also wanting to get verbal order for speech eval for cognition increase memory loss/speech...Johny Chess

## 2015-06-10 ENCOUNTER — Other Ambulatory Visit (INDEPENDENT_AMBULATORY_CARE_PROVIDER_SITE_OTHER): Payer: Medicare Other

## 2015-06-10 ENCOUNTER — Encounter (HOSPITAL_COMMUNITY): Payer: Self-pay | Admitting: *Deleted

## 2015-06-10 ENCOUNTER — Ambulatory Visit (INDEPENDENT_AMBULATORY_CARE_PROVIDER_SITE_OTHER): Payer: Medicare Other | Admitting: Internal Medicine

## 2015-06-10 ENCOUNTER — Encounter: Payer: Self-pay | Admitting: Internal Medicine

## 2015-06-10 ENCOUNTER — Emergency Department (HOSPITAL_COMMUNITY)
Admission: EM | Admit: 2015-06-10 | Discharge: 2015-06-10 | Disposition: A | Discharge status: Home / Self Care | Payer: Medicare Other | Attending: Emergency Medicine | Admitting: Emergency Medicine

## 2015-06-10 ENCOUNTER — Emergency Department (HOSPITAL_COMMUNITY): Payer: Medicare Other

## 2015-06-10 VITALS — BP 100/60 | HR 71 | Temp 97.8°F | Ht 60.0 in | Wt 176.5 lb

## 2015-06-10 DIAGNOSIS — Z87891 Personal history of nicotine dependence: Secondary | ICD-10-CM | POA: Insufficient documentation

## 2015-06-10 DIAGNOSIS — Z79899 Other long term (current) drug therapy: Secondary | ICD-10-CM | POA: Insufficient documentation

## 2015-06-10 DIAGNOSIS — I6381 Other cerebral infarction due to occlusion or stenosis of small artery: Secondary | ICD-10-CM

## 2015-06-10 DIAGNOSIS — I480 Paroxysmal atrial fibrillation: Secondary | ICD-10-CM

## 2015-06-10 DIAGNOSIS — J9601 Acute respiratory failure with hypoxia: Secondary | ICD-10-CM | POA: Diagnosis not present

## 2015-06-10 DIAGNOSIS — G8929 Other chronic pain: Secondary | ICD-10-CM | POA: Diagnosis not present

## 2015-06-10 DIAGNOSIS — I63212 Cerebral infarction due to unspecified occlusion or stenosis of left vertebral arteries: Secondary | ICD-10-CM

## 2015-06-10 DIAGNOSIS — I6322 Cerebral infarction due to unspecified occlusion or stenosis of basilar arteries: Secondary | ICD-10-CM

## 2015-06-10 DIAGNOSIS — H548 Legal blindness, as defined in USA: Secondary | ICD-10-CM | POA: Insufficient documentation

## 2015-06-10 DIAGNOSIS — Z8719 Personal history of other diseases of the digestive system: Secondary | ICD-10-CM | POA: Insufficient documentation

## 2015-06-10 DIAGNOSIS — F0390 Unspecified dementia without behavioral disturbance: Secondary | ICD-10-CM | POA: Insufficient documentation

## 2015-06-10 DIAGNOSIS — I10 Essential (primary) hypertension: Secondary | ICD-10-CM | POA: Diagnosis not present

## 2015-06-10 DIAGNOSIS — N390 Urinary tract infection, site not specified: Secondary | ICD-10-CM | POA: Diagnosis not present

## 2015-06-10 DIAGNOSIS — R0600 Dyspnea, unspecified: Secondary | ICD-10-CM | POA: Insufficient documentation

## 2015-06-10 DIAGNOSIS — E785 Hyperlipidemia, unspecified: Secondary | ICD-10-CM | POA: Insufficient documentation

## 2015-06-10 DIAGNOSIS — R0602 Shortness of breath: Secondary | ICD-10-CM

## 2015-06-10 DIAGNOSIS — Z8739 Personal history of other diseases of the musculoskeletal system and connective tissue: Secondary | ICD-10-CM | POA: Diagnosis not present

## 2015-06-10 DIAGNOSIS — R0609 Other forms of dyspnea: Secondary | ICD-10-CM

## 2015-06-10 DIAGNOSIS — I4891 Unspecified atrial fibrillation: Secondary | ICD-10-CM | POA: Diagnosis not present

## 2015-06-10 DIAGNOSIS — Z853 Personal history of malignant neoplasm of breast: Secondary | ICD-10-CM | POA: Insufficient documentation

## 2015-06-10 DIAGNOSIS — I639 Cerebral infarction, unspecified: Secondary | ICD-10-CM | POA: Diagnosis not present

## 2015-06-10 LAB — POCT URINALYSIS DIPSTICK
Bilirubin, UA: POSITIVE
GLUCOSE UA: NEGATIVE
Ketones, UA: POSITIVE
Nitrite, UA: POSITIVE
PROTEIN UA: POSITIVE
RBC UA: POSITIVE
SPEC GRAV UA: 1.025
UROBILINOGEN UA: 1
pH, UA: 5.5

## 2015-06-10 LAB — URINALYSIS, ROUTINE W REFLEX MICROSCOPIC
NITRITE: POSITIVE — AB
Specific Gravity, Urine: >=1.03 — AB (ref 1.000–1.030)
Total Protein, Urine: 100 — AB
Urine Glucose: NEGATIVE
Urobilinogen, UA: 1 (ref 0.0–1.0)
pH: 5 (ref 5.0–8.0)

## 2015-06-10 LAB — CBC
HCT: 41.4 % (ref 36.0–46.0)
Hemoglobin: 13.4 g/dL (ref 12.0–15.0)
MCH: 30.5 pg (ref 26.0–34.0)
MCHC: 32.4 g/dL (ref 30.0–36.0)
MCV: 94.3 fL (ref 78.0–100.0)
PLATELETS: 281 10*3/uL (ref 150–400)
RBC: 4.39 MIL/uL (ref 3.87–5.11)
RDW: 14.1 % (ref 11.5–15.5)
WBC: 8.6 10*3/uL (ref 4.0–10.5)

## 2015-06-10 LAB — I-STAT TROPONIN, ED: TROPONIN I, POC: 0.01 ng/mL (ref 0.00–0.08)

## 2015-06-10 LAB — BASIC METABOLIC PANEL
Anion gap: 14 (ref 5–15)
BUN: 24 mg/dL — ABNORMAL HIGH (ref 6–20)
CALCIUM: 9.2 mg/dL (ref 8.9–10.3)
CO2: 20 mmol/L — AB (ref 22–32)
CREATININE: 1.49 mg/dL — AB (ref 0.44–1.00)
Chloride: 112 mmol/L — ABNORMAL HIGH (ref 101–111)
GFR calc non Af Amer: 32 mL/min — ABNORMAL LOW (ref >60)
GFR, EST AFRICAN AMERICAN: 37 mL/min — AB (ref >60)
GLUCOSE: 108 mg/dL — AB (ref 65–99)
Potassium: 4 mmol/L (ref 3.5–5.1)
Sodium: 146 mmol/L — ABNORMAL HIGH (ref 135–145)

## 2015-06-10 MED ORDER — SULFAMETHOXAZOLE-TRIMETHOPRIM 800-160 MG PO TABS: 1.0000 | ORAL_TABLET | Freq: Two times a day (BID) | ORAL | Status: DC

## 2015-06-10 MED ORDER — CEPHALEXIN 250 MG PO CAPS: ORAL_CAPSULE | ORAL | Status: DC

## 2015-06-10 MED ORDER — SODIUM CHLORIDE 0.9 % IV BOLUS (SEPSIS)
500.0000 mL | Freq: Once | INTRAVENOUS | Status: AC
  Administered 2015-06-10: 500 mL via INTRAVENOUS

## 2015-06-10 MED ORDER — DEXTROSE 5 % IV SOLN
1.0000 g | Freq: Once | INTRAVENOUS | Status: AC
  Administered 2015-06-10: 1 g via INTRAVENOUS
  Filled 2015-06-10: qty 10

## 2015-06-10 NOTE — ED Provider Notes (Signed)
CSN: [id number]     Arrival date & time 06/10/15  1657 History   First MD Initiated Contact with Patient 06/10/15 1839     Chief Complaint  Patient presents with  . Shortness of Breath    Patient is a 82 y.o. female presenting with shortness of breath. The history is provided by the patient and a relative. No language interpreter was used.  Shortness of Breath  Rose Sullivan is a 82 y.o. female who presents to the Emergency Department complaining of SOB. Level V caveat due to confusion.  Hx is provided by the patient's daughter.  Daughter reports one week of increased intermittent confusion and DOE.  Pt has a hx/o CVA and was d/cd home from the hospital a month ago following CVA and UTI.  The patient had a similar presentation when she was admitted to the hospital a month ago.  She has at baseline some speech difficulties, right sided upper and lower extremity weakness and intermittent confusion.  She lives at home with her daughter and has home health out for one hour per day.  She is dependent on family for most ADLs.  She was noted to have a UTI on office visit today and written for abx but referred to the ED due to ambulatory sats of 87%.  No reports of fevers, chest pain, vomiting, abdominal pain, diarrhea. Pt reports denies any current complaints or pain but does endorse SOB at times.    Past Medical History  Diagnosis Date  . GLUCOSE INTOLERANCE 02/11/2007  . HYPERLIPIDEMIA 02/11/2007  . BLINDNESS, [place], [place] DEFINITION 11/25/2006  . Other specified forms of hearing loss 09/05/2009  . HYPERTENSION 02/11/2007  . Atrial fibrillation ([place]) 03/07/2009  . GERD 02/11/2007  . LOW BACK PAIN, CHRONIC 11/25/2006  . BACK PAIN 12/10/2007  . Pain in Soft Tissues of Limb 02/11/2007  . OSTEOPENIA 02/11/2007  . FATIGUE 12/10/2007  . GAIT DISTURBANCE 12/10/2007  . DYSPNEA 03/18/2009  . Cough 03/07/2009  . CHEST PAIN 12/10/2007  . Abdominal pain, left lower quadrant 04/01/2007  . BREAST CANCER, HX OF  11/25/2006  . Nocturia 05/18/2010  . Hypersomnia 05/18/2010  . Impaired glucose tolerance 05/18/2010  . Allergic rhinitis, cause unspecified 07/23/2012  . Anxiety state, unspecified 02/04/2013  . Cancer [facility])    Past Surgical History  Procedure Laterality Date  . Dilation and curettage of uterus    . S/p mastectomy    . Appendectomy     Family History  Problem Relation Age of Onset  . Hypertension Brother   . Lung cancer Brother   . Colon cancer Father   . COPD Brother   . Stroke Mother    Social History  Substance Use Topics  . Smoking status: Former Research scientist (life sciences)  . Smokeless tobacco: Never Used     Comment: quit 40 years ago  . Alcohol Use: No   OB History    No data available     Review of Systems  Unable to perform ROS: Dementia  Respiratory: Positive for shortness of breath.       Allergies  Trazodone and nefazodone  Home Medications   Prior to Admission medications   Medication Sig Start Date End Date Taking? Authorizing Provider  cephALEXin (KEFLEX) 250 MG capsule 1 tab by mouth on Mon- Wed- Fri 06/10/15   Biagio Borg, MD  diclofenac sodium (VOLTAREN) 1 % GEL Reported on 05/02/2015 04/04/15   Historical Provider, MD  Diclofenac Sodium 1.5 % SOLN Place 2 mLs onto  the skin 3 (three) times daily. 04/05/15   Meredith Staggers, MD  diltiazem (CARDIZEM CD) 240 MG 24 hr capsule Take 1 capsule (240 mg total) by mouth daily. 03/28/15   Lavon Paganini Angiulli, PA-C  ELIQUIS 5 MG TABS tablet TAKE 1 TABLET BY MOUTH TWICE A DAY 06/07/15   Biagio Borg, MD  exemestane (AROMASIN) 25 MG tablet TAKE 1 TABLET BY MOUTH EVERY DAY 05/31/15   Biagio Borg, MD  megestrol (MEGACE) 40 MG/ML suspension TAKE 10 ML BY MOUTH EVERY DAY 05/02/15   Meredith Staggers, MD  metoprolol tartrate (LOPRESSOR) 25 MG tablet Take 0.5 tablets (12.5 mg total) by mouth 2 (two) times daily. 03/28/15   Lavon Paganini Angiulli, PA-C  oxyCODONE-acetaminophen (PERCOCET) 7.5-325 MG tablet Take 1 tablet by mouth every 8 (eight) hours as needed.  06/03/15   Bayard Hugger, NP  pravastatin (PRAVACHOL) 40 MG tablet TAKE 1 TABLET BY MOUTH EVERY DAY AT 6 PM 06/07/15   Biagio Borg, MD  sulfamethoxazole-trimethoprim (BACTRIM DS,SEPTRA DS) 800-160 MG tablet Take 1 tablet by mouth 2 (two) times daily. 06/10/15   Biagio Borg, MD   BP 154/80 mmHg  Pulse 90  Temp(Src) 97.7 F (36.5 C) (Oral)  Resp 23  SpO2 99% Physical Exam  Constitutional: She appears well-developed and well-nourished.  HENT:  Head: Normocephalic and atraumatic.  Dry mucous membranes  Cardiovascular: Normal rate and regular rhythm.   No murmur heard. Pulmonary/Chest: Effort normal and breath sounds normal. No respiratory distress.  Abdominal: Soft. There is no tenderness. There is no rebound and no guarding.  Musculoskeletal: She exhibits no edema or tenderness.  Neurological: She is alert.  Disoriented to time right sided weakness and mild confusion.  Slow to answer questions.    Skin: Skin is warm and dry.  Psychiatric: She has a normal mood and affect. Her behavior is normal.  Nursing note and vitals reviewed.   ED Course  Procedures (including critical care time) Labs Review Labs Reviewed  BASIC METABOLIC PANEL - Abnormal; Notable for the following:    Sodium 146 (*)    Chloride 112 (*)    CO2 20 (*)    Glucose, Bld 108 (*)    BUN 24 (*)    Creatinine, Ser 1.49 (*)    GFR calc non Af Amer 32 (*)    GFR calc Af Amer 37 (*)    All other components within normal limits  CBC  I-STAT TROPOININ, ED    Imaging Review Dg Chest 1 View  06/10/2015  CLINICAL DATA:  Per family, pt having sob x 2 weeks. Also recently diagnosed and treated for UTI. Only one view chest done, pt in wheelchair without removable arms and could not stand for images. EXAM: CHEST 1 VIEW COMPARISON:  03/13/2015 FINDINGS: Heart size is within normal limits. Small right pleural effusion is present. There are no focal consolidations. No pulmonary edema. IMPRESSION: Small right pleural effusion.  Electronically Signed   By: Nolon Nations M.D.   On: 06/10/2015 18:08   I have personally reviewed and evaluated these images and lab results as part of my medical decision-making.   EKG Interpretation   Date/Time:  Friday Jun 10 2015 17:08:26 EDT Ventricular Rate:  68 PR Interval:  154 QRS Duration: 82 QT Interval:  396 QTC Calculation: 421 R Axis:   72 Text Interpretation:  Normal sinus rhythm Low voltage QRS Nonspecific T  wave abnormality Abnormal ECG Confirmed by Hazle Coca [id number]) on 06/10/2015  5:09:10 PM      MDM   Final diagnoses:  Acute UTI  DOE (dyspnea on exertion)    Pt here with waxing and waning increased confusion over the last week, mild DOE.  Outpatient UA that was obtained today is c/w UTI and PCP initiated abx.  In office she had DOE with hypoxia.  In ED pt has DOE over long distance, no hypoxia.  CXR with small pleural effusion - no evidence of CHF or pna.  Presentation is not c/w recurrent CVA, PE.  BMP with mild dehydration - provided small IV fluid bolus.  Plan to d/c home with close outpatient follow up.      Quintella Reichert, MD 06/10/15 [phone]

## 2015-06-10 NOTE — Progress Notes (Signed)
Pre visit review using our clinic review tool, if applicable. No additional management support is needed unless otherwise documented below in the visit note. 

## 2015-06-10 NOTE — Assessment & Plan Note (Signed)
For urine cx, empiric septra course,  to f/u any worsening symptoms or concerns

## 2015-06-10 NOTE — Telephone Encounter (Signed)
Microalbumin- urine order was placed instead of UA. Pt in today (06/10/2015) for OV - BP follow and wanted the UA results from 06/07/2015 collection.   POC UA was done during OV.

## 2015-06-10 NOTE — ED Notes (Signed)
Per family, pt having sob x 2 weeks. Also recently diagnosed and treated for UTI. Denies recent cough. Was started on home o2 due to low spo2, became more sob with mild exertion. spo2 100% on room air at triage. ekg done.

## 2015-06-10 NOTE — ED Notes (Signed)
Ambulated patient in the hall way with walker with assistance from family patients oxygen remained between 90-98%, the patients heart rate went up to 105, patient seems short of breath on exertion and said she also feels increasingly short of breath with ambulation

## 2015-06-10 NOTE — Progress Notes (Signed)
Subjective:    Patient ID: Rose Sullivan, female    DOB: 1933/12/02, 82 y.o.   MRN: [id number]  HPI  Here to f/u with daughter concerned about worsening (or maybe never got better) confusion since last tx for UTI apr 20, seems to have some urinary frequency persistent (or maybe not better?) despite good compliacne with cipro for klebsiella UTI (cipro sensitive);  Udip + in office today for + leu est. No fever.  Daughter also mentions home nursing concern about 2 wks DOE unusual for her, without fever, cough, apparent chest pain or wheezing.  O2 sat in office today with walking only 10 ft is 87% on RA, then a few minutes later 98% at rest (on RA).   Pt denies new neurological symptoms such as new headache, or facial or extremity weakness or numbness, over baseline hemiparesis. Does not want to return to [facility] Neurology, did not click with prior provider  Also asks for cardiology referral in f/u as well. Past Medical History  Diagnosis Date  . GLUCOSE INTOLERANCE 02/11/2007  . HYPERLIPIDEMIA 02/11/2007  . BLINDNESS, [place], [place] DEFINITION 11/25/2006  . Other specified forms of hearing loss 09/05/2009  . HYPERTENSION 02/11/2007  . Atrial fibrillation ([place]) 03/07/2009  . GERD 02/11/2007  . LOW BACK PAIN, CHRONIC 11/25/2006  . BACK PAIN 12/10/2007  . Pain in Soft Tissues of Limb 02/11/2007  . OSTEOPENIA 02/11/2007  . FATIGUE 12/10/2007  . GAIT DISTURBANCE 12/10/2007  . DYSPNEA 03/18/2009  . Cough 03/07/2009  . CHEST PAIN 12/10/2007  . Abdominal pain, left lower quadrant 04/01/2007  . BREAST CANCER, HX OF 11/25/2006  . Nocturia 05/18/2010  . Hypersomnia 05/18/2010  . Impaired glucose tolerance 05/18/2010  . Allergic rhinitis, cause unspecified 07/23/2012  . Anxiety state, unspecified 02/04/2013  . Cancer [facility])    Past Surgical History  Procedure Laterality Date  . Dilation and curettage of uterus    . S/p mastectomy    . Appendectomy      reports that she has quit smoking. She has never used  smokeless tobacco. She reports that she does not drink alcohol or use illicit drugs. family history includes COPD in her brother; Colon cancer in her father; Hypertension in her brother; Lung cancer in her brother; Stroke in her mother. Allergies  Allergen Reactions  . Trazodone And Nefazodone Other (See Comments)    hallucinations   Current Outpatient Prescriptions on File Prior to Visit  Medication Sig Dispense Refill  . diclofenac sodium (VOLTAREN) 1 % GEL Reported on 05/02/2015  1  . Diclofenac Sodium 1.5 % SOLN Place 2 mLs onto the skin 3 (three) times daily. 3 Bottle 3  . diltiazem (CARDIZEM CD) 240 MG 24 hr capsule Take 1 capsule (240 mg total) by mouth daily. 30 capsule 6  . ELIQUIS 5 MG TABS tablet TAKE 1 TABLET BY MOUTH TWICE A DAY 60 tablet 0  . exemestane (AROMASIN) 25 MG tablet TAKE 1 TABLET BY MOUTH EVERY DAY 30 tablet 1  . megestrol (MEGACE) 40 MG/ML suspension TAKE 10 ML BY MOUTH EVERY DAY 240 mL 0  . metoprolol tartrate (LOPRESSOR) 25 MG tablet Take 0.5 tablets (12.5 mg total) by mouth 2 (two) times daily. 60 tablet 1  . oxyCODONE-acetaminophen (PERCOCET) 7.5-325 MG tablet Take 1 tablet by mouth every 8 (eight) hours as needed. 120 tablet 0  . pravastatin (PRAVACHOL) 40 MG tablet TAKE 1 TABLET BY MOUTH EVERY DAY AT 6 PM 30 tablet 0   No current facility-administered medications  on file prior to visit.    Review of Systems  Constitutional: Negative for unusual diaphoresis or night sweats HENT: Negative for ear swelling or discharge Eyes: Negative for worsening visual haziness  Respiratory: Negative for choking and stridor.   Gastrointestinal: Negative for distension or worsening eructation Genitourinary: Negative for retention or change in urine volume.  Musculoskeletal: Negative for other MSK pain or swelling Skin: Negative for color change and worsening wound Neurological: Negative for tremors and numbness other than noted  Psychiatric/Behavioral: Negative for  decreased concentration or agitation other than above       Objective:   Physical Exam BP 100/60 mmHg  Pulse 71  Temp(Src) 97.8 F (36.6 C) (Oral)  Ht 5' (1.524 m)  Wt 176 lb 8 oz (80.06 kg)  BMI 34.47 kg/m2  SpO2 98% Ambulatory o2 sat; 87% on RA VS noted, non toxic Constitutional: Pt appears in no apparent distress HENT: Head: NCAT.  Right Ear: External ear normal.  Left Ear: External ear normal.  Eyes: . Pupils are equal, round, and reactive to light. Conjunctivae and EOM are normal Neck: Normal range of motion. Neck supple.  Cardiovascular: Normal rate and regular rhythm.   Pulmonary/Chest: Effort normal and breath sounds decreased without rales or wheezing.  Abd:  Soft, NT, ND, + BS Neurological: Pt is alert. + confused , motor grossly intact Skin: Skin is warm. No rash, no LE edema Psychiatric: Pt behavior is normal. No agitation. Fearful, does not want to go to ER      Assessment & Plan:

## 2015-06-10 NOTE — Assessment & Plan Note (Signed)
New finding with exertional hypoxemia, etiology unclear, no known prior hx, will need cxr and labs  - d/w daughter I could do these and ask for home o2 per [facility] as outpt but would take several days, and best course I feel is for more urgent evaluation at ED to determine possible etiology

## 2015-06-10 NOTE — Discharge Instructions (Signed)
Shortness of Breath Shortness of breath means you have trouble breathing. It could also mean that you have a medical problem. You should get immediate medical care for shortness of breath. CAUSES   Not enough oxygen in the air such as with high altitudes or a smoke-filled room.  Certain lung diseases, infections, or problems.  Heart disease or conditions, such as angina or heart failure.  Low red blood cells (anemia).  Poor physical fitness, which can cause shortness of breath when you exercise.  Chest or back injuries or stiffness.  Being overweight.  Smoking.  Anxiety, which can make you feel like you are not getting enough air. DIAGNOSIS  Serious medical problems can often be found during your physical exam. Tests may also be done to determine why you are having shortness of breath. Tests may include:  Chest X-rays.  Lung function tests.  Blood tests.  An electrocardiogram (ECG).  An ambulatory electrocardiogram. An ambulatory ECG records your heartbeat patterns over a 24-hour period.  Exercise testing.  A transthoracic echocardiogram (TTE). During echocardiography, sound waves are used to evaluate how blood flows through your heart.  A transesophageal echocardiogram (TEE).  Imaging scans. Your health care provider may not be able to find a cause for your shortness of breath after your exam. In this case, it is important to have a follow-up exam with your health care provider as directed.  TREATMENT  Treatment for shortness of breath depends on the cause of your symptoms and can vary greatly. HOME CARE INSTRUCTIONS   Do not smoke. Smoking is a common cause of shortness of breath. If you smoke, ask for help to quit.  Avoid being around chemicals or things that may bother your breathing, such as paint fumes and dust.  Rest as needed. Slowly resume your usual activities.  If medicines were prescribed, take them as directed for the full length of time directed. This  includes oxygen and any inhaled medicines.  Keep all follow-up appointments as directed by your health care provider. SEEK MEDICAL CARE IF:   Your condition does not improve in the time expected.  You have a hard time doing your normal activities even with rest.  You have any new symptoms. SEEK IMMEDIATE MEDICAL CARE IF:   Your shortness of breath gets worse.  You feel light-headed, faint, or develop a cough not controlled with medicines.  You start coughing up blood.  You have pain with breathing.  You have chest pain or pain in your arms, shoulders, or abdomen.  You have a fever.  You are unable to walk up stairs or exercise the way you normally do. MAKE SURE YOU:  Understand these instructions.  Will watch your condition.  Will get help right away if you are not doing well or get worse.   This information is not intended to replace advice given to you by your health care provider. Make sure you discuss any questions you have with your health care provider.   Document Released: 10/10/2000 Document Revised: 01/20/2013 Document Reviewed: 04/02/2011 Elsevier Interactive Patient Education 2016 Elsevier Inc.  Urinary Tract Infection Urinary tract infections (UTIs) can develop anywhere along your urinary tract. Your urinary tract is your body's drainage system for removing wastes and extra water. Your urinary tract includes two kidneys, two ureters, a bladder, and a urethra. Your kidneys are a pair of bean-shaped organs. Each kidney is about the size of your fist. They are located below your ribs, one on each side of your spine. CAUSES  Infections are caused by microbes, which are microscopic organisms, including fungi, viruses, and bacteria. These organisms are so small that they can only be seen through a microscope. Bacteria are the microbes that most commonly cause UTIs. SYMPTOMS  Symptoms of UTIs may vary by age and gender of the patient and by the location of the  infection. Symptoms in young women typically include a frequent and intense urge to urinate and a painful, burning feeling in the bladder or urethra during urination. Older women and men are more likely to be tired, shaky, and weak and have muscle aches and abdominal pain. A fever may mean the infection is in your kidneys. Other symptoms of a kidney infection include pain in your back or sides below the ribs, nausea, and vomiting. DIAGNOSIS To diagnose a UTI, your caregiver will ask you about your symptoms. Your caregiver will also ask you to provide a urine sample. The urine sample will be tested for bacteria and white blood cells. White blood cells are made by your body to help fight infection. TREATMENT  Typically, UTIs can be treated with medication. Because most UTIs are caused by a bacterial infection, they usually can be treated with the use of antibiotics. The choice of antibiotic and length of treatment depend on your symptoms and the type of bacteria causing your infection. HOME CARE INSTRUCTIONS  If you were prescribed antibiotics, take them exactly as your caregiver instructs you. Finish the medication even if you feel better after you have only taken some of the medication.  Drink enough water and fluids to keep your urine clear or pale yellow.  Avoid caffeine, tea, and carbonated beverages. They tend to irritate your bladder.  Empty your bladder often. Avoid holding urine for long periods of time.  Empty your bladder before and after sexual intercourse.  After a bowel movement, women should cleanse from front to back. Use each tissue only once. SEEK MEDICAL CARE IF:   You have back pain.  You develop a fever.  Your symptoms do not begin to resolve within 3 days. SEEK IMMEDIATE MEDICAL CARE IF:   You have severe back pain or lower abdominal pain.  You develop chills.  You have nausea or vomiting.  You have continued burning or discomfort with urination. MAKE SURE YOU:     Understand these instructions.  Will watch your condition.  Will get help right away if you are not doing well or get worse.   This information is not intended to replace advice given to you by your health care provider. Make sure you discuss any questions you have with your health care provider.   Document Released: 10/25/2004 Document Revised: 10/06/2014 Document Reviewed: 02/23/2011 Elsevier Interactive Patient Education [employer].

## 2015-06-10 NOTE — Patient Instructions (Addendum)
Please to go ER now due to the low oxygen when the reason is not apparent  (For EDP consideration: done prior to finding of exertional hypoxia)  Please take all new medication as prescribed- the antibiotic for 10 days (septra), then cephalexin for prevention after that  Please continue all other medications as before, and refills have been done if requested.  Please have the pharmacy call with any other refills you may need.  Please keep your appointments with your specialists as you may have planned  You will be contacted regarding the referral for: cardiology, and neurology)

## 2015-06-10 NOTE — Addendum Note (Signed)
Addended by: Johney Frame, Adelina Mings M on: 06/10/2015 05:00 PM   Modules accepted: Orders

## 2015-06-10 NOTE — Assessment & Plan Note (Signed)
Ok to refer to Neurology,  to f/u any worsening symptoms or concerns

## 2015-06-10 NOTE — Assessment & Plan Note (Signed)
Rate and volume stable, to cont same tx

## 2015-06-11 LAB — TOXASSURE SELECT,+ANTIDEPR,UR

## 2015-06-13 ENCOUNTER — Telehealth: Payer: Self-pay

## 2015-06-13 LAB — URINE CULTURE: Colony Count: >=100000

## 2015-06-13 NOTE — Telephone Encounter (Signed)
Verbal given for [facility] to go to pt home and access due to sx of diarrhea.

## 2015-06-13 NOTE — Telephone Encounter (Signed)
Ok for verbal 

## 2015-06-13 NOTE — Telephone Encounter (Signed)
Rose Sullivan from [place] would like [place] to call her with a verbal order for a speech therapy evaluation. OK to leave a message on her cell [phone].

## 2015-06-14 ENCOUNTER — Ambulatory Visit (INDEPENDENT_AMBULATORY_CARE_PROVIDER_SITE_OTHER): Payer: Medicare Other | Admitting: Family

## 2015-06-14 ENCOUNTER — Telehealth: Payer: Self-pay | Admitting: Family

## 2015-06-14 ENCOUNTER — Other Ambulatory Visit (INDEPENDENT_AMBULATORY_CARE_PROVIDER_SITE_OTHER): Payer: Medicare Other

## 2015-06-14 ENCOUNTER — Encounter: Payer: Self-pay | Admitting: Family

## 2015-06-14 VITALS — BP 142/80 | HR 72 | Temp 97.6°F | Resp 16 | Ht 60.0 in | Wt 165.0 lb

## 2015-06-14 DIAGNOSIS — R0600 Dyspnea, unspecified: Secondary | ICD-10-CM

## 2015-06-14 DIAGNOSIS — I639 Cerebral infarction, unspecified: Secondary | ICD-10-CM | POA: Diagnosis not present

## 2015-06-14 DIAGNOSIS — I63212 Cerebral infarction due to unspecified occlusion or stenosis of left vertebral arteries: Secondary | ICD-10-CM | POA: Diagnosis not present

## 2015-06-14 DIAGNOSIS — I6322 Cerebral infarction due to unspecified occlusion or stenosis of basilar arteries: Secondary | ICD-10-CM

## 2015-06-14 DIAGNOSIS — R410 Disorientation, unspecified: Secondary | ICD-10-CM | POA: Diagnosis not present

## 2015-06-14 DIAGNOSIS — N390 Urinary tract infection, site not specified: Secondary | ICD-10-CM

## 2015-06-14 DIAGNOSIS — I480 Paroxysmal atrial fibrillation: Secondary | ICD-10-CM

## 2015-06-14 DIAGNOSIS — I6381 Other cerebral infarction due to occlusion or stenosis of small artery: Secondary | ICD-10-CM

## 2015-06-14 LAB — COMPREHENSIVE METABOLIC PANEL
ALBUMIN: 4.2 g/dL (ref 3.5–5.2)
ALK PHOS: 57 U/L (ref 39–117)
ALT: 27 U/L (ref 0–35)
AST: 36 U/L (ref 0–37)
BUN: 21 mg/dL (ref 6–23)
CO2: 21 mEq/L (ref 19–32)
CREATININE: 1.72 mg/dL — AB (ref 0.40–1.20)
Calcium: 9.3 mg/dL (ref 8.4–10.5)
Chloride: 108 mEq/L (ref 96–112)
GFR: 30.32 mL/min — ABNORMAL LOW (ref >60.00)
Glucose, Bld: 101 mg/dL — ABNORMAL HIGH (ref 70–99)
POTASSIUM: 3.7 meq/L (ref 3.5–5.1)
SODIUM: 141 meq/L (ref 135–145)
TOTAL PROTEIN: 7.2 g/dL (ref 6.0–8.3)
Total Bilirubin: 0.9 mg/dL (ref 0.2–1.2)

## 2015-06-14 LAB — BASIC METABOLIC PANEL
BUN: 21 mg/dL (ref 6–23)
CALCIUM: 9.3 mg/dL (ref 8.4–10.5)
CHLORIDE: 108 meq/L (ref 96–112)
CO2: 21 meq/L (ref 19–32)
CREATININE: 1.72 mg/dL — AB (ref 0.40–1.20)
GFR: 30.32 mL/min — AB (ref >60.00)
GLUCOSE: 101 mg/dL — AB (ref 70–99)
Potassium: 3.7 mEq/L (ref 3.5–5.1)
Sodium: 141 mEq/L (ref 135–145)

## 2015-06-14 LAB — POCT URINALYSIS DIPSTICK
BILIRUBIN UA: NEGATIVE
GLUCOSE UA: NEGATIVE
Nitrite, UA: NEGATIVE
Spec Grav, UA: >=1.03
Urobilinogen, UA: NEGATIVE
pH, UA: 6

## 2015-06-14 LAB — CBC WITH DIFFERENTIAL/PLATELET
BASOS ABS: 0.1 10*3/uL (ref 0.0–0.1)
BASOS PCT: 0.6 % (ref 0.0–3.0)
EOS ABS: 0.2 10*3/uL (ref 0.0–0.7)
Eosinophils Relative: 1.7 % (ref 0.0–5.0)
HCT: 40.4 % (ref 36.0–46.0)
HEMOGLOBIN: 13.7 g/dL (ref 12.0–15.0)
Lymphocytes Relative: 15.4 % (ref 12.0–46.0)
Lymphs Abs: 1.5 10*3/uL (ref 0.7–4.0)
MCHC: 33.8 g/dL (ref 30.0–36.0)
MCV: 92.1 fl (ref 78.0–100.0)
MONO ABS: 0.8 10*3/uL (ref 0.1–1.0)
Monocytes Relative: 8.1 % (ref 3.0–12.0)
Neutro Abs: 7.4 10*3/uL (ref 1.4–7.7)
Neutrophils Relative %: 74.2 % (ref 43.0–77.0)
Platelets: 280 10*3/uL (ref 150.0–400.0)
RBC: 4.39 Mil/uL (ref 3.87–5.11)
RDW: 14.4 % (ref 11.5–15.5)
WBC: 10 10*3/uL (ref 4.0–10.5)

## 2015-06-14 LAB — CBC
HEMATOCRIT: 40.4 % (ref 36.0–46.0)
Hemoglobin: 13.7 g/dL (ref 12.0–15.0)
MCHC: 33.8 g/dL (ref 30.0–36.0)
MCV: 92.1 fl (ref 78.0–100.0)
Platelets: 280 10*3/uL (ref 150.0–400.0)
RBC: 4.39 Mil/uL (ref 3.87–5.11)
RDW: 14.4 % (ref 11.5–15.5)
WBC: 10 10*3/uL (ref 4.0–10.5)

## 2015-06-14 LAB — AMMONIA: AMMONIA: 21 umol/L (ref 11–35)

## 2015-06-14 NOTE — Patient Instructions (Addendum)
Thank you for choosing [facility].  Summary/Instructions:  Please continue to take the antibiotic until completion.   We will check on labs to ensure no other infection or cause for the symptoms.  If your symptoms worsen or fail to improve, please contact our office for further instruction, or in case of emergency go directly to the emergency room at the closest medical facility.   They will call to schedule the appointment with neurology.

## 2015-06-14 NOTE — Progress Notes (Signed)
Pre visit review using our clinic review tool, if applicable. No additional management support is needed unless otherwise documented below in the visit note. 

## 2015-06-14 NOTE — Telephone Encounter (Signed)
Reviewed and noted.

## 2015-06-14 NOTE — Telephone Encounter (Signed)
FYI - verbal given yesterday when Rose Sullivan called in to rq. (previous note). Pt and dtr will be here at 1 pm.   Contacted Angela (pt dtr) to inquire how pt was feeling today. [name] stated that [facility] came out yesterday to evaluate patient due to the diarrhea. All vitals were normal. HH advised to wait it out at this point.   [name] stated that pt was still shaking/jerking yesterday and this morning. Pt is red in the face but does not have a temperature. Pt was found in the floor this AM and needed assistance to get pt back up into bed. [name] also stated that pt is not making a lot of sense when she is talking: stating "she is talking out of her head". Additionally, pt had 3 bouts of diarrhea on Sunday. Appetite is some what okay. Advised to increase fluids and offer food often to help with dehydration from the diarrhea. The shaking is prohibiting most of pt ADL's.   Based on the converstation I advise for dtr and pt to come in due to the redness in face (could be more of a skin reaction than color change due to temperature) and the shaking. I did reassure [name] that the shaking may just be from weakness but we would need to evaluate that in office just to make sure.    Confirmed with dtr that pt is only taking the Bactrim right now and not both the Cephalexin and Bactrim.

## 2015-06-14 NOTE — Progress Notes (Signed)
Urine drug screen for this encounter is consistent for prescribed medications.   

## 2015-06-14 NOTE — Assessment & Plan Note (Signed)
Disorientation with question of delirium secondary to infection or progressing dementia, but appears with slightly rapid onset in the presence of infection. There is also concern for possible narcotic withdrawal as patient has not taken her oxycodone in the last 3 days which would possibly explain the shaking described by family and also contribute to the disorientation. Encouraged to continue to hold pain mediation unless needed pending completion of antibiotics to determine improvement in mental status. Obtain CBC, ammonia and CMET to rule out additional metabolic causes. Follow up pending blood work.

## 2015-06-14 NOTE — Progress Notes (Signed)
Subjective:    Patient ID: Rose Sullivan, female    DOB: 1933/04/15, 82 y.o.   MRN: [id number]  Chief Complaint  Patient presents with  . Diarrhea    had diarrhea on sunday but not since then, very jittery and jerky, gets really hot at night, no fever, x2 days     HPI:  Rose Sullivan is a 82 y.o. 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/2006); Other specified forms of hearing loss (09/05/2009); HYPERTENSION (02/11/2007); Atrial fibrillation ([place]) (03/07/2009); GERD (02/11/2007); LOW BACK PAIN, CHRONIC (11/25/2006); BACK PAIN (12/10/2007); Pain in Soft Tissues of Limb (02/11/2007); OSTEOPENIA (02/11/2007); FATIGUE (12/10/2007); GAIT DISTURBANCE (12/10/2007); DYSPNEA (03/18/2009); Cough (03/07/2009); CHEST PAIN (12/10/2007); Abdominal pain, left lower quadrant (04/01/2007); BREAST CANCER, HX OF (11/25/2006); Nocturia (05/18/2010); Hypersomnia (05/18/2010); Impaired glucose tolerance (05/18/2010); Allergic rhinitis, cause unspecified (07/23/2012); Anxiety state, unspecified (02/04/2013); and Cancer ([place]). and presents today for an acute and follow up office visit.   Previously evaluated in the office for new onset shortness of breath and worsening confusion since she was last treated for a UTI in April of 2017. Subjectively there are no fevers reported at the time. She denied any chest pain or wheezing and oxygen saturation of 87% on room air all walking which improved to 98% while resting. She was found to be confused alert. Urinalysis was positive for leukocytes and nitrites and hemoglobin. Her specific gravity was also noted to be above 1.030. She was treated for urinary tract infection with Bactrim. Follow-up urine culture results showed Escherichia coli with susceptibility to Bactrim. She was also referred to the emergency department secondary to the shortness of breath. Her physical exam was consistent with  dehydration given dry mucous membranes. She was also disoriented to time with right-sided weakness and mild confusion. In the ED she was able to ambulate with no hypoxia and chest x-ray showed small pleural effusion with no evidence of congestive heart failure or pneumonia. She was given a small bolus of IV fluids. She was noted to have diarrhea for which home health evaluated advised to wait. They rechecked on her on 06/13/15 and noted all vitals were normal, however patient was found on the floor and needed assistance getting back into bed. Her orientation level was questionable as home health RN indicated "she is talking out of her head". She had 3 bouts of diarrhea on Sunday and had an adequate appetite. She was also noted to have redness in her face and shaking.  Describes the associated symptoms of shakiness and jitteriness as well as confusion. Denies any new fevers. Since leaving the ED family reports that she has had marked confusion that has waxed and waned. Describes that her skin feels hot at times but there is no measureable fever associated with it. She continues to take the Bactrim without any problems. The diarrhea appears to have stopped. Reports that she is eating and drinking well. Denies any pain. She sleeps enough on average.   Allergies  Allergen Reactions  . Trazodone And Nefazodone Other (See Comments)    hallucinations    Current Outpatient Prescriptions on File Prior to Visit  Medication Sig Dispense Refill  . cephALEXin (KEFLEX) 250 MG capsule 1 tab by mouth on Mon- Wed- Fri 36 capsule 3  . diclofenac sodium (VOLTAREN) 1 % GEL Reported on 05/02/2015  1  . Diclofenac Sodium 1.5 % SOLN Place 2 mLs onto the skin 3 (three) times daily.  3 Bottle 3  . diltiazem (CARDIZEM CD) 240 MG 24 hr capsule Take 1 capsule (240 mg total) by mouth daily. 30 capsule 6  . ELIQUIS 5 MG TABS tablet TAKE 1 TABLET BY MOUTH TWICE A DAY 60 tablet 0  . exemestane (AROMASIN) 25 MG tablet TAKE 1 TABLET BY  MOUTH EVERY DAY 30 tablet 1  . megestrol (MEGACE) 40 MG/ML suspension TAKE 10 ML BY MOUTH EVERY DAY 240 mL 0  . metoprolol tartrate (LOPRESSOR) 25 MG tablet Take 0.5 tablets (12.5 mg total) by mouth 2 (two) times daily. 60 tablet 1  . oxyCODONE-acetaminophen (PERCOCET) 7.5-325 MG tablet Take 1 tablet by mouth every 8 (eight) hours as needed. 120 tablet 0  . pravastatin (PRAVACHOL) 40 MG tablet TAKE 1 TABLET BY MOUTH EVERY DAY AT 6 PM 30 tablet 0  . sulfamethoxazole-trimethoprim (BACTRIM DS,SEPTRA DS) 800-160 MG tablet Take 1 tablet by mouth 2 (two) times daily. 20 tablet 0   No current facility-administered medications on file prior to visit.    Review of Systems  Unable to perform ROS: Mental status change      Objective:    BP 142/80 mmHg  Pulse 72  Temp(Src) 97.6 F (36.4 C) (Oral)  Resp 16  Ht 5' (1.524 m)  Wt 165 lb (74.844 kg)  BMI 32.22 kg/m2  SpO2 97% Nursing note and vital signs reviewed.  Physical Exam  Constitutional: She appears well-developed and well-nourished. No distress.  HENT:  Right Ear: Hearing, tympanic membrane, external ear and ear canal normal.  Left Ear: Hearing, tympanic membrane, external ear and ear canal normal.  Nose: Nose normal.  Mouth/Throat: Uvula is midline, oropharynx is clear and moist and mucous membranes are normal.  Cardiovascular: Normal rate, regular rhythm, normal heart sounds and intact distal pulses.   Pulmonary/Chest: Effort normal and breath sounds normal. No respiratory distress. She has no wheezes. She has no rales. She exhibits no tenderness.  Neurological: She is alert. She is disoriented. She displays no tremor. No sensory deficit. She displays no seizure activity.  Decreased strength on upper and lower right extremities. Requires assistance with standing which is a change from baseline.   Skin: Skin is warm and dry.  Psychiatric: She has a normal mood and affect. Her behavior is normal. Judgment and thought content normal.  Cognition and memory are impaired.       Assessment & Plan:   Problem List Items Addressed This Visit      Genitourinary   Acute UTI    In office urinalysis positive to leukocytes and negative for nitrites and hematuria which appears improved from previous. No indication for culture secondary to continued antibiotic therapy with additional 6 days remaining in treatment course. No fevers. UTI appears to be contributing cause to disorientation. Continue current dosage of Bactrim until completed. Encouraged to drink plenty of fluids as specific gravity remains high.       Relevant Orders   CBC (Completed)   Comprehensive metabolic panel (Completed)   POCT urinalysis dipstick (Completed)     Other   Disorientation - Primary    Disorientation with question of delirium secondary to infection or progressing dementia, but appears with slightly rapid onset in the presence of infection. There is also concern for possible narcotic withdrawal as patient has not taken her oxycodone in the last 3 days which would possibly explain the shaking described by family and also contribute to the disorientation. Encouraged to continue to hold pain mediation unless needed pending completion of  antibiotics to determine improvement in mental status. Obtain CBC, ammonia and CMET to rule out additional metabolic causes. Follow up pending blood work.       Relevant Orders   CBC (Completed)   Comprehensive metabolic panel (Completed)   Ammonia (Completed)       I am having Ms. Vaca maintain her diltiazem, metoprolol tartrate, Diclofenac Sodium, megestrol, diclofenac sodium, exemestane, oxyCODONE-acetaminophen, ELIQUIS, pravastatin, sulfamethoxazole-trimethoprim, and cephALEXin.   Total of of 70minutes was spent in face-to-face contact with patient/family and greater than 20 minutes was spent discussing possible causes of symptoms, treatments and answering patient/family questions.   Follow-up: Return if  symptoms worsen or fail to improve.  Mauricio Po, FNP

## 2015-06-14 NOTE — Assessment & Plan Note (Signed)
In office urinalysis positive to leukocytes and negative for nitrites and hematuria which appears improved from previous. No indication for culture secondary to continued antibiotic therapy with additional 6 days remaining in treatment course. No fevers. UTI appears to be contributing cause to disorientation. Continue current dosage of Bactrim until completed. Encouraged to drink plenty of fluids as specific gravity remains high.

## 2015-06-14 NOTE — Telephone Encounter (Signed)
Please inform patient that her white/red blood cells and ammonia are within the normal limits. Her kidney function is decreased a little more than previous which may or may not be related to dehydration. Please have her drink plenty of non-caffenated fluids and follow up if symptoms do not improve.

## 2015-06-15 ENCOUNTER — Telehealth: Payer: Self-pay | Admitting: Internal Medicine

## 2015-06-15 NOTE — Telephone Encounter (Signed)
Is requesting lab results for patient.

## 2015-06-15 NOTE — Telephone Encounter (Signed)
Patient along with home health nurse is aware of lab results

## 2015-06-15 NOTE — Telephone Encounter (Signed)
Patient along with home health care nurse are aware of lab results

## 2015-06-16 ENCOUNTER — Ambulatory Visit: Payer: Medicare Other | Admitting: Family

## 2015-06-16 ENCOUNTER — Telehealth: Payer: Self-pay

## 2015-06-16 NOTE — Telephone Encounter (Signed)
Home Health Care nurse called Karel Jarvis [phone]), wanted Dr. Jenny Reichmann to know that patient is not improving. Nothing acute at the moment but she continues to be weak and confused.

## 2015-06-16 NOTE — Telephone Encounter (Signed)
Unable to reach tracy, home health nurse, left message to give [place] a call back.

## 2015-06-16 NOTE — Telephone Encounter (Signed)
Ok to watch for now, but any worsening mental status, fever, weakness, falls, or pain should go to ER

## 2015-06-16 NOTE — Telephone Encounter (Signed)
Please advise, is there anything I need to let the home health nurse know

## 2015-06-17 ENCOUNTER — Telehealth: Payer: Self-pay | Admitting: Internal Medicine

## 2015-06-17 DIAGNOSIS — F05 Delirium due to known physiological condition: Secondary | ICD-10-CM

## 2015-06-17 NOTE — Telephone Encounter (Signed)
Wanted to let Dr. Jenny Reichmann that there is no change.  Patient is still weak and confused.  No acute distressed.  Will see patient on Monday.  Patient should be done with antibiotics by then.  Family is requesting order to be sent for urine specimen to make sure patient is over UTI.

## 2015-06-17 NOTE — Telephone Encounter (Signed)
Spoke to Mehan, home health nurse, she will be getting a specimen on Monday and bringing the specimen in for culture.

## 2015-06-18 ENCOUNTER — Emergency Department (HOSPITAL_COMMUNITY): Payer: Medicare Other

## 2015-06-18 ENCOUNTER — Observation Stay (HOSPITAL_COMMUNITY): Payer: Medicare Other

## 2015-06-18 ENCOUNTER — Inpatient Hospital Stay (HOSPITAL_COMMUNITY)
Admission: EM | Admit: 2015-06-18 | Discharge: 2015-06-23 | DRG: 064 | Disposition: A | Discharge status: Home Health | Payer: Medicare Other | Attending: Family Medicine | Admitting: Family Medicine

## 2015-06-18 ENCOUNTER — Encounter (HOSPITAL_COMMUNITY): Payer: Self-pay | Admitting: Internal Medicine

## 2015-06-18 DIAGNOSIS — N289 Disorder of kidney and ureter, unspecified: Secondary | ICD-10-CM

## 2015-06-18 DIAGNOSIS — I5033 Acute on chronic diastolic (congestive) heart failure: Secondary | ICD-10-CM | POA: Diagnosis present

## 2015-06-18 DIAGNOSIS — Z823 Family history of stroke: Secondary | ICD-10-CM

## 2015-06-18 DIAGNOSIS — Z7901 Long term (current) use of anticoagulants: Secondary | ICD-10-CM

## 2015-06-18 DIAGNOSIS — E860 Dehydration: Secondary | ICD-10-CM | POA: Diagnosis present

## 2015-06-18 DIAGNOSIS — Z9011 Acquired absence of right breast and nipple: Secondary | ICD-10-CM

## 2015-06-18 DIAGNOSIS — G20 Parkinson's disease: Secondary | ICD-10-CM | POA: Diagnosis present

## 2015-06-18 DIAGNOSIS — R059 Cough, unspecified: Secondary | ICD-10-CM

## 2015-06-18 DIAGNOSIS — M545 Low back pain: Secondary | ICD-10-CM | POA: Diagnosis present

## 2015-06-18 DIAGNOSIS — R1313 Dysphagia, pharyngeal phase: Secondary | ICD-10-CM | POA: Diagnosis present

## 2015-06-18 DIAGNOSIS — R7302 Impaired glucose tolerance (oral): Secondary | ICD-10-CM | POA: Diagnosis present

## 2015-06-18 DIAGNOSIS — I639 Cerebral infarction, unspecified: Principal | ICD-10-CM | POA: Diagnosis present

## 2015-06-18 DIAGNOSIS — H548 Legal blindness, as defined in USA: Secondary | ICD-10-CM | POA: Diagnosis present

## 2015-06-18 DIAGNOSIS — G9340 Encephalopathy, unspecified: Secondary | ICD-10-CM | POA: Diagnosis present

## 2015-06-18 DIAGNOSIS — Z66 Do not resuscitate: Secondary | ICD-10-CM | POA: Diagnosis present

## 2015-06-18 DIAGNOSIS — R531 Weakness: Secondary | ICD-10-CM | POA: Diagnosis not present

## 2015-06-18 DIAGNOSIS — Z853 Personal history of malignant neoplasm of breast: Secondary | ICD-10-CM

## 2015-06-18 DIAGNOSIS — E785 Hyperlipidemia, unspecified: Secondary | ICD-10-CM | POA: Diagnosis present

## 2015-06-18 DIAGNOSIS — I4581 Long QT syndrome: Secondary | ICD-10-CM | POA: Diagnosis present

## 2015-06-18 DIAGNOSIS — G8929 Other chronic pain: Secondary | ICD-10-CM | POA: Diagnosis present

## 2015-06-18 DIAGNOSIS — R05 Cough: Secondary | ICD-10-CM

## 2015-06-18 DIAGNOSIS — Z87891 Personal history of nicotine dependence: Secondary | ICD-10-CM

## 2015-06-18 DIAGNOSIS — I69359 Hemiplegia and hemiparesis following cerebral infarction affecting unspecified side: Secondary | ICD-10-CM

## 2015-06-18 DIAGNOSIS — M8580 Other specified disorders of bone density and structure, unspecified site: Secondary | ICD-10-CM | POA: Diagnosis present

## 2015-06-18 DIAGNOSIS — R1311 Dysphagia, oral phase: Secondary | ICD-10-CM | POA: Diagnosis present

## 2015-06-18 DIAGNOSIS — I959 Hypotension, unspecified: Secondary | ICD-10-CM | POA: Diagnosis present

## 2015-06-18 DIAGNOSIS — R410 Disorientation, unspecified: Secondary | ICD-10-CM

## 2015-06-18 DIAGNOSIS — G8191 Hemiplegia, unspecified affecting right dominant side: Secondary | ICD-10-CM | POA: Diagnosis present

## 2015-06-18 DIAGNOSIS — I480 Paroxysmal atrial fibrillation: Secondary | ICD-10-CM

## 2015-06-18 DIAGNOSIS — R4182 Altered mental status, unspecified: Secondary | ICD-10-CM | POA: Diagnosis present

## 2015-06-18 DIAGNOSIS — K219 Gastro-esophageal reflux disease without esophagitis: Secondary | ICD-10-CM | POA: Diagnosis present

## 2015-06-18 DIAGNOSIS — E7439 Other disorders of intestinal carbohydrate absorption: Secondary | ICD-10-CM | POA: Diagnosis present

## 2015-06-18 DIAGNOSIS — N3000 Acute cystitis without hematuria: Secondary | ICD-10-CM | POA: Diagnosis present

## 2015-06-18 DIAGNOSIS — I63532 Cerebral infarction due to unspecified occlusion or stenosis of left posterior cerebral artery: Secondary | ICD-10-CM | POA: Insufficient documentation

## 2015-06-18 DIAGNOSIS — I130 Hypertensive heart and chronic kidney disease with heart failure and stage 1 through stage 4 chronic kidney disease, or unspecified chronic kidney disease: Secondary | ICD-10-CM | POA: Diagnosis present

## 2015-06-18 DIAGNOSIS — N183 Chronic kidney disease, stage 3 (moderate): Secondary | ICD-10-CM | POA: Diagnosis present

## 2015-06-18 LAB — DIFFERENTIAL
Basophils Absolute: 0 10*3/uL (ref 0.0–0.1)
Basophils Relative: 0 %
Eosinophils Absolute: 0.3 10*3/uL (ref 0.0–0.7)
Eosinophils Relative: 5 %
LYMPHS ABS: 1.3 10*3/uL (ref 0.7–4.0)
LYMPHS PCT: 19 %
MONO ABS: 0.8 10*3/uL (ref 0.1–1.0)
Monocytes Relative: 12 %
Neutro Abs: 4.3 10*3/uL (ref 1.7–7.7)
Neutrophils Relative %: 64 %

## 2015-06-18 LAB — COMPREHENSIVE METABOLIC PANEL
ALBUMIN: 3.3 g/dL — AB (ref 3.5–5.0)
ALK PHOS: 51 U/L (ref 38–126)
ALT: 45 U/L (ref 14–54)
ANION GAP: 10 (ref 5–15)
AST: 56 U/L — ABNORMAL HIGH (ref 15–41)
BILIRUBIN TOTAL: 0.7 mg/dL (ref 0.3–1.2)
BUN: 17 mg/dL (ref 6–20)
CALCIUM: 8.6 mg/dL — AB (ref 8.9–10.3)
CO2: 19 mmol/L — ABNORMAL LOW (ref 22–32)
CREATININE: 1.51 mg/dL — AB (ref 0.44–1.00)
Chloride: 109 mmol/L (ref 101–111)
GFR calc non Af Amer: 31 mL/min — ABNORMAL LOW (ref >60)
GFR, EST AFRICAN AMERICAN: 36 mL/min — AB (ref >60)
GLUCOSE: 110 mg/dL — AB (ref 65–99)
Potassium: 3.7 mmol/L (ref 3.5–5.1)
Sodium: 138 mmol/L (ref 135–145)
TOTAL PROTEIN: 6.3 g/dL — AB (ref 6.5–8.1)

## 2015-06-18 LAB — CBC
HCT: 38 % (ref 36.0–46.0)
HEMOGLOBIN: 12.6 g/dL (ref 12.0–15.0)
MCH: 30.2 pg (ref 26.0–34.0)
MCHC: 33.2 g/dL (ref 30.0–36.0)
MCV: 91.1 fL (ref 78.0–100.0)
Platelets: 233 10*3/uL (ref 150–400)
RBC: 4.17 MIL/uL (ref 3.87–5.11)
RDW: 13.6 % (ref 11.5–15.5)
WBC: 6.8 10*3/uL (ref 4.0–10.5)

## 2015-06-18 LAB — I-STAT CHEM 8, ED
BUN: 19 mg/dL (ref 6–20)
CALCIUM ION: 1.06 mmol/L — AB (ref 1.13–1.30)
Chloride: 108 mmol/L (ref 101–111)
Creatinine, Ser: 1.4 mg/dL — ABNORMAL HIGH (ref 0.44–1.00)
GLUCOSE: 103 mg/dL — AB (ref 65–99)
HCT: 41 % (ref 36.0–46.0)
Hemoglobin: 13.9 g/dL (ref 12.0–15.0)
POTASSIUM: 3.6 mmol/L (ref 3.5–5.1)
SODIUM: 140 mmol/L (ref 135–145)
TCO2: 15 mmol/L (ref 0–100)

## 2015-06-18 LAB — VITAMIN B12: Vitamin B-12: 195 pg/mL (ref 180–914)

## 2015-06-18 LAB — SEDIMENTATION RATE: SED RATE: 7 mm/h (ref 0–22)

## 2015-06-18 LAB — I-STAT TROPONIN, ED: Troponin i, poc: 0.01 ng/mL (ref 0.00–0.08)

## 2015-06-18 LAB — TSH: TSH: 1.639 u[IU]/mL (ref 0.350–4.500)

## 2015-06-18 LAB — PROTIME-INR
INR: 1.91 — ABNORMAL HIGH (ref 0.00–1.49)
Prothrombin Time: 21.8 seconds — ABNORMAL HIGH (ref 11.6–15.2)

## 2015-06-18 LAB — APTT: aPTT: 28 seconds (ref 24–37)

## 2015-06-18 LAB — HEPARIN LEVEL (UNFRACTIONATED): Heparin Unfractionated: >2.2 IU/mL — ABNORMAL HIGH (ref 0.30–0.70)

## 2015-06-18 LAB — ETHANOL: Alcohol, Ethyl (B): <5 mg/dL (ref <5)

## 2015-06-18 MED ORDER — SODIUM CHLORIDE 0.9% FLUSH: 3.0000 mL | INTRAVENOUS | Status: DC | PRN

## 2015-06-18 MED ORDER — SODIUM CHLORIDE 0.9 % IV SOLN: 250.0000 mL | INTRAVENOUS | Status: DC | PRN

## 2015-06-18 MED ORDER — EXEMESTANE 25 MG PO TABS
25.0000 mg | ORAL_TABLET | Freq: Every day | ORAL | Status: DC
  Administered 2015-06-20 – 2015-06-23 (×4): 25 mg via ORAL
  Filled 2015-06-18 (×5): qty 1

## 2015-06-18 MED ORDER — CEFTRIAXONE SODIUM 1 G IJ SOLR
1.0000 g | Freq: Once | INTRAMUSCULAR | Status: AC
  Administered 2015-06-19: 1 g via INTRAVENOUS
  Filled 2015-06-18: qty 10

## 2015-06-18 MED ORDER — METOPROLOL TARTRATE 5 MG/5ML IV SOLN
2.5000 mg | Freq: Two times a day (BID) | INTRAVENOUS | Status: DC
  Administered 2015-06-19: 2.5 mg via INTRAVENOUS
  Filled 2015-06-18: qty 5

## 2015-06-18 MED ORDER — ACETAMINOPHEN 325 MG PO TABS: 650.0000 mg | ORAL_TABLET | Freq: Four times a day (QID) | ORAL | Status: DC | PRN

## 2015-06-18 MED ORDER — SODIUM CHLORIDE 0.9 % IV SOLN: INTRAVENOUS | Status: DC

## 2015-06-18 MED ORDER — SODIUM CHLORIDE 0.9 % IV SOLN: INTRAVENOUS | Status: AC

## 2015-06-18 MED ORDER — HEPARIN (PORCINE) IN NACL 100-0.45 UNIT/ML-% IJ SOLN
700.0000 [IU]/h | INTRAMUSCULAR | Status: DC
  Administered 2015-06-19: 800 [IU]/h via INTRAVENOUS
  Administered 2015-06-20: 700 [IU]/h via INTRAVENOUS
  Filled 2015-06-18 (×3): qty 250

## 2015-06-18 MED ORDER — SODIUM CHLORIDE 0.9% FLUSH
3.0000 mL | Freq: Two times a day (BID) | INTRAVENOUS | Status: DC
  Administered 2015-06-19: 3 mL via INTRAVENOUS

## 2015-06-18 MED ORDER — METOPROLOL TARTRATE 12.5 MG HALF TABLET: 12.5000 mg | ORAL_TABLET | Freq: Two times a day (BID) | ORAL | Status: DC

## 2015-06-18 MED ORDER — DILTIAZEM HCL ER COATED BEADS 240 MG PO CP24
240.0000 mg | ORAL_CAPSULE | Freq: Every day | ORAL | Status: DC
  Administered 2015-06-20 – 2015-06-21 (×2): 240 mg via ORAL
  Filled 2015-06-18 (×3): qty 1

## 2015-06-18 MED ORDER — SODIUM CHLORIDE 0.9% FLUSH
3.0000 mL | Freq: Two times a day (BID) | INTRAVENOUS | Status: DC
  Administered 2015-06-19 – 2015-06-20 (×3): 3 mL via INTRAVENOUS
  Administered 2015-06-20: 02:00:00 via INTRAVENOUS
  Administered 2015-06-21 – 2015-06-23 (×3): 3 mL via INTRAVENOUS

## 2015-06-18 MED ORDER — PRAVASTATIN SODIUM 40 MG PO TABS
40.0000 mg | ORAL_TABLET | Freq: Every day | ORAL | Status: DC
Start: 2015-06-19 — End: 2015-06-23
  Administered 2015-06-20 – 2015-06-22 (×2): 40 mg via ORAL
  Filled 2015-06-18 (×2): qty 1

## 2015-06-18 MED ORDER — APIXABAN 5 MG PO TABS: 5.0000 mg | ORAL_TABLET | Freq: Two times a day (BID) | ORAL | Status: DC

## 2015-06-18 MED ORDER — SULFAMETHOXAZOLE-TRIMETHOPRIM 800-160 MG PO TABS: 1.0000 | ORAL_TABLET | Freq: Two times a day (BID) | ORAL | Status: DC

## 2015-06-18 MED ORDER — ACETAMINOPHEN 650 MG RE SUPP: 650.0000 mg | Freq: Four times a day (QID) | RECTAL | Status: DC | PRN

## 2015-06-18 MED ORDER — WHITE PETROLATUM GEL
Status: AC
  Administered 2015-06-19: 0.2
  Filled 2015-06-18: qty 1

## 2015-06-18 NOTE — ED Notes (Signed)
Per [facility] patient woke up this morning with right sided weakness, last seen normal 10pm last night.  MD at bedside.

## 2015-06-18 NOTE — Progress Notes (Signed)
Patient admitted from PACU. Patient drowsy but easily aroused. Patient made comfortable. Patient's medication given to daughter. Tele placed.

## 2015-06-18 NOTE — H&P (Signed)
TRH H&P   Patient Demographics:    Rose Sullivan, is a 82 y.o. female  MRN: [id number]   DOB - [birth year]  Admit Date - 06/18/2015  Outpatient Primary MD for the patient is [name], MD  Referring MD/NP/PA:  [facility]:  Loralie Champagne (cardiologist)  Patient coming from: home, lives with family  Chief Complaint  Patient presents with  . Extremity Weakness      HPI:    Rose Sullivan  is a 82 y.o. female, w/ CVA with residual right sided weakness, Pafib, Glucose intolerance, apparently per family had altered mental status.  Pt is unable to talk like she normally does.  Pt seems more somnolent.  Denies headache, cp, palp sob, lower ext edema. . Pt has had recent UTI.   Pt has been taking Bactrim DS, which is new to her. Pt was brought to ED by her family, daughter.  CT brain negative for any acute process.      Review of systems:    In addition to the HPI above,  No Fever-chills, No Headache, No changes with Vision or hearing, No problems swallowing food or Liquids, No Chest pain, Cough or Shortness of Breath, No Abdominal pain, No Nausea or Vommitting, Bowel movements are regular, No Blood in stool or Urine, No dysuria, No new skin rashes or bruises, No new joints pains-aches,  No new weakness, tingling, numbness in any extremity, No recent weight gain or loss, No polyuria, polydypsia or polyphagia, No significant Mental Stressors.  A full 10 point Review of Systems was done, except as stated above, all other Review of Systems were negative.   With Past History of the following :    Past Medical History  Diagnosis Date  . GLUCOSE INTOLERANCE 02/11/2007  . HYPERLIPIDEMIA 02/11/2007  . BLINDNESS, [place], [place] DEFINITION 11/25/2006  . Other specified forms of hearing loss 09/05/2009  . HYPERTENSION 02/11/2007  . Atrial fibrillation ([place])  03/07/2009  . GERD 02/11/2007  . LOW BACK PAIN, CHRONIC 11/25/2006  . BACK PAIN 12/10/2007  . Pain in Soft Tissues of Limb 02/11/2007  . OSTEOPENIA 02/11/2007  . FATIGUE 12/10/2007  . GAIT DISTURBANCE 12/10/2007  . DYSPNEA 03/18/2009  . Cough 03/07/2009  . CHEST PAIN 12/10/2007  . Abdominal pain, left lower quadrant 04/01/2007  . BREAST CANCER, HX OF 11/25/2006  . Nocturia 05/18/2010  . Hypersomnia 05/18/2010  . Impaired glucose tolerance 05/18/2010  . Allergic rhinitis, cause unspecified 07/23/2012  . Anxiety state, unspecified 02/04/2013  . Cancer ([place])   . Renal insufficiency 06/18/2015      Past Surgical History  Procedure Laterality Date  . Dilation and curettage of uterus    . S/p mastectomy Right   . Appendectomy    . Cataract extraction    . Knee surgery Right       Social History:     Social History  Substance Use Topics  . Smoking status: Never Smoker   . Smokeless tobacco: Never Used     Comment: quit 40 years ago  . Alcohol Use: No     Lives - at home with daughter  Mobility - doesn't walk     Family History :     Family History  Problem Relation Age of Onset  . Hypertension Brother   . Lung cancer Brother   . Colon cancer Father   . COPD Brother   . Stroke Mother   . Colon cancer Father       Home Medications:   Prior to Admission medications   Medication Sig Start Date End Date Taking? Authorizing Provider  diltiazem (CARDIZEM CD) 240 MG 24 hr capsule Take 1 capsule (240 mg total) by mouth daily. 03/28/15  Yes Daniel J Angiulli, PA-C  ELIQUIS 5 MG TABS tablet TAKE 1 TABLET BY MOUTH TWICE A DAY 06/07/15  Yes Biagio Borg, MD  exemestane (AROMASIN) 25 MG tablet TAKE 1 TABLET BY MOUTH EVERY DAY 05/31/15  Yes Biagio Borg, MD  metoprolol tartrate (LOPRESSOR) 25 MG tablet Take 0.5 tablets (12.5 mg total) by mouth 2 (two) times daily. 03/28/15  Yes Daniel J Angiulli, PA-C  pravastatin (PRAVACHOL) 40 MG tablet TAKE 1 TABLET BY MOUTH EVERY DAY AT 6 PM 06/07/15  Yes  Biagio Borg, MD  sulfamethoxazole-trimethoprim (BACTRIM DS,SEPTRA DS) 800-160 MG tablet Take 1 tablet by mouth 2 (two) times daily. 06/10/15  Yes Biagio Borg, MD  cephALEXin [facility]) 250 MG capsule 1 tab by mouth on Mon- Wed- Fri 06/10/15   Biagio Borg, MD  diclofenac sodium (VOLTAREN) 1 % GEL Reported on 05/02/2015 04/04/15   Historical Provider, MD  Diclofenac Sodium 1.5 % SOLN Place 2 mLs onto the skin 3 (three) times daily. 04/05/15   Meredith Staggers, MD  megestrol (MEGACE) 40 MG/ML suspension TAKE 10 ML BY MOUTH EVERY DAY 05/02/15   Meredith Staggers, MD  oxyCODONE-acetaminophen (PERCOCET) 7.5-325 MG tablet Take 1 tablet by mouth every 8 (eight) hours as needed. 06/03/15   Bayard Hugger, NP     Allergies:     Allergies  Allergen Reactions  . Trazodone And Nefazodone Other (See Comments)    hallucinations     Physical Exam:   Vitals  Blood pressure 136/84, pulse 72, temperature 98.5 F (36.9 C), temperature source Oral, resp. rate 19, SpO2 98 %.   1. General elderly white female  lying in bed in NAD,    2. Normal affect and insight, Not Suicidal or Homicidal, Awake Alert, Oriented X 3. Poor dentition  3. No F.N deficits, ALL C.Nerves Intact, Strength  5-/5 right arm and right leg, and otherwise 5/5 in the rest of her ext, Sensation intact all 4 extremities, Plantars down going.  4. Ears and Eyes appear Normal, Conjunctivae clear, PERRLA. Moist Oral Mucosa.  5. Supple Neck, No JVD, No cervical lymphadenopathy appriciated, No Carotid Bruits.  6. Symmetrical Chest wall movement, Good air movement bilaterally, CTAB.  7. RRR, No Gallops, Rubs or Murmurs, No Parasternal Heave.  8. Positive Bowel Sounds, Abdomen Soft, No tenderness, No organomegaly appriciated,No rebound -guarding or rigidity.  9.  No Cyanosis, Normal Skin Turgor, No Skin Rash or Bruise.  10. Good muscle tone,  joints appear normal , no effusions, Normal ROM.  11. No Palpable Lymph Nodes in Neck or Axillae    Data Review:    CBC  Recent Labs Lab 06/14/15 1410  06/18/15 1342 06/18/15 1350  WBC 10.0  10.0 6.8  --   HGB 13.7  13.7 12.6 13.9  HCT 40.4  40.4 38.0 41.0  PLT 280.0  280.0 233  --   MCV 92.1  92.1 91.1  --   MCH  --  30.2  --   MCHC 33.8  33.8 33.2  --   RDW 14.4  14.4 13.6  --   LYMPHSABS 1.5 1.3  --   MONOABS 0.8 0.8  --   EOSABS 0.2 0.3  --   BASOSABS 0.1 0.0  --    ------------------------------------------------------------------------------------------------------------------  Chemistries   Recent Labs Lab 06/14/15 1410 06/18/15 1342 06/18/15 1350  NA 141  141 138 140  K 3.7  3.7 3.7 3.6  CL 108  108 109 108  CO2 21  21 19*  --   GLUCOSE 101*  101* 110* 103*  BUN _0 CREATININE 1.72*  1.72* 1.51* 1.40*  CALCIUM 9.3  9.3 8.6*  --   AST 36 56*  --   ALT 27 45  --   ALKPHOS 57 51  --   BILITOT 0.9 0.7  --    ------------------------------------------------------------------------------------------------------------------ estimated creatinine clearance is 28.9 mL/min (by C-G formula based on Cr of 1.4). ------------------------------------------------------------------------------------------------------------------ No results for input(s): TSH, T4TOTAL, T3FREE, THYROIDAB in the last 72 hours.  Invalid input(s): FREET3  Coagulation profile  Recent Labs Lab 06/18/15 1342  INR 1.91*   ------------------------------------------------------------------------------------------------------------------- No results for input(s): DDIMER in the last 72 hours. -------------------------------------------------------------------------------------------------------------------  Cardiac Enzymes No results for input(s): CKMB, TROPONINI, MYOGLOBIN in the last 168 hours.  Invalid input(s): CK ------------------------------------------------------------------------------------------------------------------ No results found for:  BNP   ---------------------------------------------------------------------------------------------------------------  Urinalysis    Component Value Date/Time   COLORURINE YELLOW 06/10/2015 [place] 06/10/2015 1631   LABSPEC >=1.030* 06/10/2015 1631   PHURINE 5.0 06/10/2015 [place] 06/10/2015 [place] 04/11/2015 2112   HGBUR LARGE* 06/10/2015 1631   BILIRUBINUR negative 06/14/2015 1403   BILIRUBINUR MODERATE* 06/10/2015 1631   KETONESUR TRACE* 06/10/2015 1631   PROTEINUR 2+ 06/14/2015 1403   PROTEINUR 100* 04/11/2015 2112   UROBILINOGEN negative 06/14/2015 1403   UROBILINOGEN 1.0 06/10/2015 1631   NITRITE negative 06/14/2015 1403   NITRITE POSITIVE* 06/10/2015 1631   LEUKOCYTESUR moderate (2+)* 06/14/2015 1403    ----------------------------------------------------------------------------------------------------------------   Imaging Results:    Ct Head Wo Contrast  06/18/2015  CLINICAL DATA:  82 year old female with a history of right-sided weakness EXAM: CT HEAD WITHOUT CONTRAST TECHNIQUE: Contiguous axial images were obtained from the base of the skull through the vertex without intravenous contrast. COMPARISON:  MR 04/12/2015, CT head 04/11/2015 FINDINGS: Unremarkable appearance of the calvarium without acute fracture or aggressive lesion. Unremarkable appearance of the scalp soft tissues. Unremarkable appearance of the bilateral orbits. Mastoid air cells are clear. No significant paranasal sinus disease No acute intracranial hemorrhage.  No midline shift or mass effect. Volume loss with expansion of the ventricles, not out of proportion to the degree of sulcal prominence. Encephalomalacia of the left occipital region compatible with remote infarction. Gray-white differentiation maintained. Intracranial atherosclerosis. IMPRESSION: No CT evidence of acute intracranial abnormality. Changes of chronic microvascular ischemic disease,  as well as encephalomalacia of prior left occipital infarction. Signed, Dulcy Fanny. Earleen Newport, DO [facility] Radiology Electronically Signed   By: Corrie Mckusick D.O.   On: 06/18/2015 15:10     Assessment & Plan:  Principal Problem:   Weakness Active Problems:   Impaired glucose tolerance   Hemiparesis affecting dominant side as late effect of stroke (HCC)   Paroxysmal atrial fibrillation (HCC)   Renal insufficiency    1. AMS Check ua Check b12, folate, esr, rpr, ana , tsh MRI brain without contrast r/o CVA  2.  Uti Cont bactrim  3. Renal insufficiency Hydrate gently with normal saline  4.  Hyperglycemia Check hga1c  5.  Pafib Cont eliquis.    DVT Prophylaxis on eliquis  AM Labs Ordered, also please review Full Orders  Family Communication: Admission, patients condition and plan of care including tests being ordered have been discussed with the patient  who indicate understanding and agree with the plan and Code Status.  Code Status Full code  Likely DC to  home  Condition GUARDED    Consults called: none   Admission status: observation  Time spent in minutes : 40   Jani Gravel M.D on 06/18/2015 at 4:36 PM  Between 7am to 7pm - Pager - [phone]. After 7pm go to [email or web address] - password [facility]  [facility] - Office  [phone]

## 2015-06-18 NOTE — ED Provider Notes (Signed)
CSN: [id number]     Arrival date & time 06/18/15  1317 History   First MD Initiated Contact with Patient 06/18/15 1319     Chief Complaint  Patient presents with  . Extremity Weakness     (Consider location/radiation/quality/duration/timing/severity/associated sxs/prior Treatment) HPI   Rose Sullivan is a 82 y.o. female who reportedly presents for right-sided weakness, greater than usual. She is unable to give history. She presents by EMS. They reported that the last time she was seen normal was yesterday.    Past Medical History  Diagnosis Date  . GLUCOSE INTOLERANCE 02/11/2007  . HYPERLIPIDEMIA 02/11/2007  . BLINDNESS, [place], [place] DEFINITION 11/25/2006  . Other specified forms of hearing loss 09/05/2009  . HYPERTENSION 02/11/2007  . Atrial fibrillation ([place]) 03/07/2009  . GERD 02/11/2007  . LOW BACK PAIN, CHRONIC 11/25/2006  . BACK PAIN 12/10/2007  . Pain in Soft Tissues of Limb 02/11/2007  . OSTEOPENIA 02/11/2007  . FATIGUE 12/10/2007  . GAIT DISTURBANCE 12/10/2007  . DYSPNEA 03/18/2009  . Cough 03/07/2009  . CHEST PAIN 12/10/2007  . Abdominal pain, left lower quadrant 04/01/2007  . BREAST CANCER, HX OF 11/25/2006  . Nocturia 05/18/2010  . Hypersomnia 05/18/2010  . Impaired glucose tolerance 05/18/2010  . Allergic rhinitis, cause unspecified 07/23/2012  . Anxiety state, unspecified 02/04/2013  . Cancer [facility])    Past Surgical History  Procedure Laterality Date  . Dilation and curettage of uterus    . S/p mastectomy    . Appendectomy     Family History  Problem Relation Age of Onset  . Hypertension Brother   . Lung cancer Brother   . Colon cancer Father   . COPD Brother   . Stroke Mother    Social History  Substance Use Topics  . Smoking status: Former Research scientist (life sciences)  . Smokeless tobacco: Never Used     Comment: quit 40 years ago  . Alcohol Use: No   OB History    No data available     Review of Systems  Unable to perform ROS: Mental status change      Allergies   Trazodone and nefazodone  Home Medications   Prior to Admission medications   Medication Sig Start Date End Date Taking? Authorizing Provider  diltiazem (CARDIZEM CD) 240 MG 24 hr capsule Take 1 capsule (240 mg total) by mouth daily. 03/28/15  Yes Daniel J Angiulli, PA-C  ELIQUIS 5 MG TABS tablet TAKE 1 TABLET BY MOUTH TWICE A DAY 06/07/15  Yes Biagio Borg, MD  exemestane (AROMASIN) 25 MG tablet TAKE 1 TABLET BY MOUTH EVERY DAY 05/31/15  Yes Biagio Borg, MD  metoprolol tartrate (LOPRESSOR) 25 MG tablet Take 0.5 tablets (12.5 mg total) by mouth 2 (two) times daily. 03/28/15  Yes Daniel J Angiulli, PA-C  pravastatin (PRAVACHOL) 40 MG tablet TAKE 1 TABLET BY MOUTH EVERY DAY AT 6 PM 06/07/15  Yes Biagio Borg, MD  sulfamethoxazole-trimethoprim (BACTRIM DS,SEPTRA DS) 800-160 MG tablet Take 1 tablet by mouth 2 (two) times daily. 06/10/15  Yes Biagio Borg, MD  cephALEXin [facility]) 250 MG capsule 1 tab by mouth on Mon- Wed- Fri 06/10/15   Biagio Borg, MD  diclofenac sodium (VOLTAREN) 1 % GEL Reported on 05/02/2015 04/04/15   Historical Provider, MD  Diclofenac Sodium 1.5 % SOLN Place 2 mLs onto the skin 3 (three) times daily. 04/05/15   Meredith Staggers, MD  megestrol (MEGACE) 40 MG/ML suspension TAKE 10 ML BY MOUTH EVERY DAY 05/02/15  Meredith Staggers, MD  oxyCODONE-acetaminophen (PERCOCET) 7.5-325 MG tablet Take 1 tablet by mouth every 8 (eight) hours as needed. 06/03/15   Bayard Hugger, NP   BP 136/84 mmHg  Pulse 72  Temp(Src) 98.5 F (36.9 C) (Oral)  Resp 19  SpO2 98% Physical Exam  Constitutional: She appears well-developed.  Elderly, frail  HENT:  Head: Normocephalic and atraumatic.  Right Ear: External ear normal.  Left Ear: External ear normal.  Oral mucous membranes are dry  Eyes: Conjunctivae and EOM are normal. Pupils are equal, round, and reactive to light.  Neck: Normal range of motion and phonation normal. Neck supple.  Cardiovascular: Normal rate, regular rhythm and normal heart sounds.    Pulmonary/Chest: Effort normal and breath sounds normal. She exhibits no bony tenderness.  Abdominal: Soft. There is no tenderness.  Musculoskeletal: Normal range of motion.  Neurological: She is alert. No cranial nerve deficit or sensory deficit. She exhibits normal muscle tone. Coordination normal.  Mild dysarthria and expressive aphasia. Somewhat diminished right arm and right leg strength, with right arm ballistic movements, while attempting to lift.  Skin: Skin is warm, dry and intact.  Psychiatric: She has a normal mood and affect. Her behavior is normal. Judgment and thought content normal.  Nursing note and vitals reviewed.   ED Course  Procedures (including critical care time)  Initial clinical impression- possible progression of previous left brain stroke, with apparent clinical dehydration. Will treat with IV fluids, obtain imaging, and labs; and reassess.  Medications - No data to display  Patient Vitals for the past 24 hrs:  BP Temp Temp src Pulse Resp SpO2  06/18/15 1330 136/84 mmHg - - 72 19 98 %  06/18/15 1329 135/78 mmHg 98.5 F (36.9 C) Oral 72 19 98 %   16:20- patient's daughter is now here and is able to give additional history, to explain the findings today. She states for the last 2 weeks. The patient has had progressive weakness, confusion and change in behavior. Last week she was seen by her PCP who checked a urine sample, found her to be hypoxic, and sent her to the emergency department. She was evaluated in the emergency department and started on Septra DS which she continues to take. Despite this treatment, the patient got worse several days ago. The worsening is described as difficulty understanding speech, difficulty speaking, less active, unable to walk as usual, and a gurgling sound in her throat. Daughter also describes increased weakness, right-sided, and difficulty controlling her right hand.  4:22 PM Reevaluation with update and discussion. After initial  assessment and treatment, an updated evaluation reveals clinical status is unchanged. Findings discussed with patient, and family members, all questions answered, they agree with admission. Kailyn Vanderslice L   4:23 PM-Consult complete with hospitalist. Patient case explained and discussed. He agrees to admit patient for further evaluation and treatment. Call ended at 16:30  Labs Review Labs Reviewed  PROTIME-INR - Abnormal; Notable for the following:    Prothrombin Time 21.8 (*)    INR 1.91 (*)    All other components within normal limits  COMPREHENSIVE METABOLIC PANEL - Abnormal; Notable for the following:    CO2 19 (*)    Glucose, Bld 110 (*)    Creatinine, Ser 1.51 (*)    Calcium 8.6 (*)    Total Protein 6.3 (*)    Albumin 3.3 (*)    AST 56 (*)    GFR calc non Af Amer 31 (*)    GFR  calc Af Amer 36 (*)    All other components within normal limits  I-STAT CHEM 8, ED - Abnormal; Notable for the following:    Creatinine, Ser 1.40 (*)    Glucose, Bld 103 (*)    Calcium, Ion 1.06 (*)    All other components within normal limits  ETHANOL  APTT  CBC  DIFFERENTIAL  URINE RAPID DRUG SCREEN, HOSP PERFORMED  URINALYSIS, ROUTINE W REFLEX MICROSCOPIC (NOT AT [facility])  I-STAT TROPOININ, ED    Imaging Review Ct Head Wo Contrast  06/18/2015  CLINICAL DATA:  82 year old female with a history of right-sided weakness EXAM: CT HEAD WITHOUT CONTRAST TECHNIQUE: Contiguous axial images were obtained from the base of the skull through the vertex without intravenous contrast. COMPARISON:  MR 04/12/2015, CT head 04/11/2015 FINDINGS: Unremarkable appearance of the calvarium without acute fracture or aggressive lesion. Unremarkable appearance of the scalp soft tissues. Unremarkable appearance of the bilateral orbits. Mastoid air cells are clear. No significant paranasal sinus disease No acute intracranial hemorrhage.  No midline shift or mass effect. Volume loss with expansion of the ventricles, not out of  proportion to the degree of sulcal prominence. Encephalomalacia of the left occipital region compatible with remote infarction. Gray-white differentiation maintained. Intracranial atherosclerosis. IMPRESSION: No CT evidence of acute intracranial abnormality. Changes of chronic microvascular ischemic disease, as well as encephalomalacia of prior left occipital infarction. Signed, Dulcy Fanny. Earleen Newport, DO [facility] Radiology Electronically Signed   By: Corrie Mckusick D.O.   On: 06/18/2015 15:10   I have personally reviewed and evaluated these images and lab results as part of my medical decision-making.   EKG Interpretation   Date/Time:  Saturday Jun 18 2015 13:24:32 EDT Ventricular Rate:  73 PR Interval:  154 QRS Duration: 92 QT Interval:  446 QTC Calculation: 491 R Axis:   15 Text Interpretation:  Sinus rhythm Borderline T abnormalities, anterior  leads Borderline prolonged QT interval since last tracing no significant  change Confirmed by Eulis Foster  MD, Vira Agar [id number]) on 06/18/2015 1:29:55 PM  Also confirmed by Eulis Foster  MD, Parrish Bonn [phone]), editor WALKER, CCT, [place]  ([id number])  on 06/18/2015 3:11:01 PM      MDM   Final diagnoses:  Weakness  Confusion   Progressive weakness, despite treatment, currently with antibiotic for UTI. No acute CVA, on CT. She will need advanced imaging with MR, doubt sepsis, metabolic instability or impending vascular collapse.  Nursing Notes Reviewed/ Care Coordinated, and agree without changes. Applicable Imaging Reviewed.  Interpretation of Laboratory Data incorporated into ED treatment  Plan: Admit    Daleen Bo, MD 06/18/15 [phone]

## 2015-06-18 NOTE — Progress Notes (Addendum)
ANTICOAGULATION CONSULT NOTE - Initial Consult  Pharmacy Consult for heparin Indication: atrial fibrillation and acute on chronic CVA  Allergies  Allergen Reactions  . Trazodone And Nefazodone Other (See Comments)    hallucinations    Patient Measurements: Weight: 170 lb 3.2 oz (77.202 kg)  Vital Signs: Temp: 98.2 F (36.8 C) (05/20 1815) Temp Source: Oral (05/20 1815) BP: 152/72 mmHg (05/20 1815) Pulse Rate: 67 (05/20 1815)  Labs:  Recent Labs (last 2 labs)      Recent Labs  06/18/15 1342 06/18/15 1350  HGB 12.6 13.9  HCT 38.0 41.0  PLT 233 --   APTT 28 --   LABPROT 21.8* --   INR 1.91* --   CREATININE 1.51* 1.40*      Estimated Creatinine Clearance: 29.4 mL/min (by C-G formula based on Cr of 1.4).   Medical History: Past Medical History  Diagnosis Date  . GLUCOSE INTOLERANCE 02/11/2007  . HYPERLIPIDEMIA 02/11/2007  . BLINDNESS, [place], [place] DEFINITION 11/25/2006  . Other specified forms of hearing loss 09/05/2009  . HYPERTENSION 02/11/2007  . Atrial fibrillation ([place]) 03/07/2009  . GERD 02/11/2007  . LOW BACK PAIN, CHRONIC 11/25/2006  . BACK PAIN 12/10/2007  . Pain in Soft Tissues of Limb 02/11/2007  . OSTEOPENIA 02/11/2007  . FATIGUE 12/10/2007  . GAIT DISTURBANCE 12/10/2007  . DYSPNEA 03/18/2009  . Cough 03/07/2009  . CHEST PAIN 12/10/2007  . Abdominal pain, left lower quadrant 04/01/2007  . BREAST CANCER, HX OF 11/25/2006  . Nocturia 05/18/2010  . Hypersomnia 05/18/2010  . Impaired glucose tolerance 05/18/2010  . Allergic rhinitis, cause unspecified 07/23/2012  . Anxiety state, unspecified 02/04/2013  . Cancer ([place])   . Renal insufficiency 06/18/2015   Assessment: 83 yof on chronic apixaban for history of afib and also with a possible acute on chronic stroke. Baseline aPTT is 28 and INR is elevated at 1.91. CBC is WNL.   Goal of Therapy:  Heparin  level 0.3-0.5 units/ml aPTT 66-85 seconds Monitor platelets by anticoagulation protocol: Yes  Plan:  - Check a baseline heparin level - Heparin gtt 800 units/hr - Check an 8 hour heparin level and aPTT - Daily heparin level, CBC and aPTT  Rumbarger, Rande Lawman 06/18/2015,9:00 PM

## 2015-06-19 ENCOUNTER — Encounter (HOSPITAL_COMMUNITY): Payer: Self-pay

## 2015-06-19 DIAGNOSIS — E7439 Other disorders of intestinal carbohydrate absorption: Secondary | ICD-10-CM | POA: Diagnosis present

## 2015-06-19 DIAGNOSIS — M8580 Other specified disorders of bone density and structure, unspecified site: Secondary | ICD-10-CM | POA: Diagnosis present

## 2015-06-19 DIAGNOSIS — G20 Parkinson's disease: Secondary | ICD-10-CM

## 2015-06-19 DIAGNOSIS — F0280 Dementia in other diseases classified elsewhere without behavioral disturbance: Secondary | ICD-10-CM

## 2015-06-19 DIAGNOSIS — Z9011 Acquired absence of right breast and nipple: Secondary | ICD-10-CM | POA: Diagnosis not present

## 2015-06-19 DIAGNOSIS — I130 Hypertensive heart and chronic kidney disease with heart failure and stage 1 through stage 4 chronic kidney disease, or unspecified chronic kidney disease: Secondary | ICD-10-CM | POA: Diagnosis present

## 2015-06-19 DIAGNOSIS — R410 Disorientation, unspecified: Secondary | ICD-10-CM | POA: Diagnosis not present

## 2015-06-19 DIAGNOSIS — E785 Hyperlipidemia, unspecified: Secondary | ICD-10-CM | POA: Diagnosis present

## 2015-06-19 DIAGNOSIS — I4581 Long QT syndrome: Secondary | ICD-10-CM | POA: Diagnosis present

## 2015-06-19 DIAGNOSIS — G9340 Encephalopathy, unspecified: Secondary | ICD-10-CM | POA: Diagnosis present

## 2015-06-19 DIAGNOSIS — I5033 Acute on chronic diastolic (congestive) heart failure: Secondary | ICD-10-CM | POA: Diagnosis present

## 2015-06-19 DIAGNOSIS — Z823 Family history of stroke: Secondary | ICD-10-CM | POA: Diagnosis not present

## 2015-06-19 DIAGNOSIS — R1312 Dysphagia, oropharyngeal phase: Secondary | ICD-10-CM | POA: Diagnosis not present

## 2015-06-19 DIAGNOSIS — Z853 Personal history of malignant neoplasm of breast: Secondary | ICD-10-CM | POA: Diagnosis not present

## 2015-06-19 DIAGNOSIS — R7302 Impaired glucose tolerance (oral): Secondary | ICD-10-CM | POA: Diagnosis present

## 2015-06-19 DIAGNOSIS — I959 Hypotension, unspecified: Secondary | ICD-10-CM | POA: Diagnosis present

## 2015-06-19 DIAGNOSIS — N3000 Acute cystitis without hematuria: Secondary | ICD-10-CM | POA: Diagnosis present

## 2015-06-19 DIAGNOSIS — R269 Unspecified abnormalities of gait and mobility: Secondary | ICD-10-CM

## 2015-06-19 DIAGNOSIS — I69351 Hemiplegia and hemiparesis following cerebral infarction affecting right dominant side: Secondary | ICD-10-CM | POA: Diagnosis not present

## 2015-06-19 DIAGNOSIS — G3183 Dementia with Lewy bodies: Secondary | ICD-10-CM

## 2015-06-19 DIAGNOSIS — R1313 Dysphagia, pharyngeal phase: Secondary | ICD-10-CM | POA: Diagnosis present

## 2015-06-19 DIAGNOSIS — G8191 Hemiplegia, unspecified affecting right dominant side: Secondary | ICD-10-CM | POA: Diagnosis present

## 2015-06-19 DIAGNOSIS — R4182 Altered mental status, unspecified: Secondary | ICD-10-CM | POA: Diagnosis not present

## 2015-06-19 DIAGNOSIS — K219 Gastro-esophageal reflux disease without esophagitis: Secondary | ICD-10-CM | POA: Diagnosis present

## 2015-06-19 DIAGNOSIS — M545 Low back pain: Secondary | ICD-10-CM | POA: Diagnosis present

## 2015-06-19 DIAGNOSIS — I69391 Dysphagia following cerebral infarction: Secondary | ICD-10-CM | POA: Diagnosis not present

## 2015-06-19 DIAGNOSIS — R531 Weakness: Secondary | ICD-10-CM | POA: Diagnosis present

## 2015-06-19 DIAGNOSIS — I480 Paroxysmal atrial fibrillation: Secondary | ICD-10-CM | POA: Diagnosis present

## 2015-06-19 DIAGNOSIS — R1310 Dysphagia, unspecified: Secondary | ICD-10-CM

## 2015-06-19 DIAGNOSIS — Z7901 Long term (current) use of anticoagulants: Secondary | ICD-10-CM | POA: Diagnosis not present

## 2015-06-19 DIAGNOSIS — I63532 Cerebral infarction due to unspecified occlusion or stenosis of left posterior cerebral artery: Secondary | ICD-10-CM | POA: Diagnosis not present

## 2015-06-19 DIAGNOSIS — Z66 Do not resuscitate: Secondary | ICD-10-CM | POA: Diagnosis present

## 2015-06-19 DIAGNOSIS — R1311 Dysphagia, oral phase: Secondary | ICD-10-CM | POA: Diagnosis present

## 2015-06-19 DIAGNOSIS — Z87891 Personal history of nicotine dependence: Secondary | ICD-10-CM | POA: Diagnosis not present

## 2015-06-19 DIAGNOSIS — N183 Chronic kidney disease, stage 3 (moderate): Secondary | ICD-10-CM | POA: Diagnosis present

## 2015-06-19 DIAGNOSIS — N289 Disorder of kidney and ureter, unspecified: Secondary | ICD-10-CM | POA: Diagnosis present

## 2015-06-19 DIAGNOSIS — I639 Cerebral infarction, unspecified: Principal | ICD-10-CM

## 2015-06-19 DIAGNOSIS — E860 Dehydration: Secondary | ICD-10-CM | POA: Diagnosis present

## 2015-06-19 DIAGNOSIS — H548 Legal blindness, as defined in USA: Secondary | ICD-10-CM | POA: Diagnosis present

## 2015-06-19 DIAGNOSIS — I69311 Memory deficit following cerebral infarction: Secondary | ICD-10-CM | POA: Diagnosis not present

## 2015-06-19 DIAGNOSIS — G8929 Other chronic pain: Secondary | ICD-10-CM | POA: Diagnosis present

## 2015-06-19 LAB — CBC WITH DIFFERENTIAL/PLATELET
BASOS ABS: 0 10*3/uL (ref 0.0–0.1)
BASOS PCT: 1 %
EOS PCT: 6 %
Eosinophils Absolute: 0.4 10*3/uL (ref 0.0–0.7)
HEMATOCRIT: 37.2 % (ref 36.0–46.0)
Hemoglobin: 12.3 g/dL (ref 12.0–15.0)
Lymphocytes Relative: 17 %
Lymphs Abs: 1.1 10*3/uL (ref 0.7–4.0)
MCH: 30.2 pg (ref 26.0–34.0)
MCHC: 33.1 g/dL (ref 30.0–36.0)
MCV: 91.4 fL (ref 78.0–100.0)
Monocytes Absolute: 0.7 10*3/uL (ref 0.1–1.0)
Monocytes Relative: 11 %
NEUTROS ABS: 4.4 10*3/uL (ref 1.7–7.7)
Neutrophils Relative %: 65 %
PLATELETS: 232 10*3/uL (ref 150–400)
RBC: 4.07 MIL/uL (ref 3.87–5.11)
RDW: 13.9 % (ref 11.5–15.5)
WBC: 6.7 10*3/uL (ref 4.0–10.5)

## 2015-06-19 LAB — RAPID URINE DRUG SCREEN, HOSP PERFORMED
AMPHETAMINES: NOT DETECTED
BARBITURATES: NOT DETECTED
BENZODIAZEPINES: NOT DETECTED
COCAINE: NOT DETECTED
Opiates: NOT DETECTED
TETRAHYDROCANNABINOL: NOT DETECTED

## 2015-06-19 LAB — COMPREHENSIVE METABOLIC PANEL
ALBUMIN: 3.2 g/dL — AB (ref 3.5–5.0)
ALK PHOS: 49 U/L (ref 38–126)
ALT: 46 U/L (ref 14–54)
ANION GAP: 14 (ref 5–15)
AST: 54 U/L — ABNORMAL HIGH (ref 15–41)
BILIRUBIN TOTAL: 0.6 mg/dL (ref 0.3–1.2)
BUN: 16 mg/dL (ref 6–20)
CALCIUM: 8.6 mg/dL — AB (ref 8.9–10.3)
CO2: 16 mmol/L — ABNORMAL LOW (ref 22–32)
Chloride: 108 mmol/L (ref 101–111)
Creatinine, Ser: 1.36 mg/dL — ABNORMAL HIGH (ref 0.44–1.00)
GFR calc Af Amer: 41 mL/min — ABNORMAL LOW (ref >60)
GFR, EST NON AFRICAN AMERICAN: 36 mL/min — AB (ref >60)
GLUCOSE: 87 mg/dL (ref 65–99)
Potassium: 3.7 mmol/L (ref 3.5–5.1)
Sodium: 138 mmol/L (ref 135–145)
TOTAL PROTEIN: 6.3 g/dL — AB (ref 6.5–8.1)

## 2015-06-19 LAB — APTT
APTT: 98 s — AB (ref 24–37)
aPTT: 59 seconds — ABNORMAL HIGH (ref 24–37)

## 2015-06-19 LAB — RPR: RPR: NONREACTIVE

## 2015-06-19 LAB — URINALYSIS, ROUTINE W REFLEX MICROSCOPIC
BILIRUBIN URINE: NEGATIVE
GLUCOSE, UA: NEGATIVE mg/dL
HGB URINE DIPSTICK: NEGATIVE
KETONES UR: 15 mg/dL — AB
Leukocytes, UA: NEGATIVE
Nitrite: NEGATIVE
PROTEIN: NEGATIVE mg/dL
Specific Gravity, Urine: 1.019 (ref 1.005–1.030)
pH: 6 (ref 5.0–8.0)

## 2015-06-19 LAB — HEPARIN LEVEL (UNFRACTIONATED)

## 2015-06-19 LAB — TSH: TSH: 2.153 u[IU]/mL (ref 0.350–4.500)

## 2015-06-19 MED ORDER — SODIUM CHLORIDE 0.9 % IV SOLN: INTRAVENOUS | Status: DC

## 2015-06-19 MED ORDER — DEXTROSE-NACL 5-0.45 % IV SOLN
INTRAVENOUS | Status: DC
  Administered 2015-06-19 – 2015-06-20 (×2): via INTRAVENOUS

## 2015-06-19 MED ORDER — METOPROLOL TARTRATE 5 MG/5ML IV SOLN
5.0000 mg | INTRAVENOUS | Status: DC | PRN
  Administered 2015-06-20: 5 mg via INTRAVENOUS
  Filled 2015-06-19 (×2): qty 5

## 2015-06-19 NOTE — Progress Notes (Signed)
ST paged again to see when patient will be evaluated for swallow, awaiting a response

## 2015-06-19 NOTE — Progress Notes (Addendum)
PROGRESS NOTE                                                                                                                                                                                                             Patient Demographics:    Rose Sullivan, is a 82 y.o. female, DOB - March 12, 1933, [id number]  Admit date - 06/18/2015   Admitting Physician Jani Gravel, MD  Outpatient Primary MD for the patient is [name], MD  LOS -   Chief Complaint  Patient presents with  . Extremity Weakness       Brief Narrative     Rose Sullivan is a 82 y.o. female, w/ CVA with residual right sided weakness, Pafib, Glucose intolerance, apparently per family had altered mental status. Pt is unable to talk like she normally does. Pt seems more somnolent. Denies headache, cp, palp sob, lower ext edema. . Pt has had recent UTI. Pt has been taking Bactrim DS, which is new to her. Pt was brought to ED by her family, daughter. CT brain negative for any acute process.    Subjective:    Rose Sullivan today In bed, nonverbal, denies any headache which she covered, no chest or abdominal pain. Continues to have right-sided weakness. Now has problems swallowing food or liquids, baseline dysarthria from her previous stroke.   Assessment  & Plan :     1.Encephalopathy due to extension of her left occipital stroke. Apparently this will be her third neuro ischemic event in the last few months. She has had right-sided hemiparesis from previous strokes, MRI now shows extension of her left occipital infarct. She has history of paroxysmal atrial fibrillation and recent full stroke workup. Was on Eliquis and currently on heparin drip. Neuro consulted. Any further workup or neuro.  2. Dysphagia due to stroke extension. Speech therapy to evaluate, family does not want to feet, gentle hydration for now.  3. Recent UTI. UA unremarkable for  now stop Bactrim.  4. Dyslipidemia. On statin once taking orally.  5. Paroxysmal atrial fibrillation. Currently on heparin drip instead of Eliquis as she has dysphagia, will place on as needed IV Lopressor, oral diltiazem and she is able to tolerate.  6. CKD stage III. Baseline creatinine close to 1.5. Monitor.    Code Status :  DNR  Family Communication  :  Daughter over bedside  Disposition Plan  : We decided but most likely SNF  Consults  :  Neuro  Procedures  :   MRI brain. Extension of left occipital infarct.  DVT Prophylaxis  : Heparin   Lab Results  Component Value Date   PLT 232 06/19/2015    Inpatient Medications  Scheduled Meds: . diltiazem  240 mg Oral Daily  . exemestane  25 mg Oral Daily  . metoprolol  2.5 mg Intravenous Q12H  . pravastatin  40 mg Oral q1800  . sodium chloride flush  3 mL Intravenous Q12H   Continuous Infusions: . dextrose 5 % and 0.45% NaCl    . heparin 900 Units/hr (06/19/15 0801)   PRN Meds:.[DISCONTINUED] acetaminophen **OR** acetaminophen  Antibiotics  :    Anti-infectives    Start     Dose/Rate Route Frequency Ordered Stop   06/18/15 2200  sulfamethoxazole-trimethoprim (BACTRIM DS,SEPTRA DS) 800-160 MG per tablet 1 tablet  Status:  Discontinued     1 tablet Oral 2 times daily 06/18/15 1823 06/19/15 1048   06/18/15 2100  cefTRIAXone (ROCEPHIN) 1 g in dextrose 5 % 50 mL IVPB     1 g 100 mL/hr over 30 Minutes Intravenous  Once 06/18/15 2020 06/19/15 0127         Objective:   Filed Vitals:   06/19/15 0200 06/19/15 0400 06/19/15 0600 06/19/15 0942  BP: 168/84 148/90 160/65 160/75  Pulse: 79 82 78 81  Temp: 98.3 F (36.8 C) 98.2 F (36.8 C) 98.2 F (36.8 C) 98.1 F (36.7 C)  TempSrc: Axillary Axillary Axillary Axillary  Resp: 18 18 18 18   Weight:      SpO2: 97% 97% 97% 97%    Wt Readings from Last 3 Encounters:  06/18/15 77.202 kg (170 lb 3.2 oz)  06/14/15 74.844 kg (165 lb)  06/10/15 80.06 kg (176 lb 8 oz)     No intake or output data in the 24 hours ending 06/19/15 1049   Physical Exam  Awake , Chronic right-sided weakness, also has dysarthria and dysphagia now [place].AT,PERRAL Supple Neck,No JVD, No cervical lymphadenopathy appriciated.  Symmetrical Chest wall movement, Good air movement bilaterally, CTAB RRR,No Gallops,Rubs or new Murmurs, No Parasternal Heave +ve B.Sounds, Abd Soft, No tenderness, No organomegaly appriciated, No rebound - guarding or rigidity. No Cyanosis, Clubbing or edema, No new Rash or bruise      Data Review:    CBC  Recent Labs Lab 06/14/15 1410 06/18/15 1342 06/18/15 1350 06/19/15 0519  WBC 10.0  10.0 6.8  --  6.7  HGB 13.7  13.7 12.6 13.9 12.3  HCT 40.4  40.4 38.0 41.0 37.2  PLT 280.0  280.0 233  --  232  MCV 92.1  92.1 91.1  --  91.4  MCH  --  30.2  --  30.2  MCHC 33.8  33.8 33.2  --  33.1  RDW 14.4  14.4 13.6  --  13.9  LYMPHSABS 1.5 1.3  --  1.1  MONOABS 0.8 0.8  --  0.7  EOSABS 0.2 0.3  --  0.4  BASOSABS 0.1 0.0  --  0.0    Chemistries   Recent Labs Lab 06/14/15 1410 06/18/15 1342 06/18/15 1350 06/19/15 0519  NA 141  141 138 140 138  K 3.7  3.7 3.7 3.6 3.7  CL 108  108 109 108 108  CO2 21  21 19*  --  16*  GLUCOSE 101*  101* 110* 103* 87  BUN 21  21 17 19 16   CREATININE 1.72*  1.72* 1.51* 1.40* 1.36*  CALCIUM 9.3  9.3 8.6*  --  8.6*  AST 36 56*  --  54*  ALT 27 45  --  46  ALKPHOS 57 51  --  49  BILITOT 0.9 0.7  --  0.6   ------------------------------------------------------------------------------------------------------------------ No results for input(s): CHOL, HDL, LDLCALC, TRIG, CHOLHDL, LDLDIRECT in the last 72 hours.  Lab Results  Component Value Date   HGBA1C 5.7* 03/14/2015   ------------------------------------------------------------------------------------------------------------------  Recent Labs  06/19/15 0519  TSH 2.153    ------------------------------------------------------------------------------------------------------------------  Recent Labs  06/18/15 1840  VITAMINB12 195    Coagulation profile  Recent Labs Lab 06/18/15 1342  INR 1.91*    No results for input(s): DDIMER in the last 72 hours.  Cardiac Enzymes No results for input(s): CKMB, TROPONINI, MYOGLOBIN in the last 168 hours.  Invalid input(s): CK ------------------------------------------------------------------------------------------------------------------ No results found for: BNP  Micro Results Recent Results (from the past 240 hour(s))  Urine culture     Status: None   Collection Time: 06/10/15  4:31 PM  Result Value Ref Range Status   Culture ESCHERICHIA COLI  Final   Colony Count >=100,000 COLONIES/ML  Final   Organism ID, Bacteria ESCHERICHIA COLI  Final      Susceptibility   Escherichia coli -  (no method available)    AMPICILLIN >=32 Resistant     AMOX/CLAVULANIC 16 Intermediate     AMPICILLIN/SULBACTAM >=32 Resistant     PIP/TAZO <=4 Sensitive     IMIPENEM <=0.25 Sensitive     CEFAZOLIN <=4 Not Reportable     CEFTRIAXONE <=1 Sensitive     CEFTAZIDIME <=1 Sensitive     CEFEPIME <=1 Sensitive     GENTAMICIN <=1 Sensitive     TOBRAMYCIN <=1 Sensitive     CIPROFLOXACIN >=4 Resistant     LEVOFLOXACIN >=8 Resistant     NITROFURANTOIN <=16 Sensitive     TRIMETH/SULFA* <=20 Sensitive      * NR=NOT REPORTABLE,SEE COMMENTORAL therapy:A cefazolin MIC of <32 predicts susceptibility to the oral agents cefaclor,cefdinir,cefpodoxime,cefprozil,cefuroxime,cephalexin,and loracarbef when used for therapy of uncomplicated UTIs due to E.coli,K.pneumomiae,and P.mirabilis. PARENTERAL therapy: A cefazolinMIC of >8 indicates resistance to parenteralcefazolin. An alternate test method must beperformed to confirm susceptibility to parenteralcefazolin.    Radiology Reports Dg Chest 1 View  06/10/2015  CLINICAL DATA:  Per family,  pt having sob x 2 weeks. Also recently diagnosed and treated for UTI. Only one view chest done, pt in wheelchair without removable arms and could not stand for images. EXAM: CHEST 1 VIEW COMPARISON:  03/13/2015 FINDINGS: Heart size is within normal limits. Small right pleural effusion is present. There are no focal consolidations. No pulmonary edema. IMPRESSION: Small right pleural effusion. Electronically Signed   By: Nolon Nations M.D.   On: 06/10/2015 18:08   Dg Chest 2 View  06/18/2015  CLINICAL DATA:  82 year old who woke up this morning with right-sided weakness. Personal history of breast cancer. Current history of hypertension and atrial fibrillation. EXAM: CHEST  2 VIEW COMPARISON:  06/10/2015 and earlier. FINDINGS: AP semi-erect and lateral images were obtained. Suboptimal inspiration accounts for crowded bronchovascular markings, especially in the bases, and accentuates the cardiac silhouette. Taking this into account, cardiac silhouette normal in size. Lungs clear. Bronchovascular markings normal. Pulmonary vascularity normal. No visible pleural effusions. Stable chronic elevation of the right hemidiaphragm. Bone infarcts in the proximal bilateral humeri. Degenerative changes throughout the thoracic spine. IMPRESSION: Suboptimal inspiration.  No  acute cardiopulmonary disease. Electronically Signed   By: Evangeline Dakin M.D.   On: 06/18/2015 18:44   Ct Head Wo Contrast  06/18/2015  CLINICAL DATA:  82 year old female with a history of right-sided weakness EXAM: CT HEAD WITHOUT CONTRAST TECHNIQUE: Contiguous axial images were obtained from the base of the skull through the vertex without intravenous contrast. COMPARISON:  MR 04/12/2015, CT head 04/11/2015 FINDINGS: Unremarkable appearance of the calvarium without acute fracture or aggressive lesion. Unremarkable appearance of the scalp soft tissues. Unremarkable appearance of the bilateral orbits. Mastoid air cells are clear. No significant  paranasal sinus disease No acute intracranial hemorrhage.  No midline shift or mass effect. Volume loss with expansion of the ventricles, not out of proportion to the degree of sulcal prominence. Encephalomalacia of the left occipital region compatible with remote infarction. Gray-white differentiation maintained. Intracranial atherosclerosis. IMPRESSION: No CT evidence of acute intracranial abnormality. Changes of chronic microvascular ischemic disease, as well as encephalomalacia of prior left occipital infarction. Signed, Dulcy Fanny. Earleen Newport, DO [facility] Radiology Electronically Signed   By: Corrie Mckusick D.O.   On: 06/18/2015 15:10   Mr Brain Wo Contrast  06/18/2015  CLINICAL DATA:  Increased right-sided weakness. Last seen well yesterday. Previous infarcts. EXAM: MRI HEAD WITHOUT CONTRAST TECHNIQUE: Multiplanar, multiecho pulse sequences of the brain and surrounding structures were obtained without intravenous contrast. COMPARISON:  CT head without contrast 06/18/2015. MRI brain 04/12/2015. FINDINGS: Focal restricted diffusion at the occipital pole represents minimal extension of the previous infarct. There is otherwise expected evolution of the previously noted left PCA territory infarct. Advanced atrophy and diffuse white matter disease is otherwise stable. The ventricles are proportionate to the degree of atrophy. No significant extra-axial fluid collection is present. The internal auditory canals are normal bilaterally. Flow is present in the major intracranial arteries. Bilateral lens replacements are present. The globes and orbits are intact. The paranasal sinuses and the mastoid air cells are clear. The skullbase is within normal limits. Midline sagittal images are unremarkable. IMPRESSION: 1. Slight extension with acute on chronic infarct of the left occipital pole. 2. No other acute infarct. 3. Expected evolution of left occipital lobe infarct  otherwise. 4. Stable advanced atrophy and diffuse white matter disease reflecting the sequela of chronic microvascular ischemia. Electronically Signed   By: San Morelle M.D.   On: 06/18/2015 17:47    Time Spent in minutes  30   Kasy Iannacone K M.D on 06/19/2015 at 10:49 AM  Between 7am to 7pm - Pager - [phone]  After 7pm go to [email or web address] - password [facility]  [facility] -  Office  [phone]

## 2015-06-19 NOTE — Evaluation (Signed)
Clinical/Bedside Swallow Evaluation Patient Details  Name: Rose Sullivan MRN: [id number] Date of Birth: 10-Sep-1933  Today's Date: 06/19/2015 Time: SLP Start Time (ACUTE ONLY): [id number] SLP Stop Time (ACUTE ONLY): 1500 SLP Time Calculation (min) (ACUTE ONLY): 18 min  Past Medical History:  Past Medical History  Diagnosis Date  . GLUCOSE INTOLERANCE 02/11/2007  . HYPERLIPIDEMIA 02/11/2007  . BLINDNESS, [place], [place] DEFINITION 11/25/2006  . Other specified forms of hearing loss 09/05/2009  . HYPERTENSION 02/11/2007  . Atrial fibrillation ([place]) 03/07/2009  . GERD 02/11/2007  . LOW BACK PAIN, CHRONIC 11/25/2006  . BACK PAIN 12/10/2007  . Pain in Soft Tissues of Limb 02/11/2007  . OSTEOPENIA 02/11/2007  . FATIGUE 12/10/2007  . GAIT DISTURBANCE 12/10/2007  . DYSPNEA 03/18/2009  . Cough 03/07/2009  . CHEST PAIN 12/10/2007  . Abdominal pain, left lower quadrant 04/01/2007  . BREAST CANCER, HX OF 11/25/2006  . Nocturia 05/18/2010  . Hypersomnia 05/18/2010  . Impaired glucose tolerance 05/18/2010  . Allergic rhinitis, cause unspecified 07/23/2012  . Anxiety state, unspecified 02/04/2013  . Cancer ([place])   . Renal insufficiency 06/18/2015   Past Surgical History:  Past Surgical History  Procedure Laterality Date  . Dilation and curettage of uterus    . S/p mastectomy Right   . Appendectomy    . Cataract extraction    . Knee surgery Right    HPI:  82 y.o. female with CVA with residual right sided weakness, legal blindness, chronic low back pain, HTN, Pafib, Glucose intolerance, apparently per family had altered mental status and unable to talk like  normally.Per chart has had recent UTI.MRI acute on chronic infarct of the left occipital pole. CT brain negative for any acute process.As SLP documenting, neurologist reported new diagnosis of Parkinson's. CXR no acute cardiopulmonary disease. MBS 04/13/15 recommended Dys 2, nectar liquids and daughter reports pt able to return to regular texture and thin  liquids. Daughter also reports coughing/strangling with po's several days prior to admit.   Assessment / Plan / Recommendation Clinical Impression  Pt exhibited a discoordinated, loud swallow, suspected delayed initiation and probable aspiration with thin liquid (very reddened face/eyes). Impairments less significant with puree although history of dysphagia/thick liquids, clinical findings and daughter report of recent coughing with po's, recommend continue NPO with MBS next date.      Aspiration Risk  Severe aspiration risk    Diet Recommendation NPO        Other  Recommendations Oral Care Recommendations: Oral care QID   Follow up Recommendations   (TBD)    Frequency and Duration min 2x/week  2 weeks       Prognosis Prognosis for Safe Diet Advancement: Fair Barriers to Reach Goals: Cognitive deficits;Severity of deficits      Swallow Study   General HPI: 82 y.o. female with CVA with residual right sided weakness, legal blindness, chronic low back pain, HTN, Pafib, Glucose intolerance, apparently per family had altered mental status and unable to talk like  normally.Per chart has had recent UTI.MRI acute on chronic infarct of the left occipital pole. CT brain negative for any acute process.As SLP documenting, neurologist reported new diagnosis of Parkinson's. CXR no acute cardiopulmonary disease. MBS 04/13/15 recommended Dys 2, nectar liquids and daughter reports pt able to return to regular texture and thin liquids. Daughter also reports coughing/strangling with po's several days prior to admit. Type of Study: Bedside Swallow Evaluation Previous Swallow Assessment:  (see HPI) Diet Prior to this Study: NPO Temperature Spikes Noted: No Respiratory  Status: Room air History of Recent Intubation: No Behavior/Cognition: Alert;Cooperative;Distractible;Requires cueing Oral Cavity Assessment: Dry;Dried secretions Oral Care Completed by SLP: Yes Oral Cavity - Dentition:  (missing  upper, has some lower) Vision: Impaired for self-feeding Self-Feeding Abilities: Needs set up;Needs assist Patient Positioning: Upright in bed Baseline Vocal Quality: Low vocal intensity Volitional Cough: Weak Volitional Swallow: Able to elicit    Oral/Motor/Sensory Function Overall Oral Motor/Sensory Function: Generalized oral weakness   Ice Chips Ice chips: Not tested   Thin Liquid Thin Liquid: Impaired Presentation: Cup Oral Phase Impairments: Reduced lingual movement/coordination Pharyngeal  Phase Impairments: Suspected delayed Swallow;Decreased hyoid-laryngeal movement (loud "cluncky" swallow, decr coordination, red face, decr re)    Nectar Thick Nectar Thick Liquid: Not tested   Honey Thick Honey Thick Liquid: Not tested   Puree Puree: Impaired Pharyngeal Phase Impairments: Suspected delayed Swallow;Decreased hyoid-laryngeal movement (audible swallow)   Solid      Solid: Not tested        Houston Siren 06/19/2015,3:23 PM [phone]

## 2015-06-19 NOTE — Progress Notes (Signed)
ANTICOAGULATION CONSULT NOTE - Follow Up Consult  Pharmacy Consult for heparin Indication: atrial fibrillation and acute on chronic CVA  Allergies  Allergen Reactions  . Trazodone And Nefazodone Other (See Comments)    hallucinations    Patient Measurements: Weight: 170 lb 3.2 oz (77.202 kg)  Vital Signs: Temp: 98.2 F (36.8 C) (05/21 0600) Temp Source: Axillary (05/21 0600) BP: 160/65 mmHg (05/21 0600) Pulse Rate: 78 (05/21 0600)  Labs:  Recent Labs  06/18/15 1342 06/18/15 1350 06/18/15 2110 06/19/15 0519  HGB 12.6 13.9  --  12.3  HCT 38.0 41.0  --  37.2  PLT 233  --   --  232  APTT 28  --   --  59*  LABPROT 21.8*  --   --   --   INR 1.91*  --   --   --   HEPARINUNFRC  --   --  >2.20*  --   CREATININE 1.51* 1.40*  --  1.36*    Estimated Creatinine Clearance: 30.3 mL/min (by C-G formula based on Cr of 1.36).  Assessment: 10 yof on chronic apixaban for history of afib and also with a possible acute on chronic stroke. Baseline aPTT is 28, and baseline HL influenced by PTA apixaban. INR is elevated at 1.91.  -aPTT 59 sec, CBC stable.   Goal of Therapy:  Heparin level 0.3-0.5 units/ml aPTT 66-85 seconds Monitor platelets by anticoagulation protocol: Yes  Plan:  - Inc heparin gtt to 900 units/hr - Check an 8 hour aPTT - Daily heparin level, CBC and aPTT - Monitor for S&S of bleed  Angela Burke, PharmD Pharmacy Resident Pager: [phone] 06/19/2015,8:03 AM

## 2015-06-19 NOTE — Progress Notes (Signed)
ANTICOAGULATION CONSULT NOTE - Follow Up Consult  Pharmacy Consult for heparin Indication: atrial fibrillation and acute on chronic CVA  Allergies  Allergen Reactions  . Trazodone And Nefazodone Other (See Comments)    hallucinations    Patient Measurements: Weight: 170 lb 3.2 oz (77.202 kg)  Vital Signs: Temp: 97.7 F (36.5 C) (05/21 1356) Temp Source: Axillary (05/21 1356) BP: 154/87 mmHg (05/21 1356) Pulse Rate: 86 (05/21 1356)  Labs:  Recent Labs  06/18/15 1342 06/18/15 1350 06/18/15 2110 06/19/15 0519 06/19/15 1548  HGB 12.6 13.9  --  12.3  --   HCT 38.0 41.0  --  37.2  --   PLT 233  --   --  232  --   APTT 28  --   --  59* 98*  LABPROT 21.8*  --   --   --   --   INR 1.91*  --   --   --   --   HEPARINUNFRC  --   --  >2.20* >2.20*  --   CREATININE 1.51* 1.40*  --  1.36*  --     Estimated Creatinine Clearance: 30.3 mL/min (by C-G formula based on Cr of 1.36).  Assessment: 67 yof on chronic apixaban for history of afib and also with a possible acute on chronic stroke. Baseline aPTT is 28, and baseline HL influenced by PTA apixaban. INR is elevated at 1.91.   APTT resulted at 98 after heparin was increased to 900 units/hr. No issues with infusion at this time.   Goal of Therapy:  Heparin level 0.3-0.5 units/ml aPTT 66-85 seconds Monitor platelets by anticoagulation protocol: Yes  Plan:  - Decrease heparin gtt to 900 units/hr - Check an 8 hour aPTT - Daily heparin level, CBC and aPTT - Monitor for S&S of bleed  Vincenza Hews, PharmD, BCPS 06/19/2015, 5:04 PM Pager: [phone]

## 2015-06-19 NOTE — Progress Notes (Signed)
Paged ST for swallow evaluation for the patient. Awaiting reply.

## 2015-06-19 NOTE — Consult Note (Signed)
Requesting Physician: Dr. Candiss Norse     Reason for consultation:  Acute stroke in the left occipital lobe on brain MRI  HPI:                                                                                                                                         Rose Sullivan is an 82 y.o. female patient who is admitted to the hospital due to altered mental status and generalized weakness symptoms. Her daughter reported poor by mouth intake for the past several days, and was noted to have a urinary tract infection for which she is being treated. Does also been worsening confusion and altered mental status for the past few days. She was previously hospitalized in February and March 2017 with left PCA infarcts on both occasions.  She has atrial fibrillation, currently anticoagulated with eliquis as outpatient. Due to severe dysphagia, she is nothing by mouth and hence started on heparin infusion for therapeutic anticoagulation, being off of her left eliquis,. Patient unable to provide any history. Information obtained from her daughter who is her caregiver.    Past Medical History: Past Medical History  Diagnosis Date  . GLUCOSE INTOLERANCE 02/11/2007  . HYPERLIPIDEMIA 02/11/2007  . BLINDNESS, [place], [place] DEFINITION 11/25/2006  . Other specified forms of hearing loss 09/05/2009  . HYPERTENSION 02/11/2007  . Atrial fibrillation ([place]) 03/07/2009  . GERD 02/11/2007  . LOW BACK PAIN, CHRONIC 11/25/2006  . BACK PAIN 12/10/2007  . Pain in Soft Tissues of Limb 02/11/2007  . OSTEOPENIA 02/11/2007  . FATIGUE 12/10/2007  . GAIT DISTURBANCE 12/10/2007  . DYSPNEA 03/18/2009  . Cough 03/07/2009  . CHEST PAIN 12/10/2007  . Abdominal pain, left lower quadrant 04/01/2007  . BREAST CANCER, HX OF 11/25/2006  . Nocturia 05/18/2010  . Hypersomnia 05/18/2010  . Impaired glucose tolerance 05/18/2010  . Allergic rhinitis, cause unspecified 07/23/2012  . Anxiety state, unspecified 02/04/2013  . Cancer ([place])   . Renal  insufficiency 06/18/2015    Past Surgical History  Procedure Laterality Date  . Dilation and curettage of uterus    . S/p mastectomy Right   . Appendectomy    . Cataract extraction    . Knee surgery Right     Family History: Family History  Problem Relation Age of Onset  . Hypertension Brother   . Lung cancer Brother   . Colon cancer Father   . COPD Brother   . Stroke Mother   . Colon cancer Father     Social History:   reports that she has never smoked. She has never used smokeless tobacco. She reports that she does not drink alcohol or use illicit drugs.  Allergies:  Allergies  Allergen Reactions  . Trazodone And Nefazodone Other (See Comments)    hallucinations     Medications:  Current facility-administered medications:  .  [DISCONTINUED] acetaminophen (TYLENOL) tablet 650 mg, 650 mg, Oral, Q6H PRN **OR** acetaminophen (TYLENOL) suppository 650 mg, 650 mg, Rectal, Q6H PRN, Jani Gravel, MD .  dextrose 5 %-0.45 % sodium chloride infusion, , Intravenous, Continuous, Thurnell Lose, MD, Last Rate: 75 mL/hr at 06/19/15 1158 .  diltiazem (CARDIZEM CD) 24 hr capsule 240 mg, 240 mg, Oral, Daily, Jani Gravel, MD, 240 mg at 06/18/15 2000 .  exemestane (AROMASIN) tablet 25 mg, 25 mg, Oral, Daily, Jani Gravel, MD, 25 mg at 06/18/15 2000 .  heparin ADULT infusion 100 units/mL (25000 units/250 mL), 850 Units/hr, Intravenous, Continuous, Thurnell Lose, MD, Last Rate: 8.5 mL/hr at 06/19/15 1703, 850 Units/hr at 06/19/15 1703 .  metoprolol (LOPRESSOR) injection 5 mg, 5 mg, Intravenous, Q4H PRN, Thurnell Lose, MD .  pravastatin (PRAVACHOL) tablet 40 mg, 40 mg, Oral, q1800, Jani Gravel, MD, 40 mg at 06/19/15 1800 .  sodium chloride flush (NS) 0.9 % injection 3 mL, 3 mL, Intravenous, Q12H, Jani Gravel, MD, 3 mL at 06/19/15 0116   ROS:                                                                                                                                        History obtained from unobtainable from patient due to mental status  Neurologic Examination:                                                                                                    Today's Vitals   06/19/15 0800 06/19/15 0942 06/19/15 1356 06/19/15 1743  BP:  160/75 154/87 176/79  Pulse:  81 86 79  Temp:  98.1 F (36.7 C) 97.7 F (36.5 C) 98 F (36.7 C)  TempSrc:  Axillary Axillary Axillary  Resp:  18 18 18   Weight:      SpO2:  97% 98% 96%  PainSc: Asleep       Evaluation of higher integrative functions including: Level of alertness: Alert,  Oriented to  place and person,. Not to month or year Speech: Severe hypophonia, with encouragement she is able to speak a sentence fluently, no evidence of aphasia noted.  Test the following cranial nerves: 2-12 grossly intact, very difficult to assess for any visual field or shift confrontation testing due to poor cooperation. Patient appeared to have vision in both visual fields at least to some extent with the limited evaluation. Motor examination: Moderate rigidity in bilateral upper and lower extremities, able to  sustain antigravity strength in bilateral upper and lower extremity is to command, without drift, unable to cooperate with resistance testing Examination of sensation : Reports grossly symmetric sensation in all 4 extremities and on face Test coordination: Intermittent resting tremor in bilateral upper extremities, and also resting jaw tremor noted Gait: Not cooperative     Lab Results: Basic Metabolic Panel:  Recent Labs Lab 06/14/15 1410 06/18/15 1342 06/18/15 1350 06/19/15 0519  NA 141  141 138 140 138  K 3.7  3.7 3.7 3.6 3.7  CL 108  108 109 108 108  CO2 21  21 19*  --  16*  GLUCOSE 101*  101* 110* 103* 87  BUN 21  21 17 19 16   CREATININE 1.72*  1.72* 1.51* 1.40* 1.36*  CALCIUM 9.3  9.3 8.6*  --   8.6*    Liver Function Tests:  Recent Labs Lab 06/14/15 1410 06/18/15 1342 06/19/15 0519  AST 36 56* 54*  ALT 27 45 46  ALKPHOS 57 51 49  BILITOT 0.9 0.7 0.6  PROT 7.2 6.3* 6.3*  ALBUMIN 4.2 3.3* 3.2*   No results for input(s): LIPASE, AMYLASE in the last 168 hours.  Recent Labs Lab 06/14/15 1410  AMMONIA 21    CBC:  Recent Labs Lab 06/14/15 1410 06/18/15 1342 06/18/15 1350 06/19/15 0519  WBC 10.0  10.0 6.8  --  6.7  NEUTROABS 7.4 4.3  --  4.4  HGB 13.7  13.7 12.6 13.9 12.3  HCT 40.4  40.4 38.0 41.0 37.2  MCV 92.1  92.1 91.1  --  91.4  PLT 280.0  280.0 233  --  232    Cardiac Enzymes: No results for input(s): CKTOTAL, CKMB, CKMBINDEX, TROPONINI in the last 168 hours.  Lipid Panel: No results for input(s): CHOL, TRIG, HDL, CHOLHDL, VLDL, LDLCALC in the last 168 hours.  CBG: No results for input(s): GLUCAP in the last 168 hours.  Microbiology: Results for orders placed or performed in visit on 06/10/15  Urine culture     Status: None   Collection Time: 06/10/15  4:31 PM  Result Value Ref Range Status   Culture ESCHERICHIA COLI  Final   Colony Count >=100,000 COLONIES/ML  Final   Organism ID, Bacteria ESCHERICHIA COLI  Final      Susceptibility   Escherichia coli -  (no method available)    AMPICILLIN >=32 Resistant     AMOX/CLAVULANIC 16 Intermediate     AMPICILLIN/SULBACTAM >=32 Resistant     PIP/TAZO <=4 Sensitive     IMIPENEM <=0.25 Sensitive     CEFAZOLIN <=4 Not Reportable     CEFTRIAXONE <=1 Sensitive     CEFTAZIDIME <=1 Sensitive     CEFEPIME <=1 Sensitive     GENTAMICIN <=1 Sensitive     TOBRAMYCIN <=1 Sensitive     CIPROFLOXACIN >=4 Resistant     LEVOFLOXACIN >=8 Resistant     NITROFURANTOIN <=16 Sensitive     TRIMETH/SULFA* <=20 Sensitive      * NR=NOT REPORTABLE,SEE COMMENTORAL therapy:A cefazolin MIC of <32 predicts susceptibility to the oral agents cefaclor,cefdinir,cefpodoxime,cefprozil,cefuroxime,cephalexin,and loracarbef  when used for therapy of uncomplicated UTIs due to E.coli,K.pneumomiae,and P.mirabilis. PARENTERAL therapy: A cefazolinMIC of >8 indicates resistance to parenteralcefazolin. An alternate test method must beperformed to confirm susceptibility to parenteralcefazolin.     Imaging: Dg Chest 2 View  06/18/2015  CLINICAL DATA:  82 year old who woke up this morning with right-sided weakness. Personal history of breast cancer. Current history of hypertension and atrial fibrillation. EXAM:  CHEST  2 VIEW COMPARISON:  06/10/2015 and earlier. FINDINGS: AP semi-erect and lateral images were obtained. Suboptimal inspiration accounts for crowded bronchovascular markings, especially in the bases, and accentuates the cardiac silhouette. Taking this into account, cardiac silhouette normal in size. Lungs clear. Bronchovascular markings normal. Pulmonary vascularity normal. No visible pleural effusions. Stable chronic elevation of the right hemidiaphragm. Bone infarcts in the proximal bilateral humeri. Degenerative changes throughout the thoracic spine. IMPRESSION: Suboptimal inspiration.  No acute cardiopulmonary disease. Electronically Signed   By: Evangeline Dakin M.D.   On: 06/18/2015 18:44   Ct Head Wo Contrast  06/18/2015  CLINICAL DATA:  82 year old female with a history of right-sided weakness EXAM: CT HEAD WITHOUT CONTRAST TECHNIQUE: Contiguous axial images were obtained from the base of the skull through the vertex without intravenous contrast. COMPARISON:  MR 04/12/2015, CT head 04/11/2015 FINDINGS: Unremarkable appearance of the calvarium without acute fracture or aggressive lesion. Unremarkable appearance of the scalp soft tissues. Unremarkable appearance of the bilateral orbits. Mastoid air cells are clear. No significant paranasal sinus disease No acute intracranial hemorrhage.  No midline shift or mass effect. Volume loss with expansion of the ventricles, not out of proportion to the degree of sulcal  prominence. Encephalomalacia of the left occipital region compatible with remote infarction. Gray-white differentiation maintained. Intracranial atherosclerosis. IMPRESSION: No CT evidence of acute intracranial abnormality. Changes of chronic microvascular ischemic disease, as well as encephalomalacia of prior left occipital infarction. Signed, Dulcy Fanny. Earleen Newport, DO [facility] Electronically Signed   By: Corrie Mckusick D.O.   On: 06/18/2015 15:10   Mr Brain Wo Contrast  06/18/2015  CLINICAL DATA:  Increased right-sided weakness. Last seen well yesterday. Previous infarcts. EXAM: MRI HEAD WITHOUT CONTRAST TECHNIQUE: Multiplanar, multiecho pulse sequences of the brain and surrounding structures were obtained without intravenous contrast. COMPARISON:  CT head without contrast 06/18/2015. MRI brain 04/12/2015. FINDINGS: Focal restricted diffusion at the occipital pole represents minimal extension of the previous infarct. There is otherwise expected evolution of the previously noted left PCA territory infarct. Advanced atrophy and diffuse white matter disease is otherwise stable. The ventricles are proportionate to the degree of atrophy. No significant extra-axial fluid collection is present. The internal auditory canals are normal bilaterally. Flow is present in the major intracranial arteries. Bilateral lens replacements are present. The globes and orbits are intact. The paranasal sinuses and the mastoid air cells are clear. The skullbase is within normal limits. Midline sagittal images are unremarkable. IMPRESSION: 1. Slight extension with acute on chronic infarct of the left occipital pole. 2. No other acute infarct. 3. Expected evolution of left occipital lobe infarct otherwise. 4. Stable advanced atrophy and diffuse white matter disease reflecting the sequela of chronic microvascular ischemia. Electronically Signed   By: San Morelle M.D.   On:  06/18/2015 17:47      Assessment and plan:   Rose Sullivan is an 82 y.o. female patient who presented with with worsening altered mental status, dysphagia, gait instability. Her clinical evaluation is suggestive of Parkinson's disease based on moderate rigidity in all 4 extremities, resting limber and jaw tremor and severe bradykinesia and hypophonia as described. She also appears to have dementia with likely acute delirium in the setting of UTI and dehydration with poor by mouth intake. MRI of the brain does show a very small left occipital pole acute infarct in the left PCA vascular territory in which she had 2 prior acute infarcts earlier this year. She has a  history of atrial fibrillation which I believe is at risk for cerebral embolic infarcts. But this small left hospital infarct does not explain her dysphagia which I believe, is most likely secondary to advanced Parkinson's disease noted on the clinical exam. Recommend continuing swallow evaluation and further testing,  and discuss with family about further options including placement of a PEG tube for nutrition versus hospice care as her dysphagia is less likely to improve significantly without treatment of underlying extrapyramidal symptoms.  She is on heparin drip for anticoagulation for atrial fibrillation.   Neurology stroke team will follow starting tomorrow morning.

## 2015-06-20 ENCOUNTER — Inpatient Hospital Stay (HOSPITAL_COMMUNITY): Payer: Medicare Other

## 2015-06-20 DIAGNOSIS — I63532 Cerebral infarction due to unspecified occlusion or stenosis of left posterior cerebral artery: Secondary | ICD-10-CM | POA: Insufficient documentation

## 2015-06-20 DIAGNOSIS — N3000 Acute cystitis without hematuria: Secondary | ICD-10-CM | POA: Insufficient documentation

## 2015-06-20 DIAGNOSIS — R531 Weakness: Secondary | ICD-10-CM

## 2015-06-20 DIAGNOSIS — I69311 Memory deficit following cerebral infarction: Secondary | ICD-10-CM | POA: Diagnosis not present

## 2015-06-20 DIAGNOSIS — I69351 Hemiplegia and hemiparesis following cerebral infarction affecting right dominant side: Secondary | ICD-10-CM | POA: Diagnosis not present

## 2015-06-20 DIAGNOSIS — R410 Disorientation, unspecified: Secondary | ICD-10-CM | POA: Insufficient documentation

## 2015-06-20 DIAGNOSIS — R4182 Altered mental status, unspecified: Secondary | ICD-10-CM

## 2015-06-20 DIAGNOSIS — R1312 Dysphagia, oropharyngeal phase: Secondary | ICD-10-CM | POA: Diagnosis not present

## 2015-06-20 DIAGNOSIS — E860 Dehydration: Secondary | ICD-10-CM

## 2015-06-20 DIAGNOSIS — I69391 Dysphagia following cerebral infarction: Secondary | ICD-10-CM | POA: Diagnosis not present

## 2015-06-20 LAB — HEPARIN LEVEL (UNFRACTIONATED)

## 2015-06-20 LAB — GLUCOSE, CAPILLARY
GLUCOSE-CAPILLARY: 142 mg/dL — AB (ref 65–99)
GLUCOSE-CAPILLARY: 160 mg/dL — AB (ref 65–99)
Glucose-Capillary: 122 mg/dL — ABNORMAL HIGH (ref 65–99)
Glucose-Capillary: 107 mg/dL — ABNORMAL HIGH (ref 65–99)

## 2015-06-20 LAB — BASIC METABOLIC PANEL
ANION GAP: 10 (ref 5–15)
BUN: 9 mg/dL (ref 6–20)
CO2: 16 mmol/L — ABNORMAL LOW (ref 22–32)
Calcium: 8.7 mg/dL — ABNORMAL LOW (ref 8.9–10.3)
Chloride: 113 mmol/L — ABNORMAL HIGH (ref 101–111)
Creatinine, Ser: 0.94 mg/dL (ref 0.44–1.00)
GFR, EST NON AFRICAN AMERICAN: 56 mL/min — AB (ref >60)
Glucose, Bld: 115 mg/dL — ABNORMAL HIGH (ref 65–99)
POTASSIUM: 3.4 mmol/L — AB (ref 3.5–5.1)
SODIUM: 139 mmol/L (ref 135–145)

## 2015-06-20 LAB — FOLATE RBC
FOLATE, HEMOLYSATE: 250.8 ng/mL
FOLATE, RBC: 630 ng/mL (ref >498)
HEMATOCRIT: 39.8 % (ref 34.0–46.6)

## 2015-06-20 LAB — LIPID PANEL
CHOL/HDL RATIO: 3.3 ratio
Cholesterol: 109 mg/dL (ref 0–200)
HDL: 33 mg/dL — ABNORMAL LOW (ref >40)
LDL Cholesterol: 59 mg/dL (ref 0–99)
Triglycerides: 84 mg/dL (ref <150)
VLDL: 17 mg/dL (ref 0–40)

## 2015-06-20 LAB — URINE CULTURE: CULTURE: NO GROWTH

## 2015-06-20 LAB — CBC
HCT: 39 % (ref 36.0–46.0)
HEMOGLOBIN: 12.9 g/dL (ref 12.0–15.0)
MCH: 30.7 pg (ref 26.0–34.0)
MCHC: 33.1 g/dL (ref 30.0–36.0)
MCV: 92.9 fL (ref 78.0–100.0)
PLATELETS: 213 10*3/uL (ref 150–400)
RBC: 4.2 MIL/uL (ref 3.87–5.11)
RDW: 13.7 % (ref 11.5–15.5)
WBC: 7.1 10*3/uL (ref 4.0–10.5)

## 2015-06-20 LAB — HEMOGLOBIN A1C
Hgb A1c MFr Bld: 5.4 % (ref 4.8–5.6)
Mean Plasma Glucose: 108 mg/dL

## 2015-06-20 LAB — ANTINUCLEAR ANTIBODIES, IFA: ANTINUCLEAR ANTIBODIES, IFA: NEGATIVE

## 2015-06-20 LAB — APTT
APTT: 65 s — AB (ref 24–37)
aPTT: 99 seconds — ABNORMAL HIGH (ref 24–37)

## 2015-06-20 MED ORDER — SODIUM CHLORIDE 0.9 % IV SOLN: INTRAVENOUS | Status: DC

## 2015-06-20 MED ORDER — POTASSIUM CHLORIDE 20 MEQ/15ML (10%) PO SOLN
40.0000 meq | Freq: Once | ORAL | Status: AC
  Administered 2015-06-20: 40 meq via ORAL
  Filled 2015-06-20: qty 30

## 2015-06-20 MED ORDER — INSULIN ASPART 100 UNIT/ML ~~LOC~~ SOLN
0.0000 [IU] | Freq: Three times a day (TID) | SUBCUTANEOUS | Status: DC
  Administered 2015-06-20: 2 [IU] via SUBCUTANEOUS
  Administered 2015-06-20: 3 [IU] via SUBCUTANEOUS
  Administered 2015-06-20 – 2015-06-22 (×2): 2 [IU] via SUBCUTANEOUS

## 2015-06-20 MED ORDER — APIXABAN 5 MG PO TABS
5.0000 mg | ORAL_TABLET | Freq: Two times a day (BID) | ORAL | Status: DC
  Administered 2015-06-20 – 2015-06-23 (×6): 5 mg via ORAL
  Filled 2015-06-20 (×5): qty 1

## 2015-06-20 NOTE — Progress Notes (Addendum)
ANTICOAGULATION CONSULT NOTE - Follow Up Consult  Pharmacy Consult for heparin --> Eliquis Indication: atrial fibrillation and acute on chronic CVA  Allergies  Allergen Reactions  . Trazodone And Nefazodone Other (See Comments)    hallucinations    Patient Measurements: Weight: 170 lb 3.2 oz (77.202 kg)  Vital Signs: Temp: 97.7 F (36.5 C) (05/22 1123) Temp Source: Oral (05/22 1123) BP: 133/84 mmHg (05/22 1123) Pulse Rate: 86 (05/22 1123)  Labs:  Recent Labs  06/18/15 1342 06/18/15 1350 06/18/15 2110 06/19/15 0519 06/19/15 1548 06/20/15 0417 06/20/15 1237  HGB 12.6 13.9  --  12.3  --  12.9  --   HCT 38.0 41.0  --  37.2  --  39.0  --   PLT 233  --   --  232  --  213  --   APTT 28  --   --  59* 98* 99* 65*  LABPROT 21.8*  --   --   --   --   --   --   INR 1.91*  --   --   --   --   --   --   HEPARINUNFRC  --   --  >2.20* >2.20*  --  >2.20*  --   CREATININE 1.51* 1.40*  --  1.36*  --  0.94  --     Estimated Creatinine Clearance: 43.9 mL/min (by C-G formula based on Cr of 0.94).  Assessment: 46 yof on chronic apixaban for history of afib and also with a possible acute on chronic stroke. Baseline aPTT is 28, and baseline HL influenced by PTA apixaban. INR is elevated at 1.91.   APTT = 65 seconds this PM (therapeutic)  Goal of Therapy:  Heparin level 0.3-0.5 units/ml aPTT 66-85 seconds Monitor platelets by anticoagulation protocol: Yes  Plan:  Continue heparin at 700 units / hr Follow up AM labs  Transitioned back to Eliquis -- 5 mg po BID   Thank you Anette Guarneri, PharmD [phone] 06/20/2015, 1:25 PM

## 2015-06-20 NOTE — Care Management Note (Signed)
Case Management Note  Patient Details  Name: Rose Sullivan MRN: [id number] Date of Birth: Jan 11, 1934  Subjective/Objective:    Pt admitted with weakness. Pt is on Heparin gtt. She is from home with family.                Action/Plan: Need PT/OT recs. CM following for discharge disposition.   Expected Discharge Date:                  Expected Discharge Plan:     In-House Referral:     Discharge planning Services     Post Acute Care Choice:    Choice offered to:     DME Arranged:    DME Agency:     HH Arranged:    HH Agency:     Status of Service:  In process, will continue to follow  Medicare Important Message Given:    Date Medicare IM Given:    Medicare IM give by:    Date Additional Medicare IM Given:    Additional Medicare Important Message give by:     If discussed at [place] of Stay Meetings, dates discussed:    Additional Comments:  Pollie Friar, RN 06/20/2015, 10:48 AM

## 2015-06-20 NOTE — Procedures (Signed)
HPI:  82 y/o with mental status change  TECHNICAL SUMMARY:  A multichannel referential and bipolar montage EEG using the standard international 10-20 system was performed on the patient described as awake and drowsy.  The dominant background activity consists of 11 hertz activity seen most prominantly over the posterior head region.  The backgound activity is reactive to eye opening and closing procedures.  Low voltage fast (beta) activity is distributed symmetrically and maximally over the anterior head regions.  ACTIVATION:  Stepwise photic stimulation and HV is not performed  EPILEPTIFORM ACTIVITY:  There were no spikes, sharp waves or paroxysmal activity.  SLEEP:  Stage 1 and stage 2 sleep are noted  CARDIAC:  The EKG lead revealed an irregular rhythm  IMPRESSION:  This is a normal EEG for the patients stated age.  There were no focal, hemispheric or lateralizing features.  No epileptiform activity was recorded.  A normal EEG does not exclude the diagnosis of a seizure disorder and if seizure remains high on the list of differential diagnosis, an ambulatory EEG may be of value.  Clinical correlation is required.

## 2015-06-20 NOTE — Progress Notes (Signed)
STROKE TEAM PROGRESS NOTE   HISTORY OF PRESENT ILLNESS (per record) Rose Sullivan is an 82 y.o. female patient who is admitted to the hospital due to altered mental status and generalized weakness symptoms. Her daughter reported poor by mouth intake for the past several days, and was noted to have a urinary tract infection for which she is being treated. Also with worsening confusion and altered mental status for the past few days. She was previously hospitalized in February and March 2017 with left PCA infarcts on both occasions.  She has atrial fibrillation, currently anticoagulated with eliquis as outpatient. Due to severe dysphagia, she is nothing by mouth and hence started on heparin infusion for therapeutic anticoagulation while off of her eliquis. Patient unable to provide any history. Information obtained from her daughter who is her caregiver. Patient was not administered IV t-PA. She was admitted for further evaluation and treatment.   SUBJECTIVE (INTERVAL HISTORY) Her daughter and son in law are at the bedside.  Overall her condition is  stable. No acute event overnight. Pt had EEG which is normal. Passed swallow.   OBJECTIVE Temp:  [97.7 F (36.5 C)-98.2 F (36.8 C)] 98.2 F (36.8 C) (05/22 0635) Pulse Rate:  [66-94] 66 (05/22 0635) Cardiac Rhythm:  [-] Other (Comment) (05/22 0730) Resp:  [16-20] 20 (05/22 0635) BP: (141-196)/(75-104) 141/88 mmHg (05/22 0635) SpO2:  [96 %-98 %] 97 % (05/22 0635)  CBC:  Recent Labs Lab 06/18/15 1342  06/19/15 0519 06/20/15 0417  WBC 6.8  --  6.7 7.1  NEUTROABS 4.3  --  4.4  --   HGB 12.6  < > 12.3 12.9  HCT 38.0  < > 37.2 39.0  MCV 91.1  --  91.4 92.9  PLT 233  --  232 213  < > = values in this interval not displayed.  Basic Metabolic Panel:   Recent Labs Lab 06/19/15 0519 06/20/15 0417  NA 138 139  K 3.7 3.4*  CL 108 113*  CO2 16* 16*  GLUCOSE 87 115*  BUN 16 9  CREATININE 1.36* 0.94  CALCIUM 8.6* 8.7*    Lipid  Panel:     Component Value Date/Time   CHOL 109 06/20/2015 0417   TRIG 84 06/20/2015 0417   TRIG 134 12/18/2005 1533   HDL 33* 06/20/2015 0417   CHOLHDL 3.3 06/20/2015 0417   CHOLHDL 4.4 CALC 12/18/2005 1533   VLDL 17 06/20/2015 0417   LDLCALC 59 06/20/2015 0417   HgbA1c:  Lab Results  Component Value Date   HGBA1C 5.4 06/19/2015   Urine Drug Screen:     Component Value Date/Time   LABOPIA NONE DETECTED 06/19/2015 1027   LABOPIA PPS 08/19/2014 1354   COCAINSCRNUR NONE DETECTED 06/19/2015 1027   COCAINSCRNUR NEG 08/19/2014 1354   LABBENZ NONE DETECTED 06/19/2015 1027   LABBENZ NEG 08/19/2014 1354   AMPHETMU NONE DETECTED 06/19/2015 1027   AMPHETMU NEG 08/19/2014 1354   THCU NONE DETECTED 06/19/2015 1027   THCU NEG 08/19/2014 1354   LABBARB NONE DETECTED 06/19/2015 1027   LABBARB NEG 08/19/2014 1354      IMAGING I have personally reviewed the radiological images below and agree with the radiology interpretations.  Dg Chest 2 View 06/18/2015  Suboptimal inspiration.  No acute cardiopulmonary disease.   Ct Head Wo Contrast 06/18/2015   No CT evidence of acute intracranial abnormality. Changes of chronic microvascular ischemic disease, as well as encephalomalacia of prior left occipital infarction.   Mr Brain Wo Contrast 06/18/2015  1. Slight extension with acute on chronic infarct of the left occipital pole. 2. No other acute infarct. 3. Expected evolution of left occipital lobe infarct otherwise. 4. Stable advanced atrophy and diffuse white matter disease reflecting the sequela of chronic microvascular ischemia.   EEG - This is a normal EEG for the patients stated age. There were no focal, hemispheric or lateralizing features. No epileptiform activity was recorded. A normal EEG does not exclude the diagnosis of a seizure disorder and if seizure remains high on the list of differential diagnosis, an ambulatory EEG may be of value. Clinical correlation is required.  TTE  03/2015 - Left ventricle: The cavity size was normal. There was mild focal  basal hypertrophy of the septum. Systolic function was normal.  The estimated ejection fraction was in the range of 55% to 60%.  Wall motion was normal; there were no regional wall motion  abnormalities. - Mitral valve: There was mild regurgitation. - Pulmonic valve: There was trivial regurgitation.   PHYSICAL EXAM Physical exam  Temp:  [97 F (36.1 C)-98.2 F (36.8 C)] 97 F (36.1 C) (05/22 1901) Pulse Rate:  [66-109] 87 (05/22 1901) Resp:  [16-20] 20 (05/22 1901) BP: (133-196)/(82-104) 135/89 mmHg (05/22 1901) SpO2:  [94 %-98 %] 94 % (05/22 1901)  General - Well nourished, well developed, sleepy but arousable.  Ophthalmologic - Fundi not visualized due to noncooperation.  Cardiovascular - Regular rate and rhythm.  Mental Status -  Sleepy drowsy but arousable and remains awake for the time of examination. She knows her DOB, but not able to tell the age, orientated to place and people but not to time. Dysarthria at baseline, able to name and repeat. Paucity of speech.   Cranial Nerves II - XII - II - Visual field intact OU, no hemianopia on exam. III, IV, VI - eye disconjugate at rest (baseline), however able to move to lateral side completely. V - Facial sensation intact bilaterally. VII - right facial droop (baseline). VIII - Hearing & vestibular intact bilaterally. X - Palate elevates symmetrically. XI - Chin turning & shoulder shrug intact bilaterally. XII - Tongue protrusion intact.  Motor Strength - The patient's strength was 4/5 BUEs and 3/5 BLEs and pronator drift was absent.  Bulk was normal and fasciculations were absent.   Motor Tone - Muscle tone was assessed at the neck and appendages and was normal.  Reflexes - The patient's reflexes were 1+ in all extremities and she had no pathological reflexes.  Sensory - Light touch, temperature/pinprick were assessed and were symmetrical.     Coordination - The patient had normal movements in the hands with no ataxia or dysmetria. However, RUE asterixis present.  Gait and Station - not tested due to safety concerns.   ASSESSMENT/PLAN Rose Sullivan is a 82 y.o. female with history of CVA with residual right sided weakness, atrial fibrillation, and glucose intolerance presenting with altered mental status and generalized weakness. MRI showed extension of L occipital pole infarct. She did not receive IV t-PA.   Stroke:  Acute left occpital pole infarct adjacent to previous infarct, likely associated with hypotension, dehydration, UTI in the setting of chronic left P1 occlusion. Not the pattern of afib cardioembolic infarct.  Resultant lethargy  MRI  Slight extension with acute on chronic L occipital pole infarct. Old L occipital lobe infarct  No further stroke workup indicated (had complete work up in 03/2015)  LDL 59  HgbA1c 5.4  IV heparin for VTE prophylaxis Diet  NPO time specified. Coughing at home per daughter. ST has seen. For MBSS.   Eliquis (apixaban) daily prior to admission, now on heparin IV. Change back to eliquis once passed swallow  Ongoing aggressive stroke risk factor management  Therapy recommendations:  pending  Disposition:  Pending  Follow up with Dr. Tomi Likens at [facility] neurology.  UTI / dehydration  Likely the cause of infarct extension this time  Pt had similar left PCA infarct extension in 03/2015 due to UTI/dehydration  On Abx  UA negative on admission  As per family, she had positive Urine culture and was put on Abx at outpt  Treatment per primary team  Atrial Fibrillation  Home anticoagulation:  Eliquis (apixaban) daily continued in the hospital  now on IV heparin  Change back to eliquis once able to swallow    Hypertension  Elevated, as high as 196/104  Most recent BP improved  Avoid hypotension  Hyperlipidemia  Home meds:  pravachol 40, resumed in  hospital  LDL 59, goal < 70  Continue statin at discharge  Other Stroke Risk Factors  Advanced age  Obesity, Body mass index is 33.24 kg/(m^2).   Hx stroke/TIA  03/2015 - L PCA territory infarcts, extended from previous infarcts, felt due to UTI with sepsis and dehydration causing hypotension other than embolic from known atrial fibrillation   03/2015 - L thalamic and L mesial temporal lobe infarct embolic secondary to known atrial fibrillation placed on eliquis   Family hx stroke (mother)  Other Active Problems  Legally blind  Acute on chronic diastolic HF  Chronic back pain  Insomnia  Hx metastatic breast cancer, in remission  Recent UTI, neg now, off Bactrim  CKD stage III  Hospital day # 1  Neurology will sign off. Please call with questions. Pt will follow up with Dr. Tomi Likens at [facility] neurology on 08/05/15. Thanks for the consult.  Rosalin Hawking, MD PhD Stroke Neurology 06/20/2015 7:44 PM     To contact Stroke Continuity provider, please refer to [email or web address] After hours, contact General Neurology

## 2015-06-20 NOTE — Progress Notes (Signed)
ANTICOAGULATION CONSULT NOTE - Follow Up Consult  Pharmacy Consult for heparin Indication: atrial fibrillation and stroke  Labs:  Recent Labs  06/18/15 1342 06/18/15 1350 06/18/15 2110 06/19/15 0519 06/19/15 1548 06/20/15 0417  HGB 12.6 13.9  --  12.3  --  12.9  HCT 38.0 41.0  --  37.2  --  39.0  PLT 233  --   --  232  --  213  APTT 28  --   --  59* 98* 99*  LABPROT 21.8*  --   --   --   --   --   INR 1.91*  --   --   --   --   --   HEPARINUNFRC  --   --  >2.20* >2.20*  --   --   CREATININE 1.51* 1.40*  --  1.36*  --   --      Assessment: 82yo female remains above goal on heparin despite rate decrease; no issues overnight per RN.  Goal of Therapy:  aPTT 66-85 seconds    Plan:  Will decrease heparin gtt by 2 units/kg/hr to 700 units/hr and check PTT in 8hr.  Wynona Neat, PharmD, BCPS  06/20/2015,4:51 AM

## 2015-06-20 NOTE — Progress Notes (Signed)
MBSS complete. Full report located under chart review in imaging section.  Recommend: Dys 1, thin, NO straws, crush meds, sit upright, small sips and full supervision/assist.    Cranford Mon.[employer] [phone]

## 2015-06-20 NOTE — Progress Notes (Signed)
PROGRESS NOTE                                                                                                                                                                                                             Patient Demographics:    Rose Sullivan, is a 82 y.o. female, DOB - [birth year], [id number]  Admit date - 06/18/2015   Admitting Physician Jani Gravel, MD  Outpatient Primary MD for the patient is [name], MD  LOS - 1  Chief Complaint  Patient presents with  . Extremity Weakness       Brief Narrative     Rose Sullivan is a 82 y.o. female, w/ CVA with residual right sided weakness, Pafib, Glucose intolerance, apparently per family had altered mental status. Pt is unable to talk like she normally does. Pt seems more somnolent. Denies headache, cp, palp sob, lower ext edema. . Pt has had recent UTI. Pt has been taking Bactrim DS, which is new to her. Pt was brought to ED by her family, daughter. CT brain negative for any acute process.    Subjective:    Rose Sullivan today In bed, nonverbal, denies any headache which she covered, no chest or abdominal pain. Continues to have right-sided weakness. Now has problems swallowing food or liquids, baseline dysarthria from her previous stroke.   Assessment  & Plan :     1.Encephalopathy due to extension of her left occipital stroke. Apparently this will be her third neuro ischemic event in the last few months. She has had right-sided hemiparesis from previous strokes, MRI now shows extension of her left occipital infarct. She has history of paroxysmal atrial fibrillation and recent full stroke workup. Was on Eliquis and currently on heparin drip As she was nothing by mouth, replace on Eliquis. Neuro consulted. Any further workup per neuro.  2. Dysphagia due to stroke extension. Speech therapy Following her underwent modified barium swallow,  currently on dysphagia 1 diet.  3. Recent UTI. UA unremarkable for now stop Bactrim.  4. Dyslipidemia. On statin once taking orally.  5. Paroxysmal atrial fibrillation. Currently on heparin drip now cleared for oral diet would replace on Eliquis, will place on as needed IV Lopressor, oral diltiazem and she is able to tolerate.  6. CKD stage III. Baseline creatinine close to 1.5. Monitor.  7. Possible new  diagnosis of Parkinson's. Defer to neurology.  Code Status :  DNR  Family Communication  :  Daughter over bedside  Disposition Plan  : We decided but most likely SNF  Consults  :  Neuro  Procedures  :   MRI brain. Extension of left occipital infarct.  DVT Prophylaxis  : Heparin/Eliquis  Lab Results  Component Value Date   PLT 213 06/20/2015    Inpatient Medications  Scheduled Meds: . diltiazem  240 mg Oral Daily  . exemestane  25 mg Oral Daily  . insulin aspart  0-15 Units Subcutaneous TID WC  . potassium chloride  40 mEq Oral Once  . pravastatin  40 mg Oral q1800  . sodium chloride flush  3 mL Intravenous Q12H   Continuous Infusions: . sodium chloride    . heparin 700 Units/hr (06/20/15 0458)   PRN Meds:.[DISCONTINUED] acetaminophen **OR** acetaminophen, metoprolol  Antibiotics  :    Anti-infectives    Start     Dose/Rate Route Frequency Ordered Stop   06/18/15 2200  sulfamethoxazole-trimethoprim (BACTRIM DS,SEPTRA DS) 800-160 MG per tablet 1 tablet  Status:  Discontinued     1 tablet Oral 2 times daily 06/18/15 1823 06/19/15 1048   06/18/15 2100  cefTRIAXone (ROCEPHIN) 1 g in dextrose 5 % 50 mL IVPB     1 g 100 mL/hr over 30 Minutes Intravenous  Once 06/18/15 2020 06/19/15 0127         Objective:   Filed Vitals:   06/19/15 1743 06/19/15 2120 06/20/15 0635 06/20/15 1123  BP: 176/79 196/104 141/88 133/84  Pulse: 79 94 66 86  Temp: 98 F (36.7 C) 97.9 F (36.6 C) 98.2 F (36.8 C) 97.7 F (36.5 C)  TempSrc: Axillary Axillary Axillary Oral  Resp:  18 16 20 20   Weight:      SpO2: 96% 97% 97% 98%    Wt Readings from Last 3 Encounters:  06/18/15 77.202 kg (170 lb 3.2 oz)  06/14/15 74.844 kg (165 lb)  06/10/15 80.06 kg (176 lb 8 oz)     Intake/Output Summary (Last 24 hours) at 06/20/15 1234 Last data filed at 06/19/15 1600  Gross per 24 hour  Intake      0 ml  Output      1 ml  Net     -1 ml     Physical Exam  Awake , Chronic right-sided weakness, also has dysarthria and dysphagia now [place].AT,PERRAL Supple Neck,No JVD, No cervical lymphadenopathy appriciated.  Symmetrical Chest wall movement, Good air movement bilaterally, CTAB RRR,No Gallops,Rubs or new Murmurs, No Parasternal Heave +ve B.Sounds, Abd Soft, No tenderness, No organomegaly appriciated, No rebound - guarding or rigidity. No Cyanosis, Clubbing or edema, No new Rash or bruise      Data Review:    CBC  Recent Labs Lab 06/14/15 1410 06/18/15 1342 06/18/15 1350 06/19/15 0519 06/20/15 0417  WBC 10.0  10.0 6.8  --  6.7 7.1  HGB 13.7  13.7 12.6 13.9 12.3 12.9  HCT 40.4  40.4 38.0 41.0 37.2 39.0  PLT 280.0  280.0 233  --  232 213  MCV 92.1  92.1 91.1  --  91.4 92.9  MCH  --  30.2  --  30.2 30.7  MCHC 33.8  33.8 33.2  --  33.1 33.1  RDW 14.4  14.4 13.6  --  13.9 13.7  LYMPHSABS 1.5 1.3  --  1.1  --   MONOABS 0.8 0.8  --  0.7  --  EOSABS 0.2 0.3  --  0.4  --   BASOSABS 0.1 0.0  --  0.0  --     Chemistries   Recent Labs Lab 06/14/15 1410 06/18/15 1342 06/18/15 1350 06/19/15 0519 06/20/15 0417  NA 141  141 138 140 138 139  K 3.7  3.7 3.7 3.6 3.7 3.4*  CL 108  108 109 108 108 113*  CO2 21  21 19*  --  16* 16*  GLUCOSE 101*  101* 110* 103* 87 115*  BUN 21  21 17 19 16 9   CREATININE 1.72*  1.72* 1.51* 1.40* 1.36* 0.94  CALCIUM 9.3  9.3 8.6*  --  8.6* 8.7*  AST 36 56*  --  54*  --   ALT 27 45  --  46  --   ALKPHOS 57 51  --  49  --   BILITOT 0.9 0.7  --  0.6  --     ------------------------------------------------------------------------------------------------------------------  Recent Labs  06/20/15 0417  CHOL 109  HDL 33*  LDLCALC 59  TRIG 84  CHOLHDL 3.3    Lab Results  Component Value Date   HGBA1C 5.4 06/19/2015   ------------------------------------------------------------------------------------------------------------------  Recent Labs  06/19/15 0519  TSH 2.153   ------------------------------------------------------------------------------------------------------------------  Recent Labs  06/18/15 1840  VITAMINB12 195    Coagulation profile  Recent Labs Lab 06/18/15 1342  INR 1.91*    No results for input(s): DDIMER in the last 72 hours.  Cardiac Enzymes No results for input(s): CKMB, TROPONINI, MYOGLOBIN in the last 168 hours.  Invalid input(s): CK ------------------------------------------------------------------------------------------------------------------ No results found for: BNP  Micro Results Recent Results (from the past 240 hour(s))  Urine culture     Status: None   Collection Time: 06/10/15  4:31 PM  Result Value Ref Range Status   Culture ESCHERICHIA COLI  Final   Colony Count >=100,000 COLONIES/ML  Final   Organism ID, Bacteria ESCHERICHIA COLI  Final      Susceptibility   Escherichia coli -  (no method available)    AMPICILLIN >=32 Resistant     AMOX/CLAVULANIC 16 Intermediate     AMPICILLIN/SULBACTAM >=32 Resistant     PIP/TAZO <=4 Sensitive     IMIPENEM <=0.25 Sensitive     CEFAZOLIN <=4 Not Reportable     CEFTRIAXONE <=1 Sensitive     CEFTAZIDIME <=1 Sensitive     CEFEPIME <=1 Sensitive     GENTAMICIN <=1 Sensitive     TOBRAMYCIN <=1 Sensitive     CIPROFLOXACIN >=4 Resistant     LEVOFLOXACIN >=8 Resistant     NITROFURANTOIN <=16 Sensitive     TRIMETH/SULFA* <=20 Sensitive      * NR=NOT REPORTABLE,SEE COMMENTORAL therapy:A cefazolin MIC of <32 predicts susceptibility to the  oral agents cefaclor,cefdinir,cefpodoxime,cefprozil,cefuroxime,cephalexin,and loracarbef when used for therapy of uncomplicated UTIs due to E.coli,K.pneumomiae,and P.mirabilis. PARENTERAL therapy: A cefazolinMIC of >8 indicates resistance to parenteralcefazolin. An alternate test method must beperformed to confirm susceptibility to parenteralcefazolin.  Urine culture     Status: None   Collection Time: 06/19/15 10:27 AM  Result Value Ref Range Status   Specimen Description URINE, CATHETERIZED  Final   Special Requests NONE  Final   Culture NO GROWTH  Final   Report Status 06/20/2015 FINAL  Final  Culture, blood (routine x 2)     Status: None (Preliminary result)   Collection Time: 06/19/15  3:50 PM  Result Value Ref Range Status   Specimen Description BLOOD LEFT HAND  Final   Special Requests  BOTTLES DRAWN AEROBIC AND ANAEROBIC 10CC   Final   Culture NO GROWTH < 24 HOURS  Final   Report Status PENDING  Incomplete  Culture, blood (routine x 2)     Status: None (Preliminary result)   Collection Time: 06/19/15  4:15 PM  Result Value Ref Range Status   Specimen Description BLOOD LEFT WRIST  Final   Special Requests BOTTLES DRAWN AEROBIC AND ANAEROBIC 10CC   Final   Culture NO GROWTH < 24 HOURS  Final   Report Status PENDING  Incomplete    Radiology Reports Dg Chest 1 View  06/10/2015  CLINICAL DATA:  Per family, pt having sob x 2 weeks. Also recently diagnosed and treated for UTI. Only one view chest done, pt in wheelchair without removable arms and could not stand for images. EXAM: CHEST 1 VIEW COMPARISON:  03/13/2015 FINDINGS: Heart size is within normal limits. Small right pleural effusion is present. There are no focal consolidations. No pulmonary edema. IMPRESSION: Small right pleural effusion. Electronically Signed   By: Nolon Nations M.D.   On: 06/10/2015 18:08   Dg Chest 2 View  06/18/2015  CLINICAL DATA:  82 year old who woke up this morning with right-sided weakness. Personal  history of breast cancer. Current history of hypertension and atrial fibrillation. EXAM: CHEST  2 VIEW COMPARISON:  06/10/2015 and earlier. FINDINGS: AP semi-erect and lateral images were obtained. Suboptimal inspiration accounts for crowded bronchovascular markings, especially in the bases, and accentuates the cardiac silhouette. Taking this into account, cardiac silhouette normal in size. Lungs clear. Bronchovascular markings normal. Pulmonary vascularity normal. No visible pleural effusions. Stable chronic elevation of the right hemidiaphragm. Bone infarcts in the proximal bilateral humeri. Degenerative changes throughout the thoracic spine. IMPRESSION: Suboptimal inspiration.  No acute cardiopulmonary disease. Electronically Signed   By: Evangeline Dakin M.D.   On: 06/18/2015 18:44   Ct Head Wo Contrast  06/18/2015  CLINICAL DATA:  82 year old female with a history of right-sided weakness EXAM: CT HEAD WITHOUT CONTRAST TECHNIQUE: Contiguous axial images were obtained from the base of the skull through the vertex without intravenous contrast. COMPARISON:  MR 04/12/2015, CT head 04/11/2015 FINDINGS: Unremarkable appearance of the calvarium without acute fracture or aggressive lesion. Unremarkable appearance of the scalp soft tissues. Unremarkable appearance of the bilateral orbits. Mastoid air cells are clear. No significant paranasal sinus disease No acute intracranial hemorrhage.  No midline shift or mass effect. Volume loss with expansion of the ventricles, not out of proportion to the degree of sulcal prominence. Encephalomalacia of the left occipital region compatible with remote infarction. Gray-white differentiation maintained. Intracranial atherosclerosis. IMPRESSION: No CT evidence of acute intracranial abnormality. Changes of chronic microvascular ischemic disease, as well as encephalomalacia of prior left occipital infarction. Signed, Dulcy Fanny. Earleen Newport, DO [facility] Radiology Electronically Signed   By: Corrie Mckusick D.O.   On: 06/18/2015 15:10   Mr Brain Wo Contrast  06/18/2015  CLINICAL DATA:  Increased right-sided weakness. Last seen well yesterday. Previous infarcts. EXAM: MRI HEAD WITHOUT CONTRAST TECHNIQUE: Multiplanar, multiecho pulse sequences of the brain and surrounding structures were obtained without intravenous contrast. COMPARISON:  CT head without contrast 06/18/2015. MRI brain 04/12/2015. FINDINGS: Focal restricted diffusion at the occipital pole represents minimal extension of the previous infarct. There is otherwise expected evolution of the previously noted left PCA territory infarct. Advanced atrophy and diffuse white matter disease is otherwise stable. The ventricles are proportionate to the degree of atrophy. No significant extra-axial fluid collection  is present. The internal auditory canals are normal bilaterally. Flow is present in the major intracranial arteries. Bilateral lens replacements are present. The globes and orbits are intact. The paranasal sinuses and the mastoid air cells are clear. The skullbase is within normal limits. Midline sagittal images are unremarkable. IMPRESSION: 1. Slight extension with acute on chronic infarct of the left occipital pole. 2. No other acute infarct. 3. Expected evolution of left occipital lobe infarct otherwise. 4. Stable advanced atrophy and diffuse white matter disease reflecting the sequela of chronic microvascular ischemia. Electronically Signed   By: San Morelle M.D.   On: 06/18/2015 17:47   Dg Swallowing Func-speech Pathology  06/20/2015  Objective Swallowing Evaluation: Type of Study: MBS-Modified Barium Swallow Study Patient Details Name: KHIYA WESTHOFF MRN: [id number] Date of Birth: 02-13-1933 Today's Date: 06/20/2015 Time: SLP Start Time (ACUTE ONLY): 0840-SLP Stop Time (ACUTE ONLY): 0855 SLP Time Calculation (min) (ACUTE ONLY): 15 min Past Medical History: Past  Medical History Diagnosis Date . GLUCOSE INTOLERANCE 02/11/2007 . HYPERLIPIDEMIA 02/11/2007 . BLINDNESS, [place], [place] DEFINITION 11/25/2006 . Other specified forms of hearing loss 09/05/2009 . HYPERTENSION 02/11/2007 . Atrial fibrillation ([place]) 03/07/2009 . GERD 02/11/2007 . LOW BACK PAIN, CHRONIC 11/25/2006 . BACK PAIN 12/10/2007 . Pain in Soft Tissues of Limb 02/11/2007 . OSTEOPENIA 02/11/2007 . FATIGUE 12/10/2007 . GAIT DISTURBANCE 12/10/2007 . DYSPNEA 03/18/2009 . Cough 03/07/2009 . CHEST PAIN 12/10/2007 . Abdominal pain, left lower quadrant 04/01/2007 . BREAST CANCER, HX OF 11/25/2006 . Nocturia 05/18/2010 . Hypersomnia 05/18/2010 . Impaired glucose tolerance 05/18/2010 . Allergic rhinitis, cause unspecified 07/23/2012 . Anxiety state, unspecified 02/04/2013 . Cancer ([place])  . Renal insufficiency 06/18/2015 Past Surgical History: Past Surgical History Procedure Laterality Date . Dilation and curettage of uterus   . S/p mastectomy Right  . Appendectomy   . Cataract extraction   . Knee surgery Right  HPI: 82 y.o. female with CVA with residual right sided weakness, legal blindness, chronic low back pain, HTN, Pafib, Glucose intolerance, apparently per family had altered mental status and unable to talk like  normally.Per chart has had recent UTI.MRI acute on chronic infarct of the left occipital pole. CT brain negative for any acute process.As SLP documenting, neurologist reported new diagnosis of Parkinson's. CXR no acute cardiopulmonary disease. MBS 04/13/15 recommended Dys 2, nectar liquids and daughter reports pt able to return to regular texture and thin liquids. Daughter also reports coughing/strangling with po's several days prior to admit. Subjective: pt alert, believes she is coughing less with the thicker drinks Assessment / Plan / Recommendation CHL IP CLINICAL IMPRESSIONS 06/20/2015 Therapy Diagnosis Mild oral phase dysphagia;Mild pharyngeal phase dysphagia Clinical Impression Pt exhibited mild oral and phryngeal dysphagia  due to min sensory and mild motor/anatomical impairments. Pt's audible swallow due to what appears to be cervical bony structures/osteophytes concomitant with mildly delayed swallow initation resulted in minimal silent laryngeal penetration (flash and remained above vocal cords) increased with use of straw. Reduced lateral mandibular movement coupled with missing dentition results in prolonged mastication and transit of Dys 2 texture. Dysphagia likely to progress as Parkinson's progresses. Recommend Dys 1 diet, thin liquids, NO straws, small cup sips pills crushed, must sit upright and full supervision  Impact on safety and function Moderate aspiration risk   CHL IP TREATMENT RECOMMENDATION 06/20/2015 Treatment Recommendations Therapy as outlined in treatment plan below   Prognosis 06/20/2015 Prognosis for Safe Diet Advancement (No Data) Barriers to Reach Goals Cognitive deficits Barriers/Prognosis Comment -- CHL IP DIET RECOMMENDATION 06/20/2015 SLP Diet Recommendations  Dysphagia 1 (Puree) solids;Thin liquid Liquid Administration via Cup;No straw Medication Administration Crushed with puree Compensations Slow rate;Small sips/bites;Minimize environmental distractions Postural Changes Seated upright at 90 degrees   CHL IP OTHER RECOMMENDATIONS 06/20/2015 Recommended Consults -- Oral Care Recommendations Oral care BID Other Recommendations --   CHL IP FOLLOW UP RECOMMENDATIONS 06/20/2015 Follow up Recommendations (No Data)   CHL IP FREQUENCY AND DURATION 06/20/2015 Speech Therapy Frequency (ACUTE ONLY) min 2x/week Treatment Duration 2 weeks      CHL IP ORAL PHASE 06/20/2015 Oral Phase Impaired Oral - Pudding Teaspoon -- Oral - Pudding Cup -- Oral - Honey Teaspoon -- Oral - Honey Cup -- Oral - Nectar Teaspoon -- Oral - Nectar Cup -- Oral - Nectar Straw -- Oral - Thin Teaspoon -- Oral - Thin Cup -- Oral - Thin Straw -- Oral - Puree -- Oral - Mech Soft Delayed oral transit Oral - Regular -- Oral - Multi-Consistency -- Oral -  Pill -- Oral Phase - Comment --  CHL IP PHARYNGEAL PHASE 06/20/2015 Pharyngeal Phase Impaired Pharyngeal- Pudding Teaspoon -- Pharyngeal -- Pharyngeal- Pudding Cup -- Pharyngeal -- Pharyngeal- Honey Teaspoon -- Pharyngeal -- Pharyngeal- Honey Cup -- Pharyngeal -- Pharyngeal- Nectar Teaspoon Pharyngeal residue - pyriform;Pharyngeal residue - valleculae;Reduced tongue base retraction;Reduced laryngeal elevation;Delayed swallow initiation-vallecula Pharyngeal -- Pharyngeal- Nectar Cup Pharyngeal residue - pyriform;Pharyngeal residue - valleculae;Reduced tongue base retraction;Reduced laryngeal elevation Pharyngeal -- Pharyngeal- Nectar Straw Penetration/Aspiration during swallow;Delayed swallow initiation-vallecula Pharyngeal Material enters airway, remains ABOVE vocal cords and not ejected out Pharyngeal- Thin Teaspoon NT Pharyngeal -- Pharyngeal- Thin Cup Penetration/Aspiration during swallow Pharyngeal Material enters airway, remains ABOVE vocal cords then ejected out Pharyngeal- Thin Straw Delayed swallow initiation-vallecula Pharyngeal -- Pharyngeal- Puree -- Pharyngeal -- Pharyngeal- Mechanical Soft Pharyngeal residue - valleculae;Reduced tongue base retraction Pharyngeal -- Pharyngeal- Regular -- Pharyngeal -- Pharyngeal- Multi-consistency -- Pharyngeal -- Pharyngeal- Pill -- Pharyngeal -- Pharyngeal Comment --  CHL IP CERVICAL ESOPHAGEAL PHASE 06/20/2015 Cervical Esophageal Phase WFL Pudding Teaspoon -- Pudding Cup -- Honey Teaspoon -- Honey Cup -- Nectar Teaspoon -- Nectar Cup -- Nectar Straw -- Thin Teaspoon -- Thin Cup -- Thin Straw -- Puree -- Mechanical Soft -- Regular -- Multi-consistency -- Pill -- Cervical Esophageal Comment -- No flowsheet data found. [place] Siren 06/20/2015, 11:56 AM Orbie Pyo Colvin Caroli.Ed CCC-SLP Pager [phone]               Time Spent in minutes  30   SINGH,PRASHANT K M.D on 06/20/2015 at 12:34 PM  Between 7am to 7pm - Pager - [phone]  After 7pm go to  [email or web address] - password [facility]  [facility] -  Office  [phone]

## 2015-06-20 NOTE — Progress Notes (Signed)
[facility]  Patient Status: Active (receiving services up to time of hospitalization)  [facility] is providing the following services: RN, PT and OT  If patient discharges after hours, please call [phone].   Rose Sullivan 06/20/2015, 11:05 AM

## 2015-06-20 NOTE — Progress Notes (Signed)
EEG Completed; Results Pending  

## 2015-06-21 LAB — BASIC METABOLIC PANEL
ANION GAP: 8 (ref 5–15)
ANION GAP: 7 (ref 5–15)
BUN: 16 mg/dL (ref 6–20)
BUN: 15 mg/dL (ref 6–20)
CALCIUM: 9.1 mg/dL (ref 8.9–10.3)
CHLORIDE: 114 mmol/L — AB (ref 101–111)
CO2: 18 mmol/L — ABNORMAL LOW (ref 22–32)
CO2: 19 mmol/L — AB (ref 22–32)
CREATININE: 1.13 mg/dL — AB (ref 0.44–1.00)
Calcium: 9.1 mg/dL (ref 8.9–10.3)
Chloride: 113 mmol/L — ABNORMAL HIGH (ref 101–111)
Creatinine, Ser: 1.12 mg/dL — ABNORMAL HIGH (ref 0.44–1.00)
GFR calc Af Amer: 52 mL/min — ABNORMAL LOW (ref >60)
GFR, EST AFRICAN AMERICAN: 52 mL/min — AB (ref >60)
GFR, EST NON AFRICAN AMERICAN: 45 mL/min — AB (ref >60)
GFR, EST NON AFRICAN AMERICAN: 45 mL/min — AB (ref >60)
GLUCOSE: 130 mg/dL — AB (ref 65–99)
Glucose, Bld: 95 mg/dL (ref 65–99)
POTASSIUM: 3.7 mmol/L (ref 3.5–5.1)
Potassium: 3.9 mmol/L (ref 3.5–5.1)
Sodium: 139 mmol/L (ref 135–145)
Sodium: 140 mmol/L (ref 135–145)

## 2015-06-21 LAB — MAGNESIUM: MAGNESIUM: 1.9 mg/dL (ref 1.7–2.4)

## 2015-06-21 LAB — GLUCOSE, CAPILLARY
Glucose-Capillary: 108 mg/dL — ABNORMAL HIGH (ref 65–99)
Glucose-Capillary: 100 mg/dL — ABNORMAL HIGH (ref 65–99)
Glucose-Capillary: 91 mg/dL (ref 65–99)

## 2015-06-21 MED ORDER — MAGNESIUM SULFATE IN D5W 1-5 GM/100ML-% IV SOLN
1.0000 g | INTRAVENOUS | Status: AC
  Administered 2015-06-21: 1 g via INTRAVENOUS
  Filled 2015-06-21: qty 100

## 2015-06-21 MED ORDER — AMIODARONE LOAD VIA INFUSION
150.0000 mg | Freq: Once | INTRAVENOUS | Status: AC
  Administered 2015-06-21: 150 mg via INTRAVENOUS
  Filled 2015-06-21: qty 83.34

## 2015-06-21 MED ORDER — AMIODARONE HCL IN DEXTROSE 360-4.14 MG/200ML-% IV SOLN
30.0000 mg/h | INTRAVENOUS | Status: DC
  Filled 2015-06-21 (×2): qty 200

## 2015-06-21 MED ORDER — HYDRALAZINE HCL 20 MG/ML IJ SOLN
10.0000 mg | Freq: Once | INTRAMUSCULAR | Status: AC
  Administered 2015-06-21: 10 mg via INTRAVENOUS
  Filled 2015-06-21: qty 1

## 2015-06-21 MED ORDER — AMIODARONE HCL IN DEXTROSE 360-4.14 MG/200ML-% IV SOLN
30.0000 mg/h | INTRAVENOUS | Status: DC
Start: 2015-06-22 — End: 2015-06-22
  Administered 2015-06-22 (×2): 30 mg/h via INTRAVENOUS
  Filled 2015-06-21: qty 200

## 2015-06-21 MED ORDER — AMIODARONE HCL IN DEXTROSE 360-4.14 MG/200ML-% IV SOLN
60.0000 mg/h | INTRAVENOUS | Status: DC
  Administered 2015-06-21 – 2015-06-22 (×2): 60 mg/h via INTRAVENOUS
  Filled 2015-06-21: qty 200

## 2015-06-21 NOTE — Progress Notes (Signed)
Per unit secretary patient still does not have new bed assignment. Patient heart rate elevated 130-150's atrial fibrillation.Will continue to monitor.

## 2015-06-21 NOTE — Progress Notes (Signed)
Upon rounding, found patient trying to get out of bed and had managed to move both legs off the side of the bed and twist her upper body sideways in the bed.  Replaced her better in the bed and changed alarm setting to sensitive; family spending night on couch unable to place mat.

## 2015-06-21 NOTE — Progress Notes (Signed)
Speech Language Pathology Treatment: Dysphagia  Patient Details Name: Rose Sullivan MRN: [id number] DOB: 12-16-1933 Today's Date: 06/21/2015 Time: [id number] SLP Time Calculation (min) (ACUTE ONLY): 18 min  Assessment / Plan / Recommendation Clinical Impression  Dysphagia treatment with lunch. Hand over hand assist to hold utensil, scoop food and required cues to bring spoon to mouth and initiate all movements. Encouraged daughter to allow pt to self feed at beginning of meal and can provide total feeding assist if needed due to fatigue for adequate nutrition. Right sided labial residue; no cough, throat clear or wet vocal quality observed today. Daughter educated re: compensatory strategies and answered questions.    HPI HPI: 82 y.o. female with CVA with residual right sided weakness, legal blindness, chronic low back pain, HTN, Pafib, Glucose intolerance, apparently per family had altered mental status and unable to talk like  normally.Per chart has had recent UTI.MRI acute on chronic infarct of the left occipital pole. CT brain negative for any acute process.As SLP documenting, neurologist reported new diagnosis of Parkinson's. CXR no acute cardiopulmonary disease. MBS 04/13/15 recommended Dys 2, nectar liquids and daughter reports pt able to return to regular texture and thin liquids. Daughter also reports coughing/strangling with po's several days prior to admit.      SLP Plan  Continue with current plan of care     Recommendations  Diet recommendations: Dysphagia 1 (puree);Thin liquid Liquids provided via: Cup;No straw Medication Administration: Crushed with puree Supervision: Staff to assist with self feeding;Patient able to self feed;Full supervision/cueing for compensatory strategies Compensations: Slow rate;Small sips/bites;Minimize environmental distractions Postural Changes and/or Swallow Maneuvers: Seated upright 90 degrees             Oral Care Recommendations: Oral  care BID Follow up Recommendations: Skilled Nursing facility Plan: Continue with current plan of care     GO                [place] Siren 06/21/2015, 1:50 PM  Orbie Pyo Colvin Caroli.Ed [employer] [phone]

## 2015-06-21 NOTE — Progress Notes (Signed)
Bed placement called regarding Patient at 2000, bed obtained on 2 Central. Unable to come to bedside right away due to emergency. Call received per floor RN at 2050 regarding Amiodarone IV med start. Advised RN to obtain new set of VS and call report to 2 Writer. Pt seen at bedside at 2110 HR 150-160 Afib, BP 159/81 found resting in bed lethargic but responsive to voice and able to assist with turning in bed. Amiodarone IV bolus 150 mg started  with gtt running behind at 60 mg/hr. HR 145 BP 160/89.  Pharmacy notified of delayed start and will reschedule reduction dose time. Pt transferred to 2C08 and care assumed per SDU Nurse Amy RN.

## 2015-06-21 NOTE — Progress Notes (Signed)
Called rapid response nurse  to remind them to start IV amiodarone.Checked with unit secretary prior to calling and was told patient still didn't have bed assignment.While speaking with rapid response nurse this nurse was told patient had bed assignment on 2 central and IV amiodarone could be started their just call report.

## 2015-06-21 NOTE — Evaluation (Signed)
Physical Therapy Evaluation Patient Details Name: Rose Sullivan MRN: [id number] DOB: 08-25-1933 Today's Date: 06/21/2015   History of Present Illness  Patient is a 82 y/o female with hx of CVA, DM, HTN, A-fib, breast ca and anxiety presents with AMS. MRI showed extension of left occipital infarct.   Clinical Impression  Patient presents with difficulty speaking, weakness, incoordination BUEs, (RUE>LUE), impaired balance and elevated HR during mobility s/p above. PTA, pt requires assist with ADLs and transfers/ambulation. Lengthy discussion with daughter about disposition. She would really like to take pt home if she is able. Tolerated gait training with Mod A for balance/safety. Able to transfer with Mod A as well. Pt with minimal verbalizations but able to follow 1-2 step commands. Will perform family training tomorrow in AM to determine if home is a safe discharge plan with continued HHPT. If not, pt will need ST SNF.    Follow Up Recommendations SNF;Supervision for mobility/OOB (HHPT pending improvement and family training tomorrow)    Equipment Recommendations  None recommended by PT    Recommendations for Other Services OT consult     Precautions / Restrictions Precautions Precautions: Fall Precaution Comments: legally blind Restrictions Weight Bearing Restrictions: No      Mobility  Bed Mobility Overal bed mobility: Needs Assistance Bed Mobility: Rolling;Sidelying to Sit Rolling: Min assist Sidelying to sit: Mod assist;HOB elevated       General bed mobility comments: Assist with BLEs, scooting bottom and to elevate trunk. Use of rail. increased time and cues.  Transfers Overall transfer level: Needs assistance Equipment used: Rolling walker (2 wheeled) Transfers: Sit to/from [facility] Sit to Stand: Mod assist;Min assist Stand pivot transfers: Mod assist       General transfer comment: Mod A on first stand from EOB progressing to Min A with  manual cues for hand placement. Increased hip flexion. SPT bed to chair with Mod A with posterior bias.  Ambulation/Gait Ambulation/Gait assistance: Mod assist Ambulation Distance (Feet): 10 Feet Assistive device: Rolling walker (2 wheeled) Gait Pattern/deviations: Step-to pattern;Shuffle;Trunk flexed Gait velocity: decreased   General Gait Details: Slow shuffling gait with assist for RW management, directional cues and balance. Unsteady. HR up to 143 bpm.  Stairs            Wheelchair Mobility    Modified Rankin (Stroke Patients Only) Modified Rankin (Stroke Patients Only) Pre-Morbid Rankin Score: Moderately severe disability Modified Rankin: Moderately severe disability     Balance Overall balance assessment: Needs assistance Sitting-balance support: Feet supported;Bilateral upper extremity supported Sitting balance-Leahy Scale: Fair Sitting balance - Comments: Able to sit unsupported with BUE support. Postural control: Posterior lean Standing balance support: During functional activity Standing balance-Leahy Scale: Poor Standing balance comment: Reliant on BUEs for support and Min A.                              Pertinent Vitals/Pain Pain Assessment: No/denies pain Faces Pain Scale: No hurt    Home Living Family/patient expects to be discharged to:: Private residence Living Arrangements: Children Available Help at Discharge: Family;Available 24 hours/day Type of Home: House Home Access: Ramped entrance     Home Layout: One level Home Equipment: Wheelchair - [employer] - 2 wheels      Prior Function Level of Independence: Needs assistance   Gait / Transfers Assistance Needed: Does short distance ambulation with Min A and Rw. Getting HHPT, OT  ADL's / Homemaking Assistance Needed: Assist with  dressing/bathing from daughter        Hand Dominance   Dominant Hand: Right    Extremity/Trunk Assessment   Upper Extremity Assessment: Defer  to OT evaluation (intention tremor noted RUE. Incoordination BUEs as noted during finger to nose testing.)           Lower Extremity Assessment: Generalized weakness         Communication   Communication: Expressive difficulties  Cognition Arousal/Alertness: Awake/alert Behavior During Therapy: Flat affect Overall Cognitive Status: Difficult to assess                      General Comments General comments (skin integrity, edema, etc.): daughter present during session and provided PLOF/history.    Exercises        Assessment/Plan    PT Assessment Patient needs continued PT services  PT Diagnosis Difficulty walking;Abnormality of gait;Generalized weakness   PT Problem List Decreased strength;Decreased coordination;Cardiopulmonary status limiting activity;Decreased cognition;Decreased activity tolerance;Decreased balance;Decreased mobility;Decreased knowledge of use of DME  PT Treatment Interventions Balance training;Gait training;Functional mobility training;Therapeutic exercise;Therapeutic activities;Wheelchair mobility training;Patient/family education;Neuromuscular re-education;DME instruction   PT Goals (Current goals can be found in the Care Plan section) Acute Rehab PT Goals Patient Stated Goal: none stated but per daughter to go home PT Goal Formulation: With patient/family Time For Goal Achievement: 07/05/15 Potential to Achieve Goals: Good    Frequency Min 3X/week   Barriers to discharge        Co-evaluation               End of Session Equipment Utilized During Treatment: Gait belt Activity Tolerance: Patient tolerated treatment well Patient left: in chair;with call bell/phone within reach;with family/visitor present Nurse Communication: Mobility status         Time: [id number] PT Time Calculation (min) (ACUTE ONLY): 41 min   Charges:   PT Evaluation $PT Eval Moderate Complexity: 1 Procedure PT Treatments $Gait Training: 8-22  mins $Therapeutic Activity: 8-22 mins   PT G Codes:        Arionna Hoggard A Ahnna Dungan 06/21/2015, 2:11 PM Wray Kearns, [place], DPT [phone]

## 2015-06-21 NOTE — Clinical Social Work Note (Signed)
Clinical Social Work Assessment  Patient Details  Name: NAA CLAPSADDLE MRN: [id number] Date of Birth: September 01, 1933  Date of referral:  06/21/15               Reason for consult:  Facility Placement                Permission sought to share information with:  Family Supports Permission granted to share information::  Yes, Verbal Permission Granted  Name::     Diplomatic Services operational officer::  SNF  Relationship::  dtr  Contact Information:     Housing/Transportation Living arrangements for the past 2 months:  [place] of Information:  Adult Children Patient Interpreter Needed:  None Criminal Activity/Legal Involvement Pertinent to Current Situation/Hospitalization:  No - Comment as needed Significant Relationships:  Adult Children Lives with:  Adult Children Do you feel safe going back to the place where you live?  Yes Need for family participation in patient care:  Yes (Comment) (decision making)  Care giving concerns:  Pt lives with dtr who would like to take pt home if possible/if pt is able to get up with minimum assist   Facilities manager / plan: CSW spoke with dtr about PT recommendation for SNF- dtr is interested in [place] or [place] if necessary but would prefer to take pt home.  Employment status:  Retired Forensic scientist:    PT Recommendations:  [place] / Referral to community resources:  [place]  Patient/Family's Response to care:  Agreeable to SNF if necessary  Patient/Family's Understanding of and Emotional Response to Diagnosis, Current Treatment, and Prognosis:no questions or concerns- family is very involved in care and hopeful pt can return home when ready.  Emotional Assessment Appearance:  Appears stated age Attitude/Demeanor/Rapport:  Unable to Assess Affect (typically observed):  Unable to Assess Orientation:  Oriented to Self, Oriented to Place Alcohol / Substance use:  Not  Applicable Psych involvement (Current and /or in the community):  No (Comment)  Discharge Needs  Concerns to be addressed:  Care Coordination Readmission within the last 30 days:  No Current discharge risk:  Physical Impairment Barriers to Discharge:  Continued Medical Work up   Cranford Mon, LCSW 06/21/2015, 5:26 PM

## 2015-06-21 NOTE — Progress Notes (Addendum)
PROGRESS NOTE                                                                                                                                                                                                             Patient Demographics:    Rose Sullivan, is a 82 y.o. female, DOB - 1933-11-01, [id number]  Admit date - 06/18/2015   Admitting Physician Jani Gravel, MD  Outpatient Primary MD for the patient is [name], MD  LOS - 2  Chief Complaint  Patient presents with  . Extremity Weakness       Brief Narrative     Rose Sullivan is a 82 y.o. female, w/ CVA with residual right sided weakness, Pafib, Glucose intolerance, apparently per family had altered mental status. Pt is unable to talk like she normally does. Pt seems more somnolent. Denies headache, cp, palp sob, lower ext edema. . Pt has had recent UTI. Pt has been taking Bactrim DS, which is new to her. Pt was brought to ED by her family, daughter. CT brain negative for any acute process. MRI showed CVA extension likely due to dehydration.    Subjective:    Rose Sullivan today In bed, nonverbal, denies any headache which she covered, no chest or abdominal pain. Continues to have right-sided weakness. Now has problems swallowing food or liquids, baseline dysarthria from her previous stroke.   Assessment  & Plan :     1.Encephalopathy due to extension of her left occipital stroke & dehydration. Apparently this will be her third neuro ischemic event in the last few months. She has had right-sided hemiparesis from previous strokes, MRI now shows extension of her left occipital infarct. She has history of paroxysmal atrial fibrillation and recent full stroke workup. Seen by Neuro, continue Eliquis, avoid dehydration, no UTI this admission.  2. Dysphagia due to stroke extension. Speech therapy Following her underwent modified barium swallow,  currently on dysphagia 1 diet. Continue speech and PT follow up, will need SNF, S work called.  3. Recent UTI. UA unremarkable this admission, stopped Bactrim.  4. Dyslipidemia. On statin once taking orally.  5. Paroxysmal atrial fibrillation. NSR, Currently on Eliquis, will place on as needed IV Lopressor, on oral diltiazem.  Addendum. At 6 PM patient went into A. fib with RVR, discussed with Dr. Quay Burow cardiologist on-call. Appropriate  to start the patient on amiodarone. We'll start on IV amiodarone transition to oral amiodarone in 24 hours, using amiodarone as we are trying to avoid hypotension as patient has had 3 extensions of a stroke related to hypotension.   6. CKD stage III. Baseline creatinine close to 1.5. Monitor.  7. Possible new diagnosis of Parkinson's.  Ruled out by Dr Erlinda Hong.  8 . Dehydration - resolved after IVF.   Code Status :  DNR  Family Communication  :  Daughter over bedside  Disposition Plan  : family requests SNF  Consults  :  Neuro  Procedures  :   MRI brain. Extension of left occipital infarct.  DVT Prophylaxis  : Heparin/Eliquis  Lab Results  Component Value Date   PLT 213 06/20/2015    Inpatient Medications  Scheduled Meds: . apixaban  5 mg Oral BID  . diltiazem  240 mg Oral Daily  . exemestane  25 mg Oral Daily  . insulin aspart  0-15 Units Subcutaneous TID WC  . pravastatin  40 mg Oral q1800  . sodium chloride flush  3 mL Intravenous Q12H   Continuous Infusions:   PRN Meds:.[DISCONTINUED] acetaminophen **OR** acetaminophen, metoprolol  Antibiotics  :    Anti-infectives    Start     Dose/Rate Route Frequency Ordered Stop   06/18/15 2200  sulfamethoxazole-trimethoprim (BACTRIM DS,SEPTRA DS) 800-160 MG per tablet 1 tablet  Status:  Discontinued     1 tablet Oral 2 times daily 06/18/15 1823 06/19/15 1048   06/18/15 2100  cefTRIAXone (ROCEPHIN) 1 g in dextrose 5 % 50 mL IVPB     1 g 100 mL/hr over 30 Minutes Intravenous  Once  06/18/15 2020 06/19/15 0127         Objective:   Filed Vitals:   06/20/15 2237 06/21/15 0213 06/21/15 0500 06/21/15 0519  BP: 142/101 154/76  132/65  Pulse: 80 83  85  Temp: 98.6 F (37 C) 98.5 F (36.9 C)  98.2 F (36.8 C)  TempSrc: Oral Axillary  Axillary  Resp: 18 16  16   Weight:   79.606 kg (175 lb 8 oz)   SpO2: 98% 98%  97%    Wt Readings from Last 3 Encounters:  06/21/15 79.606 kg (175 lb 8 oz)  06/14/15 74.844 kg (165 lb)  06/10/15 80.06 kg (176 lb 8 oz)     Intake/Output Summary (Last 24 hours) at 06/21/15 0955 Last data filed at 06/20/15 1200  Gross per 24 hour  Intake    480 ml  Output      0 ml  Net    480 ml     Physical Exam  Awake , Chronic right-sided weakness & facial droop, also has dysarthria and dysphagia now [place].AT,PERRAL Supple Neck,No JVD, No cervical lymphadenopathy appriciated.  Symmetrical Chest wall movement, Good air movement bilaterally, CTAB RRR,No Gallops,Rubs or new Murmurs, No Parasternal Heave +ve B.Sounds, Abd Soft, No tenderness, No organomegaly appriciated, No rebound - guarding or rigidity. No Cyanosis, Clubbing or edema, No new Rash or bruise      Data Review:    CBC  Recent Labs Lab 06/14/15 1410 06/18/15 1342 06/18/15 1350 06/18/15 1840 06/19/15 0519 06/20/15 0417  WBC 10.0  10.0 6.8  --   --  6.7 7.1  HGB 13.7  13.7 12.6 13.9  --  12.3 12.9  HCT 40.4  40.4 38.0 41.0 39.8 37.2 39.0  PLT 280.0  280.0 233  --   --  232 213  MCV  92.1  92.1 91.1  --   --  91.4 92.9  MCH  --  30.2  --   --  30.2 30.7  MCHC 33.8  33.8 33.2  --   --  33.1 33.1  RDW 14.4  14.4 13.6  --   --  13.9 13.7  LYMPHSABS 1.5 1.3  --   --  1.1  --   MONOABS 0.8 0.8  --   --  0.7  --   EOSABS 0.2 0.3  --   --  0.4  --   BASOSABS 0.1 0.0  --   --  0.0  --     Chemistries   Recent Labs Lab 06/14/15 1410 06/18/15 1342 06/18/15 1350 06/19/15 0519 06/20/15 0417 06/21/15 0516  NA 141  141 138 140 138 139 139  K 3.7  3.7 3.7  3.6 3.7 3.4* 3.9  CL 108  108 109 108 108 113* 113*  CO2 21  21 19*  --  16* 16* 18*  GLUCOSE 101*  101* 110* 103* 87 115* 95  BUN 21  21 17 19 16 9 16   CREATININE 1.72*  1.72* 1.51* 1.40* 1.36* 0.94 1.13*  CALCIUM 9.3  9.3 8.6*  --  8.6* 8.7* 9.1  AST 36 56*  --  54*  --   --   ALT 27 45  --  46  --   --   ALKPHOS 57 51  --  49  --   --   BILITOT 0.9 0.7  --  0.6  --   --    ------------------------------------------------------------------------------------------------------------------  Recent Labs  06/20/15 0417  CHOL 109  HDL 33*  LDLCALC 59  TRIG 84  CHOLHDL 3.3    Lab Results  Component Value Date   HGBA1C 5.4 06/19/2015   ------------------------------------------------------------------------------------------------------------------  Recent Labs  06/19/15 0519  TSH 2.153   ------------------------------------------------------------------------------------------------------------------  Recent Labs  06/18/15 1840  VITAMINB12 195    Coagulation profile  Recent Labs Lab 06/18/15 1342  INR 1.91*    No results for input(s): DDIMER in the last 72 hours.  Cardiac Enzymes No results for input(s): CKMB, TROPONINI, MYOGLOBIN in the last 168 hours.  Invalid input(s): CK ------------------------------------------------------------------------------------------------------------------ No results found for: BNP  Micro Results Recent Results (from the past 240 hour(s))  Urine culture     Status: None   Collection Time: 06/19/15 10:27 AM  Result Value Ref Range Status   Specimen Description URINE, CATHETERIZED  Final   Special Requests NONE  Final   Culture NO GROWTH  Final   Report Status 06/20/2015 FINAL  Final  Culture, blood (routine x 2)     Status: None (Preliminary result)   Collection Time: 06/19/15  3:50 PM  Result Value Ref Range Status   Specimen Description BLOOD LEFT HAND  Final   Special Requests BOTTLES DRAWN AEROBIC AND  ANAEROBIC 10CC   Final   Culture NO GROWTH < 24 HOURS  Final   Report Status PENDING  Incomplete  Culture, blood (routine x 2)     Status: None (Preliminary result)   Collection Time: 06/19/15  4:15 PM  Result Value Ref Range Status   Specimen Description BLOOD LEFT WRIST  Final   Special Requests BOTTLES DRAWN AEROBIC AND ANAEROBIC 10CC   Final   Culture NO GROWTH < 24 HOURS  Final   Report Status PENDING  Incomplete    Radiology Reports Dg Chest 1 View  06/10/2015  CLINICAL DATA:  Per family,  pt having sob x 2 weeks. Also recently diagnosed and treated for UTI. Only one view chest done, pt in wheelchair without removable arms and could not stand for images. EXAM: CHEST 1 VIEW COMPARISON:  03/13/2015 FINDINGS: Heart size is within normal limits. Small right pleural effusion is present. There are no focal consolidations. No pulmonary edema. IMPRESSION: Small right pleural effusion. Electronically Signed   By: Nolon Nations M.D.   On: 06/10/2015 18:08   Dg Chest 2 View  06/18/2015  CLINICAL DATA:  82 year old who woke up this morning with right-sided weakness. Personal history of breast cancer. Current history of hypertension and atrial fibrillation. EXAM: CHEST  2 VIEW COMPARISON:  06/10/2015 and earlier. FINDINGS: AP semi-erect and lateral images were obtained. Suboptimal inspiration accounts for crowded bronchovascular markings, especially in the bases, and accentuates the cardiac silhouette. Taking this into account, cardiac silhouette normal in size. Lungs clear. Bronchovascular markings normal. Pulmonary vascularity normal. No visible pleural effusions. Stable chronic elevation of the right hemidiaphragm. Bone infarcts in the proximal bilateral humeri. Degenerative changes throughout the thoracic spine. IMPRESSION: Suboptimal inspiration.  No acute cardiopulmonary disease. Electronically Signed   By: Evangeline Dakin M.D.   On: 06/18/2015 18:44   Ct Head Wo Contrast  06/18/2015  CLINICAL  DATA:  82 year old female with a history of right-sided weakness EXAM: CT HEAD WITHOUT CONTRAST TECHNIQUE: Contiguous axial images were obtained from the base of the skull through the vertex without intravenous contrast. COMPARISON:  MR 04/12/2015, CT head 04/11/2015 FINDINGS: Unremarkable appearance of the calvarium without acute fracture or aggressive lesion. Unremarkable appearance of the scalp soft tissues. Unremarkable appearance of the bilateral orbits. Mastoid air cells are clear. No significant paranasal sinus disease No acute intracranial hemorrhage.  No midline shift or mass effect. Volume loss with expansion of the ventricles, not out of proportion to the degree of sulcal prominence. Encephalomalacia of the left occipital region compatible with remote infarction. Gray-white differentiation maintained. Intracranial atherosclerosis. IMPRESSION: No CT evidence of acute intracranial abnormality. Changes of chronic microvascular ischemic disease, as well as encephalomalacia of prior left occipital infarction. Signed, Dulcy Fanny. Earleen Newport, DO [facility] Electronically Signed   By: Corrie Mckusick D.O.   On: 06/18/2015 15:10   Mr Brain Wo Contrast  06/18/2015  CLINICAL DATA:  Increased right-sided weakness. Last seen well yesterday. Previous infarcts. EXAM: MRI HEAD WITHOUT CONTRAST TECHNIQUE: Multiplanar, multiecho pulse sequences of the brain and surrounding structures were obtained without intravenous contrast. COMPARISON:  CT head without contrast 06/18/2015. MRI brain 04/12/2015. FINDINGS: Focal restricted diffusion at the occipital pole represents minimal extension of the previous infarct. There is otherwise expected evolution of the previously noted left PCA territory infarct. Advanced atrophy and diffuse white matter disease is otherwise stable. The ventricles are proportionate to the degree of atrophy. No significant extra-axial fluid collection is  present. The internal auditory canals are normal bilaterally. Flow is present in the major intracranial arteries. Bilateral lens replacements are present. The globes and orbits are intact. The paranasal sinuses and the mastoid air cells are clear. The skullbase is within normal limits. Midline sagittal images are unremarkable. IMPRESSION: 1. Slight extension with acute on chronic infarct of the left occipital pole. 2. No other acute infarct. 3. Expected evolution of left occipital lobe infarct otherwise. 4. Stable advanced atrophy and diffuse white matter disease reflecting the sequela of chronic microvascular ischemia. Electronically Signed   By: San Morelle M.D.   On: 06/18/2015 17:47   Dg  Swallowing Func-speech Pathology  06/20/2015  Objective Swallowing Evaluation: Type of Study: MBS-Modified Barium Swallow Study Patient Details Name: Rose Sullivan MRN: [id number] Date of Birth: 12-02-1933 Today's Date: 06/20/2015 Time: SLP Start Time (ACUTE ONLY): 0840-SLP Stop Time (ACUTE ONLY): [id number] SLP Time Calculation (min) (ACUTE ONLY): 15 min Past Medical History: Past Medical History Diagnosis Date . GLUCOSE INTOLERANCE 02/11/2007 . HYPERLIPIDEMIA 02/11/2007 . BLINDNESS, [place], [place] DEFINITION 11/25/2006 . Other specified forms of hearing loss 09/05/2009 . HYPERTENSION 02/11/2007 . Atrial fibrillation ([place]) 03/07/2009 . GERD 02/11/2007 . LOW BACK PAIN, CHRONIC 11/25/2006 . BACK PAIN 12/10/2007 . Pain in Soft Tissues of Limb 02/11/2007 . OSTEOPENIA 02/11/2007 . FATIGUE 12/10/2007 . GAIT DISTURBANCE 12/10/2007 . DYSPNEA 03/18/2009 . Cough 03/07/2009 . CHEST PAIN 12/10/2007 . Abdominal pain, left lower quadrant 04/01/2007 . BREAST CANCER, HX OF 11/25/2006 . Nocturia 05/18/2010 . Hypersomnia 05/18/2010 . Impaired glucose tolerance 05/18/2010 . Allergic rhinitis, cause unspecified 07/23/2012 . Anxiety state, unspecified 02/04/2013 . Cancer ([place])  . Renal insufficiency 06/18/2015 Past Surgical History: Past Surgical History Procedure  Laterality Date . Dilation and curettage of uterus   . S/p mastectomy Right  . Appendectomy   . Cataract extraction   . Knee surgery Right  HPI: 82 y.o. female with CVA with residual right sided weakness, legal blindness, chronic low back pain, HTN, Pafib, Glucose intolerance, apparently per family had altered mental status and unable to talk like  normally.Per chart has had recent UTI.MRI acute on chronic infarct of the left occipital pole. CT brain negative for any acute process.As SLP documenting, neurologist reported new diagnosis of Parkinson's. CXR no acute cardiopulmonary disease. MBS 04/13/15 recommended Dys 2, nectar liquids and daughter reports pt able to return to regular texture and thin liquids. Daughter also reports coughing/strangling with po's several days prior to admit. Subjective: pt alert, believes she is coughing less with the thicker drinks Assessment / Plan / Recommendation CHL IP CLINICAL IMPRESSIONS 06/20/2015 Therapy Diagnosis Mild oral phase dysphagia;Mild pharyngeal phase dysphagia Clinical Impression Pt exhibited mild oral and phryngeal dysphagia due to min sensory and mild motor/anatomical impairments. Pt's audible swallow due to what appears to be cervical bony structures/osteophytes concomitant with mildly delayed swallow initation resulted in minimal silent laryngeal penetration (flash and remained above vocal cords) increased with use of straw. Reduced lateral mandibular movement coupled with missing dentition results in prolonged mastication and transit of Dys 2 texture. Dysphagia likely to progress as Parkinson's progresses. Recommend Dys 1 diet, thin liquids, NO straws, small cup sips pills crushed, must sit upright and full supervision  Impact on safety and function Moderate aspiration risk   CHL IP TREATMENT RECOMMENDATION 06/20/2015 Treatment Recommendations Therapy as outlined in treatment plan below   Prognosis 06/20/2015 Prognosis for Safe Diet Advancement (No Data)  Barriers to Reach Goals Cognitive deficits Barriers/Prognosis Comment -- CHL IP DIET RECOMMENDATION 06/20/2015 SLP Diet Recommendations Dysphagia 1 (Puree) solids;Thin liquid Liquid Administration via Cup;No straw Medication Administration Crushed with puree Compensations Slow rate;Small sips/bites;Minimize environmental distractions Postural Changes Seated upright at 90 degrees   CHL IP OTHER RECOMMENDATIONS 06/20/2015 Recommended Consults -- Oral Care Recommendations Oral care BID Other Recommendations --   CHL IP FOLLOW UP RECOMMENDATIONS 06/20/2015 Follow up Recommendations (No Data)   CHL IP FREQUENCY AND DURATION 06/20/2015 Speech Therapy Frequency (ACUTE ONLY) min 2x/week Treatment Duration 2 weeks      CHL IP ORAL PHASE 06/20/2015 Oral Phase Impaired Oral - Pudding Teaspoon -- Oral - Pudding Cup -- Oral - Honey Teaspoon -- Oral - Honey  Cup -- Oral - Nectar Teaspoon -- Oral - Nectar Cup -- Oral - Nectar Straw -- Oral - Thin Teaspoon -- Oral - Thin Cup -- Oral - Thin Straw -- Oral - Puree -- Oral - Mech Soft Delayed oral transit Oral - Regular -- Oral - Multi-Consistency -- Oral - Pill -- Oral Phase - Comment --  CHL IP PHARYNGEAL PHASE 06/20/2015 Pharyngeal Phase Impaired Pharyngeal- Pudding Teaspoon -- Pharyngeal -- Pharyngeal- Pudding Cup -- Pharyngeal -- Pharyngeal- Honey Teaspoon -- Pharyngeal -- Pharyngeal- Honey Cup -- Pharyngeal -- Pharyngeal- Nectar Teaspoon Pharyngeal residue - pyriform;Pharyngeal residue - valleculae;Reduced tongue base retraction;Reduced laryngeal elevation;Delayed swallow initiation-vallecula Pharyngeal -- Pharyngeal- Nectar Cup Pharyngeal residue - pyriform;Pharyngeal residue - valleculae;Reduced tongue base retraction;Reduced laryngeal elevation Pharyngeal -- Pharyngeal- Nectar Straw Penetration/Aspiration during swallow;Delayed swallow initiation-vallecula Pharyngeal Material enters airway, remains ABOVE vocal cords and not ejected out Pharyngeal- Thin Teaspoon NT Pharyngeal --  Pharyngeal- Thin Cup Penetration/Aspiration during swallow Pharyngeal Material enters airway, remains ABOVE vocal cords then ejected out Pharyngeal- Thin Straw Delayed swallow initiation-vallecula Pharyngeal -- Pharyngeal- Puree -- Pharyngeal -- Pharyngeal- Mechanical Soft Pharyngeal residue - valleculae;Reduced tongue base retraction Pharyngeal -- Pharyngeal- Regular -- Pharyngeal -- Pharyngeal- Multi-consistency -- Pharyngeal -- Pharyngeal- Pill -- Pharyngeal -- Pharyngeal Comment --  CHL IP CERVICAL ESOPHAGEAL PHASE 06/20/2015 Cervical Esophageal Phase WFL Pudding Teaspoon -- Pudding Cup -- Honey Teaspoon -- Honey Cup -- Nectar Teaspoon -- Nectar Cup -- Nectar Straw -- Thin Teaspoon -- Thin Cup -- Thin Straw -- Puree -- Mechanical Soft -- Regular -- Multi-consistency -- Pill -- Cervical Esophageal Comment -- No flowsheet data found. [place] Siren 06/20/2015, 11:56 AM Orbie Pyo Colvin Caroli.Ed CCC-SLP Pager [phone]               Time Spent in minutes  30   Blayden Conwell K M.D on 06/21/2015 at 9:55 AM  Between 7am to 7pm - Pager - [phone]  After 7pm go to [email or web address] - password [facility]  [facility] -  Office  [phone]

## 2015-06-21 NOTE — Progress Notes (Signed)
Report called to Amy RN on  unit 2 central .Patient to transfer to 2 c08 family at bedside.

## 2015-06-21 NOTE — Progress Notes (Signed)
After receiving report from day shift nurse Heather.This nurse checked patient heart rate ranging 130-150's atrial fibrillation.This nurse was told by off  going nurse that rapid response nurse would come up and start IV amiodarone on patient after their shift report and remain with patient until transfer off floor.Patient at this time is sleepy but arousable alert to self and family.Patient currently  receiving IV magnesium that was hung  by day shift RN but never started.This nurse has checked with secretary to see if patient has new bed assignment .No new bed assignment per Network engineer.Will continue to monitor.

## 2015-06-21 NOTE — Care Management Note (Signed)
Case Management Note  Patient Details  Name: Rose Sullivan MRN: [id number] Date of Birth: 11/15/1933  Subjective/Objective:                    Action/Plan: Pt started on Eliquis 5mg  BID. Benefits check revealed: Eliquis 5mg  BID Tier 3, Copay estimated between $20-$38. Prior auth is needed, [phone].   Expected Discharge Date:                  Expected Discharge Plan:     In-House Referral:     Discharge planning Services     Post Acute Care Choice:    Choice offered to:     DME Arranged:    DME Agency:     HH Arranged:    HH Agency:     Status of Service:  In process, will continue to follow  Medicare Important Message Given:    Date Medicare IM Given:    Medicare IM give by:    Date Additional Medicare IM Given:    Additional Medicare Important Message give by:     If discussed at [place] of Stay Meetings, dates discussed:    Additional Comments:  Pollie Friar, RN 06/21/2015, 12:12 PM

## 2015-06-21 NOTE — NC FL2 (Signed)
[place] LEVEL OF CARE SCREENING TOOL     IDENTIFICATION  Patient Name: Rose Sullivan Birthdate: 03-11-1933 Sex: female Admission Date (Current Location): 06/18/2015  [facility] [place] and [place] Number:  Herbalist and Address:  [facility], [place] [address], [place], [place] [address]      Provider Number: [id number]  Attending Physician Name and Address:  Thurnell Lose, MD  Relative Name and Phone Number:       Current Level of Care: Hospital Recommended Level of Care: [place] Prior Approval Number:    Date Approved/Denied:   PASRR Number: [id number]  Discharge Plan: SNF    Current Diagnoses: Patient Active Problem List   Diagnosis Date Noted  . Confusion   . Cerebrovascular accident (CVA) due to occlusion of left posterior cerebral artery ([place])   . Acute cystitis without hematuria   . Dehydration   . Weakness 06/18/2015  . Renal insufficiency 06/18/2015  . Acute respiratory failure with hypoxemia ([place]) 06/10/2015  . CVA (cerebral infarction) 04/12/2015  . AKI (acute kidney injury) ([place]) 04/12/2015  . UTI (lower urinary tract infection) 04/12/2015  . Acute UTI   . History of recent stroke   . Right sided weakness   . Acute ischemic VBA thalamic stroke ([place]) 03/16/2015  . Hemiparesis affecting dominant side as late effect of stroke ([place])   . Disorientation   . Chronic back pain   . Benign essential HTN   . Paroxysmal atrial fibrillation (HCC)   . HLD (hyperlipidemia)   . Hx of breast cancer   . Legally blind   . Atrial fibrillation with RVR ([place]) 03/15/2015  . Acute CVA (cerebrovascular accident) ([place]) 03/14/2015  . CKD (chronic kidney disease)   . Altered mental status   . Acute encephalopathy 03/13/2015  . DM (diabetes mellitus) ([place]) 03/13/2015  . Cerebral embolism with cerebral infarction 03/13/2015  . Neck pain 02/09/2014  . Paresthesia of arm 02/09/2014  . Lumbar facet arthropathy  03/09/2013  . Unspecified hereditary and idiopathic peripheral neuropathy 03/09/2013  . Osteoarthritis of right hip 03/09/2013  . Osteoarthritis of right knee 03/09/2013  . Osteoarthritis of left knee 03/09/2013  . Anxiety state, unspecified 02/04/2013  . Chronic anticoagulation 11/04/2012  . Allergic rhinitis, cause unspecified 07/23/2012  . Bilateral hearing loss 06/27/2011  . Insomnia 06/27/2011  . Breast cancer metastasized to lung ([place]) 06/05/2011  . Preventative health care 01/21/2011  . Cervical radicular pain 01/09/2011  . Lumbar radicular pain 01/09/2011  . Vitamin D deficiency 08/31/2010  . Bilateral knee pain 08/31/2010  . Impaired glucose tolerance 05/18/2010  . Hypersomnia 05/18/2010  . Nocturia 05/18/2010  . Other specified forms of hearing loss 09/05/2009  . DYSPNEA 03/18/2009  . Atrial fibrillation ([place]) 03/07/2009  . FATIGUE 12/10/2007  . GAIT DISTURBANCE 12/10/2007  . Hyperlipidemia 02/11/2007  . Essential hypertension 02/11/2007  . GERD 02/11/2007  . OSTEOPENIA 02/11/2007  . BLINDNESS, [place], [place] DEFINITION 11/25/2006  . LOW BACK PAIN, CHRONIC 11/25/2006  . BREAST CANCER, HX OF 11/25/2006    Orientation RESPIRATION BLADDER Height & Weight     Self, Place  Normal Continent Weight: 175 lb 8 oz (79.606 kg) Height:     BEHAVIORAL SYMPTOMS/MOOD NEUROLOGICAL BOWEL NUTRITION STATUS      Continent Diet (see DC summary)  AMBULATORY STATUS COMMUNICATION OF NEEDS Skin   Limited Assist Verbally Normal  Personal Care Assistance Level of Assistance  Bathing, Dressing Bathing Assistance: Limited assistance   Dressing Assistance: Limited assistance     Functional Limitations Info             SPECIAL CARE FACTORS FREQUENCY  PT (By licensed PT), OT (By licensed OT)     PT Frequency: 5/wk OT Frequency: 5/wk            Contractures      Additional Factors Info  Code Status, Allergies, Insulin Sliding Scale Code Status  Info: DNR Allergies Info: Trazodone And Nefazodone   Insulin Sliding Scale Info: 3/day       Current Medications (06/21/2015):  This is the current hospital active medication list Current Facility-Administered Medications  Medication Dose Route Frequency Provider Last Rate Last Dose  . acetaminophen (TYLENOL) suppository 650 mg  650 mg Rectal Q6H PRN Jani Gravel, MD      . apixaban Arne Cleveland) tablet 5 mg  5 mg Oral BID Thurnell Lose, MD   5 mg at 06/20/15 2202  . diltiazem (CARDIZEM CD) 24 hr capsule 240 mg  240 mg Oral Daily Jani Gravel, MD   240 mg at 06/20/15 1028  . exemestane (AROMASIN) tablet 25 mg  25 mg Oral Daily Jani Gravel, MD   25 mg at 06/20/15 1028  . insulin aspart (novoLOG) injection 0-15 Units  0-15 Units Subcutaneous TID WC Rosalin Hawking, MD   2 Units at 06/20/15 1800  . metoprolol (LOPRESSOR) injection 5 mg  5 mg Intravenous Q4H PRN Thurnell Lose, MD   5 mg at 06/20/15 0458  . pravastatin (PRAVACHOL) tablet 40 mg  40 mg Oral q1800 Jani Gravel, MD   40 mg at 06/20/15 1818  . sodium chloride flush (NS) 0.9 % injection 3 mL  3 mL Intravenous Q12H Jani Gravel, MD   3 mL at 06/20/15 2214     Discharge Medications: Please see discharge summary for a list of discharge medications.  Relevant Imaging Results:  Relevant Lab Results:   Additional Information SSN: [id number]  Cranford Mon, [place]

## 2015-06-22 LAB — GLUCOSE, CAPILLARY
GLUCOSE-CAPILLARY: 117 mg/dL — AB (ref 65–99)
Glucose-Capillary: 112 mg/dL — ABNORMAL HIGH (ref 65–99)
Glucose-Capillary: 111 mg/dL — ABNORMAL HIGH (ref 65–99)
Glucose-Capillary: 128 mg/dL — ABNORMAL HIGH (ref 65–99)
Glucose-Capillary: 106 mg/dL — ABNORMAL HIGH (ref 65–99)

## 2015-06-22 LAB — BASIC METABOLIC PANEL
Anion gap: 11 (ref 5–15)
BUN: 15 mg/dL (ref 6–20)
CALCIUM: 9 mg/dL (ref 8.9–10.3)
CHLORIDE: 111 mmol/L (ref 101–111)
CO2: 17 mmol/L — AB (ref 22–32)
CREATININE: 0.9 mg/dL (ref 0.44–1.00)
GFR calc Af Amer: >60 mL/min (ref >60)
GFR calc non Af Amer: 59 mL/min — ABNORMAL LOW (ref >60)
GLUCOSE: 113 mg/dL — AB (ref 65–99)
Potassium: 3.9 mmol/L (ref 3.5–5.1)
Sodium: 139 mmol/L (ref 135–145)

## 2015-06-22 LAB — CBC
HCT: 40.2 % (ref 36.0–46.0)
Hemoglobin: 13.3 g/dL (ref 12.0–15.0)
MCH: 30.2 pg (ref 26.0–34.0)
MCHC: 33.1 g/dL (ref 30.0–36.0)
MCV: 91.4 fL (ref 78.0–100.0)
PLATELETS: 232 10*3/uL (ref 150–400)
RBC: 4.4 MIL/uL (ref 3.87–5.11)
RDW: 13.9 % (ref 11.5–15.5)
WBC: 8.4 10*3/uL (ref 4.0–10.5)

## 2015-06-22 LAB — PHOSPHORUS: Phosphorus: 3.3 mg/dL (ref 2.5–4.6)

## 2015-06-22 LAB — MRSA PCR SCREENING: MRSA by PCR: NEGATIVE

## 2015-06-22 LAB — MAGNESIUM: MAGNESIUM: 2.3 mg/dL (ref 1.7–2.4)

## 2015-06-22 MED ORDER — AMIODARONE HCL 200 MG PO TABS
400.0000 mg | ORAL_TABLET | Freq: Every day | ORAL | Status: DC
  Administered 2015-06-22 – 2015-06-23 (×2): 400 mg via ORAL
  Filled 2015-06-22 (×2): qty 2

## 2015-06-22 MED ORDER — METOPROLOL TARTRATE 12.5 MG HALF TABLET
12.5000 mg | ORAL_TABLET | Freq: Two times a day (BID) | ORAL | Status: DC
  Administered 2015-06-22 – 2015-06-23 (×3): 12.5 mg via ORAL
  Filled 2015-06-22 (×3): qty 1

## 2015-06-22 MED ORDER — DILTIAZEM HCL 60 MG PO TABS
60.0000 mg | ORAL_TABLET | Freq: Four times a day (QID) | ORAL | Status: DC
  Administered 2015-06-22 (×3): 60 mg via ORAL
  Filled 2015-06-22 (×3): qty 1

## 2015-06-22 MED ORDER — HYDRALAZINE HCL 20 MG/ML IJ SOLN: 10.0000 mg | INTRAMUSCULAR | Status: DC | PRN

## 2015-06-22 NOTE — Progress Notes (Signed)
Speech Language Pathology Treatment: Dysphagia  Patient Details Name: Rose Sullivan MRN: [id number] DOB: 1933/12/09 Today's Date: 06/22/2015 Time: 1000-1030 SLP Time Calculation (min) (ACUTE ONLY): 30 min  Assessment / Plan / Recommendation Clinical Impression  Events of last night noted.  Pt preparing for breakfast; daughter present.  Assisted pt with meal, providing hand-over-hand assist for self-feeding, with max verbal/tactile cues needed for initiation, labial seal.  Pt with oral delays, but swallow response appeared to be protective with no overt s/s of aspiration.  Pt with significant delay in response time, but verbalizations are appropriate with regard to syntax/semantics in presence of baseline aphasia.  Educated dtr about diet/consistency preparation and maximizing nutritionally-dense foods.  She verbalizes understanding.  Continue SLP services with Rose Sullivan SLP upon D/C.    HPI HPI: 82 y.o. female with CVA with residual right sided weakness, legal blindness, chronic low back pain, HTN, Pafib, Glucose intolerance, apparently per family had altered mental status and unable to talk like  normally.Per chart has had recent UTI.MRI acute on chronic infarct of the left occipital pole. CT brain negative for any acute process.CXR no acute cardiopulmonary disease. MBS 04/13/15 recommended Dys 2, nectar liquids and daughter reports pt able to return to regular texture and thin liquids. Daughter also reports coughing/strangling with po's several days prior to admit.      SLP Plan  Continue with current plan of care     Recommendations  Diet recommendations: Dysphagia 1 (puree);Thin liquid Liquids provided via: Cup;No straw Medication Administration: Crushed with puree Supervision: Staff to assist with self feeding;Patient able to self feed;Full supervision/cueing for compensatory strategies Compensations: Slow rate;Small sips/bites;Minimize environmental distractions Postural Changes and/or  Swallow Maneuvers: Seated upright 90 degrees             Oral Care Recommendations: Oral care BID Follow up Recommendations: Home health SLP Plan: Continue with current plan of care     GO               Rose Sullivan L. Rose Sullivan, [place] CCC/SLP Pager [phone]  Rose Sullivan 06/22/2015, 10:36 AM

## 2015-06-22 NOTE — Care Management Important Message (Signed)
Important Message  Patient Details  Name: Rose Sullivan MRN: [id number] Date of Birth: Apr 10, 1933   Medicare Important Message Given:  Yes    [name] 06/22/2015, 10:42 AM

## 2015-06-22 NOTE — Progress Notes (Signed)
PROGRESS NOTE    Rose Sullivan  [id number] DOB: [birth year] DOA: 06/18/2015 PCP: [name], MD  Outpatient Specialists:   PMR-Dr. Schwartz-chronic pain Neurology Oncology-Dr. Magrinat   Brief Narrative:  82 year oldfemale Left thalamic + mesial temporal lobe infarct to the 03/13/2015 Patient has had left PCA infarcts as well Newly documented severe dysphagia-nothing by mouth status initially HTN CK D stage III-2/2 hypertensive glomerular sclerosis Prior history atrial fibrillation Legally blind Chronic back pain History of breast cancer (node negative, ER/PR +, HER2/Neu -) --s/p exemestane 03/2006, zoledronic acid, last DEXA scan reported 2011--->metastases involving lung and lymph nodes 03/2006 with good response to chemotherapy Some documentation of chronic shortness of breath? NYHA class II symptoms-some question of orthopnea as well (see office note 05/29/2012-Dr. Nishan)  Patient readmitted yet again 06/18/2015 with altered mental status Noted inability to phonate, dysphagia Somnolence ? Recent UTI-->Bactrim DS  CT head negative for acute process EEG performed negative this admission- Thought at this admission to have a new diagnosis of Parkinson's  Flipped into A. fib with RVR evening of 06/21/2015 and transferred to stepdown unit    Assessment & Plan:   Principal Problem:   Weakness Active Problems:   Impaired glucose tolerance   Altered mental status   Hemiparesis affecting dominant side as late effect of stroke (HCC)   Paroxysmal atrial fibrillation (HCC)   Renal insufficiency   Confusion   Cerebrovascular accident (CVA) due to occlusion of left posterior cerebral artery ([place])   Acute cystitis without hematuria   Dehydration   Atrial fibrillation with rapid ventricular response, [place] score >3 -Amiodarone GTT started and transitioned to by mouth amiodarone 400 mg for loading Would continue this dose for the next 7 days and then transitioned to 200  daily an outpatient follow-up with cardiology -Did restart metoprolol 12.5 twice a day 06/22/2015 -We'll attempt to transition off of Cardizem 60 every 6 in the next 24 hours -Transition out of step down unit in 24 hours if hemodynamically is stable  Encephalopathy secondary to left occipital stroke extension -Heparin has been transitioned to Elliquis -Attempting to rate control as above -Continue dysphagia 1 diet -Would discontinue every 2 neuro checks for now  Hypertension -Continue meds as above  Chronic kidney disease stage III -Creatinine on admission 19/1.4-->15/0.9 and GFR above 60 -Monitor magnesium, chem 7, phosphorus in a.m.  Impaired glucose tolerance -Monitor  ? Parkinson's-ruled out by neurology  Dysphagia -Diet recommendations: Dysphagia 1 (puree);Thin liquid Liquids provided via: Cup;No straw Medication Administration: Crushed with puree Supervision: Staff to assist with self feeding;Patient able to self feed;Full supervision/cueing for compensatory strategies Compensations: Slow rate;Small sips/bites;Minimize environmental distractions Postural Changes and/or Swallow Maneuvers: Seated upright 90 degrees   DVT prophylaxis: Elliquis Code Status: Partial code Family Communication: Discussed with family at the bedside Disposition Plan: Likely discharge home with daughter and son-in-law Poor overall prognosis if recurrent strokes  Code Status : DNR  Family Communication : Daughter over bedside  Disposition Plan : family planning on taking patient home?  Consults : Neuro  Procedures :   MRI brain. Extension of left occipital infarct.  DVT Prophylaxis :Eliquis  Subjective:  Awake Non verbal except for simple syllables noother issue Some concerns abuot [place] No cp No overt fever  Objective: Filed Vitals:   06/22/15 0000 06/22/15 0015 06/22/15 0415 06/22/15 0429  BP: 153/92 153/92 156/74   Pulse: 108 114 97   Temp:  99.5 F (37.5 C)  99.4 F (37.4 C)   TempSrc:  Axillary Rectal  Resp: _0 Height:      Weight:    80.74 kg (178 lb)  SpO2: 98% 98% 97%     Intake/Output Summary (Last 24 hours) at 06/22/15 0742 Last data filed at 06/21/15 1700  Gross per 24 hour  Intake    440 ml  Output      0 ml  Net    440 ml   Filed Weights   06/21/15 0500 06/21/15 2145 06/22/15 0429  Weight: 79.606 kg (175 lb 8 oz) 79.878 kg (176 lb 1.6 oz) 80.74 kg (178 lb)    Examination:  Awake , Chronic right-sided weakness & facial droop, also has dysarthria and dysphagia now [place].AT,PERRAL Supple Neck,No JVD, No cervical lymphadenopathy appriciated.  Symmetrical Chest wall movement, Good air movement bilaterally, CTAB RRR,No Gallops,Rubs or new Murmurs, No Parasternal Heave +ve B.Sounds, Abd Soft, No tenderness, No organomegaly appriciated, No rebound - guarding or rigidity. No Cyanosis, Clubbing or edema, No new Rash or bruise    Data Reviewed: I have personally reviewed following labs and imaging studies  CBC:  Recent Labs Lab 06/18/15 1342 06/18/15 1350 06/18/15 1840 06/19/15 0519 06/20/15 0417 06/22/15 0505  WBC 6.8  --   --  6.7 7.1 8.4  NEUTROABS 4.3  --   --  4.4  --   --   HGB 12.6 13.9  --  12.3 12.9 13.3  HCT 38.0 41.0 39.8 37.2 39.0 40.2  MCV 91.1  --   --  91.4 92.9 91.4  PLT 233  --   --  232 213 836   Basic Metabolic Panel:  Recent Labs Lab 06/19/15 0519 06/20/15 0417 06/21/15 0516 06/21/15 1825 06/22/15 0505  NA 138 139 139 140 139  K 3.7 3.4* 3.9 3.7 3.9  CL 108 113* 113* 114* 111  CO2 16* 16* 18* 19* 17*  GLUCOSE 87 115* 95 130* 113*  BUN _1 CREATININE 1.36* 0.94 1.13* 1.12* 0.90  CALCIUM 8.6* 8.7* 9.1 9.1 9.0  MG  --   --   --  1.9 2.3  PHOS  --   --   --   --  3.3   GFR: Estimated Creatinine Clearance: 48 mL/min (by C-G formula based on Cr of 0.9). Liver Function Tests:  Recent Labs Lab 06/18/15 1342 06/19/15 0519  AST 56* 54*  ALT 45 46  ALKPHOS 51 49    BILITOT 0.7 0.6  PROT 6.3* 6.3*  ALBUMIN 3.3* 3.2*   No results for input(s): LIPASE, AMYLASE in the last 168 hours. No results for input(s): AMMONIA in the last 168 hours. Coagulation Profile:  Recent Labs Lab 06/18/15 1342  INR 1.91*   Cardiac Enzymes: No results for input(s): CKTOTAL, CKMB, CKMBINDEX, TROPONINI in the last 168 hours. BNP (last 3 results) No results for input(s): PROBNP in the last 8760 hours. HbA1C: No results for input(s): HGBA1C in the last 72 hours. CBG:  Recent Labs Lab 06/20/15 2020 06/21/15 0627 06/21/15 1119 06/21/15 1612 06/21/15 2216  GLUCAP 107* 108* 100* 91 117*   Lipid Profile:  Recent Labs  06/20/15 0417  CHOL 109  HDL 33*  LDLCALC 59  TRIG 84  CHOLHDL 3.3   Thyroid Function Tests: No results for input(s): TSH, T4TOTAL, FREET4, T3FREE, THYROIDAB in the last 72 hours. Anemia Panel: No results for input(s): VITAMINB12, FOLATE, FERRITIN, TIBC, IRON, RETICCTPCT in the last 72 hours. Urine analysis:    Component Value Date/Time   COLORURINE YELLOW  06/19/2015 1027   APPEARANCEUR CLOUDY* 06/19/2015 1027   LABSPEC 1.019 06/19/2015 1027   PHURINE 6.0 06/19/2015 1027   GLUCOSEU NEGATIVE 06/19/2015 1027   GLUCOSEU NEGATIVE 06/10/2015 1631   HGBUR NEGATIVE 06/19/2015 1027   BILIRUBINUR NEGATIVE 06/19/2015 1027   BILIRUBINUR negative 06/14/2015 1403   KETONESUR 15* 06/19/2015 1027   PROTEINUR NEGATIVE 06/19/2015 1027   PROTEINUR 2+ 06/14/2015 1403   UROBILINOGEN negative 06/14/2015 1403   UROBILINOGEN 1.0 06/10/2015 1631   NITRITE NEGATIVE 06/19/2015 1027   NITRITE negative 06/14/2015 1403   LEUKOCYTESUR NEGATIVE 06/19/2015 1027   Sepsis Labs: @LABRCNTIP(procalcitonin:4,lacticidven:4)  ) Recent Results (from the past 240 hour(s))  Urine culture     Status: None   Collection Time: 06/19/15 10:27 AM  Result Value Ref Range Status   Specimen Description URINE, CATHETERIZED  Final   Special Requests NONE  Final   Culture NO  GROWTH  Final   Report Status 06/20/2015 FINAL  Final  Culture, blood (routine x 2)     Status: None (Preliminary result)   Collection Time: 06/19/15  3:50 PM  Result Value Ref Range Status   Specimen Description BLOOD LEFT HAND  Final   Special Requests BOTTLES DRAWN AEROBIC AND ANAEROBIC 10CC   Final   Culture NO GROWTH 2 DAYS  Final   Report Status PENDING  Incomplete  Culture, blood (routine x 2)     Status: None (Preliminary result)   Collection Time: 06/19/15  4:15 PM  Result Value Ref Range Status   Specimen Description BLOOD LEFT WRIST  Final   Special Requests BOTTLES DRAWN AEROBIC AND ANAEROBIC 10CC   Final   Culture NO GROWTH 2 DAYS  Final   Report Status PENDING  Incomplete  MRSA PCR Screening     Status: None   Collection Time: 06/21/15 10:17 PM  Result Value Ref Range Status   MRSA by PCR NEGATIVE NEGATIVE Final    Comment:        The GeneXpert MRSA Assay (FDA approved for NASAL specimens only), is one component of a comprehensive MRSA colonization surveillance program. It is not intended to diagnose MRSA infection nor to guide or monitor treatment for MRSA infections.          Radiology Studies: Dg Swallowing Func-speech Pathology  06/20/2015  Objective Swallowing Evaluation: Type of Study: MBS-Modified Barium Swallow Study Patient Details Name: Rose Sullivan MRN: [id number] Date of Birth: 02/18/1933 Today's Date: 06/20/2015 Time: SLP Start Time (ACUTE ONLY): 0840-SLP Stop Time (ACUTE ONLY): 0855 SLP Time Calculation (min) (ACUTE ONLY): 15 min Past Medical History: Past Medical History Diagnosis Date . GLUCOSE INTOLERANCE 02/11/2007 . HYPERLIPIDEMIA 02/11/2007 . BLINDNESS, [place], [place] DEFINITION 11/25/2006 . Other specified forms of hearing loss 09/05/2009 . HYPERTENSION 02/11/2007 . Atrial fibrillation (HCC) 03/07/2009 . GERD 02/11/2007 . LOW BACK PAIN, CHRONIC 11/25/2006 . BACK PAIN 12/10/2007 . Pain in Soft Tissues of Limb 02/11/2007 . OSTEOPENIA 02/11/2007 . FATIGUE  12/10/2007 . GAIT DISTURBANCE 12/10/2007 . DYSPNEA 03/18/2009 . Cough 03/07/2009 . CHEST PAIN 12/10/2007 . Abdominal pain, left lower quadrant 04/01/2007 . BREAST CANCER, HX OF 11/25/2006 . Nocturia 05/18/2010 . Hypersomnia 05/18/2010 . Impaired glucose tolerance 05/18/2010 . Allergic rhinitis, cause unspecified 07/23/2012 . Anxiety state, unspecified 02/04/2013 . Cancer (HCC)  . Renal insufficiency 06/18/2015 Past Surgical History: Past Surgical History Procedure Laterality Date . Dilation and curettage of uterus   . S/p mastectomy Right  . Appendectomy   . Cataract extraction   . Knee surgery Right  HPI: 82 y.o.   female with CVA with residual right sided weakness, legal blindness, chronic low back pain, HTN, Pafib, Glucose intolerance, apparently per family had altered mental status and unable to talk like  normally.Per chart has had recent UTI.MRI acute on chronic infarct of the left occipital pole. CT brain negative for any acute process.As SLP documenting, neurologist reported new diagnosis of Parkinson's. CXR no acute cardiopulmonary disease. MBS 04/13/15 recommended Dys 2, nectar liquids and daughter reports pt able to return to regular texture and thin liquids. Daughter also reports coughing/strangling with po's several days prior to admit. Subjective: pt alert, believes she is coughing less with the thicker drinks Assessment / Plan / Recommendation CHL IP CLINICAL IMPRESSIONS 06/20/2015 Therapy Diagnosis Mild oral phase dysphagia;Mild pharyngeal phase dysphagia Clinical Impression Pt exhibited mild oral and phryngeal dysphagia due to min sensory and mild motor/anatomical impairments. Pt's audible swallow due to what appears to be cervical bony structures/osteophytes concomitant with mildly delayed swallow initation resulted in minimal silent laryngeal penetration (flash and remained above vocal cords) increased with use of straw. Reduced lateral mandibular movement coupled with missing dentition results in prolonged  mastication and transit of Dys 2 texture. Dysphagia likely to progress as Parkinson's progresses. Recommend Dys 1 diet, thin liquids, NO straws, small cup sips pills crushed, must sit upright and full supervision  Impact on safety and function Moderate aspiration risk   CHL IP TREATMENT RECOMMENDATION 06/20/2015 Treatment Recommendations Therapy as outlined in treatment plan below   Prognosis 06/20/2015 Prognosis for Safe Diet Advancement (No Data) Barriers to Reach Goals Cognitive deficits Barriers/Prognosis Comment -- CHL IP DIET RECOMMENDATION 06/20/2015 SLP Diet Recommendations Dysphagia 1 (Puree) solids;Thin liquid Liquid Administration via Cup;No straw Medication Administration Crushed with puree Compensations Slow rate;Small sips/bites;Minimize environmental distractions Postural Changes Seated upright at 90 degrees   CHL IP OTHER RECOMMENDATIONS 06/20/2015 Recommended Consults -- Oral Care Recommendations Oral care BID Other Recommendations --   CHL IP FOLLOW UP RECOMMENDATIONS 06/20/2015 Follow up Recommendations (No Data)   CHL IP FREQUENCY AND DURATION 06/20/2015 Speech Therapy Frequency (ACUTE ONLY) min 2x/week Treatment Duration 2 weeks      CHL IP ORAL PHASE 06/20/2015 Oral Phase Impaired Oral - Pudding Teaspoon -- Oral - Pudding Cup -- Oral - Honey Teaspoon -- Oral - Honey Cup -- Oral - Nectar Teaspoon -- Oral - Nectar Cup -- Oral - Nectar Straw -- Oral - Thin Teaspoon -- Oral - Thin Cup -- Oral - Thin Straw -- Oral - Puree -- Oral - Mech Soft Delayed oral transit Oral - Regular -- Oral - Multi-Consistency -- Oral - Pill -- Oral Phase - Comment --  CHL IP PHARYNGEAL PHASE 06/20/2015 Pharyngeal Phase Impaired Pharyngeal- Pudding Teaspoon -- Pharyngeal -- Pharyngeal- Pudding Cup -- Pharyngeal -- Pharyngeal- Honey Teaspoon -- Pharyngeal -- Pharyngeal- Honey Cup -- Pharyngeal -- Pharyngeal- Nectar Teaspoon Pharyngeal residue - pyriform;Pharyngeal residue - valleculae;Reduced tongue base retraction;Reduced  laryngeal elevation;Delayed swallow initiation-vallecula Pharyngeal -- Pharyngeal- Nectar Cup Pharyngeal residue - pyriform;Pharyngeal residue - valleculae;Reduced tongue base retraction;Reduced laryngeal elevation Pharyngeal -- Pharyngeal- Nectar Straw Penetration/Aspiration during swallow;Delayed swallow initiation-vallecula Pharyngeal Material enters airway, remains ABOVE vocal cords and not ejected out Pharyngeal- Thin Teaspoon NT Pharyngeal -- Pharyngeal- Thin Cup Penetration/Aspiration during swallow Pharyngeal Material enters airway, remains ABOVE vocal cords then ejected out Pharyngeal- Thin Straw Delayed swallow initiation-vallecula Pharyngeal -- Pharyngeal- Puree -- Pharyngeal -- Pharyngeal- Mechanical Soft Pharyngeal residue - valleculae;Reduced tongue base retraction Pharyngeal -- Pharyngeal- Regular -- Pharyngeal -- Pharyngeal- Multi-consistency -- Pharyngeal -- Pharyngeal- Pill -- Pharyngeal --   Pharyngeal Comment --  CHL IP CERVICAL ESOPHAGEAL PHASE 06/20/2015 Cervical Esophageal Phase WFL Pudding Teaspoon -- Pudding Cup -- Honey Teaspoon -- Honey Cup -- Nectar Teaspoon -- Nectar Cup -- Nectar Straw -- Thin Teaspoon -- Thin Cup -- Thin Straw -- Puree -- Mechanical Soft -- Regular -- Multi-consistency -- Pill -- Cervical Esophageal Comment -- No flowsheet data found. Litaker, Lisa Willis 06/20/2015, 11:56 AM Lisa Willis Litaker M.Ed CCC-SLP Pager [phone]                   Scheduled Meds: . amiodarone  400 mg Oral Daily  . apixaban  5 mg Oral BID  . diltiazem  60 mg Oral Q6H  . exemestane  25 mg Oral Daily  . insulin aspart  0-15 Units Subcutaneous TID WC  . metoprolol tartrate  12.5 mg Oral BID  . pravastatin  40 mg Oral q1800  . sodium chloride flush  3 mL Intravenous Q12H   Continuous Infusions:     LOS: 3 days    Time spent: 35    Jai , MD [facility] (P) [phone]   If 7PM-7AM, please contact night-coverage [email or web address] Password [email or web address] 06/22/2015, 7:42 AM       

## 2015-06-22 NOTE — Progress Notes (Addendum)
Physical Therapy Treatment Patient Details Name: Rose Sullivan MRN: [id number] DOB: 1933-06-29 Today's Date: 06/22/2015    History of Present Illness Patient is a 82 y/o female with hx of CVA, DM, HTN, A-fib, breast ca and anxiety presents with AMS. MRI showed extension of left occipital infarct.     PT Comments    Updated follow up recommendations to CIR, pt's daughter is open to speaking w/ a rehab admission coordinator.  Believe that pt will be appropriate for home w/ HHPT and 24/7 assist from daughter if CIR is not an option.  Daughter will need additional education on safe techniques w/ mobility which began today.  Pt currently requires additional min assist from this PT when daughter assisting pt w/ stand pivot transfers but anticipate pt and daughter will make excellent progress w/ additional therapy sessions.    Follow Up Recommendations  Supervision/Assistance - 24 hour;Supervision for mobility/OOB;CIR (Will continue family education, transfers/short distance amb)     Equipment Recommendations  None recommended by PT    Recommendations for Other Services OT consult     Precautions / Restrictions Precautions Precautions: Fall Precaution Comments: legally blind Restrictions Weight Bearing Restrictions: No    Mobility  Bed Mobility Overal bed mobility: Needs Assistance Bed Mobility: Supine to Sit     Supine to sit: Min assist;HOB elevated     General bed mobility comments: Cues for hand placement to utilize rail.  Assist managing LEs and using bed pad for positioning.  Cues for sequencing.  Transfers Overall transfer level: Needs assistance Equipment used: Rolling walker (2 wheeled) Transfers: Sit to/from [facility] Sit to Stand: Min assist;Mod assist Stand pivot transfers: Mod assist       General transfer comment: Initial sit<>stand and stand pivot performed w/ this PT, pt required min assist w/ max verbal and tactile cues for hand  placement and technique.  Noted to have small BM.  Daughter main assist for pivot to [facility] from chair w/ this PT providing min assist to steady.  Cues provided to the daughter for technique during pivot.  Key points: 1 hand on gait belt, one hand on RW to assist w/ management, cue pt to stand upright, count to 3 to stand.  Ambulation/Gait             General Gait Details: Pt too fatigued after 3 stand pivot transfers, will attempt to progress at next session.   Stairs            Wheelchair Mobility    Modified Rankin (Stroke Patients Only) Modified Rankin (Stroke Patients Only) Pre-Morbid Rankin Score: Moderately severe disability Modified Rankin: Moderately severe disability     Balance Overall balance assessment: Needs assistance Sitting-balance support: Feet supported;Bilateral upper extremity supported Sitting balance-Leahy Scale: Poor Sitting balance - Comments: Pt has poor safety awareness and attempts to stand multiple times before environment set up safely.  Emphasized to daughter that pt will need to have 24/7 supervision due to increased risk for falls.   Standing balance support: Bilateral upper extremity supported;During functional activity Standing balance-Leahy Scale: Poor Standing balance comment: RW and physical assist for support                    Cognition Arousal/Alertness: Awake/alert Behavior During Therapy: Flat affect Overall Cognitive Status: Difficult to assess                      Exercises      General Comments General  comments (skin integrity, edema, etc.): HR highest at 103 w/ stand pivot.  All other VSS. Pt's bed at home has ability to elevate head and has rails.        Pertinent Vitals/Pain Pain Assessment: Faces Faces Pain Scale: No hurt Pain Intervention(s): Limited activity within patient's tolerance;Monitored during session    Home Living                      Prior Function            PT Goals  (current goals can now be found in the care plan section) Acute Rehab PT Goals Patient Stated Goal: none stated but per daughter to go home PT Goal Formulation: With patient/family Time For Goal Achievement: 07/05/15 Potential to Achieve Goals: Good Progress towards PT goals: Progressing toward goals    Frequency  Min 4X/week    PT Plan Frequency needs to be updated    Co-evaluation             End of Session Equipment Utilized During Treatment: Gait belt Activity Tolerance: Patient limited by fatigue Patient left: in chair;with call bell/phone within reach;with chair alarm set;with family/visitor present     Time: [id number] PT Time Calculation (min) (ACUTE ONLY): 38 min  Charges:  $Therapeutic Activity: 38-52 mins                    G Codes:       Collie Siad PT, DPT  Pager: [phone] Phone: [phone] 06/22/2015, 4:42 PM

## 2015-06-23 ENCOUNTER — Other Ambulatory Visit: Payer: Self-pay | Admitting: Internal Medicine

## 2015-06-23 ENCOUNTER — Other Ambulatory Visit: Payer: Self-pay | Admitting: Physical Medicine & Rehabilitation

## 2015-06-23 DIAGNOSIS — I63532 Cerebral infarction due to unspecified occlusion or stenosis of left posterior cerebral artery: Secondary | ICD-10-CM

## 2015-06-23 LAB — URINALYSIS, ROUTINE W REFLEX MICROSCOPIC
Bilirubin Urine: NEGATIVE
GLUCOSE, UA: NEGATIVE mg/dL
Hgb urine dipstick: NEGATIVE
KETONES UR: NEGATIVE mg/dL
Nitrite: NEGATIVE
PROTEIN: NEGATIVE mg/dL
Specific Gravity, Urine: 1.017 (ref 1.005–1.030)
pH: 5 (ref 5.0–8.0)

## 2015-06-23 LAB — COMPREHENSIVE METABOLIC PANEL
ALBUMIN: 3.3 g/dL — AB (ref 3.5–5.0)
ALT: 58 U/L — ABNORMAL HIGH (ref 14–54)
ANION GAP: 8 (ref 5–15)
AST: 59 U/L — AB (ref 15–41)
Alkaline Phosphatase: 51 U/L (ref 38–126)
BILIRUBIN TOTAL: 0.7 mg/dL (ref 0.3–1.2)
BUN: 20 mg/dL (ref 6–20)
CHLORIDE: 111 mmol/L (ref 101–111)
CO2: 21 mmol/L — ABNORMAL LOW (ref 22–32)
Calcium: 9.6 mg/dL (ref 8.9–10.3)
Creatinine, Ser: 1.26 mg/dL — ABNORMAL HIGH (ref 0.44–1.00)
GFR, EST AFRICAN AMERICAN: 45 mL/min — AB (ref >60)
GFR, EST NON AFRICAN AMERICAN: 39 mL/min — AB (ref >60)
Glucose, Bld: 117 mg/dL — ABNORMAL HIGH (ref 65–99)
POTASSIUM: 4.3 mmol/L (ref 3.5–5.1)
SODIUM: 140 mmol/L (ref 135–145)
TOTAL PROTEIN: 6.2 g/dL — AB (ref 6.5–8.1)

## 2015-06-23 LAB — URINE MICROSCOPIC-ADD ON: SQUAMOUS EPITHELIAL / LPF: NONE SEEN

## 2015-06-23 LAB — GLUCOSE, CAPILLARY
GLUCOSE-CAPILLARY: 103 mg/dL — AB (ref 65–99)
Glucose-Capillary: 110 mg/dL — ABNORMAL HIGH (ref 65–99)

## 2015-06-23 LAB — MAGNESIUM: Magnesium: 2 mg/dL (ref 1.7–2.4)

## 2015-06-23 LAB — PHOSPHORUS: Phosphorus: 3.4 mg/dL (ref 2.5–4.6)

## 2015-06-23 MED ORDER — AMIODARONE HCL 200 MG PO TABS: 200.0000 mg | ORAL_TABLET | Freq: Every day | ORAL | Status: DC

## 2015-06-23 MED ORDER — APIXABAN 5 MG PO TABS: 5.0000 mg | ORAL_TABLET | Freq: Two times a day (BID) | ORAL | Status: DC

## 2015-06-23 MED ORDER — MAGNESIUM OXIDE 400 MG PO TABS: 400.0000 mg | ORAL_TABLET | Freq: Every day | ORAL | Status: DC

## 2015-06-23 MED ORDER — AMIODARONE HCL 400 MG PO TABS: 400.0000 mg | ORAL_TABLET | Freq: Every day | ORAL | Status: DC

## 2015-06-23 NOTE — Discharge Summary (Signed)
Physician Discharge Summary  Rose Sullivan [id number] DOB: [birth year] DOA: 06/18/2015  PCP: [name], MD  Admit date: 06/18/2015 Discharge date: 06/23/2015  Time spent: 40 minutes  Recommendations for Outpatient Follow-up:  1. See taper dosing of Amio starting 6/1 2. Recommend rpt LFT/TSH in 1 week as OP at Cardiology office--loaded with Amio this admit 3. recommend also Mag level--d/c home on mag ox 4. Family interested in f/u Dr. Erlinda Hong neurology--floor secretary to arrange f/u ~ 1 mo 5. Poor overall prognosis with recurrent strokes-recommen PCP address this holistically f pt develops adult failure to thrive  Discharge Diagnoses:  Principal Problem:   Weakness Active Problems:   Impaired glucose tolerance   Altered mental status   Hemiparesis affecting dominant side as late effect of stroke (HCC)   Paroxysmal atrial fibrillation (HCC)   Renal insufficiency   Confusion   Cerebrovascular accident (CVA) due to occlusion of left posterior cerebral artery ([place])   Acute cystitis without hematuria   Dehydration   Discharge Condition: fair to gaurded  Diet recommendation:  Dysphagia -Diet recommendations: Dysphagia 1 (puree);Thin liquid Liquids provided via: Cup;No straw  Filed Weights   06/21/15 0500 06/21/15 2145 06/22/15 0429  Weight: 79.606 kg (175 lb 8 oz) 79.878 kg (176 lb 1.6 oz) 80.74 kg (178 lb)    History of present illness:    Atrial fibrillation with rapid ventricular response, [place] score >3 -Amiodarone GTT started and transitioned to by mouth amiodarone 400 mg for loading Would continue this dose for the next 7 days and then transitioned to 200 daily an outpatient follow-up with cardiology on 06/28/2015---15:00 -will forward note to Dr. Jacklynn Ganong -Did restart metoprolol 12.5 twice a day 06/22/2015 - transitioned off of Cardizem 60 every 6 hours  Encephalopathy secondary to left occipital stroke extension -Heparin has been transitioned to  Elliquis -Attempting to rate control as above -Continue dysphagia 1 diet -Would discontinue every 2 neuro checks for now  Hypertension -Continue meds as above  Chronic kidney disease stage III -Creatinine on admission 19/1.4-->15/0.9 and GFR above 60 -Monitor magnesium, chem 7, phosphorus as OP  Impaired glucose tolerance -Monitor  ? Parkinson's-ruled out by neurology  Dysphagia -Diet recommendations: Dysphagia 1 (puree);Thin liquid Liquids provided via: Cup;No straw Medication Administration: Crushed with puree Supervision: Staff to assist with self feeding;Patient able to self feed;Full supervision/cueing for compensatory strategies Compensations: Slow rate;Small sips/bites;Minimize environmental distractions Postural Changes and/or Swallow Maneuvers: Seated upright 90 degrees   Consultations:  telephone consulted Dr. Gwenlyn Found per Dr. Keturah Barre note  Discharge Exam: Filed Vitals:   06/23/15 0845 06/23/15 0924  BP: 154/70 131/72  Pulse: 88 81  Temp: 99.5 F (37.5 C)   Resp: 21     General: eomi ncat--sleepy.  Poor rest last pm Cardiovascular: s1 s2 n0 m/r/g--intermittent irregular but predom NSR overnight Respiratory: clear  Discharge Instructions   Discharge Instructions    Diet - low sodium heart healthy    Complete by:  As directed      Discharge instructions    Complete by:  As directed   continue Amiodarone at 400 mg dose daily until 06/30/15  Then transition to 200 mg dose until you are seen by Dr. Jacklynn Ganong of cardiology who can further adjust your medication continue Magnesium oxide 400 daily Gt labwork done at cardiology office in ~ 1 week     Increase activity slowly    Complete by:  As directed           Current Discharge Medication  List    START taking these medications   Details  !! amiodarone (PACERONE) 200 MG tablet Take 1 tablet (200 mg total) by mouth daily. Qty: 14 tablet, Refills: 0    !! amiodarone (PACERONE) 400 MG tablet Take 1  tablet (400 mg total) by mouth daily. Qty: 8 tablet, Refills: 0    magnesium oxide (MAG-OX) 400 MG tablet Take 1 tablet (400 mg total) by mouth daily. Qty: 15 tablet, Refills: 0     !! - Potential duplicate medications found. Please discuss with provider.    CONTINUE these medications which have CHANGED   Details  apixaban (ELIQUIS) 5 MG TABS tablet Take 1 tablet (5 mg total) by mouth 2 (two) times daily. Qty: 60 tablet, Refills: 0      CONTINUE these medications which have NOT CHANGED   Details  Diclofenac Sodium 1.5 % SOLN Place 2 mLs onto the skin 3 (three) times daily. Qty: 3 Bottle, Refills: 3   Associated Diagnoses: Primary osteoarthritis of right hip; Primary osteoarthritis of left knee; Lumbar facet arthropathy    exemestane (AROMASIN) 25 MG tablet TAKE 1 TABLET BY MOUTH EVERY DAY Qty: 30 tablet, Refills: 1    metoprolol tartrate (LOPRESSOR) 25 MG tablet Take 0.5 tablets (12.5 mg total) by mouth 2 (two) times daily. Qty: 60 tablet, Refills: 1    oxyCODONE-acetaminophen (PERCOCET) 7.5-325 MG tablet Take 1 tablet by mouth every 8 (eight) hours as needed. Qty: 120 tablet, Refills: 0   Associated Diagnoses: Lumbar facet arthropathy; Primary osteoarthritis of left knee; Primary osteoarthritis of right hip; Primary osteoarthritis of right knee    Pramoxine-Camphor-Zinc Acetate (ANTI ITCH EX) Apply 1 application topically as needed (for itching).    pravastatin (PRAVACHOL) 40 MG tablet TAKE 1 TABLET BY MOUTH EVERY DAY AT 6 PM Qty: 30 tablet, Refills: 0      STOP taking these medications     diltiazem (CARDIZEM CD) 240 MG 24 hr capsule      sulfamethoxazole-trimethoprim (BACTRIM DS,SEPTRA DS) 800-160 MG tablet        Allergies  Allergen Reactions  . Trazodone And Nefazodone Other (See Comments)    hallucinations      The results of significant diagnostics from this hospitalization (including imaging, microbiology, ancillary and laboratory) are listed below for  reference.    Significant Diagnostic Studies: Dg Chest 1 View  06/10/2015  CLINICAL DATA:  Per family, pt having sob x 2 weeks. Also recently diagnosed and treated for UTI. Only one view chest done, pt in wheelchair without removable arms and could not stand for images. EXAM: CHEST 1 VIEW COMPARISON:  03/13/2015 FINDINGS: Heart size is within normal limits. Small right pleural effusion is present. There are no focal consolidations. No pulmonary edema. IMPRESSION: Small right pleural effusion. Electronically Signed   By: Nolon Nations M.D.   On: 06/10/2015 18:08   Dg Chest 2 View  06/18/2015  CLINICAL DATA:  82 year old who woke up this morning with right-sided weakness. Personal history of breast cancer. Current history of hypertension and atrial fibrillation. EXAM: CHEST  2 VIEW COMPARISON:  06/10/2015 and earlier. FINDINGS: AP semi-erect and lateral images were obtained. Suboptimal inspiration accounts for crowded bronchovascular markings, especially in the bases, and accentuates the cardiac silhouette. Taking this into account, cardiac silhouette normal in size. Lungs clear. Bronchovascular markings normal. Pulmonary vascularity normal. No visible pleural effusions. Stable chronic elevation of the right hemidiaphragm. Bone infarcts in the proximal bilateral humeri. Degenerative changes throughout the thoracic spine. IMPRESSION: Suboptimal inspiration.  No acute cardiopulmonary disease. Electronically Signed   By: Evangeline Dakin M.D.   On: 06/18/2015 18:44   Ct Head Wo Contrast  06/18/2015  CLINICAL DATA:  82 year old female with a history of right-sided weakness EXAM: CT HEAD WITHOUT CONTRAST TECHNIQUE: Contiguous axial images were obtained from the base of the skull through the vertex without intravenous contrast. COMPARISON:  MR 04/12/2015, CT head 04/11/2015 FINDINGS: Unremarkable appearance of the calvarium without acute fracture or aggressive lesion. Unremarkable appearance of the scalp soft  tissues. Unremarkable appearance of the bilateral orbits. Mastoid air cells are clear. No significant paranasal sinus disease No acute intracranial hemorrhage.  No midline shift or mass effect. Volume loss with expansion of the ventricles, not out of proportion to the degree of sulcal prominence. Encephalomalacia of the left occipital region compatible with remote infarction. Gray-white differentiation maintained. Intracranial atherosclerosis. IMPRESSION: No CT evidence of acute intracranial abnormality. Changes of chronic microvascular ischemic disease, as well as encephalomalacia of prior left occipital infarction. Signed, Dulcy Fanny. Earleen Newport, DO [facility] Electronically Signed   By: Corrie Mckusick D.O.   On: 06/18/2015 15:10   Mr Brain Wo Contrast  06/18/2015  CLINICAL DATA:  Increased right-sided weakness. Last seen well yesterday. Previous infarcts. EXAM: MRI HEAD WITHOUT CONTRAST TECHNIQUE: Multiplanar, multiecho pulse sequences of the brain and surrounding structures were obtained without intravenous contrast. COMPARISON:  CT head without contrast 06/18/2015. MRI brain 04/12/2015. FINDINGS: Focal restricted diffusion at the occipital pole represents minimal extension of the previous infarct. There is otherwise expected evolution of the previously noted left PCA territory infarct. Advanced atrophy and diffuse white matter disease is otherwise stable. The ventricles are proportionate to the degree of atrophy. No significant extra-axial fluid collection is present. The internal auditory canals are normal bilaterally. Flow is present in the major intracranial arteries. Bilateral lens replacements are present. The globes and orbits are intact. The paranasal sinuses and the mastoid air cells are clear. The skullbase is within normal limits. Midline sagittal images are unremarkable. IMPRESSION: 1. Slight extension with acute on chronic infarct of the left  occipital pole. 2. No other acute infarct. 3. Expected evolution of left occipital lobe infarct otherwise. 4. Stable advanced atrophy and diffuse white matter disease reflecting the sequela of chronic microvascular ischemia. Electronically Signed   By: San Morelle M.D.   On: 06/18/2015 17:47   Dg Swallowing Func-speech Pathology  06/20/2015  Objective Swallowing Evaluation: Type of Study: MBS-Modified Barium Swallow Study Patient Details Name: CRIS HOLVERSON MRN: [id number] Date of Birth: [birth year] Today's Date: 06/20/2015 Time: SLP Start Time (ACUTE ONLY): 0840-SLP Stop Time (ACUTE ONLY): 0855 SLP Time Calculation (min) (ACUTE ONLY): 15 min Past Medical History: Past Medical History Diagnosis Date . GLUCOSE INTOLERANCE 02/11/2007 . HYPERLIPIDEMIA 02/11/2007 . BLINDNESS, [place], [place] DEFINITION 11/25/2006 . Other specified forms of hearing loss 09/05/2009 . HYPERTENSION 02/11/2007 . Atrial fibrillation ([place]) 03/07/2009 . GERD 02/11/2007 . LOW BACK PAIN, CHRONIC 11/25/2006 . BACK PAIN 12/10/2007 . Pain in Soft Tissues of Limb 02/11/2007 . OSTEOPENIA 02/11/2007 . FATIGUE 12/10/2007 . GAIT DISTURBANCE 12/10/2007 . DYSPNEA 03/18/2009 . Cough 03/07/2009 . CHEST PAIN 12/10/2007 . Abdominal pain, left lower quadrant 04/01/2007 . BREAST CANCER, HX OF 11/25/2006 . Nocturia 05/18/2010 . Hypersomnia 05/18/2010 . Impaired glucose tolerance 05/18/2010 . Allergic rhinitis, cause unspecified 07/23/2012 . Anxiety state, unspecified 02/04/2013 . Cancer ([place])  . Renal insufficiency 06/18/2015 Past Surgical History: Past Surgical History Procedure Laterality Date . Dilation and curettage of uterus   .  S/p mastectomy Right  . Appendectomy   . Cataract extraction   . Knee surgery Right  HPI: 82 y.o. female with CVA with residual right sided weakness, legal blindness, chronic low back pain, HTN, Pafib, Glucose intolerance, apparently per family had altered mental status and unable to talk like  normally.Per chart has had recent UTI.MRI acute on  chronic infarct of the left occipital pole. CT brain negative for any acute process.As SLP documenting, neurologist reported new diagnosis of Parkinson's. CXR no acute cardiopulmonary disease. MBS 04/13/15 recommended Dys 2, nectar liquids and daughter reports pt able to return to regular texture and thin liquids. Daughter also reports coughing/strangling with po's several days prior to admit. Subjective: pt alert, believes she is coughing less with the thicker drinks Assessment / Plan / Recommendation CHL IP CLINICAL IMPRESSIONS 06/20/2015 Therapy Diagnosis Mild oral phase dysphagia;Mild pharyngeal phase dysphagia Clinical Impression Pt exhibited mild oral and phryngeal dysphagia due to min sensory and mild motor/anatomical impairments. Pt's audible swallow due to what appears to be cervical bony structures/osteophytes concomitant with mildly delayed swallow initation resulted in minimal silent laryngeal penetration (flash and remained above vocal cords) increased with use of straw. Reduced lateral mandibular movement coupled with missing dentition results in prolonged mastication and transit of Dys 2 texture. Dysphagia likely to progress as Parkinson's progresses. Recommend Dys 1 diet, thin liquids, NO straws, small cup sips pills crushed, must sit upright and full supervision  Impact on safety and function Moderate aspiration risk   CHL IP TREATMENT RECOMMENDATION 06/20/2015 Treatment Recommendations Therapy as outlined in treatment plan below   Prognosis 06/20/2015 Prognosis for Safe Diet Advancement (No Data) Barriers to Reach Goals Cognitive deficits Barriers/Prognosis Comment -- CHL IP DIET RECOMMENDATION 06/20/2015 SLP Diet Recommendations Dysphagia 1 (Puree) solids;Thin liquid Liquid Administration via Cup;No straw Medication Administration Crushed with puree Compensations Slow rate;Small sips/bites;Minimize environmental distractions Postural Changes Seated upright at 90 degrees   CHL IP OTHER RECOMMENDATIONS  06/20/2015 Recommended Consults -- Oral Care Recommendations Oral care BID Other Recommendations --   CHL IP FOLLOW UP RECOMMENDATIONS 06/20/2015 Follow up Recommendations (No Data)   CHL IP FREQUENCY AND DURATION 06/20/2015 Speech Therapy Frequency (ACUTE ONLY) min 2x/week Treatment Duration 2 weeks      CHL IP ORAL PHASE 06/20/2015 Oral Phase Impaired Oral - Pudding Teaspoon -- Oral - Pudding Cup -- Oral - Honey Teaspoon -- Oral - Honey Cup -- Oral - Nectar Teaspoon -- Oral - Nectar Cup -- Oral - Nectar Straw -- Oral - Thin Teaspoon -- Oral - Thin Cup -- Oral - Thin Straw -- Oral - Puree -- Oral - Mech Soft Delayed oral transit Oral - Regular -- Oral - Multi-Consistency -- Oral - Pill -- Oral Phase - Comment --  CHL IP PHARYNGEAL PHASE 06/20/2015 Pharyngeal Phase Impaired Pharyngeal- Pudding Teaspoon -- Pharyngeal -- Pharyngeal- Pudding Cup -- Pharyngeal -- Pharyngeal- Honey Teaspoon -- Pharyngeal -- Pharyngeal- Honey Cup -- Pharyngeal -- Pharyngeal- Nectar Teaspoon Pharyngeal residue - pyriform;Pharyngeal residue - valleculae;Reduced tongue base retraction;Reduced laryngeal elevation;Delayed swallow initiation-vallecula Pharyngeal -- Pharyngeal- Nectar Cup Pharyngeal residue - pyriform;Pharyngeal residue - valleculae;Reduced tongue base retraction;Reduced laryngeal elevation Pharyngeal -- Pharyngeal- Nectar Straw Penetration/Aspiration during swallow;Delayed swallow initiation-vallecula Pharyngeal Material enters airway, remains ABOVE vocal cords and not ejected out Pharyngeal- Thin Teaspoon NT Pharyngeal -- Pharyngeal- Thin Cup Penetration/Aspiration during swallow Pharyngeal Material enters airway, remains ABOVE vocal cords then ejected out Pharyngeal- Thin Straw Delayed swallow initiation-vallecula Pharyngeal -- Pharyngeal- Puree -- Pharyngeal -- Pharyngeal- Mechanical Soft Pharyngeal residue -  valleculae;Reduced tongue base retraction Pharyngeal -- Pharyngeal- Regular -- Pharyngeal -- Pharyngeal- Multi-consistency  -- Pharyngeal -- Pharyngeal- Pill -- Pharyngeal -- Pharyngeal Comment --  CHL IP CERVICAL ESOPHAGEAL PHASE 06/20/2015 Cervical Esophageal Phase WFL Pudding Teaspoon -- Pudding Cup -- Honey Teaspoon -- Honey Cup -- Nectar Teaspoon -- Nectar Cup -- Nectar Straw -- Thin Teaspoon -- Thin Cup -- Thin Straw -- Puree -- Mechanical Soft -- Regular -- Multi-consistency -- Pill -- Cervical Esophageal Comment -- No flowsheet data found. [place] Siren 06/20/2015, 11:56 AM Orbie Pyo Colvin Caroli.Ed [employer] [phone]               Microbiology: Recent Results (from the past 240 hour(s))  Urine culture     Status: None   Collection Time: 06/19/15 10:27 AM  Result Value Ref Range Status   Specimen Description URINE, CATHETERIZED  Final   Special Requests NONE  Final   Culture NO GROWTH  Final   Report Status 06/20/2015 FINAL  Final  Culture, blood (routine x 2)     Status: None (Preliminary result)   Collection Time: 06/19/15  3:50 PM  Result Value Ref Range Status   Specimen Description BLOOD LEFT HAND  Final   Special Requests BOTTLES DRAWN AEROBIC AND ANAEROBIC 10CC   Final   Culture NO GROWTH 3 DAYS  Final   Report Status PENDING  Incomplete  Culture, blood (routine x 2)     Status: None (Preliminary result)   Collection Time: 06/19/15  4:15 PM  Result Value Ref Range Status   Specimen Description BLOOD LEFT WRIST  Final   Special Requests BOTTLES DRAWN AEROBIC AND ANAEROBIC 10CC   Final   Culture NO GROWTH 3 DAYS  Final   Report Status PENDING  Incomplete  MRSA PCR Screening     Status: None   Collection Time: 06/21/15 10:17 PM  Result Value Ref Range Status   MRSA by PCR NEGATIVE NEGATIVE Final    Comment:        The GeneXpert MRSA Assay (FDA approved for NASAL specimens only), is one component of a comprehensive MRSA colonization surveillance program. It is not intended to diagnose MRSA infection nor to guide or monitor treatment for MRSA infections.      Labs: Basic  Metabolic Panel:  Recent Labs Lab 06/20/15 0417 06/21/15 0516 06/21/15 1825 06/22/15 0505 06/23/15 0244  NA 139 139 140 139 140  K 3.4* 3.9 3.7 3.9 4.3  CL 113* 113* 114* 111 111  CO2 16* 18* 19* 17* 21*  GLUCOSE 115* 95 130* 113* 117*  BUN 9 16 15 15 20   CREATININE 0.94 1.13* 1.12* 0.90 1.26*  CALCIUM 8.7* 9.1 9.1 9.0 9.6  MG  --   --  1.9 2.3 2.0  PHOS  --   --   --  3.3 3.4   Liver Function Tests:  Recent Labs Lab 06/18/15 1342 06/19/15 0519 06/23/15 0244  AST 56* 54* 59*  ALT 45 46 58*  ALKPHOS 51 49 51  BILITOT 0.7 0.6 0.7  PROT 6.3* 6.3* 6.2*  ALBUMIN 3.3* 3.2* 3.3*   No results for input(s): LIPASE, AMYLASE in the last 168 hours. No results for input(s): AMMONIA in the last 168 hours. CBC:  Recent Labs Lab 06/18/15 1342 06/18/15 1350 06/18/15 1840 06/19/15 0519 06/20/15 0417 06/22/15 0505  WBC 6.8  --   --  6.7 7.1 8.4  NEUTROABS 4.3  --   --  4.4  --   --  HGB 12.6 13.9  --  12.3 12.9 13.3  HCT 38.0 41.0 39.8 37.2 39.0 40.2  MCV 91.1  --   --  91.4 92.9 91.4  PLT 233  --   --  232 213 232   Cardiac Enzymes: No results for input(s): CKTOTAL, CKMB, CKMBINDEX, TROPONINI in the last 168 hours. BNP: BNP (last 3 results) No results for input(s): BNP in the last 8760 hours.  ProBNP (last 3 results) No results for input(s): PROBNP in the last 8760 hours.  CBG:  Recent Labs Lab 06/22/15 0820 06/22/15 1311 06/22/15 1640 06/22/15 2224 06/23/15 0843  GLUCAP 112* 111* 128* 106* 103*       Signed:  Nita Sells MD   [facility] 06/23/2015, 10:29 AM

## 2015-06-23 NOTE — Progress Notes (Signed)
Rehab Admissions Coordinator Note:  Patient was screened by Cleatrice Burke for appropriateness for an Inpatient Acute Rehab Consult per PT recommendation.  At this time, we are recommending Inpatient Rehab consult. Please place order.  Cleatrice Burke 06/23/2015, 7:28 AM  I can be reached at [phone].

## 2015-06-23 NOTE — Progress Notes (Signed)
PTAR is here, D/c instructions given to the daughter,vital signs stable.

## 2015-06-23 NOTE — Consult Note (Signed)
Physical Medicine and Rehabilitation Consult   Reason for Consult: Delirium in setting of UTI and dehydration/FTT Referring Physician: Dr. Verlon Au.    HPI: Rose Sullivan is a 82 y.o. female with history fo HTN, PAF, chronic back pain, CKD, recent admission Feb and March 2017 for L-PCA infarcts with residual right sided weakness, aphasia and cognitive deficits as well as UTI/dehydration. She was readmitted on speech 06/18/15 with recurrent UTI, poor po intake, worsening of confusion and somnolence. She was stared on IVF for gentle hydration and bactrim discontinued. She was made NPO due to delayed swallow and evidence of dysphagia. Eliquis changed to IV heparin and aspiration risk. BC X 2 negative so far. MRI brain showed slight extension with acute on chronic infarct of left occipital pole with expected evolution and stable atrophy with diffuse white matter disease. Extension of stroke felt to be due to dehydration and patient to resume Eliquis once able to swallow. Neurology felt that patient with dementia with acute delirium in setting of UTI and dehydration.  Mentation has improved and patient advanced to dysphagia 1, thin liquids yesterday.     Review of Systems  Unable to perform ROS: mental acuity      Past Medical History  Diagnosis Date  . GLUCOSE INTOLERANCE 02/11/2007  . HYPERLIPIDEMIA 02/11/2007  . BLINDNESS, [place], [place] DEFINITION 11/25/2006  . Other specified forms of hearing loss 09/05/2009  . HYPERTENSION 02/11/2007  . Atrial fibrillation ([place]) 03/07/2009  . GERD 02/11/2007  . LOW BACK PAIN, CHRONIC 11/25/2006  . BACK PAIN 12/10/2007  . Pain in Soft Tissues of Limb 02/11/2007  . OSTEOPENIA 02/11/2007  . FATIGUE 12/10/2007  . GAIT DISTURBANCE 12/10/2007  . DYSPNEA 03/18/2009  . Cough 03/07/2009  . CHEST PAIN 12/10/2007  . Abdominal pain, left lower quadrant 04/01/2007  . BREAST CANCER, HX OF 11/25/2006  . Nocturia 05/18/2010  . Hypersomnia 05/18/2010  . Impaired  glucose tolerance 05/18/2010  . Allergic rhinitis, cause unspecified 07/23/2012  . Anxiety state, unspecified 02/04/2013  . Cancer ([place])   . Renal insufficiency 06/18/2015    Past Surgical History  Procedure Laterality Date  . Dilation and curettage of uterus    . S/p mastectomy Right   . Appendectomy    . Cataract extraction    . Knee surgery Right     Family History  Problem Relation Age of Onset  . Hypertension Brother   . Lung cancer Brother   . Colon cancer Father   . COPD Brother   . Stroke Mother   . Colon cancer Father     Social History:  reports that she has never smoked. She has never used smokeless tobacco. She reports that she does not drink alcohol or use illicit drugs.    Allergies  Allergen Reactions  . Trazodone And Nefazodone Other (See Comments)    hallucinations    Medications Prior to Admission  Medication Sig Dispense Refill  . Diclofenac Sodium 1.5 % SOLN Place 2 mLs onto the skin 3 (three) times daily. 3 Bottle 3  . diltiazem (CARDIZEM CD) 240 MG 24 hr capsule Take 1 capsule (240 mg total) by mouth daily. 30 capsule 6  . ELIQUIS 5 MG TABS tablet TAKE 1 TABLET BY MOUTH TWICE A DAY 60 tablet 0  . exemestane (AROMASIN) 25 MG tablet TAKE 1 TABLET BY MOUTH EVERY DAY 30 tablet 1  . metoprolol tartrate (LOPRESSOR) 25 MG tablet Take 0.5 tablets (12.5 mg total) by mouth 2 (two) times  daily. 60 tablet 1  . oxyCODONE-acetaminophen (PERCOCET) 7.5-325 MG tablet Take 1 tablet by mouth every 8 (eight) hours as needed. 120 tablet 0  . Pramoxine-Camphor-Zinc Acetate (ANTI ITCH EX) Apply 1 application topically as needed (for itching).    . pravastatin (PRAVACHOL) 40 MG tablet TAKE 1 TABLET BY MOUTH EVERY DAY AT 6 PM 30 tablet 0  . sulfamethoxazole-trimethoprim (BACTRIM DS,SEPTRA DS) 800-160 MG tablet Take 1 tablet by mouth 2 (two) times daily. 20 tablet 0    Home: Home Living Family/patient expects to be discharged to:: Private residence Living Arrangements:  Children Available Help at Discharge: Family, Available 24 hours/day Type of Home: House Home Access: Ramped entrance Home Layout: One level Bathroom Shower/Tub: Tub/shower unit Home Equipment: Wheelchair - manual, Environmental consultant - 2 wheels  Functional History: Prior Function Level of Independence: Needs assistance Gait / Transfers Assistance Needed: Does short distance ambulation with Min A and Rw. Getting HHPT, OT ADL's / Homemaking Assistance Needed: Assist with dressing/bathing from daughter Functional Status:  Mobility: Bed Mobility Overal bed mobility: Needs Assistance Bed Mobility: Supine to Sit Rolling: Min assist Sidelying to sit: Mod assist, HOB elevated Supine to sit: Min assist, HOB elevated General bed mobility comments: Cues for hand placement to utilize rail.  Assist managing LEs and using bed pad for positioning.  Cues for sequencing. Transfers Overall transfer level: Needs assistance Equipment used: Rolling walker (2 wheeled) Transfers: Sit to/from Stand, [employer] Transfers Sit to Stand: Min assist, Mod assist Stand pivot transfers: Mod assist General transfer comment: Initial sit<>stand and stand pivot performed w/ this PT, pt required min assist w/ max verbal and tactile cues for hand placement and technique.  Noted to have small BM.  Daughter main assist for pivot to [facility] from chair w/ this PT providing min assist to steady.  Cues provided to the daughter for technique during pivot.  Key points: 1 hand on gait belt, one hand on RW to assist w/ management, cue pt to stand upright, count to 3 to stand. Ambulation/Gait Ambulation/Gait assistance: Mod assist Ambulation Distance (Feet): 10 Feet Assistive device: Rolling walker (2 wheeled) Gait Pattern/deviations: Step-to pattern, Shuffle, Trunk flexed General Gait Details: Pt too fatigued after 3 stand pivot transfers, will attempt to progress at next session. Gait velocity: decreased    ADL:     Cognition: Cognition Overall Cognitive Status: Difficult to assess Orientation Level: Oriented to person Cognition Arousal/Alertness: Awake/alert Behavior During Therapy: Flat affect Overall Cognitive Status: Difficult to assess Difficult to assess due to: Impaired communication   Blood pressure 142/69, pulse 79, temperature 97.5 F (36.4 C), temperature source Axillary, resp. rate 15, height 5\' 1"  (1.549 m), weight 80.74 kg (178 lb), SpO2 100 %. Physical Exam  Constitutional: She appears well-developed.  HENT:  Head: Normocephalic.  Cardiovascular: Normal rate.   Respiratory: Effort normal.  GI: Soft.  Neurological:  Slow to awaken. Moves all fours.     Results for orders placed or performed during the hospital encounter of 06/18/15 (from the past 24 hour(s))  Glucose, capillary     Status: Abnormal   Collection Time: 06/22/15  8:20 AM  Result Value Ref Range   Glucose-Capillary 112 (H) 65 - 99 mg/dL  Glucose, capillary     Status: Abnormal   Collection Time: 06/22/15  1:11 PM  Result Value Ref Range   Glucose-Capillary 111 (H) 65 - 99 mg/dL  Glucose, capillary     Status: Abnormal   Collection Time: 06/22/15  4:40 PM  Result Value  Ref Range   Glucose-Capillary 128 (H) 65 - 99 mg/dL  Glucose, capillary     Status: Abnormal   Collection Time: 06/22/15 10:24 PM  Result Value Ref Range   Glucose-Capillary 106 (H) 65 - 99 mg/dL  Magnesium     Status: None   Collection Time: 06/23/15  2:44 AM  Result Value Ref Range   Magnesium 2.0 1.7 - 2.4 mg/dL  Phosphorus     Status: None   Collection Time: 06/23/15  2:44 AM  Result Value Ref Range   Phosphorus 3.4 2.5 - 4.6 mg/dL  Comprehensive metabolic panel     Status: Abnormal   Collection Time: 06/23/15  2:44 AM  Result Value Ref Range   Sodium 140 135 - 145 mmol/L   Potassium 4.3 3.5 - 5.1 mmol/L   Chloride 111 101 - 111 mmol/L   CO2 21 (L) 22 - 32 mmol/L   Glucose, Bld 117 (H) 65 - 99 mg/dL   BUN 20 6 - 20 mg/dL    Creatinine, Ser 1.26 (H) 0.44 - 1.00 mg/dL   Calcium 9.6 8.9 - 10.3 mg/dL   Total Protein 6.2 (L) 6.5 - 8.1 g/dL   Albumin 3.3 (L) 3.5 - 5.0 g/dL   AST 59 (H) 15 - 41 U/L   ALT 58 (H) 14 - 54 U/L   Alkaline Phosphatase 51 38 - 126 U/L   Total Bilirubin 0.7 0.3 - 1.2 mg/dL   GFR calc non Af Amer 39 (L) >60 mL/min   GFR calc Af Amer 45 (L) >60 mL/min   Anion gap 8 5 - 15   No results found.  Assessment/Plan: Diagnosis: debility/CVA  Pt well known to me. Had some confusion last night but seems to have perked up this morning. Daughter wants to bring patient home and seems comfortable with required care. Resume HH services. She will follow up with [place] in the office.   Meredith Staggers, MD, [facility] 06/23/2015  06/23/2015

## 2015-06-23 NOTE — Progress Notes (Signed)
Physical Therapy Treatment Patient Details Name: Rose Sullivan MRN: [id number] DOB: 03/16/1933 Today's Date: 06/23/2015    History of Present Illness Patient is a 82 y/o female with hx of CVA, DM, HTN, A-fib, breast ca and anxiety presents with AMS. MRI showed extension of left occipital infarct.     PT Comments    On arrival, informed by daughter that she now wants to take patient home and resume HHPT. She would prefer if patient can pivot to [employer] chair with 1 person assist, however can have family provide a second person or take care of patient at bed level with bedpan. Daughter was able to demonstrate getting pt out of and back into bed without physical assist of PT. Daughter was able to pivot patient to/from [facility] with closeguard by PT (again no physical assist). Discussed need for ambulance transport as pt very fatigued after 2 transfers and car transfers are more difficult and would require pt to step while standing (she cannot yet do safely with daughter). Daughter open to ambulance transport and [place], RN CM made aware.    Follow Up Recommendations  Supervision/Assistance - 24 hour;Supervision for mobility/OOB;Home health PT (Daughter present and reports she will provide 24/7 w/ family)     Equipment Recommendations  None recommended by PT   (discussed ? Hoyer lift and daughter is not interested)   Recommendations for Other Services       Precautions / Restrictions Precautions Precautions: Fall Precaution Comments: legally blind Restrictions Weight Bearing Restrictions: No    Mobility  Bed Mobility Overal bed mobility: Needs Assistance Bed Mobility: Supine to Sit;Sit to Supine Rolling: Min assist Sidelying to sit: Min assist;HOB elevated   Sit to supine: Mod assist   General bed mobility comments: Daughter able to provide all assist for getting pt out of and into bed; no cues needed to improve safety or technique.   Transfers Overall transfer level:  Needs assistance Equipment used: Rolling walker (2 wheeled);1 person hand held assist Transfers: Sit to/from [facility] Sit to Stand: Min assist Stand pivot transfers: Min assist       General transfer comment: Daughter able to safely setup transfer to [facility], appropriately cue pt; and assist to pivot. Pt stood from [facility] min assist and daughter able to maintain her balance while performing pericare. Daughter then assisted pt to pivot to bed with RW (required incr assist due to pivot toward weaker leg  Ambulation/Gait Ambulation/Gait assistance: Mod assist;+2 physical assistance Ambulation Distance (Feet): 2 Feet Assistive device: Rolling walker (2 wheeled) Gait Pattern/deviations: Step-to pattern;Decreased stride length;Shuffle;Trunk flexed     General Gait Details: two feet forward up to [facility] prior to sitting; PT had to assist advancing RLE   Stairs Stairs:  (have ramp; transport chair)          Wheelchair Mobility    Modified Rankin (Stroke Patients Only) Modified Rankin (Stroke Patients Only) Pre-Morbid Rankin Score: Moderately severe disability Modified Rankin: Moderately severe disability     Balance Overall balance assessment: Needs assistance Sitting-balance support: No upper extremity supported;Feet supported Sitting balance-Leahy Scale: Poor Sitting balance - Comments: Pt has poor safety awareness and attempts to stand multiple times before environment set up safely.  Emphasized to daughter that pt will need to have 24/7 supervision due to increased risk for falls. Postural control: Posterior lean Standing balance support: Bilateral upper extremity supported Standing balance-Leahy Scale: Poor Standing balance comment: stood x 3 minutes for pericare  Cognition Arousal/Alertness: Awake/alert Behavior During Therapy: Flat affect Overall Cognitive Status: Difficult to assess                      Exercises       General Comments        Pertinent Vitals/Pain VSS per monitor (in stepdown unit)   Pain Assessment: Faces Faces Pain Scale: No hurt    Home Living               Home Equipment: Wheelchair - manual;Walker - 2 wheels;Bedside commode;Grab bars - toilet;Transport chair (adjustable bed with small rail (dtr added))      Prior Function            PT Goals (current goals can now be found in the care plan section) Acute Rehab PT Goals Patient Stated Goal: none stated but per daughter to go home PT Goal Formulation: With patient/family Time For Goal Achievement: 07/05/15 Potential to Achieve Goals: Good Progress towards PT goals: Progressing toward goals    Frequency  Min 4X/week    PT Plan Frequency needs to be updated;Discharge plan needs to be updated    Co-evaluation             End of Session Equipment Utilized During Treatment: Gait belt Activity Tolerance: Patient limited by fatigue Patient left: with call bell/phone within reach;with family/visitor present;in bed;Other (comment) (Case Manager present)     Time: [id number] PT Time Calculation (min) (ACUTE ONLY): 39 min  Charges:  $Therapeutic Activity: 38-52 mins                    G Codes:      [id number] 07/10/2015, 11:46 AM  Pager [phone]

## 2015-06-23 NOTE — Care Management Note (Signed)
Case Management Note  Patient Details  Name: Rose Sullivan MRN: [id number] Date of Birth: February 05, 1933  Subjective/Objective:  Pt to discharge home with dtr, other family members will be available for support as well.  Pt is active with [facility] and dtr would like for them to resume services.  Pt has w/c, transport chair, walker, BSC and handicapped BR equipment.                               Expected Discharge Plan:  [place]  Discharge planning Services  CM Consult  Post Acute Care Choice:  Resumption of Svcs/PTA Provider  DME Agency:  [facility] Arranged:  RN, PT, OT, Nurse's Aide, Speech Therapy, Social Work [employer] Agency:  [facility]  Status of Service:  Completed, signed off  Medicare Important Message Given:  Yes  Girard Cooter, RN 06/23/2015, 12:03 PM

## 2015-06-24 ENCOUNTER — Telehealth: Payer: Self-pay

## 2015-06-24 ENCOUNTER — Telehealth: Payer: Self-pay | Admitting: Internal Medicine

## 2015-06-24 LAB — CULTURE, BLOOD (ROUTINE X 2)
Culture: NO GROWTH
Culture: NO GROWTH

## 2015-06-24 NOTE — Telephone Encounter (Signed)
Medication faxed to pharmacy 

## 2015-06-24 NOTE — Telephone Encounter (Signed)
Transition Care Management Follow-up Telephone Call  SPOKE TO PT DTR (HCPOA)   Date discharged? 06/23/2015  How have you been since you were released from the hospital? weakness and confusion. While admitted pt had suffered a stroke and went into Afib.   Do you understand why you were in the hospital? YES  Do you understand the discharge instructions? YES  Where were you discharged to? Home  Items Reviewed:  Medications reviewed: Yes  Allergies reviewed: Yes  Dietary changes reviewed: Yes  Referrals reviewed: Yes  Functional Questionnaire:   Activities of Daily Living (ADLs):   States they are independent in the following: None States they require assistance with the following: All ADL's - Pt suffered another stroke.   Any transportation issues/concerns? No - dtr will provide  Any patient concerns? Just getting back to "normal"   Confirmed importance and date/time of follow-up visits scheduled:   Provider Appointment booked:   Confirmed with patient if condition begins to worsen call PCP or go to the ER.  Patient was given the office number and encouraged to call back with question or concerns: Pt dtr stated understanding. Pt dtr will call back and make an appt after appt early.

## 2015-06-24 NOTE — Telephone Encounter (Signed)
Ok for verbal 

## 2015-06-24 NOTE — Telephone Encounter (Signed)
Patient is back home from hospital.  Wants to request verbal orders for speech therapy for 3x a week for two weeks initially.  Then reases.  This is address swallowing and speech.

## 2015-06-24 NOTE — Telephone Encounter (Signed)
Please advise 

## 2015-06-24 NOTE — Telephone Encounter (Signed)
Done hardcopy to Corinne  

## 2015-06-26 ENCOUNTER — Encounter: Payer: Self-pay | Admitting: Physician Assistant

## 2015-06-26 NOTE — Progress Notes (Signed)
Cardiology Office Note    Date:  06/28/2015  ID:  [name], DOB 31-Aug-1933, MRN [id number] PCP:  [name], MD  Cardiologist:  Aundra Dubin remotely   Chief Complaint: f/u atrial fib  History of Present Illness:  Rose Sullivan is a 82 y.o. female with history of paroxysmal atrial fib, atrial flutter s/p remote ablation, recurrent SVT, breast CA, HTN (ACEI cough), DM, GERD, HLD, legally blind, obesity, chronic dyspnea/deconditioning with arthritis, stroke with cerebrovascular disease, CKD stage II-III per labs (cr 0.9-1.4). She presents for post-hospital follow-up.   Per review of chart, Lexiscan myoview (2/11): EF 74%, no ischemia or infarction.  She had a stroke in 03/2015 while on Eliquis - 2D echo 03/13/16: mild focal basal hypertrophy of septum, EF 55-60%, no RWMA, mild MR. She was readmitted 03/2015 for extension of stroke felt 2/2 hypotension from UTI. More recently she was admitted 05/2015 with confusion, weakness and found to have encephalopathy secondary to left occipital stroke extension (per neuro note, Not the pattern of afib cardioembolic infarct - likely associated with hypotension, dehydration, UTI in the setting of chronic left P1 occlusion). She went into AF RVR during admission and was started on amiodarone. Recent TSH wnl and CMET with mild elevation of LFTS (AST 59, ALT 58), Hgb normal. Prognosis per hospitalist note is felt to be poor ("recommend PCP address this holistically if pt develops adult failure to thrive"). Dose of amio was to be 400mg  daily x 7 days then transition to 200mg  daily.   She comes in today for follow-up with her daughter who is her POA. The patient remains essentially nonverbal ever since her last stroke due to aphasia. She is able to understand and follow directions. There have been no indications of CP, dyspnea, or palpitations. Her daughter noticed some redness on her back today, ill defined. She wonders if it is a med reaction.   Past Medical  History  Diagnosis Date  . HYPERLIPIDEMIA   . BLINDNESS, [place], [place] DEFINITION 11/25/2006  . Other specified forms of hearing loss 09/05/2009  . HYPERTENSION   . PAF (paroxysmal atrial fibrillation) (Rose Sullivan)     a. started on amio 05/2015 by IM.  Marland Kitchen GERD 02/11/2007  . LOW BACK PAIN, CHRONIC 11/25/2006  . BACK PAIN 12/10/2007  . Pain in Soft Tissues of Limb 02/11/2007  . OSTEOPENIA 02/11/2007  . GAIT DISTURBANCE 12/10/2007  . Abdominal pain, left lower quadrant 04/01/2007  . BREAST CANCER, HX OF 11/25/2006  . Nocturia 05/18/2010  . Hypersomnia 05/18/2010  . Impaired glucose tolerance     a. hx of diet controlled DM per chart.  . Allergic rhinitis, cause unspecified 07/23/2012  . Anxiety state, unspecified 02/04/2013  . Cancer ([place])   . CKD (chronic kidney disease), stage II     CKD II-III per labs  . Atrial flutter ([place])     a. s/p remote ablation.  . SVT (supraventricular tachycardia) ([place])   . Obesity   . Shortness of breath   . Stroke [facility])     a. Stroke 03/2015 with readmission 03/2015 for extension of stroke felt 2/2 hypotension from UTI.  b. Adm 05/2015 for encephalopathy encephalopathy secondary to left occipital stroke extension (not afib cardioembolic pattern, more likely related to hypotension from dehydration/UTI).    Past Surgical History  Procedure Laterality Date  . Dilation and curettage of uterus    . S/p mastectomy Right   . Appendectomy    . Cataract extraction    . Knee surgery  Right     Current Medications: Outpatient Prescriptions Prior to Visit  Medication Sig Dispense Refill  . amiodarone (PACERONE) 200 MG tablet Take 1 tablet (200 mg total) by mouth daily. 14 tablet 0  . amiodarone (PACERONE) 400 MG tablet Take 1 tablet (400 mg total) by mouth daily. 8 tablet 0  . apixaban (ELIQUIS) 5 MG TABS tablet Take 1 tablet (5 mg total) by mouth 2 (two) times daily. 60 tablet 0  . Diclofenac Sodium 1.5 % SOLN Place 2 mLs onto the skin 3 (three) times daily. 3 Bottle 3  .  exemestane (AROMASIN) 25 MG tablet TAKE 1 TABLET BY MOUTH EVERY DAY 30 tablet 1  . magnesium oxide (MAG-OX) 400 MG tablet Take 1 tablet (400 mg total) by mouth daily. 15 tablet 0  . metoprolol tartrate (LOPRESSOR) 25 MG tablet Take 0.5 tablets (12.5 mg total) by mouth 2 (two) times daily. 60 tablet 1  . oxyCODONE-acetaminophen (PERCOCET) 7.5-325 MG tablet Take 1 tablet by mouth every 8 (eight) hours as needed. 120 tablet 0  . Pramoxine-Camphor-Zinc Acetate (ANTI ITCH EX) Apply 1 application topically as needed (for itching).    . pravastatin (PRAVACHOL) 40 MG tablet TAKE 1 TABLET BY MOUTH EVERY DAY AT 6 PM 30 tablet 0  . temazepam (RESTORIL) 30 MG capsule TAKE ONE CAPSULE BY MOUTH AT BEDTIME AS NEEDED FOR SLEEP 90 capsule 1  . diclofenac sodium (VOLTAREN) 1 % GEL APPLY 2 GRAMS TO THE AFFECTED AREA 4 TIMES A DAY 100 g 1   No facility-administered medications prior to visit.     Allergies:   Trazodone and nefazodone   Social History   Social History  . Marital Status: Widowed    Spouse Name: N/A  . Number of Children: 1  . Years of Education: N/A   Occupational History  .     Social History Main Topics  . Smoking status: Never Smoker   . Smokeless tobacco: Never Used     Comment: quit 40 years ago  . Alcohol Use: No  . Drug Use: No  . Sexual Activity: Not Asked   Other Topics Concern  . None   Social History Narrative   Lives with daughter     Family History:  The patient's family history includes COPD in her brother; Colon cancer in her father and father; Hypertension in her brother; Lung cancer in her brother; Stroke in her mother.   ROS:   Please see the history of present illness.  All other systems are reviewed and otherwise negative.    PHYSICAL EXAM:   VS:  BP 150/84 mmHg  Pulse 71  Ht 5' (1.524 m)  Wt 165 lb 1.9 oz (74.898 kg)  BMI 32.25 kg/m2  BMI: Body mass index is 32.25 kg/(m^2). GEN: Well nourished chronically ill appearing WF in no acute  distress HEENT: normocephalic, atraumatic, divergence of eyes Neck: no JVD or masses Cardiac: RRR; no murmurs, rubs, or gallops, no edema  Respiratory:  clear to auscultation bilaterally, normal work of breathing GI: soft, nontender, nondistended, + BS MS: no deformity or atrophy Skin: warm and dry - diffuse mild spotty redness on patient's back but no specific papules, macules or petechiae Neuro:  Essentially nonverbal but follow commands  Wt Readings from Last 3 Encounters:  06/28/15 165 lb 1.9 oz (74.898 kg)  06/22/15 178 lb (80.74 kg)  06/14/15 165 lb (74.844 kg)      Studies/Labs Reviewed:   EKG:  EKG was ordered today and personally  reviewed by me. The EKG ordered today demonstrates NSR 71bpm no acute changes  Recent Labs: 06/19/2015: TSH 2.153 06/22/2015: Hemoglobin 13.3; Platelets 232 06/23/2015: ALT 58*; BUN 20; Creatinine, Ser 1.26*; Magnesium 2.0; Potassium 4.3; Sodium 140   Lipid Panel    Component Value Date/Time   CHOL 109 06/20/2015 0417   TRIG 84 06/20/2015 0417   TRIG 134 12/18/2005 1533   HDL 33* 06/20/2015 0417   CHOLHDL 3.3 06/20/2015 0417   CHOLHDL 4.4 CALC 12/18/2005 1533   VLDL 17 06/20/2015 0417   LDLCALC 59 06/20/2015 0417    Additional studies/ records that were reviewed today include: Summarized above.    ASSESSMENT & PLAN:   1. Paroxysmal atrial fibrillation - agree with need for amio at present time given her issue of recurrent stroke in the setting of hypotension. (Higher rates of AF RVR could precipitate hypotension and start the cycle all over again.) She is due to decrease amiodarone to 200mg  daily on 06/30/15. Given mildly elevated LFTs during recent hospital stay, will recheck since being on amiodarone. Her daughter and I discussed the potential risks versus benefits of the medication. Further monitoring of organ systems while on amiodarone will be at discretion of primary cardiologist. Will also check magnesium level since this was started  in the hospital.  2. Rash - on back, non-descript, does not look like typical drug rash. ? Heat/moisture related from sedentary position. I asked her daughter to try Claritin 10mg  daily PRN and call if no improvement. The only new recent meds are amiodarone and magnesium. I did not advise benadryl because I was concerned if she becomes lethargic we will not be able to tell if it's from the medicine or a new event. 3. Essential HTN - BP running slightly higher in clinic today but since her stroke episodes have been precipitated by hypotension I will not make any med changes today. 4. Recurrent stroke - see above. Continue Eliquis and hydration as tolerated. 5. CKD II-III - follow-up per primary care.  Disposition: F/u with Dr. Aundra Dubin 3-4 months.   Medication Adjustments/Labs and Tests Ordered: Current medicines are reviewed at length with the patient today.  Concerns regarding medicines are outlined above. Medication changes, Labs and Tests ordered today are listed in the Patient Instructions below.   Raechel Ache PA-C  06/28/2015 3:36 PM    [facility] [address], [place], [place]  [id number] Phone: [phone]; Fax: [phone]

## 2015-06-28 ENCOUNTER — Ambulatory Visit (INDEPENDENT_AMBULATORY_CARE_PROVIDER_SITE_OTHER): Payer: Medicare Other | Admitting: Physician Assistant

## 2015-06-28 ENCOUNTER — Ambulatory Visit: Payer: Medicare Other | Admitting: Physician Assistant

## 2015-06-28 ENCOUNTER — Encounter: Payer: Self-pay | Admitting: Physician Assistant

## 2015-06-28 VITALS — BP 150/84 | HR 71 | Ht 60.0 in | Wt 165.1 lb

## 2015-06-28 DIAGNOSIS — I10 Essential (primary) hypertension: Secondary | ICD-10-CM | POA: Diagnosis not present

## 2015-06-28 DIAGNOSIS — N182 Chronic kidney disease, stage 2 (mild): Secondary | ICD-10-CM | POA: Diagnosis not present

## 2015-06-28 DIAGNOSIS — Z8673 Personal history of transient ischemic attack (TIA), and cerebral infarction without residual deficits: Secondary | ICD-10-CM

## 2015-06-28 DIAGNOSIS — R21 Rash and other nonspecific skin eruption: Secondary | ICD-10-CM

## 2015-06-28 DIAGNOSIS — I480 Paroxysmal atrial fibrillation: Secondary | ICD-10-CM | POA: Diagnosis not present

## 2015-06-28 DIAGNOSIS — I639 Cerebral infarction, unspecified: Secondary | ICD-10-CM

## 2015-06-28 NOTE — Patient Instructions (Signed)
Medication Instructions:  Your physician recommends that you continue on your current medications as directed. Please refer to the Current Medication list given to you today.   Labwork: Your physician recommends that you have lab work today: cmet/mag   Testing/Procedures: -None  Follow-Up: Your physician recommends that you keep your scheduled  follow-up appointment with Dr. Aundra Dubin   Any Other Special Instructions Will Be Listed Below (If Applicable).  May try claritin (10 mg ) daily for rash.  This medication is over the counter if no resolution call [phone].   If you need a refill on your cardiac medications before your next appointment, please call your pharmacy.

## 2015-06-29 ENCOUNTER — Telehealth: Payer: Self-pay | Admitting: Internal Medicine

## 2015-06-29 ENCOUNTER — Telehealth: Payer: Self-pay | Admitting: Physician Assistant

## 2015-06-29 LAB — COMPREHENSIVE METABOLIC PANEL
ALBUMIN: 3.9 g/dL (ref 3.6–5.1)
ALK PHOS: 53 U/L (ref 33–130)
ALT: 41 U/L — ABNORMAL HIGH (ref 6–29)
AST: 41 U/L — ABNORMAL HIGH (ref 10–35)
BILIRUBIN TOTAL: 1.1 mg/dL (ref 0.2–1.2)
BUN: 22 mg/dL (ref 7–25)
CALCIUM: 8.9 mg/dL (ref 8.6–10.4)
CHLORIDE: 106 mmol/L (ref 98–110)
CO2: 21 mmol/L (ref 20–31)
CREATININE: 1.14 mg/dL — AB (ref 0.60–0.88)
Glucose, Bld: 97 mg/dL (ref 65–99)
Potassium: 4.3 mmol/L (ref 3.5–5.3)
SODIUM: 139 mmol/L (ref 135–146)
TOTAL PROTEIN: 6.8 g/dL (ref 6.1–8.1)

## 2015-06-29 LAB — MAGNESIUM: MAGNESIUM: 2.4 mg/dL (ref 1.5–2.5)

## 2015-06-29 NOTE — Telephone Encounter (Signed)
Is requesting verbal order for 2 x a week for 6 weeks for OT.

## 2015-06-29 NOTE — Telephone Encounter (Signed)
Spoke with daughter, [name], she has been made aware of pts lab results.  She will d/c the magox.

## 2015-06-29 NOTE — Telephone Encounter (Signed)
New message ° ° ° ° °Returning a call to the nurse to get lab results °

## 2015-06-30 ENCOUNTER — Telehealth: Payer: Self-pay

## 2015-06-30 NOTE — Telephone Encounter (Signed)
Called to speak to [facility], unable to reach left message to give me a call back at my number.

## 2015-06-30 NOTE — Telephone Encounter (Signed)
Traci was wanting to know if Dr. Jenny Reichmann wanted the patient to start taking the medication for utis again. She has not been taking it since she left the hospital. Also wanted to know if the patient could start taken MEGACE again. Please Call Traci if you have any questions.

## 2015-06-30 NOTE — Telephone Encounter (Signed)
Rose Sullivan called back, spoke to her and gave her the verbal.

## 2015-06-30 NOTE — Telephone Encounter (Signed)
Ok for verbal 

## 2015-06-30 NOTE — Telephone Encounter (Signed)
Ok to hold off on these for now., thanks

## 2015-06-30 NOTE — Telephone Encounter (Signed)
Please advise Diane from home health is requesting OT twice a week for 6 weeks. [phone]

## 2015-06-30 NOTE — Telephone Encounter (Signed)
Please advise 

## 2015-06-30 NOTE — Telephone Encounter (Signed)
Verbal given 

## 2015-07-01 ENCOUNTER — Telehealth: Payer: Self-pay | Admitting: Internal Medicine

## 2015-07-01 NOTE — Telephone Encounter (Signed)
Pt's daughter called request to speak to assistant Adelina Mings only) concern about Ceftriaxone for her UTI. Please call her back before your go home today, she has a question.   # [phone]

## 2015-07-01 NOTE — Telephone Encounter (Signed)
Called and left message for tracy to give [place] a call back.

## 2015-07-04 NOTE — Telephone Encounter (Signed)
Spoke to [place] on Friday and advised that an appt (hospital follow) would be the best option due to the change in pt condition and medication.

## 2015-07-04 NOTE — Telephone Encounter (Signed)
Handled in phone note from 06/30/15

## 2015-07-05 ENCOUNTER — Telehealth: Payer: Self-pay

## 2015-07-05 ENCOUNTER — Telehealth: Payer: Self-pay | Admitting: *Deleted

## 2015-07-05 NOTE — Telephone Encounter (Signed)
She was dehydrated and had UTI when admitted. She can get that checked at PCP.

## 2015-07-05 NOTE — Telephone Encounter (Signed)
LFt vm for patient to give her Hoyle Sauer advice. PT was seen at [facility] by Carolyn(NP) for first hospital follow up. Pt has another appt with Carolyn(NP) in July 2017. Per appt notes pt has appt on 07/14/2015 with Dr. Erlinda Hong.

## 2015-07-05 NOTE — Telephone Encounter (Signed)
Rose Sullivan with [facility]- needs orders for a modified barium swallow to follow up on her swallowing. She is requesting to just get verbal orders for now and she will send a fax for signatures since it is faster this way.

## 2015-07-05 NOTE — Telephone Encounter (Signed)
I can see her next week to figure out what is going on. If no acute issue, she can continue to follow up with the issue with PCP and continue to follow up with Hoyle Sauer. Thanks.   Rosalin Hawking, MD PhD Stroke Neurology 07/05/2015 4:37 PM

## 2015-07-06 ENCOUNTER — Telehealth: Payer: Self-pay

## 2015-07-06 NOTE — Telephone Encounter (Signed)
Patient has no change in getting any better. Not eating, drinking, or no out put. Home health really thinks its times for palliative care. But seems family isn't ready yet. If you want to try anything IV fluids or anything first.  Olivia Mackie [phone] Please follow up if you have any questions.

## 2015-07-06 NOTE — Telephone Encounter (Signed)
Please proceed.

## 2015-07-07 NOTE — Telephone Encounter (Signed)
i have talked with tracy/home health---she feels palliative care is needed for this patient---labs were stable when patient was released from hospital on 5/30---neurology notes do not have any further testing needed---not sure why patient is lethargic acting----only new med started is amiodorone----routing to dr john---can you give order for palliative care?---also advised tracy to let daughter know that patient may need to be taken to hospital or come in for appt with another provider here in our office if she is not comfortable with waiting for dr Gwynn Burly return on Tuesday to get this phone note----dr Jenny Reichmann, please advise if you are ok with palliative care order, i will call tracy/home health back, thanks

## 2015-07-07 NOTE — Telephone Encounter (Signed)
[place] for palliative care, ok for verbal if this is ok

## 2015-07-07 NOTE — Telephone Encounter (Signed)
Please advise in dr john's absence, thanks 

## 2015-07-07 NOTE — Telephone Encounter (Signed)
I do not know this patient so it is difficult to give advice.  It sounds like she needs to be evaluated - why is she not eating/drinking ?  She needs IVF but needs further evaluation.  If she is at home she probably needs to go to the hospital for evaluation - rule out an infection, given fluids, etc.

## 2015-07-07 NOTE — Telephone Encounter (Signed)
Home nurse called again about this patient. Can someone please follow up?

## 2015-07-08 NOTE — Telephone Encounter (Signed)
Called tracy/home health and advised ok for palliative care per dr Gwynn Burly note

## 2015-07-11 ENCOUNTER — Other Ambulatory Visit (HOSPITAL_COMMUNITY): Payer: Self-pay | Admitting: Physical Medicine & Rehabilitation

## 2015-07-11 DIAGNOSIS — R1310 Dysphagia, unspecified: Secondary | ICD-10-CM

## 2015-07-13 ENCOUNTER — Telehealth: Payer: Self-pay

## 2015-07-13 ENCOUNTER — Telehealth: Payer: Self-pay | Admitting: Neurology

## 2015-07-13 ENCOUNTER — Other Ambulatory Visit: Payer: Self-pay | Admitting: Internal Medicine

## 2015-07-13 NOTE — Telephone Encounter (Addendum)
Rn call patients daughter Rose Sullivan about her moms appt tomorrow. Rose Sullivan stated can she come in instead of her mom, and speak with Dr. Erlinda Hong. Rn stated [facility] does not allow family members to come in place of a patient. Rn explain pt would have to attend the appt tomorrow. Rose Sullivan stated palliative care is coming tomorrow to assess her mom . Rn explain there is a 50.00 no show and cancellation fee when a pt cancels day of before the appt. Rn stated if her mom was admitted to the hospital on appt today, Angie in billing can discuss that with her. Rose Sullivan stated her mom will not at times and is sleep and not arousable at times.[name] stated its an emergency that she cannot bring her mom tomorrow.Pts daughter stated she did not want to take her to the ED. Rose Sullivan stated the pts PCP stated she need to consider taking her to the ED, because pt is not having much intake and output. Rose Sullivan stated to not cancel the appt for tomorrow. Rn reminded her again of the [id number] cancellation policy for no show and less than 24 hours notice.

## 2015-07-13 NOTE — Telephone Encounter (Signed)
Tracy/[facility] [phone] called said pt has been hard to arouse lately, very sleepy, can follow commands at times. It is an extreme effort for the daughter to take the pt to the Dr at times. The daughter is wondering if Dr Erlinda Hong could call her instead of her bringing the pt, the daughter really does not think she will be able to get the pt to the clinic tomorrow.. Palliative care is coming tomorrow, Olivia Mackie thinks the pt is ready for hospice.

## 2015-07-13 NOTE — Telephone Encounter (Signed)
Rn call Olivia Mackie at [facility]. Rn stated per Dr. Erlinda Hong pt would have to come in for visit. We cannot do a visit over the telephone. Also tracy thinks pt is more hospice appropriate.

## 2015-07-14 ENCOUNTER — Encounter: Payer: Self-pay | Admitting: Neurology

## 2015-07-14 ENCOUNTER — Ambulatory Visit (INDEPENDENT_AMBULATORY_CARE_PROVIDER_SITE_OTHER): Payer: Medicare Other | Admitting: Neurology

## 2015-07-14 VITALS — BP 134/89 | HR 66 | Ht 60.0 in

## 2015-07-14 DIAGNOSIS — I480 Paroxysmal atrial fibrillation: Secondary | ICD-10-CM | POA: Diagnosis not present

## 2015-07-14 DIAGNOSIS — I639 Cerebral infarction, unspecified: Secondary | ICD-10-CM

## 2015-07-14 DIAGNOSIS — E785 Hyperlipidemia, unspecified: Secondary | ICD-10-CM

## 2015-07-14 DIAGNOSIS — E1159 Type 2 diabetes mellitus with other circulatory complications: Secondary | ICD-10-CM

## 2015-07-14 DIAGNOSIS — I63532 Cerebral infarction due to unspecified occlusion or stenosis of left posterior cerebral artery: Secondary | ICD-10-CM

## 2015-07-14 DIAGNOSIS — I10 Essential (primary) hypertension: Secondary | ICD-10-CM | POA: Diagnosis not present

## 2015-07-14 MED ORDER — AMANTADINE HCL 50 MG/5ML PO SYRP: ORAL_SOLUTION | ORAL | Status: DC

## 2015-07-14 NOTE — Patient Instructions (Addendum)
-   continue eliquis and pravastatin for stroke prevention - will give amantadine for arousal during the day - use restoril at night to help sleep - correct sleep wake cycle, more activity during the day, keep quite time at night - Follow up with your primary care physician for stroke risk factor modification. Recommend maintain blood pressure goal <130/80, diabetes with hemoglobin A1c goal below 6.5% and lipids with LDL cholesterol goal below 70 mg/dL.  - drink more fluid and avoid UTI. Discuss with PCP regarding UTI prophylaxis. - follow up with cardiology and oncology as scheduled - continue home PT/OT/speech - follow up in 2 months.

## 2015-07-15 ENCOUNTER — Telehealth: Payer: Self-pay

## 2015-07-15 ENCOUNTER — Other Ambulatory Visit: Payer: Self-pay | Admitting: *Deleted

## 2015-07-15 DIAGNOSIS — E1159 Type 2 diabetes mellitus with other circulatory complications: Secondary | ICD-10-CM | POA: Insufficient documentation

## 2015-07-15 MED ORDER — AMIODARONE HCL 200 MG PO TABS: 200.0000 mg | ORAL_TABLET | Freq: Every day | ORAL | Status: DC

## 2015-07-15 NOTE — Progress Notes (Signed)
STROKE NEUROLOGY FOLLOW UP NOTE  NAME: Rose Sullivan DOB: [birth year]  REASON FOR VISIT: stroke follow up HISTORY FROM: daughter and husband and chart  Today we had the pleasure of seeing Rose Sullivan in follow-up at our [facility]. Pt was accompanied by daughter and husband.   History Summary Rose Sullivan is a 82 y.o. female with history of hypertension, hyperlipidemia, diabetes mellitus, chronic pain, atrial fibrillation on coumadin but d/c due to falls, legally blind and metastatic breast cancer in remission who was admitted on 03/13/15 for altered mental status, increased lethargy, and memory problems. MRI showed L thalamic and L mesial temporal lobe infarct embolic secondary to known atrial fibrillation. CTA head and neck showed left P1 occlusion. EF 55-60% and EEG no seizure. LDL 112 and A1C 5.7. She was started on eliquis and lipitor. She was discharged to SNF.  04/11/15 readmission - for worsening confusion and right-sided weakness. She was also found to have UTI with sepsis and dehydration. MRI showed left PCA territory infarcts, which consistent with hypotension and hypoperfusion in the setting of left PCA occlusion and septic shock. She was also noted to take eliquis 5mg  daily which was underdosed. Her eliquis changed to 5mg  bid. Not tolerating lipitor and changed to prevastatin 40mg . She was again d/c to SNF  05/02/15 Follow up (CM) - pt has back to home with home PT/OT. Had lab draw at her primary care last week. BP 164/80. Continued on eliquis 5mg  bid and pravastatin. Dysphagia on thick liquids.  06/20/15 re-admission - for AMS and generalized weakness. Found to have UTI and dehydration again and treated with Abx. MRI showed slight extension of acute on chronic left occipital pole infarct, consistent with dehydration and hypoperfusion. She was NPO initially and was put on heparin drip but later switched back to eliquis bid after passed swallow. LDL 59.    Interval History During the interval time, the patient had further decline. Her daughter reported that pt seems very drowsy and sleepy during the day, can not participate home PT/OT. Not eating much due to somnolence. Palliative has been involved for goal of care, but daughter would not give up. Today pt came in with wheelchair, awake alert but paucity of speech.     REVIEW OF SYSTEMS: Full 14 system review of systems performed and notable only for those listed below and in HPI above, all others are negative:  Constitutional:   Cardiovascular:  Ear/Nose/Throat:   Skin:  Eyes:   Respiratory:   Gastroitestinal:   Genitourinary:  Hematology/Lymphatic:   Endocrine:  Musculoskeletal:   Allergy/Immunology:   Neurological:  Speech difficulty, confusion Psychiatric:  Sleep: daytime sleepiness  The following represents the patient's updated allergies and side effects list: Allergies  Allergen Reactions  . Trazodone And Nefazodone Other (See Comments)    hallucinations    The neurologically relevant items on the patient's problem list were reviewed on today's visit.  Neurologic Examination  A problem focused neurological exam (12 or more points of the single system neurologic examination, vital signs counts as 1 point, cranial nerves count for 8 points) was performed.  Blood pressure 134/89, pulse 66, height 5' (1.524 m).  General - Well nourished, well developed, in no apparent distress.  Ophthalmologic - Fundi not visualized due to noncooperation.  Cardiovascular - Regular rate and rhythm, not in afib.  Mental Status -  Awake alert, with eye contact, but significant psychomotor slowing. Able to say her name and husband name, able to say "  OK', 'yes", but significant paucity of speech. Able to follow limited verbal commands. Did not name or repeat.   Cranial Nerves II - XII - II - inconsistent with visual threat testing, but suspect right hemianopia. III, IV, VI -  Disconjugated eyes, but attend to both sides with sound. V - Facial sensation intact bilaterally. VII - Facial expression decreased bilaterally. VIII - Hard of hearing & vestibular intact bilaterally. X - Palate elevates symmetrically. XI - Chin turning & shoulder shrug intact bilaterally. XII - Tongue protrusion intact.  Motor Strength - The patient's strength was 4/5 BUEs and 2/5 BLEs and pronator drift was absent.  Bulk was normal and fasciculations were absent.   Motor Tone - Muscle tone was assessed at the neck and appendages and was normal.  Reflexes - The patient's reflexes were 1+ in all extremities and she had no pathological reflexes.  Sensory - Light touch, temperature/pinprick were assessed and were normal.    Coordination - not cooperative on exam.  Tremor was absent.  Gait and Station - not tested due to safety concerns, pt in wheelchair.   Functional score  mRS = 4   0 - No symptoms.   1 - No significant disability. Able to carry out all usual activities, despite some symptoms.   2 - Slight disability. Able to look after own affairs without assistance, but unable to carry out all previous activities.   3 - Moderate disability. Requires some help, but able to walk unassisted.   4 - Moderately severe disability. Unable to attend to own bodily needs without assistance, and unable to walk unassisted.   5 - Severe disability. Requires constant nursing care and attention, bedridden, incontinent.   6 - Dead.   NIH Stroke Scale   Level Of Consciousness 0=Alert; keenly responsive 1=Not alert, but arousable by minor stimulation 2=Not alert, requires repeated stimulation 3=Responds only with reflex movements 1  LOC Questions to Month and Age 57=Answers both questions correctly 1=Answers one question correctly 2=Answers neither question correctly 2  LOC Commands      -Open/Close eyes     -Open/close grip 0=Performs both tasks correctly 1=Performs one task  correctly 2=Performs neighter task correctly 1  Best Gaze 0=Normal 1=Partial gaze palsy 2=Forced deviation, or total gaze paresis 0  Visual 0=No visual loss 1=Partial hemianopia 2=Complete hemianopia 3=Bilateral hemianopia (blind including cortical blindness) 1  Facial Palsy 0=Normal symmetrical movement 1=Minor paralysis (asymmetry) 2=Partial paralysis (lower face) 3=Complete paralysis (upper and lower face) 0  Motor  0=No drift, limb holds posture for full 10 seconds 1=Drift, limb holds posture, no drift to bed 2=Some antigravity effort, cannot maintain posture, drifts to bed 3=No effort against gravity, limb falls 4=No movement Right Arm 0     Leg 2    Left Arm 0     Leg 2  Limb Ataxia 0=Absent 1=Present in one limb 2=Present in two limbs 0  Sensory 0=Normal 1=Mild to moderate sensory loss 2=Severe to total sensory loss 1  Best Language 0=No aphasia, normal 1=Mild to moderate aphasia 2=Mute, global aphasia 3=Mute, global aphasia 2  Dysarthria 0=Normal 1=Mild to moderate 2=Severe, unintelligible or mute/anarthric 2  Extinction/Neglect 0=No abnormality 1=Extinction to bilateral simultaneous stimulation 2=Profound neglect 0  Total   14     Data reviewed: I personally reviewed the images and agree with the radiology interpretations.  Dg Chest 2 View 03/13/2015 Mild CHF, with mild to moderate cardiomegaly, mild diffuse interstitial pulmonary edema and small bilateral pleural effusions.   Ct  Head Wo Contrast 03/13/2015 Similar findings of advanced atrophy and microvascular ischemic disease without acute intracranial process.   CTA NECK 03/14/2015 Negative.   CTA HEAD 03/14/2015 Occluded distal LEFT P1 segment. Moderate stenosis RIGHT P2 segment.   Mr Brain Wo Contrast 03/13/2015 1. Small acute/early subacute infarcts in the left thalamus and mesial left temporal lobe. 2. Extensive chronic small vessel ischemic disease and cerebral atrophy.   TTE  03/14/15 - Left ventricle: The cavity size was normal. There was mild focalbasal hypertrophy of the septum. Systolic function was normal.The estimated ejection fraction was in the range of 55% to 60%.Wall motion was normal; there were no regional wall motionabnormalities. - Mitral valve: There was mild regurgitation. - Pulmonic valve: There was trivial regurgitation  EEG 03/14/15 - This is a normal EEG for the patients stated age. There were no focal, hemispheric or lateralizing features. No epileptiform activity was recorded.  Ct Head Wo Contrast 04/11/2015 1. New small low-density focus within the lower left occipital lobe, most suggestive of a small infarct of subacute or chronic age. Favor subacute infarct, possibly chronic but new compared to the most recent head CT of 03/24/2015. This could be confirmed with MRI if clinically needed. 2. Additional chronic ischemic changes, as detailed above. 3. No acute-appearing findings. No intracranial hemorrhage.   MRI HEAD:  04/12/2015 Acute LEFT posterior cerebral artery territory infarcts (LEFT occipital lobe, LEFT thalamus). No hemorrhagic conversion. Please note, this is in similar vascular territory to recent infarcts. Severe chronic small vessel ischemic disease. Stable moderate to severe global parenchymal brain volume loss.  MRA HEAD 04/12/2015 Moderately motion degraded examination. Chronically occluded LEFT posterior cerebral artery. Stenotic RIGHT posterior cerebral artery better characterized on prior CTA head.   Ct Head Wo Contrast 06/18/2015 No CT evidence of acute intracranial abnormality. Changes of chronic microvascular ischemic disease, as well as encephalomalacia of prior left occipital infarction.   Mr Brain Wo Contrast 06/18/2015 1. Slight extension with acute on chronic infarct of the left occipital pole. 2. No other acute infarct. 3. Expected evolution of left occipital lobe infarct otherwise. 4. Stable advanced atrophy  and diffuse white matter disease reflecting the sequela of chronic microvascular ischemia.   EEG 06/20/15 - This is a normal EEG for the patients stated age. There were no focal, hemispheric or lateralizing features. No epileptiform activity was recorded. A normal EEG does not exclude the diagnosis of a seizure disorder and if seizure remains high on the list of differential diagnosis, an ambulatory EEG may be of value. Clinical correlation is required.  Component     Latest Ref Rng 03/13/2015 04/29/2015 06/18/2015 06/19/2015  Cholesterol     0 - 200 mg/dL  90    Triglycerides     <150 mg/dL  93.0    HDL Cholesterol     >40 mg/dL  34.40 (L)    VLDL     0 - 40 mg/dL  18.6    LDL (calc)     0 - 99 mg/dL  37    Total CHOL/HDL Ratio       3    NonHDL       55.14    Opiates     NONE DETECTED POSITIVE (A)     COCAINE     NONE DETECTED NONE DETECTED     Benzodiazepines     NONE DETECTED POSITIVE (A)     Amphetamines     NONE DETECTED NONE DETECTED     Tetrahydrocannabinol  NONE DETECTED NONE DETECTED     Barbiturates     NONE DETECTED NONE DETECTED     Folate, Hemolysate     Not Estab. ng/mL   250.8   HCT     34.0 - 46.6 %   39.8   Folate, RBC     >498 ng/mL   630   Hemoglobin A1C     4.8 - 5.6 %    5.4  Mean Plasma Glucose         108  TSH     0.350 - 4.500 uIU/mL    2.153  Vitamin B12     180 - 914 pg/mL   195   Sed Rate     0 - 22 mm/hr   7   RPR     Non Reactive   Non Reactive   ANA Ab, IFA        Negative    Component     Latest Ref Rng 06/20/2015  Cholesterol     0 - 200 mg/dL 109  Triglycerides     <150 mg/dL 84  HDL Cholesterol     >40 mg/dL 33 (L)  VLDL     0 - 40 mg/dL 17  LDL (calc)     0 - 99 mg/dL 59  Total CHOL/HDL Ratio      3.3  NonHDL        Opiates     NONE DETECTED   COCAINE     NONE DETECTED   Benzodiazepines     NONE DETECTED   Amphetamines     NONE DETECTED   Tetrahydrocannabinol     NONE DETECTED   Barbiturates     NONE  DETECTED   Folate, Hemolysate     Not Estab. ng/mL   HCT     34.0 - 46.6 %   Folate, RBC     >498 ng/mL   Hemoglobin A1C     4.8 - 5.6 %   Mean Plasma Glucose        TSH     0.350 - 4.500 uIU/mL   Vitamin B12     180 - 914 pg/mL   Sed Rate     0 - 22 mm/hr   RPR     Non Reactive   ANA Ab, IFA          Assessment: As you may recall, she is a 82 y.o. Caucasian female with PMH of hypertension, hyperlipidemia, diabetes mellitus, chronic pain, atrial fibrillation on coumadin but d/c due to falls, legally blind and metastatic breast cancer in remission who was admitted on 03/13/15 for altered mental status, increased lethargy, and memory problems. MRI showed L thalamic and L mesial temporal lobe infarct embolic secondary to known atrial fibrillation. CTA head and neck showed left P1 occlusion. EF 55-60% and EEG no seizure. LDL 112 and A1C 5.7. She was started on eliquis and lipitor. Readmitted on 04/11/15 for worsening confusion and right-sided weakness. She was also found to have UTI with sepsis and dehydration. MRI showed left PCA territory infarcts, which consistent with hypotension and hypoperfusion in the setting of left PCA occlusion and septic shock. She was also noted to take eliquis 5mg  daily which was underdosed. Her eliquis changed to 5mg  bid. Not tolerating lipitor and changed to prevastatin 40mg . Readmitted again on  06/20/15 for AMS and generalized weakness. Found to have UTI and dehydration again and treated with Abx. MRI showed slight extension of acute on chronic left  occipital pole infarct, consistent with dehydration and hypoperfusion. During the interval time, daughter reported that pt seems very drowsy and sleepy during the day, can not participate home PT/OT. Not eating much due to somnolence. Today in clinic, pt awake alert. His wake sleep cycle was apparently disturbed. Will try amantadine.       Plan:  - continue eliquis and pravastatin for stroke prevention - will give  amantadine for arousal during the day - take restoril at night to help sleep - correct sleep wake cycle, more activity during the day, keep quite time at night - Follow up with your primary care physician for stroke risk factor modification. Recommend maintain blood pressure goal <130/80, diabetes with hemoglobin A1c goal below 6.5% and lipids with LDL cholesterol goal below 70 mg/dL.  - drink more fluid and avoid UTI. Discuss with PCP regarding UTI prophylaxis. - follow up with cardiology and oncology as scheduled - continue home PT/OT/speech - follow up in 2 months.   A total of 45 minutes was spent face-to-face with this patient. Over half this time was spent on counseling patient daughter on the stroke prevention and differential diagnosis of AMS and correction of sleep wake cycle problem.    No orders of the defined types were placed in this encounter.    Meds ordered this encounter  Medications  . amantadine (SYMMETREL) 50 MG/5ML solution    Sig: 100mg  bid taking bid at 8am and 2pm.    Dispense:  473 mL    Refill:  3    Patient Instructions  - continue eliquis and pravastatin for stroke prevention - will give amantadine for arousal during the day - use restoril at night to help sleep - correct sleep wake cycle, more activity during the day, keep quite time at night - Follow up with your primary care physician for stroke risk factor modification. Recommend maintain blood pressure goal <130/80, diabetes with hemoglobin A1c goal below 6.5% and lipids with LDL cholesterol goal below 70 mg/dL.  - drink more fluid and avoid UTI. Discuss with PCP regarding UTI prophylaxis. - follow up with cardiology and oncology as scheduled - continue home PT/OT/speech - follow up in 2 months.    Rosalin Hawking, MD PhD [facility] [address] [place], [place] [address] [phone]

## 2015-07-15 NOTE — Telephone Encounter (Signed)
[id number])  Patient was in the wheelchair. And had slide down to far in the chair. Bryson Ha and the daughter could not get her back up in the chair they had to help slide her all the way to the ground. They did have to get ems to come help get her back up in the bed. Patient is fine and was not hurt.

## 2015-07-16 ENCOUNTER — Emergency Department (HOSPITAL_COMMUNITY): Payer: Medicare Other

## 2015-07-16 ENCOUNTER — Inpatient Hospital Stay (HOSPITAL_COMMUNITY)
Admission: EM | Admit: 2015-07-16 | Discharge: 2015-07-19 | DRG: 689 | Disposition: A | Discharge status: Hospice | Payer: Medicare Other | Attending: Internal Medicine | Admitting: Internal Medicine

## 2015-07-16 ENCOUNTER — Encounter (HOSPITAL_COMMUNITY): Payer: Self-pay | Admitting: Emergency Medicine

## 2015-07-16 ENCOUNTER — Other Ambulatory Visit: Payer: Self-pay

## 2015-07-16 DIAGNOSIS — Z853 Personal history of malignant neoplasm of breast: Secondary | ICD-10-CM

## 2015-07-16 DIAGNOSIS — N3000 Acute cystitis without hematuria: Secondary | ICD-10-CM | POA: Diagnosis not present

## 2015-07-16 DIAGNOSIS — I129 Hypertensive chronic kidney disease with stage 1 through stage 4 chronic kidney disease, or unspecified chronic kidney disease: Secondary | ICD-10-CM | POA: Diagnosis present

## 2015-07-16 DIAGNOSIS — E785 Hyperlipidemia, unspecified: Secondary | ICD-10-CM | POA: Diagnosis present

## 2015-07-16 DIAGNOSIS — E860 Dehydration: Secondary | ICD-10-CM | POA: Diagnosis present

## 2015-07-16 DIAGNOSIS — N179 Acute kidney failure, unspecified: Secondary | ICD-10-CM

## 2015-07-16 DIAGNOSIS — Z515 Encounter for palliative care: Secondary | ICD-10-CM | POA: Insufficient documentation

## 2015-07-16 DIAGNOSIS — Z9011 Acquired absence of right breast and nipple: Secondary | ICD-10-CM

## 2015-07-16 DIAGNOSIS — G92 Toxic encephalopathy: Secondary | ICD-10-CM | POA: Diagnosis present

## 2015-07-16 DIAGNOSIS — R1319 Other dysphagia: Secondary | ICD-10-CM | POA: Diagnosis present

## 2015-07-16 DIAGNOSIS — G9389 Other specified disorders of brain: Secondary | ICD-10-CM | POA: Diagnosis present

## 2015-07-16 DIAGNOSIS — N183 Chronic kidney disease, stage 3 (moderate): Secondary | ICD-10-CM | POA: Diagnosis present

## 2015-07-16 DIAGNOSIS — R638 Other symptoms and signs concerning food and fluid intake: Secondary | ICD-10-CM | POA: Insufficient documentation

## 2015-07-16 DIAGNOSIS — G9341 Metabolic encephalopathy: Secondary | ICD-10-CM

## 2015-07-16 DIAGNOSIS — E1122 Type 2 diabetes mellitus with diabetic chronic kidney disease: Secondary | ICD-10-CM | POA: Diagnosis present

## 2015-07-16 DIAGNOSIS — Z66 Do not resuscitate: Secondary | ICD-10-CM | POA: Diagnosis present

## 2015-07-16 DIAGNOSIS — R5383 Other fatigue: Secondary | ICD-10-CM | POA: Insufficient documentation

## 2015-07-16 DIAGNOSIS — I480 Paroxysmal atrial fibrillation: Secondary | ICD-10-CM | POA: Diagnosis present

## 2015-07-16 DIAGNOSIS — Z6832 Body mass index (BMI) 32.0-32.9, adult: Secondary | ICD-10-CM | POA: Diagnosis not present

## 2015-07-16 DIAGNOSIS — B9620 Unspecified Escherichia coli [E. coli] as the cause of diseases classified elsewhere: Secondary | ICD-10-CM | POA: Diagnosis present

## 2015-07-16 DIAGNOSIS — N390 Urinary tract infection, site not specified: Principal | ICD-10-CM | POA: Diagnosis present

## 2015-07-16 DIAGNOSIS — R410 Disorientation, unspecified: Secondary | ICD-10-CM | POA: Diagnosis not present

## 2015-07-16 DIAGNOSIS — Z79811 Long term (current) use of aromatase inhibitors: Secondary | ICD-10-CM | POA: Diagnosis not present

## 2015-07-16 DIAGNOSIS — I69320 Aphasia following cerebral infarction: Secondary | ICD-10-CM

## 2015-07-16 DIAGNOSIS — Z7901 Long term (current) use of anticoagulants: Secondary | ICD-10-CM

## 2015-07-16 DIAGNOSIS — T424X5A Adverse effect of benzodiazepines, initial encounter: Secondary | ICD-10-CM | POA: Diagnosis present

## 2015-07-16 DIAGNOSIS — R627 Adult failure to thrive: Secondary | ICD-10-CM

## 2015-07-16 DIAGNOSIS — E669 Obesity, unspecified: Secondary | ICD-10-CM | POA: Diagnosis present

## 2015-07-16 DIAGNOSIS — R4182 Altered mental status, unspecified: Secondary | ICD-10-CM | POA: Diagnosis not present

## 2015-07-16 DIAGNOSIS — R404 Transient alteration of awareness: Secondary | ICD-10-CM | POA: Diagnosis not present

## 2015-07-16 DIAGNOSIS — K219 Gastro-esophageal reflux disease without esophagitis: Secondary | ICD-10-CM | POA: Diagnosis present

## 2015-07-16 DIAGNOSIS — H548 Legal blindness, as defined in USA: Secondary | ICD-10-CM | POA: Diagnosis present

## 2015-07-16 LAB — COMPREHENSIVE METABOLIC PANEL
ALBUMIN: 3.7 g/dL (ref 3.5–5.0)
ALK PHOS: 64 U/L (ref 38–126)
ALT: 26 U/L (ref 14–54)
ANION GAP: 10 (ref 5–15)
AST: 39 U/L (ref 15–41)
BUN: 44 mg/dL — ABNORMAL HIGH (ref 6–20)
CALCIUM: 9.2 mg/dL (ref 8.9–10.3)
CHLORIDE: 112 mmol/L — AB (ref 101–111)
CO2: 20 mmol/L — AB (ref 22–32)
Creatinine, Ser: 1.43 mg/dL — ABNORMAL HIGH (ref 0.44–1.00)
GFR calc non Af Amer: 34 mL/min — ABNORMAL LOW (ref >60)
GFR, EST AFRICAN AMERICAN: 39 mL/min — AB (ref >60)
GLUCOSE: 83 mg/dL (ref 65–99)
Potassium: 4.2 mmol/L (ref 3.5–5.1)
SODIUM: 142 mmol/L (ref 135–145)
Total Bilirubin: 2.1 mg/dL — ABNORMAL HIGH (ref 0.3–1.2)
Total Protein: 6.8 g/dL (ref 6.5–8.1)

## 2015-07-16 LAB — URINALYSIS, ROUTINE W REFLEX MICROSCOPIC
Glucose, UA: NEGATIVE mg/dL
Ketones, ur: 15 mg/dL — AB
NITRITE: POSITIVE — AB
PH: 5.5 (ref 5.0–8.0)
Protein, ur: 30 mg/dL — AB
SPECIFIC GRAVITY, URINE: 1.026 (ref 1.005–1.030)

## 2015-07-16 LAB — URINE MICROSCOPIC-ADD ON

## 2015-07-16 LAB — CBC WITH DIFFERENTIAL/PLATELET
BASOS ABS: 0 10*3/uL (ref 0.0–0.1)
BASOS PCT: 0 %
EOS PCT: 5 %
Eosinophils Absolute: 0.4 10*3/uL (ref 0.0–0.7)
HCT: 40.8 % (ref 36.0–46.0)
Hemoglobin: 13.3 g/dL (ref 12.0–15.0)
Lymphocytes Relative: 17 %
Lymphs Abs: 1.5 10*3/uL (ref 0.7–4.0)
MCH: 30.9 pg (ref 26.0–34.0)
MCHC: 32.6 g/dL (ref 30.0–36.0)
MCV: 94.7 fL (ref 78.0–100.0)
MONO ABS: 0.5 10*3/uL (ref 0.1–1.0)
MONOS PCT: 5 %
Neutro Abs: 6.6 10*3/uL (ref 1.7–7.7)
Neutrophils Relative %: 73 %
PLATELETS: 246 10*3/uL (ref 150–400)
RBC: 4.31 MIL/uL (ref 3.87–5.11)
RDW: 13.6 % (ref 11.5–15.5)
WBC: 9.1 10*3/uL (ref 4.0–10.5)

## 2015-07-16 LAB — I-STAT CG4 LACTIC ACID, ED
LACTIC ACID, VENOUS: 1.18 mmol/L (ref 0.5–2.0)
Lactic Acid, Venous: 1.41 mmol/L (ref 0.5–2.0)

## 2015-07-16 MED ORDER — SODIUM CHLORIDE 0.9 % IV SOLN: INTRAVENOUS | Status: DC

## 2015-07-16 MED ORDER — HEPARIN (PORCINE) IN NACL 100-0.45 UNIT/ML-% IJ SOLN
700.0000 [IU]/h | INTRAMUSCULAR | Status: DC
  Administered 2015-07-17: 800 [IU]/h via INTRAVENOUS
  Filled 2015-07-16: qty 250

## 2015-07-16 MED ORDER — SODIUM CHLORIDE 0.9 % IV BOLUS (SEPSIS)
500.0000 mL | Freq: Once | INTRAVENOUS | Status: AC
  Administered 2015-07-16: 500 mL via INTRAVENOUS

## 2015-07-16 MED ORDER — METOPROLOL TARTRATE 5 MG/5ML IV SOLN
2.5000 mg | Freq: Three times a day (TID) | INTRAVENOUS | Status: DC
  Administered 2015-07-17 – 2015-07-19 (×8): 2.5 mg via INTRAVENOUS
  Filled 2015-07-16 (×7): qty 5

## 2015-07-16 MED ORDER — SODIUM CHLORIDE 0.9% FLUSH
3.0000 mL | Freq: Two times a day (BID) | INTRAVENOUS | Status: DC
  Administered 2015-07-17 – 2015-07-18 (×2): 3 mL via INTRAVENOUS

## 2015-07-16 MED ORDER — DIPHENHYDRAMINE HCL 12.5 MG/5ML PO ELIX: 6.2500 mg | ORAL_SOLUTION | Freq: Three times a day (TID) | ORAL | Status: DC | PRN

## 2015-07-16 MED ORDER — DEXTROSE 5 % IV SOLN
1.0000 g | Freq: Once | INTRAVENOUS | Status: DC
  Filled 2015-07-16: qty 10

## 2015-07-16 MED ORDER — DEXTROSE-NACL 5-0.9 % IV SOLN
INTRAVENOUS | Status: DC
Start: 2015-07-16 — End: 2015-07-19
  Administered 2015-07-17 – 2015-07-19 (×5): via INTRAVENOUS

## 2015-07-16 NOTE — ED Notes (Signed)
Patient transported to CT 

## 2015-07-16 NOTE — ED Notes (Addendum)
From home via EMS: Family told EMS patient having sleeping problems, so PCP prescribed Restoril 30mg  and cut back to 15mg  if too much. Daughter gave 30mg  Restoril on Thursday and patient has been sleeping since then. Responds only to pain with grimacing and pulling away. Family could not get her to wake up. At baseline she is aphasic and has left gaze from old stroke, but usually is receptive, alert, and follows commands. Arrives VSS with only pulling away from care, not opening eyes. No meds, food or drink since Thursday.

## 2015-07-16 NOTE — ED Provider Notes (Signed)
CSN: [id number]     Arrival date & time 07/16/15  1526 History   First MD Initiated Contact with Patient 07/16/15 1527     Chief Complaint  Patient presents with  . Altered Mental Status    Patient is a 82 y.o. female presenting with altered mental status. The history is provided by the EMS personnel and a relative (Spoke to daughter, [name], on the phone).  Altered Mental Status Presenting symptoms: partial responsiveness   Severity:  Severe Most recent episode:  2 days ago Episode history:  Single Duration: Since 8:30pm on Thursday, 07/14/15. Timing:  Constant Progression:  Improving Chronicity:  New Context: recent infection (UTI)   Context comment:  New medication: Restoril   Past Medical History  Diagnosis Date  . HYPERLIPIDEMIA   . BLINDNESS, [place], [place] DEFINITION 11/25/2006  . HYPERTENSION   . PAF (paroxysmal atrial fibrillation) ([place])     a. started on amio 05/2015 by IM.  Marland Kitchen GERD 02/11/2007  . LOW BACK PAIN, CHRONIC 11/25/2006  . BACK PAIN 12/10/2007  . Pain in Soft Tissues of Limb 02/11/2007  . OSTEOPENIA 02/11/2007  . GAIT DISTURBANCE 12/10/2007  . Abdominal pain, left lower quadrant 04/01/2007  . BREAST CANCER, HX OF 11/25/2006  . Nocturia 05/18/2010  . Hypersomnia 05/18/2010  . Impaired glucose tolerance     a. hx of diet controlled DM per chart.  . Allergic rhinitis, cause unspecified 07/23/2012  . Anxiety state, unspecified 02/04/2013  . Cancer ([place])   . CKD (chronic kidney disease), stage II     CKD II-III per labs  . Atrial flutter ([place])     a. s/p remote ablation.  . SVT (supraventricular tachycardia) ([place])   . Obesity   . Shortness of breath   . Stroke [facility])     a. Stroke 03/2015 with readmission 03/2015 for extension of stroke felt 2/2 hypotension from UTI.  b. Adm 05/2015 for encephalopathy encephalopathy secondary to left occipital stroke extension (not afib cardioembolic pattern, more likely related to hypotension from dehydration/UTI).   Past Surgical  History  Procedure Laterality Date  . Dilation and curettage of uterus    . S/p mastectomy Right   . Appendectomy    . Cataract extraction    . Knee surgery Right    Family History  Problem Relation Age of Onset  . Hypertension Brother   . Lung cancer Brother   . Colon cancer Father   . COPD Brother   . Stroke Mother   . Colon cancer Father    Social History  Substance Use Topics  . Smoking status: Never Smoker   . Smokeless tobacco: Never Used     Comment: quit 40 years ago  . Alcohol Use: No   OB History    No data available     Review of Systems  Unable to perform ROS: Patient nonverbal    Allergies  Trazodone and nefazodone  Home Medications   Prior to Admission medications   Medication Sig Start Date End Date Taking? Authorizing Provider  amantadine (SYMMETREL) 50 MG/5ML solution 100mg  bid taking bid at 8am and 2pm. 07/14/15   Rosalin Hawking, MD  amiodarone (PACERONE) 200 MG tablet Take 1 tablet (200 mg total) by mouth daily. 07/15/15   Dayna N Dunn, PA-C  apixaban (ELIQUIS) 5 MG TABS tablet Take 1 tablet (5 mg total) by mouth 2 (two) times daily. 06/23/15   Nita Sells, MD  Diclofenac Sodium 1.5 % SOLN Place 2 mLs onto the skin 3 (  three) times daily. 04/05/15   Meredith Staggers, MD  exemestane (AROMASIN) 25 MG tablet TAKE 1 TABLET BY MOUTH EVERY DAY 05/31/15   Biagio Borg, MD  metoprolol tartrate (LOPRESSOR) 25 MG tablet Take 0.5 tablets (12.5 mg total) by mouth 2 (two) times daily. 03/28/15   Lavon Paganini Angiulli, PA-C  oxyCODONE-acetaminophen (PERCOCET) 7.5-325 MG tablet Take 1 tablet by mouth every 8 (eight) hours as needed. 06/03/15   Bayard Hugger, NP  Pramoxine-Camphor-Zinc Acetate (ANTI ITCH EX) Apply 1 application topically as needed (for itching).    Historical Provider, MD  pravastatin (PRAVACHOL) 40 MG tablet TAKE 1 TABLET BY MOUTH EVERY DAY AT 6 PM 07/13/15   Biagio Borg, MD  temazepam (RESTORIL) 30 MG capsule TAKE ONE CAPSULE BY MOUTH AT BEDTIME AS NEEDED  FOR SLEEP 06/24/15   Biagio Borg, MD   BP 152/89 mmHg  Pulse 86  Resp 17  SpO2 96% Physical Exam  Constitutional: She appears well-developed and well-nourished. No distress.  HENT:  Head: Normocephalic and atraumatic.  Right Ear: External ear normal.  Left Ear: External ear normal.  Mouth/Throat: Mucous membranes are dry.  Eyes: Pupils are equal, round, and reactive to light.  Cardiovascular: Normal rate and regular rhythm.   Pulmonary/Chest: Effort normal and breath sounds normal. She has no decreased breath sounds.  Right sided mastectomy (well healed)  Abdominal: Soft. She exhibits no distension. There is no guarding.  Neurological:  Withdraws from noxious stimuli in all extremities, clenches eyes shut equally when attempting to examine pupils, bites down on tongue depressor when evaluating gag reflex  Skin: Skin is warm and dry. She is not diaphoretic.  Psychiatric:  Unable to assess    ED Course  Procedures  Labs Review Labs Reviewed  COMPREHENSIVE METABOLIC PANEL - Abnormal; Notable for the following:    Chloride 112 (*)    CO2 20 (*)    BUN 44 (*)    Creatinine, Ser 1.43 (*)    Total Bilirubin 2.1 (*)    GFR calc non Af Amer 34 (*)    GFR calc Af Amer 39 (*)    All other components within normal limits  URINALYSIS, ROUTINE W REFLEX MICROSCOPIC (NOT AT [facility]) - Abnormal; Notable for the following:    Color, Urine AMBER (*)    APPearance TURBID (*)    Hgb urine dipstick MODERATE (*)    Bilirubin Urine SMALL (*)    Ketones, ur 15 (*)    Protein, ur 30 (*)    Nitrite POSITIVE (*)    Leukocytes, UA LARGE (*)    All other components within normal limits  URINE MICROSCOPIC-ADD ON - Abnormal; Notable for the following:    Squamous Epithelial / LPF 6-30 (*)    Bacteria, UA MANY (*)    All other components within normal limits  URINE CULTURE  CULTURE, BLOOD (ROUTINE X 2)  CULTURE, BLOOD (ROUTINE X 2)  CBC WITH DIFFERENTIAL/PLATELET  I-STAT CG4 LACTIC ACID, ED     Imaging Review Ct Head Wo Contrast  07/16/2015  CLINICAL DATA:  Altered mental status. EXAM: CT HEAD WITHOUT CONTRAST TECHNIQUE: Contiguous axial images were obtained from the base of the skull through the vertex without intravenous contrast. COMPARISON:  MRI and head CT, 06/18/2015. FINDINGS: The ventricles are normal configuration. There is ventricular and sulcal enlargement reflecting moderate atrophy, stable from prior exam. There are no parenchymal masses or mass effect. There is no evidence of a recent cortical infarct. Patchy white  matter hypoattenuation is noted consistent with advanced chronic microvascular ischemic change. Chronic small left occipital infarct has evolved since the prior imaging. There are no extra-axial masses or abnormal fluid collections. There is no intracranial hemorrhage. The visualized sinuses and mastoid air cells are clear. IMPRESSION: 1. No acute intracranial abnormalities. 2. Moderate atrophy and advanced chronic microvascular ischemic change stable from the prior CT. Old left occipital infarct. Electronically Signed   By: Lajean Manes M.D.   On: 07/16/2015 17:30   Dg Chest Portable 1 View  07/16/2015  CLINICAL DATA:  Altered mental status. EXAM: PORTABLE CHEST 1 VIEW COMPARISON:  06/18/2015 FINDINGS: Patient slightly rotated to the left. Lungs are adequately inflated with possible left base/ retrocardiac opacification which may be due to small effusion and atelectasis although cannot exclude infection. Borderline cardiomegaly. Minimal calcified plaque over the aortic arch. There are degenerative changes of the spine. Slight progression a fairly well-defined 2.5 cm sclerotic focus over the left humeral head/ neck. Mild sclerosis of right humeral neck without significant change. IMPRESSION: Mild left base opacification which may be due to small effusion with atelectasis, although cannot exclude infection. Possible slight progression a well-defined 2.5 cm sclerotic  focus over the left humeral head/ neck. Stable mild sclerosis of the right humeral neck. Findings possibly due to bone infarcts, although cannot exclude metastatic disease. Recommend clinical correlation as dedicated right shoulder films may be helpful for further evaluation. Electronically Signed   By: Marin Olp M.D.   On: 07/16/2015 16:34   I have personally reviewed and evaluated these images and lab results as part of my medical decision-making.   EKG Interpretation None      MDM   Final diagnoses:  Urinary tract infection without hematuria, site unspecified   82 y.o. female presents from home due to altered mental status. She isn't altered since Thursday, 6/15 at around 8:30 when she was given a dose of Restoril for the first time. Since that time she has had significantly decreased by mouth intake, and when her daughter tries to give her pills by mouth she is not awake enough to swallow them. Remainder of history of present illness, review of systems, exam as above. All are markedly limited by the patient's altered mental status.  Labs were obtained and are notable for a UTI. She was given IV fluid hydration and IV antibiotics in the ED. Chest x-ray showed mild opacification of the left base. She is breathing normally on room air with normal oxygen saturation. No cough. At this time I do not suspect that this finding represents an acute infection.  CT head showed no acute intracranial abnormalities.  3:53 PM I spoke with Gwynneth Munson, daughter, emergency contact listed in the chart; patient is DNR/DNI  When her daughter arrived to bedside, the patient is more "perky" per her daughter -- she is opening her eyes and looking at her daughter.  I discussed her case with the hospitalist. She was admitted for further evaluation and management of her altered mental status with UTI.  Case managed in conjunction with my attending, Dr. Laneta Simmers.   Berenice Primas, MD 07/17/15  [id number]  Leo Grosser, MD 07/17/15 [phone]

## 2015-07-16 NOTE — H&P (Signed)
History and Physical  Rose Sullivan [id number] DOB: 13-Nov-1933 DOA: 07/16/2015  Referring physician: EDP PCP: [name], MD   Chief Complaint: not responding, family reported patient has been sleeping since Thursday after restoril  HPI: patient is not responding, no family in room, hpi obtained from talking to [place] and chart review.   Rose Sullivan is a 82 y.o. female  With h/o paf on apixaban, h/o breast cancer on aromasin, h/o  recurrent cva last in 05/2015, At baseline she is aphasic and has left gaze from old stroke, but usually is receptive, alert, and follows commands is brought to [facility] by EMS due to not responding, per daughter patient has been sleeping since she took restoril on Thursday, patient  Responds only to pain with grimacing and pulling away, patient has not been eating either.  ED course: patient vital stable, on room air, CT head no acute findings, cbc wnl, bmp with elevated bun/cr 44/1.43, total bilirubin 2.1, lactic acid wnl, ua with many bacteria, large leukocytes and positive nitrite, urine culture and blood culture drawn in the ED, she received rocephin x1 , hospitalist called to admit the patient.  Review of Systems:  Detail per HPI, Review of systems are otherwise negative  Past Medical History  Diagnosis Date  . HYPERLIPIDEMIA   . BLINDNESS, [place], [place] DEFINITION 11/25/2006  . HYPERTENSION   . PAF (paroxysmal atrial fibrillation) ([place])     a. started on amio 05/2015 by IM.  Marland Kitchen GERD 02/11/2007  . LOW BACK PAIN, CHRONIC 11/25/2006  . BACK PAIN 12/10/2007  . Pain in Soft Tissues of Limb 02/11/2007  . OSTEOPENIA 02/11/2007  . GAIT DISTURBANCE 12/10/2007  . Abdominal pain, left lower quadrant 04/01/2007  . BREAST CANCER, HX OF 11/25/2006  . Nocturia 05/18/2010  . Hypersomnia 05/18/2010  . Impaired glucose tolerance     a. hx of diet controlled DM per chart.  . Allergic rhinitis, cause unspecified 07/23/2012  . Anxiety state, unspecified 02/04/2013   . Cancer ([place])   . CKD (chronic kidney disease), stage II     CKD II-III per labs  . Atrial flutter ([place])     a. s/p remote ablation.  . SVT (supraventricular tachycardia) ([place])   . Obesity   . Shortness of breath   . Stroke [facility])     a. Stroke 03/2015 with readmission 03/2015 for extension of stroke felt 2/2 hypotension from UTI.  b. Adm 05/2015 for encephalopathy encephalopathy secondary to left occipital stroke extension (not afib cardioembolic pattern, more likely related to hypotension from dehydration/UTI).   Past Surgical History  Procedure Laterality Date  . Dilation and curettage of uterus    . S/p mastectomy Right   . Appendectomy    . Cataract extraction    . Knee surgery Right    Social History:  reports that she has never smoked. She has never used smokeless tobacco. She reports that she does not drink alcohol or use illicit drugs. Patient lives at home & is dependnet in activities of daily living  Allergies  Allergen Reactions  . Temazepam Other (See Comments)    30mg  dose is too strong, brought her to hospital 07/16/15-per patient  . Trazodone And Nefazodone Other (See Comments)    hallucinations    Family History  Problem Relation Age of Onset  . Hypertension Brother   . Lung cancer Brother   . Colon cancer Father   . COPD Brother   . Stroke Mother   . Colon  cancer Father       Prior to Admission medications   Medication Sig Start Date End Date Taking? Authorizing Provider  amantadine (SYMMETREL) 50 MG/5ML solution 100mg  bid taking bid at 8am and 2pm. Patient taking differently: Take 100 mg by mouth 2 (two) times daily. 100mg  bid taking bid at 8am and 2pm. 07/14/15  Yes Rosalin Hawking, MD  amiodarone (PACERONE) 200 MG tablet Take 1 tablet (200 mg total) by mouth daily. 07/15/15  Yes Dayna N Dunn, PA-C  apixaban (ELIQUIS) 5 MG TABS tablet Take 1 tablet (5 mg total) by mouth 2 (two) times daily. 06/23/15  Yes Nita Sells, MD  Diclofenac Sodium 1.5 % SOLN  Place 2 mLs onto the skin 3 (three) times daily. 04/05/15  Yes Meredith Staggers, MD  exemestane (AROMASIN) 25 MG tablet TAKE 1 TABLET BY MOUTH EVERY DAY 05/31/15  Yes Biagio Borg, MD  metoprolol tartrate (LOPRESSOR) 25 MG tablet Take 0.5 tablets (12.5 mg total) by mouth 2 (two) times daily. 03/28/15  Yes Daniel J Angiulli, PA-C  naproxen sodium (ANAPROX) 220 MG tablet Take 220 mg by mouth 2 (two) times daily as needed (for pain).   Yes Historical Provider, MD  Pramoxine-Camphor-Zinc Acetate (ANTI ITCH EX) Apply 1 application topically as needed (for itching).   Yes Historical Provider, MD  pravastatin (PRAVACHOL) 40 MG tablet TAKE 1 TABLET BY MOUTH EVERY DAY AT 6 PM 07/13/15  Yes Biagio Borg, MD  temazepam (RESTORIL) 30 MG capsule TAKE ONE CAPSULE BY MOUTH AT BEDTIME AS NEEDED FOR SLEEP 06/24/15  Yes Biagio Borg, MD  oxyCODONE-acetaminophen (PERCOCET) 7.5-325 MG tablet Take 1 tablet by mouth every 8 (eight) hours as needed. 06/03/15   Bayard Hugger, NP    Physical Exam: BP 160/81 mmHg  Pulse 83  Temp(Src) 98.9 F (37.2 C) (Rectal)  Resp 12  SpO2 97%  General:  Frail, moaning, grimacing when touched, won't open eyes Eyes: won't open eyes ENT: dry oral mucosa Neck: supple, no JVD Cardiovascular: RRR Respiratory: CTABL Abdomen: soft/ND/ND, positive bowel sounds Skin: no rash Musculoskeletal:  No edema Psychiatric: moaning,  Neurologic: moaning, grimacing when touched, won't open eyes, does not follow commands          Labs on Admission:  Basic Metabolic Panel:  Recent Labs Lab 07/16/15 1356  NA 142  K 4.2  CL 112*  CO2 20*  GLUCOSE 83  BUN 44*  CREATININE 1.43*  CALCIUM 9.2   Liver Function Tests:  Recent Labs Lab 07/16/15 1356  AST 39  ALT 26  ALKPHOS 64  BILITOT 2.1*  PROT 6.8  ALBUMIN 3.7   No results for input(s): LIPASE, AMYLASE in the last 168 hours. No results for input(s): AMMONIA in the last 168 hours. CBC:  Recent Labs Lab 07/16/15 1356  WBC 9.1   NEUTROABS 6.6  HGB 13.3  HCT 40.8  MCV 94.7  PLT 246   Cardiac Enzymes: No results for input(s): CKTOTAL, CKMB, CKMBINDEX, TROPONINI in the last 168 hours.  BNP (last 3 results) No results for input(s): BNP in the last 8760 hours.  ProBNP (last 3 results) No results for input(s): PROBNP in the last 8760 hours.  CBG: No results for input(s): GLUCAP in the last 168 hours.  Radiological Exams on Admission: Ct Head Wo Contrast  07/16/2015  CLINICAL DATA:  Altered mental status. EXAM: CT HEAD WITHOUT CONTRAST TECHNIQUE: Contiguous axial images were obtained from the base of the skull through the vertex without intravenous contrast. COMPARISON:  MRI and head CT, 06/18/2015. FINDINGS: The ventricles are normal configuration. There is ventricular and sulcal enlargement reflecting moderate atrophy, stable from prior exam. There are no parenchymal masses or mass effect. There is no evidence of a recent cortical infarct. Patchy white matter hypoattenuation is noted consistent with advanced chronic microvascular ischemic change. Chronic small left occipital infarct has evolved since the prior imaging. There are no extra-axial masses or abnormal fluid collections. There is no intracranial hemorrhage. The visualized sinuses and mastoid air cells are clear. IMPRESSION: 1. No acute intracranial abnormalities. 2. Moderate atrophy and advanced chronic microvascular ischemic change stable from the prior CT. Old left occipital infarct. Electronically Signed   By: Lajean Manes M.D.   On: 07/16/2015 17:30   Dg Chest Portable 1 View  07/16/2015  CLINICAL DATA:  Altered mental status. EXAM: PORTABLE CHEST 1 VIEW COMPARISON:  06/18/2015 FINDINGS: Patient slightly rotated to the left. Lungs are adequately inflated with possible left base/ retrocardiac opacification which may be due to small effusion and atelectasis although cannot exclude infection. Borderline cardiomegaly. Minimal calcified plaque over the aortic  arch. There are degenerative changes of the spine. Slight progression a fairly well-defined 2.5 cm sclerotic focus over the left humeral head/ neck. Mild sclerosis of right humeral neck without significant change. IMPRESSION: Mild left base opacification which may be due to small effusion with atelectasis, although cannot exclude infection. Possible slight progression a well-defined 2.5 cm sclerotic focus over the left humeral head/ neck. Stable mild sclerosis of the right humeral neck. Findings possibly due to bone infarcts, although cannot exclude metastatic disease. Recommend clinical correlation as dedicated right shoulder films may be helpful for further evaluation. Electronically Signed   By: Marin Olp M.D.   On: 07/16/2015 16:34    EKG: Independently reviewed. Sinus rhythm with a few pac's, no acute st/t changes, qtc 480  Assessment/Plan Present on Admission:  . UTI (urinary tract infection)   encephalopathy/Altered mental status, not wake up since Thursday.  CT head no acute changes Side effect from restoril? Patient also has uti. Will keep npo, continue ivf, convert meds to iv.  UTI/dehydration/aki:  Blood and urine culture in process Continue rocephin that was started in the ED  PAF: continue tele, currently npo, oral meds held. Start iv lopressor/heparin drip.  H/o recurrent CVA: oral meds held  FTT with frequent hospitalization in 2017, patient is DNR/DNI, daughter want to treat the treatable.    DVT prophylaxis: on heparin drip  Consultants: none  Code Status: DNR/DNI, confirmed with family, daughter do want to treat the treatable  Family Communication:  Patient   Disposition Plan: admit to med tele  Time spent: 30mins  Abriel Hattery MD, PhD [facility] Pager [phone] If 7PM-7AM, please contact night-coverage at [email or web address], password [facility]

## 2015-07-16 NOTE — Progress Notes (Signed)
ANTICOAGULATION CONSULT NOTE - Initial Consult  Pharmacy Consult for Heparin Indication: atrial fibrillation and stroke  Allergies  Allergen Reactions  . Temazepam Other (See Comments)    30mg  dose is too strong, brought her to hospital 07/16/15-per patient  . Trazodone And Nefazodone Other (See Comments)    hallucinations    Patient Measurements:    Wt: 75 kg Ht: 60 inches IBW: 45.5 kg  Heparin Dosing Weight: 62.3 kg  Vital Signs: Temp: 98.9 F (37.2 C) (06/17 1555) Temp Source: Rectal (06/17 1555) BP: 160/81 mmHg (06/17 1600) Pulse Rate: 83 (06/17 1600)  Labs:  Recent Labs  07/16/15 1356  HGB 13.3  HCT 40.8  PLT 246  CREATININE 1.43*    CrCl cannot be calculated (Unknown ideal weight.).   Medical History: Past Medical History  Diagnosis Date  . HYPERLIPIDEMIA   . BLINDNESS, [place], [place] DEFINITION 11/25/2006  . HYPERTENSION   . PAF (paroxysmal atrial fibrillation) ([place])     a. started on amio 05/2015 by IM.  Marland Kitchen GERD 02/11/2007  . LOW BACK PAIN, CHRONIC 11/25/2006  . BACK PAIN 12/10/2007  . Pain in Soft Tissues of Limb 02/11/2007  . OSTEOPENIA 02/11/2007  . GAIT DISTURBANCE 12/10/2007  . Abdominal pain, left lower quadrant 04/01/2007  . BREAST CANCER, HX OF 11/25/2006  . Nocturia 05/18/2010  . Hypersomnia 05/18/2010  . Impaired glucose tolerance     a. hx of diet controlled DM per chart.  . Allergic rhinitis, cause unspecified 07/23/2012  . Anxiety state, unspecified 02/04/2013  . Cancer ([place])   . CKD (chronic kidney disease), stage II     CKD II-III per labs  . Atrial flutter ([place])     a. s/p remote ablation.  . SVT (supraventricular tachycardia) ([place])   . Obesity   . Shortness of breath   . Stroke [facility])     a. Stroke 03/2015 with readmission 03/2015 for extension of stroke felt 2/2 hypotension from UTI.  b. Adm 05/2015 for encephalopathy encephalopathy secondary to left occipital stroke extension (not afib cardioembolic pattern, more likely related to  hypotension from dehydration/UTI).    Medications:  Apixaban 5 mg bid PTA - last dose prior to Thursday, 6/15  Assessment: 79 YOF who presented to the [facility] on 6/17 with over-sedation in the setting of recent restoril use. The patient has not taken any meds, food, or drink since Thurs, 6/15. PTA - the patient was on Apixaban for new CVA in Feb '17 with recurrent CVA in March '17 d/t underdosing of apixaban. Pharmacy now consulted to bridge with heparin while the patient is NPO.   Head CT this admit is negative for bleed or new CVA. Hep wt: 62 kg, baseline CBC wnl.   Goal of Therapy:  Heparin level 0.3-0.7 units/ml Monitor platelets by anticoagulation protocol: Yes   Plan:  1. Start heparin at a rate of 800 units/hr (8 ml/hr) 2. Daily HL/aPTTs initially 3. Will continue to monitor for any signs/symptoms of bleeding and will follow up with aPTT and heparin level in 8 hours   Alycia Rossetti, PharmD, BCPS Clinical Pharmacist Pager: [phone] 07/16/2015 7:39 PM

## 2015-07-17 ENCOUNTER — Encounter (HOSPITAL_COMMUNITY): Payer: Self-pay | Admitting: *Deleted

## 2015-07-17 LAB — COMPREHENSIVE METABOLIC PANEL
ALBUMIN: 3.1 g/dL — AB (ref 3.5–5.0)
ALT: 23 U/L (ref 14–54)
ANION GAP: 9 (ref 5–15)
AST: 33 U/L (ref 15–41)
Alkaline Phosphatase: 54 U/L (ref 38–126)
BILIRUBIN TOTAL: 1.8 mg/dL — AB (ref 0.3–1.2)
BUN: 40 mg/dL — AB (ref 6–20)
CHLORIDE: 113 mmol/L — AB (ref 101–111)
CO2: 22 mmol/L (ref 22–32)
Calcium: 8.8 mg/dL — ABNORMAL LOW (ref 8.9–10.3)
Creatinine, Ser: 1.24 mg/dL — ABNORMAL HIGH (ref 0.44–1.00)
GFR calc Af Amer: 46 mL/min — ABNORMAL LOW (ref >60)
GFR calc non Af Amer: 40 mL/min — ABNORMAL LOW (ref >60)
GLUCOSE: 109 mg/dL — AB (ref 65–99)
POTASSIUM: 3.6 mmol/L (ref 3.5–5.1)
SODIUM: 144 mmol/L (ref 135–145)
TOTAL PROTEIN: 6.1 g/dL — AB (ref 6.5–8.1)

## 2015-07-17 LAB — CBC
HEMATOCRIT: 35.4 % — AB (ref 36.0–46.0)
HEMOGLOBIN: 11.5 g/dL — AB (ref 12.0–15.0)
MCH: 30.4 pg (ref 26.0–34.0)
MCHC: 32.5 g/dL (ref 30.0–36.0)
MCV: 93.7 fL (ref 78.0–100.0)
Platelets: 227 10*3/uL (ref 150–400)
RBC: 3.78 MIL/uL — ABNORMAL LOW (ref 3.87–5.11)
RDW: 13.7 % (ref 11.5–15.5)
WBC: 11 10*3/uL — ABNORMAL HIGH (ref 4.0–10.5)

## 2015-07-17 LAB — APTT
APTT: 104 s — AB (ref 24–37)
aPTT: 74 seconds — ABNORMAL HIGH (ref 24–37)

## 2015-07-17 LAB — HEPARIN LEVEL (UNFRACTIONATED)

## 2015-07-17 MED ORDER — BIOTENE DRY MOUTH MT LIQD
15.0000 mL | Freq: Two times a day (BID) | OROMUCOSAL | Status: DC
  Administered 2015-07-17 – 2015-07-18 (×3): 15 mL via OROMUCOSAL

## 2015-07-17 MED ORDER — DEXTROSE 5 % IV SOLN
1.0000 g | INTRAVENOUS | Status: DC
  Administered 2015-07-17 – 2015-07-18 (×2): 1 g via INTRAVENOUS
  Filled 2015-07-17 (×3): qty 10

## 2015-07-17 MED ORDER — DIPHENHYDRAMINE HCL 25 MG PO CAPS: 25.0000 mg | ORAL_CAPSULE | Freq: Once | ORAL | Status: DC

## 2015-07-17 MED ORDER — CHLORHEXIDINE GLUCONATE 0.12 % MT SOLN
15.0000 mL | Freq: Two times a day (BID) | OROMUCOSAL | Status: DC
  Administered 2015-07-17 – 2015-07-19 (×4): 15 mL via OROMUCOSAL
  Filled 2015-07-17 (×4): qty 15

## 2015-07-17 MED ORDER — CETYLPYRIDINIUM CHLORIDE 0.05 % MT LIQD
7.0000 mL | Freq: Two times a day (BID) | OROMUCOSAL | Status: DC
  Administered 2015-07-17 – 2015-07-18 (×3): 7 mL via OROMUCOSAL

## 2015-07-17 MED ORDER — ENOXAPARIN SODIUM 120 MG/0.8ML ~~LOC~~ SOLN
1.5000 mg/kg | SUBCUTANEOUS | Status: DC
  Administered 2015-07-17 – 2015-07-18 (×2): 105 mg via SUBCUTANEOUS
  Filled 2015-07-17 (×3): qty 0.71

## 2015-07-17 NOTE — Progress Notes (Signed)
ANTICOAGULATION CONSULT NOTE - Follow Up Consult  Pharmacy Consult for heparin Indication: atrial fibrillation and stroke  Labs:  Recent Labs  07/16/15 1356 07/17/15 0343  HGB 13.3 11.5*  HCT 40.8 35.4*  PLT 246 227  APTT  --  74*  HEPARINUNFRC  --  >2.20*  CREATININE 1.43* 1.24*    Assessment/Plan:  82yo female therapeutic on heparin with initial dosing while transitioning from Eliquis. Will continue gtt at current rate and confirm stable with additional PTT.   Wynona Neat, PharmD, BCPS  07/17/2015,5:06 AM

## 2015-07-17 NOTE — Progress Notes (Signed)
PROGRESS NOTE  Rose Sullivan  [id number] DOB: 08-20-1933 DOA: 07/16/2015 PCP: [name], MD  Brief Narrative:   Rose Sullivan is a 82 y.o. female with h/o paf on apixaban, legally blind, h/o breast cancer on aromasin, h/orecurrent cva last in 05/2015, At baseline she is aphasic and has left gaze from old stroke, but usually is receptive, alert, and follows commands.  She has had problems with her wake sleep cycle and was recently prescribed restoril.  After her first dose, she slept for 36 hours and has still not become fully alert three days later.  CT head no acute findings, cbc wnl, bmp with elevated bun/cr 44/1.43, total bilirubin 2.1, lactic acid wnl, ua with many bacteria, large leukocytes and positive nitrite, urine culture and blood culture drawn in the ED, she received rocephin x1, hospitalist called to admit the patient.  Assessment & Plan:   Active Problems:   Confusion   UTI (urinary tract infection)   Altered consciousness  Metabolic and toxic encephalopathy, likely secondary to UTI and restoril dose.   -  NPO and oral care -  Aspiration precautions -  Change all medications to IV -  Continue ceftriaxone -  F/u urine culture, growing E. coli -  Encouraged use of melatonin for sleep once patient more alert -  SLP eval:  Continue NPO for now  Dehydration  -  Continue IVF  PAF, sinus tach on telemetry but no significant arrhythmias -  D/c telemetry for comfort -  Continue IV metoprolol until tolerating PO  -  D/c heparin gtt and start therapeutic lovenox injections once daily   Recurrent CVA with aphasia, left gaze, residual weakness, suspect some vascular dementia at this point -  Oral medications on hold   Dysphagia secondary to strokes -  Was previously on dysphagia 1 diet prior to admission   DVT prophylaxis:  lovenox Code Status:  DNR Family Communication:  Patient and her daughter.   Disposition Plan:  Likely to home in care of family.   Advised that given her recurrent strokes and infections and her current level of debility, she will likely have recurrent infections and dehydration.  She may be able to have intermittent IVF and antibiotics at home with her new PCP who provides home services.  They also have the information for palliative care/hospice when they feel ready to pursue full comfort measures.  Needs to wake up and be able to eat before she can be discharged.  If she remains unresponsive, may need to discuss hospice care more immediately.    Consultants:   none  Procedures:  none  Antimicrobials:   Ceftriaxone  6/18   Subjective:  Patient asleep and not able to answer questions  Objective: Filed Vitals:   07/17/15 0647 07/17/15 0840 07/17/15 1000 07/17/15 1516  BP: 126/90 116/84  174/98  Pulse: 80 75  70  Temp: 97.8 F (36.6 C) 98.2 F (36.8 C)    TempSrc: Oral     Resp: 16 16    Height:   5' (1.524 m)   Weight:      SpO2: 97%       Intake/Output Summary (Last 24 hours) at 07/17/15 1739 Last data filed at 07/17/15 1427  Gross per 24 hour  Intake  956.5 ml  Output      3 ml  Net  953.5 ml   Filed Weights   07/16/15 2017  Weight: 71.396 kg (157 lb 6.4 oz)    Examination:  General exam:  Adult female, asleep with mouth gaping open and only grimaces when we attempt to arouse her.  No acute distress.  HEENT:  NCAT, dry MM with copious dry secretions in mouth Respiratory system: Clear to auscultation bilaterally Cardiovascular system: Regular rate and rhythm, normal S1/S2. No murmurs, rubs, gallops or clicks.  Warm extremities Gastrointestinal system: Normal active bowel sounds, soft, nondistended, nontender. MSK:  Normal tone and bulk, no lower extremity edema Neuro:  Not following commands.  Grimaces and moves right extremities slightly to stimuli.      Data Reviewed: I have personally reviewed following labs and imaging studies  CBC:  Recent Labs Lab 07/16/15 1356 07/17/15 0343   WBC 9.1 11.0*  NEUTROABS 6.6  --   HGB 13.3 11.5*  HCT 40.8 35.4*  MCV 94.7 93.7  PLT 246 [id number]   Basic Metabolic Panel:  Recent Labs Lab 07/16/15 1356 07/17/15 0343  NA 142 144  K 4.2 3.6  CL 112* 113*  CO2 20* 22  GLUCOSE 83 109*  BUN 44* 40*  CREATININE 1.43* 1.24*  CALCIUM 9.2 8.8*   GFR: Estimated Creatinine Clearance: 31.9 mL/min (by C-G formula based on Cr of 1.24). Liver Function Tests:  Recent Labs Lab 07/16/15 1356 07/17/15 0343  AST 39 33  ALT 26 23  ALKPHOS 64 54  BILITOT 2.1* 1.8*  PROT 6.8 6.1*  ALBUMIN 3.7 3.1*   No results for input(s): LIPASE, AMYLASE in the last 168 hours. No results for input(s): AMMONIA in the last 168 hours. Coagulation Profile: No results for input(s): INR, PROTIME in the last 168 hours. Cardiac Enzymes: No results for input(s): CKTOTAL, CKMB, CKMBINDEX, TROPONINI in the last 168 hours. BNP (last 3 results) No results for input(s): PROBNP in the last 8760 hours. HbA1C: No results for input(s): HGBA1C in the last 72 hours. CBG: No results for input(s): GLUCAP in the last 168 hours. Lipid Profile: No results for input(s): CHOL, HDL, LDLCALC, TRIG, CHOLHDL, LDLDIRECT in the last 72 hours. Thyroid Function Tests: No results for input(s): TSH, T4TOTAL, FREET4, T3FREE, THYROIDAB in the last 72 hours. Anemia Panel: No results for input(s): VITAMINB12, FOLATE, FERRITIN, TIBC, IRON, RETICCTPCT in the last 72 hours. Urine analysis:    Component Value Date/Time   COLORURINE AMBER* 07/16/2015 1623   APPEARANCEUR TURBID* 07/16/2015 1623   LABSPEC 1.026 07/16/2015 1623   PHURINE 5.5 07/16/2015 1623   GLUCOSEU NEGATIVE 07/16/2015 1623   GLUCOSEU NEGATIVE 06/10/2015 1631   HGBUR MODERATE* 07/16/2015 1623   BILIRUBINUR SMALL* 07/16/2015 1623   BILIRUBINUR negative 06/14/2015 1403   KETONESUR 15* 07/16/2015 1623   PROTEINUR 30* 07/16/2015 1623   PROTEINUR 2+ 06/14/2015 1403   UROBILINOGEN negative 06/14/2015 1403    UROBILINOGEN 1.0 06/10/2015 1631   NITRITE POSITIVE* 07/16/2015 1623   NITRITE negative 06/14/2015 1403   LEUKOCYTESUR LARGE* 07/16/2015 1623   Sepsis Labs: @LABRCNTIP (procalcitonin:4,lacticidven:4)  ) Recent Results (from the past 240 hour(s))  Urine culture     Status: Abnormal (Preliminary result)   Collection Time: 07/16/15  4:23 PM  Result Value Ref Range Status   Specimen Description URINE, CATHETERIZED  Final   Special Requests NONE  Final   Culture >=100,000 COLONIES/mL ESCHERICHIA COLI (A)  Final   Report Status PENDING  Incomplete  Blood culture (routine x 2)     Status: None (Preliminary result)   Collection Time: 07/16/15  6:02 PM  Result Value Ref Range Status   Specimen Description BLOOD RIGHT HAND  Final   Special  Requests IN PEDIATRIC BOTTLE 1CC  Final   Culture NO GROWTH < 24 HOURS  Final   Report Status PENDING  Incomplete  Blood culture (routine x 2)     Status: None (Preliminary result)   Collection Time: 07/16/15  6:09 PM  Result Value Ref Range Status   Specimen Description BLOOD RIGHT WRIST  Final   Special Requests IN PEDIATRIC BOTTLE 3CC  Final   Culture NO GROWTH < 24 HOURS  Final   Report Status PENDING  Incomplete      Radiology Studies: Ct Head Wo Contrast  07/16/2015  CLINICAL DATA:  Altered mental status. EXAM: CT HEAD WITHOUT CONTRAST TECHNIQUE: Contiguous axial images were obtained from the base of the skull through the vertex without intravenous contrast. COMPARISON:  MRI and head CT, 06/18/2015. FINDINGS: The ventricles are normal configuration. There is ventricular and sulcal enlargement reflecting moderate atrophy, stable from prior exam. There are no parenchymal masses or mass effect. There is no evidence of a recent cortical infarct. Patchy white matter hypoattenuation is noted consistent with advanced chronic microvascular ischemic change. Chronic small left occipital infarct has evolved since the prior imaging. There are no extra-axial  masses or abnormal fluid collections. There is no intracranial hemorrhage. The visualized sinuses and mastoid air cells are clear. IMPRESSION: 1. No acute intracranial abnormalities. 2. Moderate atrophy and advanced chronic microvascular ischemic change stable from the prior CT. Old left occipital infarct. Electronically Signed   By: Lajean Manes M.D.   On: 07/16/2015 17:30   Dg Chest Portable 1 View  07/16/2015  CLINICAL DATA:  Altered mental status. EXAM: PORTABLE CHEST 1 VIEW COMPARISON:  06/18/2015 FINDINGS: Patient slightly rotated to the left. Lungs are adequately inflated with possible left base/ retrocardiac opacification which may be due to small effusion and atelectasis although cannot exclude infection. Borderline cardiomegaly. Minimal calcified plaque over the aortic arch. There are degenerative changes of the spine. Slight progression a fairly well-defined 2.5 cm sclerotic focus over the left humeral head/ neck. Mild sclerosis of right humeral neck without significant change. IMPRESSION: Mild left base opacification which may be due to small effusion with atelectasis, although cannot exclude infection. Possible slight progression a well-defined 2.5 cm sclerotic focus over the left humeral head/ neck. Stable mild sclerosis of the right humeral neck. Findings possibly due to bone infarcts, although cannot exclude metastatic disease. Recommend clinical correlation as dedicated right shoulder films may be helpful for further evaluation. Electronically Signed   By: Marin Olp M.D.   On: 07/16/2015 16:34     Scheduled Meds: . antiseptic oral rinse  15 mL Mouth Rinse BID  . antiseptic oral rinse  7 mL Mouth Rinse q12n4p  . cefTRIAXone (ROCEPHIN)  IV  1 g Intravenous Once  . chlorhexidine  15 mL Mouth Rinse BID  . metoprolol  2.5 mg Intravenous Q8H  . sodium chloride flush  3 mL Intravenous Q12H   Continuous Infusions: . dextrose 5 % and 0.9% NaCl 75 mL/hr at 07/17/15 1519  . heparin 700  Units/hr (07/17/15 1516)     LOS: 1 day    Time spent: 30 min    Janece Canterbury, MD [facility] Pager [phone]  If 7PM-7AM, please contact night-coverage [email or web address] Password [facility] 07/17/2015, 5:39 PM

## 2015-07-17 NOTE — Progress Notes (Signed)
New Admission Note:   Arrival Method: Via stretcher from ED Mental Orientation:  Unable to assess - opens eyes to voice Telemetry:   Placed on Tele #[id number] Assessment: Completed Skin:  See skin assessment under flowsheet IV:  20 gauge left hand Pain: None Tubes:  None Safety Measures: Safety Fall Prevention Plan has been given, discussed and signed Admission: Completed [address] [place] Orientation: Patient has been orientated to the room, unit and staff.  Family:  Daughter at bedside  Patient is from home with daughter who is her primary caregiver.  She receives PT, NT, and RN from [place].    Orders have been reviewed and implemented. Will continue to monitor the patient. Call light has been placed within reach and bed alarm has been activated.   Earleen Reaper RN- [place], [place] Phone number: [phone]

## 2015-07-17 NOTE — Progress Notes (Signed)
ANTICOAGULATION CONSULT NOTE - Follow Up Consult  Pharmacy Consult for heparin Indication: atrial fibrillation and hx of CVA  Allergies  Allergen Reactions  . Temazepam Other (See Comments)    30mg  dose is too strong, brought her to hospital 07/16/15-per patient  . Trazodone And Nefazodone Other (See Comments)    hallucinations    Patient Measurements: Height: 5' (152.4 cm) Weight: 157 lb 6.4 oz (71.396 kg) IBW/kg (Calculated) : 45.5 Heparin Dosing Weight: 71.4 kg  Vital Signs: Temp: 97.8 F (36.6 C) (06/18 0647) Temp Source: Oral (06/18 0647) BP: 126/90 mmHg (06/18 0647) Pulse Rate: 80 (06/18 0647)  Labs:  Recent Labs  07/16/15 1356 07/17/15 0343 07/17/15 0910  HGB 13.3 11.5*  --   HCT 40.8 35.4*  --   PLT 246 227  --   APTT  --  74* 104*  HEPARINUNFRC  --  >2.20*  --   CREATININE 1.43* 1.24*  --     Estimated Creatinine Clearance: 31.9 mL/min (by C-G formula based on Cr of 1.24).   Medications:  Prescriptions prior to admission  Medication Sig Dispense Refill Last Dose  . amantadine (SYMMETREL) 50 MG/5ML solution 100mg  bid taking bid at 8am and 2pm. (Patient taking differently: Take 100 mg by mouth 2 (two) times daily. 100mg  bid taking bid at 8am and 2pm.) 473 mL 3 07/16/2015 at Unknown time  . amiodarone (PACERONE) 200 MG tablet Take 1 tablet (200 mg total) by mouth daily. 90 tablet 3 Past Week at Unknown time  . apixaban (ELIQUIS) 5 MG TABS tablet Take 1 tablet (5 mg total) by mouth 2 (two) times daily. 60 tablet 0 Past Week at Unknown time  . Diclofenac Sodium 1.5 % SOLN Place 2 mLs onto the skin 3 (three) times daily. 3 Bottle 3 Past Week at Unknown time  . exemestane (AROMASIN) 25 MG tablet TAKE 1 TABLET BY MOUTH EVERY DAY 30 tablet 1 Past Week at Unknown time  . metoprolol tartrate (LOPRESSOR) 25 MG tablet Take 0.5 tablets (12.5 mg total) by mouth 2 (two) times daily. 60 tablet 1 Past Week at Unknown time  . naproxen sodium (ANAPROX) 220 MG tablet Take 220  mg by mouth 2 (two) times daily as needed (for pain).   07/16/2015 at Unknown time  . Pramoxine-Camphor-Zinc Acetate (ANTI ITCH EX) Apply 1 application topically as needed (for itching).   Past Week at Unknown time  . pravastatin (PRAVACHOL) 40 MG tablet TAKE 1 TABLET BY MOUTH EVERY DAY AT 6 PM 30 tablet 0 Past Week at Unknown time  . temazepam (RESTORIL) 30 MG capsule TAKE ONE CAPSULE BY MOUTH AT BEDTIME AS NEEDED FOR SLEEP 90 capsule 1 Past Week at Unknown time  . oxyCODONE-acetaminophen (PERCOCET) 7.5-325 MG tablet Take 1 tablet by mouth every 8 (eight) hours as needed. 120 tablet 0 Taking   Scheduled:  . antiseptic oral rinse  15 mL Mouth Rinse BID  . cefTRIAXone (ROCEPHIN)  IV  1 g Intravenous Once  . metoprolol  2.5 mg Intravenous Q8H  . sodium chloride flush  3 mL Intravenous Q12H    Assessment: 82 YOF who [facility] on [facility]17 with over-sedation in the setting of recent restoril use. The patient has not taken any meds, food, or drink since Thurs, 6/15. PTA - the patient was on Apixaban for new CVA in Feb '17 with recurrent CVA in March '17 d/t underdosing of apixaban. Pharmacy now consulted to bridge with heparin while the patient is NPO.  Head CT this admit is negative for bleed or new CVA. Hep wt: 62 kg, baseline CBC wnl.  APTT slightly elevated to 104 on 800 units/hr.  Goal of Therapy:  Heparin level 0.3-0.7 units/ml aPTT 66-102 seconds Monitor platelets by anticoagulation protocol: Yes   Plan:  Decrease heparin to 700 units/hr Follow up 8 hour aptt Daily HL/aptt Monitor for s/sx of bleeding  Joya San, PharmD Clinical Pharmacy Resident Pager # [phone] 07/17/2015 12:38 PM

## 2015-07-17 NOTE — Progress Notes (Signed)
[facility]  Patient Status: Active with visits up until hospitalization  [facility] is providing the following services: SN, ST, OT, HA  If patient discharges after hours, please call [phone].   Rose Sullivan 07/17/2015, 11:52 AM

## 2015-07-17 NOTE — Evaluation (Signed)
Clinical/Bedside Swallow Evaluation Patient Details  Name: Rose Sullivan MRN: [id number] Date of Birth: 04/03/1933  Today's Date: 07/17/2015 Time: SLP Start Time (ACUTE ONLY): 64 SLP Stop Time (ACUTE ONLY): 1241 SLP Time Calculation (min) (ACUTE ONLY): 15 min  Past Medical History:  Past Medical History  Diagnosis Date  . HYPERLIPIDEMIA   . BLINDNESS, [place], [place] DEFINITION 11/25/2006  . HYPERTENSION   . PAF (paroxysmal atrial fibrillation) ([place])     a. started on amio 05/2015 by IM.  Marland Kitchen GERD 02/11/2007  . LOW BACK PAIN, CHRONIC 11/25/2006  . BACK PAIN 12/10/2007  . Pain in Soft Tissues of Limb 02/11/2007  . OSTEOPENIA 02/11/2007  . GAIT DISTURBANCE 12/10/2007  . Abdominal pain, left lower quadrant 04/01/2007  . BREAST CANCER, HX OF 11/25/2006  . Nocturia 05/18/2010  . Hypersomnia 05/18/2010  . Impaired glucose tolerance     a. hx of diet controlled DM per chart.  . Allergic rhinitis, cause unspecified 07/23/2012  . Anxiety state, unspecified 02/04/2013  . Cancer ([place])   . CKD (chronic kidney disease), stage II     CKD II-III per labs  . Atrial flutter ([place])     a. s/p remote ablation.  . SVT (supraventricular tachycardia) ([place])   . Obesity   . Shortness of breath   . Stroke [facility])     a. Stroke 03/2015 with readmission 03/2015 for extension of stroke felt 2/2 hypotension from UTI.  b. Adm 05/2015 for encephalopathy encephalopathy secondary to left occipital stroke extension (not afib cardioembolic pattern, more likely related to hypotension from dehydration/UTI).   Past Surgical History:  Past Surgical History  Procedure Laterality Date  . Dilation and curettage of uterus    . S/p mastectomy Right   . Appendectomy    . Cataract extraction    . Knee surgery Right    HPI:  54 female with PMH paf, breast ca, recurrent CVA 05/2015 with residual aphasia and left-sided gaze preference, who was brought to ED due to decreased responsiveness. Found to have UTI. PTA she has been on Dys 1  diet and thin liquids by cup.    Assessment / Plan / Recommendation Clinical Impression  Pt's mentation does not allow for safe PO intake at this time, due to poor awareness for bolus acceptance and manipulation with overall passive oral phase resulting in immediate, weak coughing from small amounts of thin liquid/ice. Recommend to continue NPO with focus on agressive oral care as pt has an open-mouth posture for breathing. Will continue to follow for hopeful diet initiation as mentation continues to improve from UTI.    Aspiration Risk  Severe aspiration risk    Diet Recommendation NPO   Medication Administration: Via alternative means    Other  Recommendations Oral Care Recommendations: Oral care QID Other Recommendations: Have oral suction available   Follow up Recommendations  Home health SLP;24 hour supervision/assistance    Frequency and Duration min 2x/week  2 weeks       Prognosis Prognosis for Safe Diet Advancement: Good Barriers to Reach Goals: Cognitive deficits;Language deficits;Severity of deficits      Swallow Study   General HPI: 24 female with PMH paf, breast ca, recurrent CVA 05/2015 with residual aphasia and left-sided gaze preference, who was brought to ED due to decreased responsiveness. Found to have UTI. PTA she has been on Dys 1 diet and thin liquids by cup.  Type of Study: Bedside Swallow Evaluation Previous Swallow Assessment: most recent MBS in May 2015 recommending DYs  1 diet and thin liquids by cup Diet Prior to this Study: NPO Temperature Spikes Noted: No Respiratory Status: Room air History of Recent Intubation: No Behavior/Cognition: Alert;Requires cueing Oral Cavity Assessment: Dry;Dried secretions Oral Care Completed by SLP: Recent completion by staff Oral Cavity - Dentition: Poor condition Self-Feeding Abilities: Total assist Patient Positioning: Upright in bed Baseline Vocal Quality: Not observed    Oral/Motor/Sensory Function Overall Oral  Motor/Sensory Function:  (baseline impairment)   Ice Chips Ice chips: Impaired Presentation: Spoon Oral Phase Impairments: Poor awareness of bolus;Reduced lingual movement/coordination Oral Phase Functional Implications: Other (comment) (tries to spit it out, passive acceptance)   Thin Liquid Thin Liquid: Impaired Presentation: Spoon Oral Phase Impairments: Reduced labial seal;Reduced lingual movement/coordination;Poor awareness of bolus Oral Phase Functional Implications: Other (comment) (passive oral phase) Pharyngeal  Phase Impairments: Suspected delayed Swallow;Cough - Immediate    Nectar Thick Nectar Thick Liquid: Not tested   Honey Thick Honey Thick Liquid: Not tested   Puree Puree: Not tested   Solid   GO   Solid: Not tested        Germain Osgood, M.A. CCC-SLP [phone]  Germain Osgood 07/17/2015,2:02 PM

## 2015-07-17 NOTE — Progress Notes (Signed)
ANTICOAGULATION CONSULT NOTE - Follow Up Consult  Pharmacy Consult for Heparin >> Lovenox Indication: atrial fibrillation and stroke  Allergies  Allergen Reactions  . Temazepam Other (See Comments)    30mg  dose is too strong, brought her to hospital 07/16/15-per patient  . Trazodone And Nefazodone Other (See Comments)    hallucinations    Patient Measurements: Height: 5' (152.4 cm) Weight: 157 lb 6.4 oz (71.396 kg) IBW/kg (Calculated) : 45.5  Vital Signs: Temp: 98.2 F (36.8 C) (06/18 0840) Temp Source: Oral (06/18 0647) BP: 174/98 mmHg (06/18 1516) Pulse Rate: 70 (06/18 1516)  Labs:  Recent Labs  07/16/15 1356 07/17/15 0343 07/17/15 0910  HGB 13.3 11.5*  --   HCT 40.8 35.4*  --   PLT 246 227  --   APTT  --  74* 104*  HEPARINUNFRC  --  >2.20*  --   CREATININE 1.43* 1.24*  --     Estimated Creatinine Clearance: 31.9 mL/min (by C-G formula based on Cr of 1.24).   Assessment: 61 YOF who presented to the [facility] on 6/17 with over-sedation in the setting of recent restoril use. The patient has not taken any meds, food, or drink since Thurs, 6/15. PTA - the patient was on Apixaban for new CVA in Feb '17 with recurrent CVA in March '17 d/t underdosing of apixaban. Pharmacy originally consulted to bridge with heparin while the patient is NPO and now to transition to lovenox to minimize injections and labs for patient comfort.   MD requested once a day lovenox dosing. Since the patient's CrCl>30 ml/min - the recommended dosing for Afib is 1 mg/kg q12h. Dosing of 1.5 mg/kg q24h has only been recommended in PE/DVT patients. Discussed this with MD (Short) who wants to continue with 1.5 mg/kg/day dosing for patient comfort. SCr 1.24, CrCl~35-40 ml/min. Hgb 11.5, plts wnl. No bleeding noted per RN.   Goal of Therapy:  Anti-Xa level 0.6-1 units/ml 4hrs after LMWH dose given Monitor platelets by anticoagulation protocol: Yes   Plan:  1. Heparin discontinued 2. Will start Lovenox 105  mg (~1.5 mg/kg) starting at 2000 3. Will continue to monitor for any signs/symptoms of bleeding and renal function for any necessary dose adjustments.   Alycia Rossetti, PharmD, BCPS Clinical Pharmacist Pager: [phone] 07/17/2015 6:20 PM

## 2015-07-18 ENCOUNTER — Inpatient Hospital Stay (HOSPITAL_COMMUNITY): Payer: Medicare Other

## 2015-07-18 DIAGNOSIS — R410 Disorientation, unspecified: Secondary | ICD-10-CM

## 2015-07-18 DIAGNOSIS — N3000 Acute cystitis without hematuria: Secondary | ICD-10-CM

## 2015-07-18 DIAGNOSIS — R404 Transient alteration of awareness: Secondary | ICD-10-CM

## 2015-07-18 LAB — URINE CULTURE: Culture: >=100000 — AB

## 2015-07-18 LAB — CBC
HEMATOCRIT: 33.6 % — AB (ref 36.0–46.0)
HEMOGLOBIN: 11 g/dL — AB (ref 12.0–15.0)
MCH: 30.6 pg (ref 26.0–34.0)
MCHC: 32.7 g/dL (ref 30.0–36.0)
MCV: 93.6 fL (ref 78.0–100.0)
Platelets: 214 10*3/uL (ref 150–400)
RBC: 3.59 MIL/uL — ABNORMAL LOW (ref 3.87–5.11)
RDW: 13.6 % (ref 11.5–15.5)
WBC: 8.1 10*3/uL (ref 4.0–10.5)

## 2015-07-18 MED ORDER — HYDRALAZINE HCL 20 MG/ML IJ SOLN
5.0000 mg | INTRAMUSCULAR | Status: DC | PRN
  Administered 2015-07-18: 5 mg via INTRAVENOUS
  Filled 2015-07-18: qty 1

## 2015-07-18 MED ORDER — CAMPHOR-MENTHOL 0.5-0.5 % EX LOTN
TOPICAL_LOTION | CUTANEOUS | Status: DC | PRN
  Administered 2015-07-18: 17:00:00 via TOPICAL
  Filled 2015-07-18: qty 222

## 2015-07-18 NOTE — Progress Notes (Signed)
Initial Nutrition Assessment  DOCUMENTATION CODES:   Obesity unspecified  INTERVENTION:  Diet advancement as appropriate per MD.  RD to continue to monitor.   NUTRITION DIAGNOSIS:   Inadequate oral intake related to inability to eat as evidenced by NPO status.  GOAL:   Patient will meet greater than or equal to 90% of their needs  MONITOR:   Weight trends, Labs, I & O's, Diet advancement  REASON FOR ASSESSMENT:   Low [place], Malnutrition Screening Tool    ASSESSMENT:   82 y.o. female With h/o paf on apixaban, h/o breast cancer on aromasin, h/o recurrent cva last in 05/2015, At baseline she is aphasic and has left gaze from old stroke, but usually is receptive, alert, and follows commands is brought to [facility] [place] by EMS due to not responding, per daughter patient has been sleeping since she took restoril on Thursday, patient Responds only to pain with grimacing and pulling away, patient has not been eating either.  Pt lethargic during time of visit. Daughter at bedside. She reports pt with no po intake since last Thursday 6/15. Pt usually consumes a dysphagia 1 diet with with thin liquids PTA. Daughter reports pt usually consumes 3 meals a day. Usual body weight reported to be ~165 lbs. Per Epic weight records, pt with a 16.2% weight loss in 4 months. Pt is currently NPO due to lethargy. Plans for MBS. Noted palliative care has been consulted. RD to continue to monitor.   Pt with no observed significant fat or muscle mass loss.   Labs and medications reviewed.   Diet Order:  Diet NPO time specified  Skin:  Reviewed, no issues  Last BM:  Unknown  Height:   Ht Readings from Last 1 Encounters:  07/17/15 5' (1.524 m)    Weight:   Wt Readings from Last 1 Encounters:  07/17/15 160 lb 1.6 oz (72.621 kg)    Ideal Body Weight:  45.45 kg  BMI:  Body mass index is 31.27 kg/(m^2).  Estimated Nutritional Needs:   Kcal:  1500-1800  Protein:  70-80 grams  Fluid:   1.5 - 1.8 L/day  EDUCATION NEEDS:   No education needs identified at this time  Corrin Parker, MS, RD, LDN Pager # [phone] After hours/ weekend pager # [phone]

## 2015-07-18 NOTE — Progress Notes (Signed)
Speech Language Pathology Treatment: Dysphagia  Patient Details Name: Rose Sullivan MRN: [id number] DOB: Mar 07, 1933 Today's Date: 07/18/2015 Time: [id number] SLP Time Calculation (min) (ACUTE ONLY): 15 min  Assessment / Plan / Recommendation Clinical Impression  RN paged and pt now awake. Pt attempted to verbalize in response to questions without ability to phonate (suspect due to poor respiratory support). Laryngeal elevation significantly decreased and swallow appeared discoordinated. Recommend MBS next date (pt scheduled for outpt MBS soon per daughter). Ice chips only following oral care. Educated/discussed with daughter typical decline of swallow function with pt's having dementia. Daughter interested in Palliative care consult (text paged MD).    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 Found to have UTI. PTA she has been on Dys 1 diet and thin liquids by cup.       SLP Plan  MBS     Recommendations  Diet recommendations:  (ic chips)             Oral Care Recommendations: Oral care QID Follow up Recommendations: Home health SLP;24 hour supervision/assistance Plan: MBS     GO                Houston Siren 07/18/2015, 3:26 PM   Orbie Pyo Colvin Caroli[employer]Ed [employer] [phone]

## 2015-07-18 NOTE — Progress Notes (Signed)
PROGRESS NOTE  Rose Sullivan  [id number] DOB: [birth year] DOA: 07/16/2015 PCP: [name], MD  Brief Narrative:  82 y.o. female with h/o paf on apixaban, legally blind, h/o breast cancer on aromasin, h/orecurrent cva last in 05/2015, At baseline she is aphasic and has left gaze from old stroke, but usually is receptive, alert, and follows commands.  She has had problems with her wake sleep cycle and was recently prescribed restoril.  After her first dose, she slept for 36 hours and has still not become fully alert three days later.  CT head no acute findings, UA c/w acute UTI. TRH asked to admit for further evaluation.   Assessment & Plan:   Metabolic and toxic encephalopathy, likely secondary to UTI and restoril dose.   - Still lethargic and difficult to awake, not opening eyes  - Continue with aspiration precautions - Continue ceftriaxone day #3 and continue to monitor clinical response  - F/u urine culture, growing E. Coli, final report pending  - d/w daughter findings of recent MRI and diffuse global atrophy with evidence of encephalomalacia/laminar necrosis, we have discussed meaning of these words and how they correlate with clinical findings and rather guarded prognosis, unpredictable course of alert, awake, lethargic cycles - daughter questioned reversibility of these findings and I have explained this is unlikely and we have therefore discussed trying to focus on other medical efforts that would focus on quality of life as it is very clear from daughter's speech that mother appreciates comfort in life and would trade few years of her life to ensure quality - SLP pending for today but daughter aware if the pt is not responding, palliative services can help [place] ensuring we follow pt's wishes - daughter in agreement    Dehydration  - evidenced by elevated BUN and Cr, dry MM - still dry MM - continue with supportive care - BMP in AM  Acute on chronic renal disease stage III  -  with baseline Cr 1.2 - 1.3 and GFR as low as 30- 40 in the past 4 months  - BMP in AM  PAF, CHADS2 score 5-6 - D/C telemetry for comfort - Continue IV metoprolol until tolerating PO  - D/c heparin gtt, continue with lovenox injections once daily   Recurrent CVA with aphasia, left gaze, residual weakness - unable to restart oral meds yet - repeat MRI brain pending - if recurrent strokes, would try to focus on comfort, this was discussed with daughter in detail   Dysphagia secondary to strokes - Was previously on dysphagia 1 diet prior to admission - SLP pending   Accelerated HTN - continue Hydralazine as needed for now  Obesity  - Body mass index is 31.27   DVT prophylaxis:  lovenox Code Status:  DNR Family Communication:  Daughter at bedside.    Disposition Plan:  Home in 1-2 days.   Consultants:   PCT  Procedures:   None  Antimicrobials:   Ceftriaxone  6/18  Subjective:  Patient asleep and not able to answer questions  Objective: Filed Vitals:   07/17/15 1516 07/17/15 1839 07/17/15 2129 07/18/15 0540  BP: 174/98 130/81 152/75 166/74  Pulse: 70 76 66 69  Temp:  97.8 F (36.6 C) 97.2 F (36.2 C) 98.4 F (36.9 C)  TempSrc:  Oral Oral Oral  Resp:  18 18 19   Height:      Weight:   72.621 kg (160 lb 1.6 oz)   SpO2:  95% 98% 97%  Intake/Output Summary (Last 24 hours) at 07/18/15 0700 Last data filed at 07/18/15 [id number]  Gross per 24 hour  Intake 906.25 ml  Output      3 ml  Net 903.25 ml   Filed Weights   07/16/15 2017 07/17/15 2129  Weight: 71.396 kg (157 lb 6.4 oz) 72.621 kg (160 lb 1.6 oz)    Examination:  General exam:  Adult female, asleep with mouth gaping open and only grimaces when we attempt to arouse her.   HEENT:  NCAT, dry MM with copious dry secretions in mouth Respiratory system: Diminished breath sounds at bases Cardiovascular system: IRRR, no rubs, gallops or clicks.  Warm extremities Gastrointestinal system: Normal active bowel  sounds, soft, nondistended, nontender. MSK:  Normal tone and bulk, nRegular o lower extremity edema Neuro:  Not following commands.  Not moving upper or lower extremities.   Data Reviewed: I have personally reviewed following labs and imaging studies  CBC:  Recent Labs Lab 07/16/15 1356 07/17/15 0343 07/18/15 0519  WBC 9.1 11.0* 8.1  NEUTROABS 6.6  --   --   HGB 13.3 11.5* 11.0*  HCT 40.8 35.4* 33.6*  MCV 94.7 93.7 93.6  PLT 246 227 [id number]   Basic Metabolic Panel:  Recent Labs Lab 07/16/15 1356 07/17/15 0343  NA 142 144  K 4.2 3.6  CL 112* 113*  CO2 20* 22  GLUCOSE 83 109*  BUN 44* 40*  CREATININE 1.43* 1.24*  CALCIUM 9.2 8.8*   Liver Function Tests:  Recent Labs Lab 07/16/15 1356 07/17/15 0343  AST 39 33  ALT 26 23  ALKPHOS 64 54  BILITOT 2.1* 1.8*  PROT 6.8 6.1*  ALBUMIN 3.7 3.1*   Urine analysis:    Component Value Date/Time   COLORURINE AMBER* 07/16/2015 1623   APPEARANCEUR TURBID* 07/16/2015 1623   LABSPEC 1.026 07/16/2015 1623   PHURINE 5.5 07/16/2015 1623   GLUCOSEU NEGATIVE 07/16/2015 1623   GLUCOSEU NEGATIVE 06/10/2015 1631   HGBUR MODERATE* 07/16/2015 1623   BILIRUBINUR SMALL* 07/16/2015 1623   BILIRUBINUR negative 06/14/2015 1403   KETONESUR 15* 07/16/2015 1623   PROTEINUR 30* 07/16/2015 1623   PROTEINUR 2+ 06/14/2015 1403   UROBILINOGEN negative 06/14/2015 1403   UROBILINOGEN 1.0 06/10/2015 1631   NITRITE POSITIVE* 07/16/2015 1623   NITRITE negative 06/14/2015 1403   LEUKOCYTESUR LARGE* 07/16/2015 1623   Recent Results (from the past 240 hour(s))  Urine culture     Status: Abnormal (Preliminary result)   Collection Time: 07/16/15  4:23 PM  Result Value Ref Range Status   Specimen Description URINE, CATHETERIZED  Final   Special Requests NONE  Final   Culture >=100,000 COLONIES/mL ESCHERICHIA COLI (A)  Final   Report Status PENDING  Incomplete  Blood culture (routine x 2)     Status: None (Preliminary result)   Collection Time:  07/16/15  6:02 PM  Result Value Ref Range Status   Specimen Description BLOOD RIGHT HAND  Final   Special Requests IN PEDIATRIC BOTTLE 1CC  Final   Culture NO GROWTH < 24 HOURS  Final   Report Status PENDING  Incomplete  Blood culture (routine x 2)     Status: None (Preliminary result)   Collection Time: 07/16/15  6:09 PM  Result Value Ref Range Status   Specimen Description BLOOD RIGHT WRIST  Final   Special Requests IN PEDIATRIC BOTTLE 3CC  Final   Culture NO GROWTH < 24 HOURS  Final   Report Status PENDING  Incomplete  Radiology Studies: Ct Head Wo Contrast  07/16/2015  CLINICAL DATA:  Altered mental status. EXAM: CT HEAD WITHOUT CONTRAST TECHNIQUE: Contiguous axial images were obtained from the base of the skull through the vertex without intravenous contrast. COMPARISON:  MRI and head CT, 06/18/2015. FINDINGS: The ventricles are normal configuration. There is ventricular and sulcal enlargement reflecting moderate atrophy, stable from prior exam. There are no parenchymal masses or mass effect. There is no evidence of a recent cortical infarct. Patchy white matter hypoattenuation is noted consistent with advanced chronic microvascular ischemic change. Chronic small left occipital infarct has evolved since the prior imaging. There are no extra-axial masses or abnormal fluid collections. There is no intracranial hemorrhage. The visualized sinuses and mastoid air cells are clear. IMPRESSION: 1. No acute intracranial abnormalities. 2. Moderate atrophy and advanced chronic microvascular ischemic change stable from the prior CT. Old left occipital infarct. Electronically Signed   By: Lajean Manes M.D.   On: 07/16/2015 17:30   Dg Chest Portable 1 View  07/16/2015  CLINICAL DATA:  Altered mental status. EXAM: PORTABLE CHEST 1 VIEW COMPARISON:  06/18/2015 FINDINGS: Patient slightly rotated to the left. Lungs are adequately inflated with possible left base/ retrocardiac opacification which may be  due to small effusion and atelectasis although cannot exclude infection. Borderline cardiomegaly. Minimal calcified plaque over the aortic arch. There are degenerative changes of the spine. Slight progression a fairly well-defined 2.5 cm sclerotic focus over the left humeral head/ neck. Mild sclerosis of right humeral neck without significant change. IMPRESSION: Mild left base opacification which may be due to small effusion with atelectasis, although cannot exclude infection. Possible slight progression a well-defined 2.5 cm sclerotic focus over the left humeral head/ neck. Stable mild sclerosis of the right humeral neck. Findings possibly due to bone infarcts, although cannot exclude metastatic disease. Recommend clinical correlation as dedicated right shoulder films may be helpful for further evaluation. Electronically Signed   By: Marin Olp M.D.   On: 07/16/2015 16:34     Scheduled Meds: . antiseptic oral rinse  15 mL Mouth Rinse BID  . antiseptic oral rinse  7 mL Mouth Rinse q12n4p  . cefTRIAXone (ROCEPHIN)  IV  1 g Intravenous Q24H  . chlorhexidine  15 mL Mouth Rinse BID  . diphenhydrAMINE  25 mg Oral Once  . enoxaparin (LOVENOX) injection  1.5 mg/kg Subcutaneous Q24H  . metoprolol  2.5 mg Intravenous Q8H  . sodium chloride flush  3 mL Intravenous Q12H   Continuous Infusions: . dextrose 5 % and 0.9% NaCl 75 mL/hr at 07/18/15 0509     LOS: 2 days    Time spent: 30 min    Faye Ramsay, MD [facility] Pager [phone]  If 7PM-7AM, please contact night-coverage [email or web address] Password [email or web address] 07/18/2015, 7:00 AM

## 2015-07-18 NOTE — Progress Notes (Signed)
Speech Language Pathology Treatment: Dysphagia  Patient Details Name: Rose Sullivan MRN: [id number] DOB: 1933/07/24 Today's Date: 07/18/2015 Time: 0912-0932 SLP Time Calculation (min) (ACUTE ONLY): 20 min  Assessment / Plan / Recommendation Clinical Impression  Pt lethargic, open mouth posture at rest with copious secretions lips/hard and soft palate. SLP provided intense oral care to remove majority of oral secretions. Answered daughters questions and educated clinical reasoning for xerostomia, status of swallow and effects on secretions. Daughter stated pt alert last night. RN to page if pt becomes alert while ST here today.    HPI HPI: 65 female with PMH paf, breast ca, recurrent CVA 05/2015 with residual aphasia and left-sided gaze preference, who was brought to ED due to decreased responsiveness. Found to have UTI. PTA she has been on Dys 1 diet and thin liquids by cup.       SLP Plan  Continue with current plan of care     Recommendations  Diet recommendations: NPO Medication Administration: Via alternative means             Oral Care Recommendations: Oral care QID Follow up Recommendations: Home health SLP;24 hour supervision/assistance Plan: Continue with current plan of care                    Houston Siren 07/18/2015, 9:34 AM   Orbie Pyo Colvin [employer] [phone]

## 2015-07-18 NOTE — Care Management Important Message (Signed)
Important Message  Patient Details  Name: Rose Sullivan MRN: [id number] Date of Birth: 12-18-1933   Medicare Important Message Given:  Yes    [name] 07/18/2015, 10:30 AM

## 2015-07-19 ENCOUNTER — Other Ambulatory Visit (HOSPITAL_COMMUNITY): Payer: Medicare Other

## 2015-07-19 ENCOUNTER — Inpatient Hospital Stay (HOSPITAL_COMMUNITY): Payer: Medicare Other

## 2015-07-19 ENCOUNTER — Ambulatory Visit (HOSPITAL_COMMUNITY): Payer: Medicare Other

## 2015-07-19 DIAGNOSIS — Z515 Encounter for palliative care: Secondary | ICD-10-CM

## 2015-07-19 DIAGNOSIS — R638 Other symptoms and signs concerning food and fluid intake: Secondary | ICD-10-CM | POA: Insufficient documentation

## 2015-07-19 DIAGNOSIS — R5383 Other fatigue: Secondary | ICD-10-CM

## 2015-07-19 DIAGNOSIS — N390 Urinary tract infection, site not specified: Principal | ICD-10-CM

## 2015-07-19 LAB — BASIC METABOLIC PANEL
ANION GAP: 8 (ref 5–15)
BUN: 11 mg/dL (ref 6–20)
CHLORIDE: 115 mmol/L — AB (ref 101–111)
CO2: 19 mmol/L — AB (ref 22–32)
Calcium: 8.4 mg/dL — ABNORMAL LOW (ref 8.9–10.3)
Creatinine, Ser: 0.82 mg/dL (ref 0.44–1.00)
GFR calc non Af Amer: >60 mL/min (ref >60)
GLUCOSE: 101 mg/dL — AB (ref 65–99)
POTASSIUM: 2.7 mmol/L — AB (ref 3.5–5.1)
Sodium: 142 mmol/L (ref 135–145)

## 2015-07-19 LAB — CBC
HEMATOCRIT: 34.6 % — AB (ref 36.0–46.0)
HEMOGLOBIN: 11.4 g/dL — AB (ref 12.0–15.0)
MCH: 30.9 pg (ref 26.0–34.0)
MCHC: 32.9 g/dL (ref 30.0–36.0)
MCV: 93.8 fL (ref 78.0–100.0)
Platelets: 208 10*3/uL (ref 150–400)
RBC: 3.69 MIL/uL — AB (ref 3.87–5.11)
RDW: 13.7 % (ref 11.5–15.5)
WBC: 7.3 10*3/uL (ref 4.0–10.5)

## 2015-07-19 MED ORDER — HALOPERIDOL LACTATE 2 MG/ML PO CONC: 1.0000 mg | Freq: Four times a day (QID) | ORAL | Status: DC | PRN

## 2015-07-19 MED ORDER — MORPHINE SULFATE (CONCENTRATE) 10 MG/0.5ML PO SOLN: 2.5000 mg | ORAL | Status: DC | PRN

## 2015-07-19 MED ORDER — HALOPERIDOL LACTATE 2 MG/ML PO CONC
1.0000 mg | Freq: Four times a day (QID) | ORAL | Status: DC | PRN
  Filled 2015-07-19: qty 0.5

## 2015-07-19 MED ORDER — POTASSIUM CHLORIDE 10 MEQ/100ML IV SOLN
10.0000 meq | INTRAVENOUS | Status: AC
  Administered 2015-07-19 (×4): 10 meq via INTRAVENOUS
  Filled 2015-07-19 (×4): qty 100

## 2015-07-19 NOTE — Progress Notes (Signed)
Speech Language Pathology Treatment: Dysphagia  Patient Details Name: Rose Sullivan MRN: [id number] DOB: August 05, 1933 Today's Date: 07/19/2015 Time: [id number] SLP Time Calculation (min) (ACUTE ONLY): 12 min  Assessment / Plan / Recommendation Clinical Impression  Pt was scheduled for MBS at 1300. SLP saw daughter in the hall who reported meeting with Palliative care and pt will be discharged home today with hospice. SLP and daughter walked to her room where pt is minimally responsive. Pt mouth breathing with visible white mucous on hard palate. Pt closed her mouth each time toothette touched lips. Oral hygiene to lips and tongue; did not attempt aggressive oral care due to obvious discomfort. If pt alert, SLP in agreement with daughter to try sips water for moisture and comfort. ST will sign off.   HPI HPI: 78 female with PMH paf, breast ca, recurrent CVA 05/2015 with residual aphasia and left-sided gaze preference, who was brought to ED due to decreased responsiveness. Found to have UTI. PTA she has been on Dys 1 diet and thin liquids by cup.       SLP Plan  Discharge SLP treatment due to (comment)     Recommendations  Diet recommendations:  (whatever pt able to tolerate if awake, thins)             Oral Care Recommendations: Oral care BID Follow up Recommendations: None Plan: Discharge SLP treatment due to (comment)                    Rose Sullivan 07/19/2015, 12:17 PM  Rose Sullivan Caroli[employer] [phone]

## 2015-07-19 NOTE — Progress Notes (Signed)
Results for ABRIA, BRONAUGH (MRN [id number]) as of 07/19/2015 06:02  Ref. Range 07/19/2015 04:42  Potassium Latest Ref Range: 3.5-5.1 mmol/L 2.7 (LL)  NP on call made aware.

## 2015-07-19 NOTE — Discharge Summary (Signed)
Physician Discharge Summary  Rose Sullivan [id number] DOB: February 09, 1933 DOA: 07/16/2015  PCP: [name], MD  Admit date: 07/16/2015 Discharge date: 07/19/2015  Recommendations for Outpatient Follow-up:  Pt will be discharged home with hospice   Discharge Diagnoses:  Active Problems:   Confusion   UTI (urinary tract infection)   Altered consciousness   Discharge Condition: Stable  Diet recommendation: Comfort feeding   Brief Narrative:  82 y.o. female with h/o paf on apixaban, legally blind, h/o breast cancer on aromasin, h/orecurrent cva last in 05/2015, At baseline she is aphasic and has left gaze from old stroke, but usually is receptive, alert, and follows commands. She has had problems with her wake sleep cycle and was recently prescribed restoril. After her first dose, she slept for 36 hours and has still not become fully alert three days later. CT head no acute findings, UA c/w acute UTI. TRH asked to admit for further evaluation.   Assessment & Plan:  Metabolic and toxic encephalopathy, likely secondary to UTI and restoril dose.  - Still lethargic and difficult to awake, not opening eyes  - completed 4 days days of Rocephin  - PCT consulted and pt's daughter now in agreement to continue with full comfort - wants to take mother home today  - currently pt unable to take anything PO   Dehydration  - evidenced by elevated BUN and Cr, dry MM - still dry MM - continue with full comfort   Acute on chronic renal disease stage III  - with baseline Cr 1.2 - 1.3 and GFR as low as 30- 40 in the past 4 months  - Cr is WNL this AM - no further blood work in an effort to ensure comfort   PAF, CHADS2 score 5-6 - D/C telemetry for comfort - d/c oral meds since pt unable to take anything PO - also d/c anticoagulation  - daughter in agreement   Recurrent CVA with aphasia, left gaze, residual weakness - unable to restart oral meds  - full comfort   Dysphagia  secondary to strokes - Was previously on dysphagia 1 diet prior to admission - unable to take anything PO - comfort feeding allowed if pt wakes up  Accelerated HTN - stop oral meds to ensure comfort   Obesity  - Body mass index is 31.27   Code Status: DNR Family Communication: Daughter at bedside.  Disposition Plan: Home   Consultants:   PCT  Procedures:   None  Antimicrobials:   Ceftriaxone 6/18  Discharge Exam: Filed Vitals:   07/19/15 0556 07/19/15 0913  BP: 179/88 159/87  Pulse: 84 75  Temp: 98.9 F (37.2 C) 98.4 F (36.9 C)  Resp: 18 16   Filed Vitals:   07/18/15 1717 07/18/15 2002 07/19/15 0556 07/19/15 0913  BP:  170/73 179/88 159/87  Pulse: 69 68 84 75  Temp: 98.5 F (36.9 C) 98.8 F (37.1 C) 98.9 F (37.2 C) 98.4 F (36.9 C)  TempSrc: Axillary Oral Oral Oral  Resp: 18 18 18 16   Height:      Weight:      SpO2: 99% 99% 95% 98%    General: Pt is lethargic, not moving and not opening eyes  sCardiovascular: Regular rate and rhythm, no rubs, no gallops Respiratory: Diminished breath sounds at bases   Discharge Instructions    Medication List    STOP taking these medications        amiodarone 200 MG tablet  Commonly known as:  PACERONE  ANTI ITCH EX     apixaban 5 MG Tabs tablet  Commonly known as:  ELIQUIS     Diclofenac Sodium 1.5 % Soln     exemestane 25 MG tablet  Commonly known as:  AROMASIN     metoprolol tartrate 25 MG tablet  Commonly known as:  LOPRESSOR     naproxen sodium 220 MG tablet  Commonly known as:  ANAPROX     pravastatin 40 MG tablet  Commonly known as:  PRAVACHOL     temazepam 30 MG capsule  Commonly known as:  RESTORIL      TAKE these medications        amantadine 50 MG/5ML solution  Commonly known as:  SYMMETREL  100mg  bid taking bid at 8am and 2pm.     haloperidol 2 MG/ML solution  Commonly known as:  HALDOL  Take 0.5 mLs (1 mg total) by mouth every 6 (six) hours as needed for  agitation (anxiety).     morphine CONCENTRATE 10 MG/0.5ML Soln concentrated solution  Take 0.13-0.25 mLs (2.6-5 mg total) by mouth every 4 (four) hours as needed for severe pain or shortness of breath.     oxyCODONE-acetaminophen 7.5-325 MG tablet  Commonly known as:  PERCOCET  Take 1 tablet by mouth every 8 (eight) hours as needed.             The results of significant diagnostics from this hospitalization (including imaging, microbiology, ancillary and laboratory) are listed below for reference.     Microbiology: Recent Results (from the past 240 hour(s))  Urine culture     Status: Abnormal   Collection Time: 07/16/15  4:23 PM  Result Value Ref Range Status   Specimen Description URINE, CATHETERIZED  Final   Special Requests NONE  Final   Culture >=100,000 COLONIES/mL ESCHERICHIA COLI (A)  Final   Report Status 07/18/2015 FINAL  Final   Organism ID, Bacteria ESCHERICHIA COLI (A)  Final      Susceptibility   Escherichia coli - MIC*    AMPICILLIN >=32 RESISTANT Resistant     CEFAZOLIN <=4 SENSITIVE Sensitive     CEFTRIAXONE <=1 SENSITIVE Sensitive     CIPROFLOXACIN >=4 RESISTANT Resistant     GENTAMICIN <=1 SENSITIVE Sensitive     IMIPENEM <=0.25 SENSITIVE Sensitive     NITROFURANTOIN <=16 SENSITIVE Sensitive     TRIMETH/SULFA <=20 SENSITIVE Sensitive     AMPICILLIN/SULBACTAM >=32 RESISTANT Resistant     PIP/TAZO <=4 SENSITIVE Sensitive     * >=100,000 COLONIES/mL ESCHERICHIA COLI  Blood culture (routine x 2)     Status: None (Preliminary result)   Collection Time: 07/16/15  6:02 PM  Result Value Ref Range Status   Specimen Description BLOOD RIGHT HAND  Final   Special Requests IN PEDIATRIC BOTTLE 1CC  Final   Culture NO GROWTH 3 DAYS  Final   Report Status PENDING  Incomplete  Blood culture (routine x 2)     Status: None (Preliminary result)   Collection Time: 07/16/15  6:09 PM  Result Value Ref Range Status   Specimen Description BLOOD RIGHT WRIST  Final    Special Requests IN PEDIATRIC BOTTLE 3CC  Final   Culture NO GROWTH 3 DAYS  Final   Report Status PENDING  Incomplete     Labs: Basic Metabolic Panel:  Recent Labs Lab 07/16/15 1356 07/17/15 0343 07/19/15 0442  NA 142 144 142  K 4.2 3.6 2.7*  CL 112* 113* 115*  CO2 20* 22 19*  GLUCOSE 83 109* 101*  BUN 44* 40* 11  CREATININE 1.43* 1.24* 0.82  CALCIUM 9.2 8.8* 8.4*   Liver Function Tests:  Recent Labs Lab 07/16/15 1356 07/17/15 0343  AST 39 33  ALT 26 23  ALKPHOS 64 54  BILITOT 2.1* 1.8*  PROT 6.8 6.1*  ALBUMIN 3.7 3.1*   CBC:  Recent Labs Lab 07/16/15 1356 07/17/15 0343 07/18/15 0519 07/19/15 0442  WBC 9.1 11.0* 8.1 7.3  NEUTROABS 6.6  --   --   --   HGB 13.3 11.5* 11.0* 11.4*  HCT 40.8 35.4* 33.6* 34.6*  MCV 94.7 93.7 93.6 93.8  PLT 246 227 214 208   SIGNED: Time coordinating discharge:  30 minutes  MAGICK-Brennan Litzinger, MD  [facility] 07/19/2015, 1:30 PM Pager [phone]  If 7PM-7AM, please contact night-coverage [email or web address] Password [email or web address]

## 2015-07-19 NOTE — Progress Notes (Signed)
Notified by [facility] of family request for Hospice and [place] services at home after discharge. Chart and patient information reviewed and hospice eligibility confirmed.   Spoke with daughter, [name] to initiate education related to hospice philosophy, services and team approach to care. Family verbalized understanding of the information provided. Per discussion, plan is for discharge to home by [facility] today.   Please send signed completed DNR form home with patient.  Patient will need prescriptions for discharge comfort medications.   DME needs discussed and family requested PRN oxygen and suction for delivery to the home today.  [facility] equipment manager Jewel Ysidro Evert notified and will contact [place] to arrange delivery to the home.  The home address has been verified and is correct in the chart; [name] is family member to be contacted to arrange time of delivery.   [facility] aware of the above.  Completed discharge summary will need to be faxed to [facility] at [phone] when final.  Please notify [facility] when patient is ready to leave unit at discharge-call [phone].   [facility] information and contact numbers have been given to [place] during visit.   Please call with any questions.  Thank You,  Freddi Starr RN, [facility] Liaison  [phone]

## 2015-07-19 NOTE — Progress Notes (Signed)
PROGRESS NOTE  Rose Sullivan  [id number] DOB: [birth year] DOA: 07/16/2015 PCP: [name], MD  Brief Narrative:  82 y.o. female with h/o paf on apixaban, legally blind, h/o breast cancer on aromasin, h/orecurrent cva last in 05/2015, At baseline she is aphasic and has left gaze from old stroke, but usually is receptive, alert, and follows commands.  She has had problems with her wake sleep cycle and was recently prescribed restoril.  After her first dose, she slept for 36 hours and has still not become fully alert three days later.  CT head no acute findings, UA c/w acute UTI. TRH asked to admit for further evaluation.   Assessment & Plan:   Metabolic and toxic encephalopathy, likely secondary to UTI and restoril dose.   - Still lethargic and difficult to awake, not opening eyes  - Continue with aspiration precautions - Continue ceftriaxone day #4 and continue to monitor clinical response  - F/u urine culture, growing E. Coli, final report pending  - d/w daughter findings of recent MRI and diffuse global atrophy with evidence of encephalomalacia/laminar necrosis, we have discussed meaning of these words and how they correlate with clinical findings and rather guarded prognosis, unpredictable course of alert, awake, lethargic cycles - daughter questioned reversibility of these findings and I have explained this is unlikely and we have therefore discussed trying to focus on other medical efforts that would focus on quality of life as it is very clear from daughter's speech that mother appreciates comfort in life and would trade few years of her life to ensure quality - plan for MBS today but I think pt is too lethargic to participate, if pt unable to wake up, would try to focus on comfort  - daughter in agreement  - awaiting PCT input   Dehydration  - evidenced by elevated BUN and Cr, dry MM - still dry MM - continue with supportive care - BMP in AM  Acute on chronic renal disease  stage III  - with baseline Cr 1.2 - 1.3 and GFR as low as 30- 40 in the past 4 months  - Cr is WNL this AM - BMP in AM  PAF, CHADS2 score 5-6 - D/C telemetry for comfort - Continue IV metoprolol until tolerating PO  - D/c heparin gtt, continue with lovenox injections once daily for now - ? Need for AC at this point, d/w daughter at bedside, will see what PCT recommends   Recurrent CVA with aphasia, left gaze, residual weakness - unable to restart oral meds yet - repeat MRI brain with no acute strokes but diffuse global atrophy noted again   Dysphagia secondary to strokes - Was previously on dysphagia 1 diet prior to admission - MBS today if pt able to wake up  Accelerated HTN - continue Hydralazine as needed for now  Obesity  - Body mass index is 31.27   DVT prophylaxis:  lovenox Code Status:  DNR Family Communication:  Daughter at bedside.    Disposition Plan:  Home in 1-2 days.   Consultants:   PCT  Procedures:   None  Antimicrobials:   Ceftriaxone  6/18  Subjective:  Patient asleep and not able to answer questions  Objective: Filed Vitals:   07/18/15 1706 07/18/15 1717 07/18/15 2002 07/19/15 0556  BP: 170/91  170/73 179/88  Pulse: 61 69 68 84  Temp:  98.5 F (36.9 C) 98.8 F (37.1 C) 98.9 F (37.2 C)  TempSrc:  Axillary Oral Oral  Resp:  18 18 18   Height:      Weight:      SpO2: 99% 99% 99% 95%    Intake/Output Summary (Last 24 hours) at 07/19/15 0659 Last data filed at 07/19/15 0600  Gross per 24 hour  Intake   1745 ml  Output    101 ml  Net   1644 ml   Filed Weights   07/16/15 2017 07/17/15 2129  Weight: 71.396 kg (157 lb 6.4 oz) 72.621 kg (160 lb 1.6 oz)    Examination:  General exam:  Adult female, asleep with mouth gaping open and only grimaces when we attempt to arouse her.   HEENT:  NCAT, dry MM with copious dry secretions in mouth Respiratory system: Diminished breath sounds at bases Cardiovascular system: IRRR, no rubs,  gallops or clicks.  Warm extremities Gastrointestinal system: Normal active bowel sounds, soft, nondistended, nontender. MSK:  Normal tone and bulk, nRegular o lower extremity edema Neuro:  Not following commands.  Not moving upper or lower extremities.   Data Reviewed: I have personally reviewed following labs and imaging studies  CBC:  Recent Labs Lab 07/16/15 1356 07/17/15 0343 07/18/15 0519 07/19/15 0442  WBC 9.1 11.0* 8.1 7.3  NEUTROABS 6.6  --   --   --   HGB 13.3 11.5* 11.0* 11.4*  HCT 40.8 35.4* 33.6* 34.6*  MCV 94.7 93.7 93.6 93.8  PLT 246 227 214 [id number]   Basic Metabolic Panel:  Recent Labs Lab 07/16/15 1356 07/17/15 0343 07/19/15 0442  NA 142 144 142  K 4.2 3.6 2.7*  CL 112* 113* 115*  CO2 20* 22 19*  GLUCOSE 83 109* 101*  BUN 44* 40* 11  CREATININE 1.43* 1.24* 0.82  CALCIUM 9.2 8.8* 8.4*   Liver Function Tests:  Recent Labs Lab 07/16/15 1356 07/17/15 0343  AST 39 33  ALT 26 23  ALKPHOS 64 54  BILITOT 2.1* 1.8*  PROT 6.8 6.1*  ALBUMIN 3.7 3.1*   Urine analysis:    Component Value Date/Time   COLORURINE AMBER* 07/16/2015 1623   APPEARANCEUR TURBID* 07/16/2015 1623   LABSPEC 1.026 07/16/2015 1623   PHURINE 5.5 07/16/2015 1623   GLUCOSEU NEGATIVE 07/16/2015 1623   GLUCOSEU NEGATIVE 06/10/2015 1631   HGBUR MODERATE* 07/16/2015 1623   BILIRUBINUR SMALL* 07/16/2015 1623   BILIRUBINUR negative 06/14/2015 1403   KETONESUR 15* 07/16/2015 1623   PROTEINUR 30* 07/16/2015 1623   PROTEINUR 2+ 06/14/2015 1403   UROBILINOGEN negative 06/14/2015 1403   UROBILINOGEN 1.0 06/10/2015 1631   NITRITE POSITIVE* 07/16/2015 1623   NITRITE negative 06/14/2015 1403   LEUKOCYTESUR LARGE* 07/16/2015 1623   Recent Results (from the past 240 hour(s))  Urine culture     Status: Abnormal   Collection Time: 07/16/15  4:23 PM  Result Value Ref Range Status   Specimen Description URINE, CATHETERIZED  Final   Special Requests NONE  Final   Culture >=100,000  COLONIES/mL ESCHERICHIA COLI (A)  Final   Report Status 07/18/2015 FINAL  Final   Organism ID, Bacteria ESCHERICHIA COLI (A)  Final      Susceptibility   Escherichia coli - MIC*    AMPICILLIN >=32 RESISTANT Resistant     CEFAZOLIN <=4 SENSITIVE Sensitive     CEFTRIAXONE <=1 SENSITIVE Sensitive     CIPROFLOXACIN >=4 RESISTANT Resistant     GENTAMICIN <=1 SENSITIVE Sensitive     IMIPENEM <=0.25 SENSITIVE Sensitive     NITROFURANTOIN <=16 SENSITIVE Sensitive     TRIMETH/SULFA <=20 SENSITIVE Sensitive  AMPICILLIN/SULBACTAM >=32 RESISTANT Resistant     PIP/TAZO <=4 SENSITIVE Sensitive     * >=100,000 COLONIES/mL ESCHERICHIA COLI  Blood culture (routine x 2)     Status: None (Preliminary result)   Collection Time: 07/16/15  6:02 PM  Result Value Ref Range Status   Specimen Description BLOOD RIGHT HAND  Final   Special Requests IN PEDIATRIC BOTTLE 1CC  Final   Culture NO GROWTH 2 DAYS  Final   Report Status PENDING  Incomplete  Blood culture (routine x 2)     Status: None (Preliminary result)   Collection Time: 07/16/15  6:09 PM  Result Value Ref Range Status   Specimen Description BLOOD RIGHT WRIST  Final   Special Requests IN PEDIATRIC BOTTLE 3CC  Final   Culture NO GROWTH 2 DAYS  Final   Report Status PENDING  Incomplete      Radiology Studies: Mr Brain Wo Contrast  07/18/2015  CLINICAL DATA:  82 year old hypertensive female who was legally blind with history of breast cancer (post chemotherapy). Recurrent stroke May 2017. Recent reaction to medication. Subsequent encounter. EXAM: MRI HEAD WITHOUT CONTRAST TECHNIQUE: Multiplanar, multiecho pulse sequences of the brain and surrounding structures were obtained without intravenous contrast. COMPARISON:  07/16/2015 head CT.  06/18/2015 brain MR. FINDINGS: Exam is motion degraded. Subacute left occipital lobe infarct with encephalomalacia/laminar necrosis. Remote small thalamic infarcts. No acute infarct. Prominent chronic microvascular  changes. Moderate to marked global atrophy. Ventricular prominence may be related to atrophy rather than hydrocephalus and is without change. No intracranial mass lesion noted on this unenhanced exam. Major intracranial vascular structures are patent. Post lens replacement. Ethmoid sinus air cell and right sphenoid sinus air cell mucosal thickening. IMPRESSION: Subacute left occipital lobe infarct with encephalomalacia/laminar necrosis. Remote small thalamic infarcts. No acute infarct. Prominent chronic microvascular changes. Moderate to marked global atrophy. Ventricular prominence may be related to atrophy rather than hydrocephalus and is without change. Ethmoid sinus air cell and right sphenoid sinus air cell mucosal thickening. Electronically Signed   By: Genia Del M.D.   On: 07/18/2015 11:50     Scheduled Meds: . antiseptic oral rinse  15 mL Mouth Rinse BID  . antiseptic oral rinse  7 mL Mouth Rinse q12n4p  . cefTRIAXone (ROCEPHIN)  IV  1 g Intravenous Q24H  . chlorhexidine  15 mL Mouth Rinse BID  . diphenhydrAMINE  25 mg Oral Once  . enoxaparin (LOVENOX) injection  1.5 mg/kg Subcutaneous Q24H  . metoprolol  2.5 mg Intravenous Q8H  . potassium chloride  10 mEq Intravenous Q1 Hr x 4  . sodium chloride flush  3 mL Intravenous Q12H   Continuous Infusions: . dextrose 5 % and 0.9% NaCl 75 mL/hr at 07/18/15 1854     LOS: 3 days    Time spent: 30 min    Faye Ramsay, MD [facility] Pager [phone]  If 7PM-7AM, please contact night-coverage [email or web address] Password [email or web address] 07/19/2015, 6:59 AM

## 2015-07-19 NOTE — Care Management Note (Addendum)
Case Management Note  Patient Details  Name: LORAY MAPLES MRN: [id number] Date of Birth: [birth year]  Subjective/Objective:                CM following for progression and d/c planning.     Action/Plan: 07/19/2015 per pall consult plan is to d/c to home with hospice services. Mrs Manasse is already active with [facility]. Pt daughter selected [facility] for home hospice services. This CM placed referral with [facility] and notified [place] of plan to d/c this pt to home with home hospice services and the desire of family for [facility] to resume services.   3pm Ambulance form completed and pt ready for d/c , await eval by Hospice MD as this pt has previously had hospice services.   Expected Discharge Date:  07/20/15               Expected Discharge Plan:  Home w Hospice Care  In-House Referral:  NA  Discharge planning Services  CM Consult  Post Acute Care Choice:  Durable Medical Equipment, Home Health Choice offered to:  Adult Children  DME Arranged:  Oxygen, Suction, Hospital bed DME Agency:  [place]:  RN, NA, Social Work [facility] Agency:  [facility]ospice and [place] of Service:  In process, will continue to follow  Medicare Important Message Given:  Yes Date Medicare IM Given:    Medicare IM give by:    Date Additional Medicare IM Given:    Additional Medicare Important Message give by:     If discussed at [place] of Stay Meetings, dates discussed:    Additional Comments:  Adron Bene, RN 07/19/2015, 11:40 AM

## 2015-07-19 NOTE — Progress Notes (Signed)
   07/19/15 1400  Clinical Encounter Type  Visited With Patient and family together  Visit Type Spiritual support  Referral From Care management  Spiritual Encounters  Spiritual Needs Emotional  Stress Factors  Family Stress Factors Loss  Chaplain visited with patient's daughter and friend prior to discharge of patient to Hospice at home. Blessed their journey. Elmor Kost, Chaplain

## 2015-07-19 NOTE — Consult Note (Signed)
Consultation Note Date: 07/19/2015   Patient Name: Rose Sullivan  DOB: 1933/12/21  MRN: [id number]  Age / Sex: 82 y.o., female  PCP: Biagio Borg, MD Referring Physician: Theodis Blaze, MD  Reason for Consultation: Establishing goals of care  HPI/Patient Profile: 82 y.o. female  with past medical history of atrial fibrillation on apixaban, legally blind since birth, h/o breast cancer (had hospice 10+ years ago s/t to this diagnosis), h/orecurrent stroke last in 05/2015, At baseline she is aphasic, has left gaze from old stroke, but usually is receptive, alert, and follows commands.Admitted on 07/16/2015 with AMS/lethargy. She has had problems with her wake sleep cycle and was recently prescribed restoril. After her first dose, she slept for 36 hours and has still not become fully alert days later. CT head no acute findings but MRI did show encephalomalacia/laminar necrosis and diffuse global atrophy, UA consistent with acute UTI.   Clinical Assessment and Goals of Care: I met today with Rose Sullivan but she was unable to participate in conversation. I spoke privately with daughter, Rose Sullivan, at bedside. Rose Sullivan is very emotional and struggling with her mother's poor prognosis. She is overwhelmed with the thought that her mother may be at EOL but is also able to admit that this may be the case. Her true struggle with decisions is the fact that she is not giving her mother a chance to get better. After much conversation and emotions Rose Sullivan's main goal for her mother is for her to be at home, she does not want her to die in a hospital (but may be open to hospice facility if she becomes symptomatic or overwhelmed caring for her at home). We discuss the trajectory with IVF and injected antibiotics as this would prolong her long potentially but not a reverse to her overall problem and unlikely to add to her QOL. After  discussing all options Rose Sullivan has decided to take her mother home with hospice for comfort. Rose Sullivan understands her mother cannot take po medications and this places her at risk for another stroke. We also discussed comfort feeding with aspiration risk but for comfort at EOL. Also discussed SL medications for comfort. Rose Sullivan has come to terms that if God wants to heal her that he can do so but agrees she wants her mother comfortable and at home and wants her as peaceful as possible. Emotional support provided.   Rose Sullivan is very emotional and will need max support for her mother and for herself at home. She will benefit from counseling with hospice and desires this support as she has worsened anxiety from the situation.   I also spoke briefly with Rose Chang, NP who had seen Rose Sullivan as outpatient palliative care consult and confirms that patient has been declining at home and agrees with home hospice as appropriate.   NEXT OF KIN daughter Foster with hospice and comfort care - Max support for family in home (2 daughters, granddaughter, and close family friend  live in home with them)   Code Status/Advance Care Planning:  DNR   Symptom Management:   Dyspnea: Roxanol 2.5-5 mg every 4 hours prn. To have oxygen in home as well for comfort.   Anxiety: Haldol prn.   Pain: No signs of pain currently. Roxanol prn.   Palliative Prophylaxis:   Aspiration, Delirium Protocol, Oral Care and Turn Reposition  Additional Recommendations (Limitations, Scope, Preferences):  Full Comfort Care  Psycho-social/Spiritual:   Desire for further Chaplaincy support:yes  Additional Recommendations: Caregiving  Support/Resources, Education on Hospice and Grief/Bereavement Support  Prognosis:   < 2 weeks  Discharge Planning: Home with Hospice      Primary Diagnoses: Present on Admission:  . UTI (urinary tract infection) . Altered consciousness .  Confusion  I have reviewed the medical record, interviewed the patient and family, and examined the patient. The following aspects are pertinent.  Past Medical History  Diagnosis Date  . HYPERLIPIDEMIA   . BLINDNESS, [place], [place] DEFINITION 11/25/2006  . HYPERTENSION   . PAF (paroxysmal atrial fibrillation) ([place])     a. started on amio 05/2015 by IM.  Marland Kitchen GERD 02/11/2007  . LOW BACK PAIN, CHRONIC 11/25/2006  . BACK PAIN 12/10/2007  . Pain in Soft Tissues of Limb 02/11/2007  . OSTEOPENIA 02/11/2007  . GAIT DISTURBANCE 12/10/2007  . Abdominal pain, left lower quadrant 04/01/2007  . BREAST CANCER, HX OF 11/25/2006  . Nocturia 05/18/2010  . Hypersomnia 05/18/2010  . Impaired glucose tolerance     a. hx of diet controlled DM per chart.  . Allergic rhinitis, cause unspecified 07/23/2012  . Anxiety state, unspecified 02/04/2013  . Cancer ([place])   . CKD (chronic kidney disease), stage II     CKD II-III per labs  . Atrial flutter ([place])     a. s/p remote ablation.  . SVT (supraventricular tachycardia) ([place])   . Obesity   . Shortness of breath   . Stroke [facility])     a. Stroke 03/2015 with readmission 03/2015 for extension of stroke felt 2/2 hypotension from UTI.  b. Adm 05/2015 for encephalopathy encephalopathy secondary to left occipital stroke extension (not afib cardioembolic pattern, more likely related to hypotension from dehydration/UTI).   Social History   Social History  . Marital Status: Widowed    Spouse Name: N/A  . Number of Children: 1  . Years of Education: N/A   Occupational History  .     Social History Main Topics  . Smoking status: Never Smoker   . Smokeless tobacco: Never Used     Comment: quit 40 years ago  . Alcohol Use: No  . Drug Use: No  . Sexual Activity: No   Other Topics Concern  . None   Social History Narrative   Lives with daughter   Family History  Problem Relation Age of Onset  . Hypertension Brother   . Lung cancer Brother   . Colon cancer Father   .  COPD Brother   . Stroke Mother   . Colon cancer Father    Scheduled Meds: . antiseptic oral rinse  15 mL Mouth Rinse BID  . antiseptic oral rinse  7 mL Mouth Rinse q12n4p  . cefTRIAXone (ROCEPHIN)  IV  1 g Intravenous Q24H  . chlorhexidine  15 mL Mouth Rinse BID  . enoxaparin (LOVENOX) injection  1.5 mg/kg Subcutaneous Q24H  . metoprolol  2.5 mg Intravenous Q8H  . potassium chloride  10 mEq Intravenous Q1 Hr x 4  . sodium chloride  flush  3 mL Intravenous Q12H   Continuous Infusions: . dextrose 5 % and 0.9% NaCl 75 mL/hr at 07/19/15 0836   PRN Meds:.camphor-menthol, hydrALAZINE Medications Prior to Admission:  Prior to Admission medications   Medication Sig Start Date End Date Taking? Authorizing Provider  amantadine (SYMMETREL) 50 MG/5ML solution 120m bid taking bid at 8am and 2pm. Patient taking differently: Take 100 mg by mouth 2 (two) times daily. 1038mbid taking bid at 8am and 2pm. 07/14/15  Yes JiRosalin HawkingMD  amiodarone (PACERONE) 200 MG tablet Take 1 tablet (200 mg total) by mouth daily. 07/15/15  Yes Dayna N Dunn, PA-C  apixaban (ELIQUIS) 5 MG TABS tablet Take 1 tablet (5 mg total) by mouth 2 (two) times daily. 06/23/15  Yes JaNita SellsMD  Diclofenac Sodium 1.5 % SOLN Place 2 mLs onto the skin 3 (three) times daily. 04/05/15  Yes ZaMeredith StaggersMD  exemestane (AROMASIN) 25 MG tablet TAKE 1 TABLET BY MOUTH EVERY DAY 05/31/15  Yes JaBiagio BorgMD  metoprolol tartrate (LOPRESSOR) 25 MG tablet Take 0.5 tablets (12.5 mg total) by mouth 2 (two) times daily. 03/28/15  Yes Daniel J Angiulli, PA-C  naproxen sodium (ANAPROX) 220 MG tablet Take 220 mg by mouth 2 (two) times daily as needed (for pain).   Yes Historical Provider, MD  Pramoxine-Camphor-Zinc Acetate (ANTI ITCH EX) Apply 1 application topically as needed (for itching).   Yes Historical Provider, MD  pravastatin (PRAVACHOL) 40 MG tablet TAKE 1 TABLET BY MOUTH EVERY DAY AT 6 PM 07/13/15  Yes JaBiagio BorgMD  temazepam  (RESTORIL) 30 MG capsule TAKE ONE CAPSULE BY MOUTH AT BEDTIME AS NEEDED FOR SLEEP 06/24/15  Yes JaBiagio BorgMD  oxyCODONE-acetaminophen (PERCOCET) 7.5-325 MG tablet Take 1 tablet by mouth every 8 (eight) hours as needed. 06/03/15   EuBayard HuggerNP   Allergies  Allergen Reactions  . Temazepam Other (See Comments)    3068mose is too strong, brought her to hospital 07/16/15-per patient  . Trazodone And Nefazodone Other (See Comments)    hallucinations   Review of Systems  Unable to perform ROS: Patient nonverbal    Physical Exam  Constitutional: She appears well-developed. She appears lethargic.  HENT:  Head: Normocephalic and atraumatic.  Cardiovascular: Normal rate.   Pulmonary/Chest: Effort normal. No accessory muscle usage. No tachypnea. No respiratory distress.  Abdominal: Soft. Normal appearance.  Neurological: She appears lethargic.    Vital Signs: BP 159/87 mmHg  Pulse 75  Temp(Src) 98.4 F (36.9 C) (Oral)  Resp 16  Ht 5' (1.524 m)  Wt 72.621 kg (160 lb 1.6 oz)  BMI 31.27 kg/m2  SpO2 98% Pain Assessment: Faces   Pain Score: Asleep   SpO2: SpO2: 98 % O2 Device:SpO2: 98 % O2 Flow Rate: .   IO: Intake/output summary:  Intake/Output Summary (Last 24 hours) at 07/19/15 1116 Last data filed at 07/19/15 0914  Gross per 24 hour  Intake 1626.25 ml  Output    101 ml  Net 1525.25 ml    LBM: Last BM Date:  (unk) Baseline Weight: Weight: 71.396 kg (157 lb 6.4 oz) Most recent weight: Weight: 72.621 kg (160 lb 1.6 oz)     Palliative Assessment/Data:     Time In: 0930 Time Out: 1120 Time Total: 110m5mreater than 50%  of this time was spent counseling and coordinating care related to the above assessment and plan.  Signed by: ParkPershing Proud   Please contact  Palliative Medicine Team phone at [phone] for questions and concerns.  For individual provider: See Shea Evans

## 2015-07-19 NOTE — Progress Notes (Signed)
Patient Discharge:  Disposition: Home with daughter on Hospice  Education: Hospice education provided and Hospice team visited  IV: removed  Telemetry: none  Follow-up appointments: not applicable  Prescriptions: provided to daughter  Transportation: ambulance transport to home  Belongings: Daughter took patients belongings home.

## 2015-07-20 NOTE — Telephone Encounter (Signed)
Ok to continue to monitor

## 2015-07-20 NOTE — Telephone Encounter (Signed)
Please advise 

## 2015-07-21 ENCOUNTER — Telehealth: Payer: Self-pay | Admitting: *Deleted

## 2015-07-21 LAB — CULTURE, BLOOD (ROUTINE X 2)
Culture: NO GROWTH
Culture: NO GROWTH

## 2015-07-21 NOTE — Telephone Encounter (Signed)
Pt was on TCM list admitted for UTI, altered consciousness, and confusion. Pt D/C 07/19/15 with home hospice. All oral med has been d/c only giving comfort medication...lmb

## 2015-07-24 ENCOUNTER — Other Ambulatory Visit: Payer: Self-pay | Admitting: Physical Medicine & Rehabilitation

## 2015-08-04 ENCOUNTER — Ambulatory Visit: Payer: Medicare Other | Admitting: Nurse Practitioner

## 2015-08-05 ENCOUNTER — Ambulatory Visit: Payer: Medicare Other | Admitting: Neurology

## 2015-08-08 ENCOUNTER — Encounter: Payer: Medicare Other | Attending: Physical Medicine & Rehabilitation | Admitting: Registered Nurse

## 2015-08-08 ENCOUNTER — Ambulatory Visit: Payer: Medicare Other | Admitting: Nurse Practitioner

## 2015-08-08 DIAGNOSIS — G894 Chronic pain syndrome: Secondary | ICD-10-CM | POA: Insufficient documentation

## 2015-08-08 DIAGNOSIS — Z5181 Encounter for therapeutic drug level monitoring: Secondary | ICD-10-CM | POA: Insufficient documentation

## 2015-08-08 DIAGNOSIS — Z79899 Other long term (current) drug therapy: Secondary | ICD-10-CM | POA: Insufficient documentation

## 2015-08-11 ENCOUNTER — Emergency Department (HOSPITAL_COMMUNITY): Payer: Medicare Other

## 2015-08-11 ENCOUNTER — Encounter (HOSPITAL_COMMUNITY): Payer: Self-pay | Admitting: Emergency Medicine

## 2015-08-11 ENCOUNTER — Inpatient Hospital Stay (HOSPITAL_COMMUNITY)
Admission: EM | Admit: 2015-08-11 | Discharge: 2015-08-17 | DRG: 682 | Disposition: A | Discharge status: Home Health | Payer: Medicare Other | Attending: Family Medicine | Admitting: Family Medicine

## 2015-08-11 DIAGNOSIS — Z515 Encounter for palliative care: Secondary | ICD-10-CM | POA: Diagnosis present

## 2015-08-11 DIAGNOSIS — Z888 Allergy status to other drugs, medicaments and biological substances status: Secondary | ICD-10-CM

## 2015-08-11 DIAGNOSIS — G9340 Encephalopathy, unspecified: Secondary | ICD-10-CM | POA: Diagnosis present

## 2015-08-11 DIAGNOSIS — R1310 Dysphagia, unspecified: Secondary | ICD-10-CM | POA: Diagnosis present

## 2015-08-11 DIAGNOSIS — L8990 Pressure ulcer of unspecified site, unspecified stage: Secondary | ICD-10-CM | POA: Insufficient documentation

## 2015-08-11 DIAGNOSIS — I480 Paroxysmal atrial fibrillation: Secondary | ICD-10-CM | POA: Diagnosis present

## 2015-08-11 DIAGNOSIS — N183 Chronic kidney disease, stage 3 (moderate): Secondary | ICD-10-CM | POA: Diagnosis present

## 2015-08-11 DIAGNOSIS — E860 Dehydration: Secondary | ICD-10-CM | POA: Diagnosis present

## 2015-08-11 DIAGNOSIS — E1122 Type 2 diabetes mellitus with diabetic chronic kidney disease: Secondary | ICD-10-CM | POA: Diagnosis present

## 2015-08-11 DIAGNOSIS — E870 Hyperosmolality and hypernatremia: Secondary | ICD-10-CM | POA: Diagnosis present

## 2015-08-11 DIAGNOSIS — Z9011 Acquired absence of right breast and nipple: Secondary | ICD-10-CM

## 2015-08-11 DIAGNOSIS — L89153 Pressure ulcer of sacral region, stage 3: Secondary | ICD-10-CM | POA: Diagnosis present

## 2015-08-11 DIAGNOSIS — R627 Adult failure to thrive: Secondary | ICD-10-CM | POA: Diagnosis present

## 2015-08-11 DIAGNOSIS — Z823 Family history of stroke: Secondary | ICD-10-CM

## 2015-08-11 DIAGNOSIS — N179 Acute kidney failure, unspecified: Principal | ICD-10-CM | POA: Diagnosis present

## 2015-08-11 DIAGNOSIS — I63532 Cerebral infarction due to unspecified occlusion or stenosis of left posterior cerebral artery: Secondary | ICD-10-CM | POA: Diagnosis present

## 2015-08-11 DIAGNOSIS — Z853 Personal history of malignant neoplasm of breast: Secondary | ICD-10-CM

## 2015-08-11 DIAGNOSIS — E785 Hyperlipidemia, unspecified: Secondary | ICD-10-CM | POA: Diagnosis present

## 2015-08-11 DIAGNOSIS — B952 Enterococcus as the cause of diseases classified elsewhere: Secondary | ICD-10-CM | POA: Diagnosis present

## 2015-08-11 DIAGNOSIS — Z66 Do not resuscitate: Secondary | ICD-10-CM | POA: Diagnosis present

## 2015-08-11 DIAGNOSIS — F411 Generalized anxiety disorder: Secondary | ICD-10-CM | POA: Diagnosis present

## 2015-08-11 DIAGNOSIS — Z8673 Personal history of transient ischemic attack (TIA), and cerebral infarction without residual deficits: Secondary | ICD-10-CM

## 2015-08-11 DIAGNOSIS — T17908A Unspecified foreign body in respiratory tract, part unspecified causing other injury, initial encounter: Secondary | ICD-10-CM

## 2015-08-11 DIAGNOSIS — E876 Hypokalemia: Secondary | ICD-10-CM | POA: Diagnosis present

## 2015-08-11 DIAGNOSIS — K219 Gastro-esophageal reflux disease without esophagitis: Secondary | ICD-10-CM | POA: Diagnosis present

## 2015-08-11 DIAGNOSIS — K5641 Fecal impaction: Secondary | ICD-10-CM | POA: Diagnosis not present

## 2015-08-11 DIAGNOSIS — R1110 Vomiting, unspecified: Secondary | ICD-10-CM

## 2015-08-11 DIAGNOSIS — N390 Urinary tract infection, site not specified: Secondary | ICD-10-CM | POA: Diagnosis present

## 2015-08-11 DIAGNOSIS — I129 Hypertensive chronic kidney disease with stage 1 through stage 4 chronic kidney disease, or unspecified chronic kidney disease: Secondary | ICD-10-CM | POA: Diagnosis present

## 2015-08-11 DIAGNOSIS — Z825 Family history of asthma and other chronic lower respiratory diseases: Secondary | ICD-10-CM

## 2015-08-11 DIAGNOSIS — Z8249 Family history of ischemic heart disease and other diseases of the circulatory system: Secondary | ICD-10-CM

## 2015-08-11 DIAGNOSIS — Z800 Family history of malignant neoplasm of digestive organs: Secondary | ICD-10-CM

## 2015-08-11 DIAGNOSIS — Z801 Family history of malignant neoplasm of trachea, bronchus and lung: Secondary | ICD-10-CM

## 2015-08-11 DIAGNOSIS — K5900 Constipation, unspecified: Secondary | ICD-10-CM

## 2015-08-11 DIAGNOSIS — H548 Legal blindness, as defined in USA: Secondary | ICD-10-CM | POA: Diagnosis present

## 2015-08-11 DIAGNOSIS — R4182 Altered mental status, unspecified: Secondary | ICD-10-CM

## 2015-08-11 LAB — BASIC METABOLIC PANEL
ANION GAP: 15 (ref 5–15)
BUN: 40 mg/dL — ABNORMAL HIGH (ref 6–20)
CALCIUM: 9.2 mg/dL (ref 8.9–10.3)
CO2: 22 mmol/L (ref 22–32)
Chloride: 110 mmol/L (ref 101–111)
Creatinine, Ser: 1.37 mg/dL — ABNORMAL HIGH (ref 0.44–1.00)
GFR, EST AFRICAN AMERICAN: 41 mL/min — AB (ref >60)
GFR, EST NON AFRICAN AMERICAN: 35 mL/min — AB (ref >60)
Glucose, Bld: 86 mg/dL (ref 65–99)
POTASSIUM: 4.2 mmol/L (ref 3.5–5.1)
SODIUM: 147 mmol/L — AB (ref 135–145)

## 2015-08-11 LAB — URINALYSIS, ROUTINE W REFLEX MICROSCOPIC
GLUCOSE, UA: NEGATIVE mg/dL
Hgb urine dipstick: NEGATIVE
KETONES UR: 15 mg/dL — AB
NITRITE: POSITIVE — AB
PROTEIN: 30 mg/dL — AB
Specific Gravity, Urine: 1.028 (ref 1.005–1.030)
pH: 5 (ref 5.0–8.0)

## 2015-08-11 LAB — CBC WITH DIFFERENTIAL/PLATELET
BASOS ABS: 0 10*3/uL (ref 0.0–0.1)
BASOS PCT: 0 %
Eosinophils Absolute: 0.2 10*3/uL (ref 0.0–0.7)
Eosinophils Relative: 1 %
HEMATOCRIT: 41.2 % (ref 36.0–46.0)
Hemoglobin: 13.3 g/dL (ref 12.0–15.0)
LYMPHS PCT: 13 %
Lymphs Abs: 1.5 10*3/uL (ref 0.7–4.0)
MCH: 31.4 pg (ref 26.0–34.0)
MCHC: 32.3 g/dL (ref 30.0–36.0)
MCV: 97.4 fL (ref 78.0–100.0)
MONO ABS: 0.8 10*3/uL (ref 0.1–1.0)
Monocytes Relative: 7 %
NEUTROS ABS: 8.8 10*3/uL — AB (ref 1.7–7.7)
Neutrophils Relative %: 79 %
PLATELETS: 271 10*3/uL (ref 150–400)
RBC: 4.23 MIL/uL (ref 3.87–5.11)
RDW: 14.2 % (ref 11.5–15.5)
WBC: 11.3 10*3/uL — AB (ref 4.0–10.5)

## 2015-08-11 LAB — HEPATIC FUNCTION PANEL
ALK PHOS: 65 U/L (ref 38–126)
ALT: 23 U/L (ref 14–54)
AST: 36 U/L (ref 15–41)
Albumin: 3.6 g/dL (ref 3.5–5.0)
BILIRUBIN DIRECT: 0.3 mg/dL (ref 0.1–0.5)
BILIRUBIN INDIRECT: 1.5 mg/dL — AB (ref 0.3–0.9)
BILIRUBIN TOTAL: 1.8 mg/dL — AB (ref 0.3–1.2)
Total Protein: 7.3 g/dL (ref 6.5–8.1)

## 2015-08-11 LAB — URINE MICROSCOPIC-ADD ON: RBC / HPF: NONE SEEN RBC/hpf (ref 0–5)

## 2015-08-11 MED ORDER — DEXTROSE 5 % IV SOLN
1.0000 g | INTRAVENOUS | Status: DC
  Administered 2015-08-12: 1 g via INTRAVENOUS
  Filled 2015-08-11: qty 10

## 2015-08-11 MED ORDER — SODIUM CHLORIDE 0.9 % IV BOLUS (SEPSIS)
500.0000 mL | Freq: Once | INTRAVENOUS | Status: AC
  Administered 2015-08-11: 500 mL via INTRAVENOUS

## 2015-08-11 MED ORDER — BISACODYL 10 MG RE SUPP
10.0000 mg | Freq: Once | RECTAL | Status: AC
  Administered 2015-08-11: 10 mg via RECTAL
  Filled 2015-08-11: qty 1

## 2015-08-11 MED ORDER — SODIUM CHLORIDE 0.9 % IV SOLN
INTRAVENOUS | Status: AC
  Administered 2015-08-11: 23:00:00 via INTRAVENOUS

## 2015-08-11 MED ORDER — BISACODYL 10 MG RE SUPP: 10.0000 mg | Freq: Every day | RECTAL | Status: DC | PRN

## 2015-08-11 MED ORDER — POLYETHYLENE GLYCOL 3350 17 G PO PACK: 17.0000 g | PACK | Freq: Every day | ORAL | Status: DC

## 2015-08-11 MED ORDER — SODIUM CHLORIDE 0.9 % IV SOLN
INTRAVENOUS | Status: DC
  Administered 2015-08-11 – 2015-08-12 (×3): via INTRAVENOUS

## 2015-08-11 MED ORDER — FLEET ENEMA 7-19 GM/118ML RE ENEM
1.0000 | ENEMA | Freq: Once | RECTAL | Status: AC
  Administered 2015-08-11: 1 via RECTAL
  Filled 2015-08-11: qty 1

## 2015-08-11 MED ORDER — HYDRALAZINE HCL 20 MG/ML IJ SOLN: 10.0000 mg | INTRAMUSCULAR | Status: DC | PRN

## 2015-08-11 MED ORDER — DEXTROSE 5 % IV SOLN
1.0000 g | Freq: Once | INTRAVENOUS | Status: AC
  Administered 2015-08-11: 1 g via INTRAVENOUS
  Filled 2015-08-11: qty 10

## 2015-08-11 MED ORDER — MORPHINE SULFATE (CONCENTRATE) 10 MG/0.5ML PO SOLN
10.0000 mg | ORAL | Status: DC | PRN
  Administered 2015-08-11 – 2015-08-12 (×2): 10 mg via ORAL
  Filled 2015-08-11 (×2): qty 0.5

## 2015-08-11 NOTE — ED Provider Notes (Signed)
CSN: [id number]     Arrival date & time 08/11/15  1558 History   First MD Initiated Contact with Patient 08/11/15 1610     Chief Complaint  Patient presents with  . Multiple Complaints   . Failure To Thrive     (Consider location/radiation/quality/duration/timing/severity/associated sxs/prior Treatment) HPI   Rose Sullivan is a 82 y.o. female who presents for evaluation of decreased oral intake, no recent bowel movements, and concern for urinary tract infection. She is unable to give any history. She is currently receiving hospice care at home, and was recently evaluated by palliative care, who encouraged the daughter to change her medications, discontinuing many and initiating comfort care, with oral medications. The daughter speaks with the patient and is interested in such treatments as IV fluids, IV antibiotics, medications to improve stooling, and is hopeful that these will improve the patient's status so she can get back to eating. The daughter has contemplated initiating a feeding tube for the patient's nutrition, but is holding off on that decision until she meets with a new primary care provider next week. She reportedly has not had a bowel movement in 2 weeks. Patient also scratched an area on her bottom yesterday and the daughter noticed a wound on that area since then.    Level V caveat- altered mental status   Past Medical History  Diagnosis Date  . HYPERLIPIDEMIA   . BLINDNESS, [place], [place] DEFINITION 11/25/2006  . HYPERTENSION   . PAF (paroxysmal atrial fibrillation) ([place])     a. started on amio 05/2015 by IM.  Marland Kitchen GERD 02/11/2007  . LOW BACK PAIN, CHRONIC 11/25/2006  . BACK PAIN 12/10/2007  . Pain in Soft Tissues of Limb 02/11/2007  . OSTEOPENIA 02/11/2007  . GAIT DISTURBANCE 12/10/2007  . Abdominal pain, left lower quadrant 04/01/2007  . BREAST CANCER, HX OF 11/25/2006  . Nocturia 05/18/2010  . Hypersomnia 05/18/2010  . Impaired glucose tolerance     a. hx of diet  controlled DM per chart.  . Allergic rhinitis, cause unspecified 07/23/2012  . Anxiety state, unspecified 02/04/2013  . Cancer ([place])   . CKD (chronic kidney disease), stage II     CKD II-III per labs  . Atrial flutter ([place])     a. s/p remote ablation.  . SVT (supraventricular tachycardia) ([place])   . Obesity   . Shortness of breath   . Stroke [facility])     a. Stroke 03/2015 with readmission 03/2015 for extension of stroke felt 2/2 hypotension from UTI.  b. Adm 05/2015 for encephalopathy encephalopathy secondary to left occipital stroke extension (not afib cardioembolic pattern, more likely related to hypotension from dehydration/UTI).   Past Surgical History  Procedure Laterality Date  . Dilation and curettage of uterus    . S/p mastectomy Right   . Appendectomy    . Cataract extraction    . Knee surgery Right    Family History  Problem Relation Age of Onset  . Hypertension Brother   . Lung cancer Brother   . Colon cancer Father   . COPD Brother   . Stroke Mother   . Colon cancer Father    Social History  Substance Use Topics  . Smoking status: Never Smoker   . Smokeless tobacco: Never Used     Comment: quit 40 years ago  . Alcohol Use: No   OB History    No data available     Review of Systems  Unable to perform ROS: Mental status change  Allergies  Trazodone and nefazodone  Home Medications   Prior to Admission medications   Medication Sig Start Date End Date Taking? Authorizing Provider  Diclofenac Sodium (VOLTAREN EX) Apply 1 application topically daily as needed (pain).   Yes Historical Provider, MD  Morphine Sulfate (MORPHINE CONCENTRATE) 10 MG/0.5ML SOLN concentrated solution Take 0.13-0.25 mLs (2.6-5 mg total) by mouth every 4 (four) hours as needed for severe pain or shortness of breath. Patient taking differently: Take 10 mg by mouth every 4 (four) hours as needed for severe pain or shortness of breath.  07/19/15  Yes Theodis Blaze, MD  amantadine  (SYMMETREL) 50 MG/5ML solution 100mg  bid taking bid at 8am and 2pm. Patient not taking: Reported on 08/11/2015 07/14/15   Rosalin Hawking, MD  haloperidol (HALDOL) 2 MG/ML solution Take 0.5 mLs (1 mg total) by mouth every 6 (six) hours as needed for agitation (anxiety). Patient not taking: Reported on 08/11/2015 07/19/15   Theodis Blaze, MD  oxyCODONE-acetaminophen (PERCOCET) 7.5-325 MG tablet Take 1 tablet by mouth every 8 (eight) hours as needed. Patient not taking: Reported on 08/11/2015 06/03/15   Bayard Hugger, NP   BP 173/72 mmHg  Pulse 64  Temp(Src) 98.3 F (36.8 C) (Rectal)  Resp 18  SpO2 92% Physical Exam  Constitutional: She appears well-developed.  Elderly, frail  HENT:  Head: Normocephalic and atraumatic.  Right Ear: External ear normal.  Left Ear: External ear normal.  Dry oral mucous membranes.  Eyes:  Congenital blindness with atrophied eyes.  Neck: Normal range of motion and phonation normal. Neck supple.  Cardiovascular: Normal rate, regular rhythm and normal heart sounds.   Pulmonary/Chest: Effort normal and breath sounds normal. She exhibits no bony tenderness.  Abdominal: Soft. She exhibits no mass. There is tenderness (Diffuse, mild). There is no rebound and no guarding.  Genitourinary:  Moderate fecal impaction with brown stool. Patient disimpacted during the examination, by me. A small amount of blood was obtained, during the disimpaction.  Musculoskeletal: Normal range of motion.  Neurological: She is alert. No cranial nerve deficit or sensory deficit. She exhibits normal muscle tone. Coordination normal.  Patient nonverbal.  Skin: Skin is warm, dry and intact.  Psychiatric: She has a normal mood and affect. Her behavior is normal.  Nursing note and vitals reviewed.   ED Course  Procedures (including critical care time) Medications  0.9 %  sodium chloride infusion ( Intravenous New Bag/Given 08/11/15 2024)  sodium chloride 0.9 % bolus 500 mL (0 mLs Intravenous  Stopped 08/11/15 1740)  bisacodyl (DULCOLAX) suppository 10 mg (10 mg Rectal Given 08/11/15 1710)  sodium phosphate (FLEET) 7-19 GM/118ML enema 1 enema (1 enema Rectal Given 08/11/15 1710)  cefTRIAXone (ROCEPHIN) 1 g in dextrose 5 % 50 mL IVPB (0 g Intravenous Stopped 08/11/15 2002)    Patient Vitals for the past 24 hrs:  BP Temp Temp src Pulse Resp SpO2  08/11/15 2034 - 98.3 F (36.8 C) Rectal - - -  08/11/15 2030 - - - 64 - 92 %  08/11/15 2024 173/72 mmHg - - 70 - 93 %  08/11/15 2015 173/72 mmHg - - - - -  08/11/15 1930 - - - 65 - 96 %  08/11/15 1843 175/78 mmHg - - 68 18 96 %  08/11/15 1605 145/76 mmHg 97.7 F (36.5 C) Oral 79 16 90 %  08/11/15 1604 - - - - - 97 %      8:45 PM-Consult complete with Hospitalist. Patient case explained and discussed.  He agrees to admit patient for further evaluation and treatment. Call ended at 21:05  [place] - Abnormal; Notable for the following:    Sodium 147 (*)    BUN 40 (*)    Creatinine, Ser 1.37 (*)    GFR calc non Af Amer 35 (*)    GFR calc Af Amer 41 (*)    All other components within normal limits  CBC WITH DIFFERENTIAL/PLATELET - Abnormal; Notable for the following:    WBC 11.3 (*)    Neutro Abs 8.8 (*)    All other components within normal limits  URINALYSIS, ROUTINE W REFLEX MICROSCOPIC (NOT AT [facility]) - Abnormal; Notable for the following:    Color, Urine AMBER (*)    APPearance TURBID (*)    Bilirubin Urine LARGE (*)    Ketones, ur 15 (*)    Protein, ur 30 (*)    Nitrite POSITIVE (*)    Leukocytes, UA SMALL (*)    All other components within normal limits  URINE MICROSCOPIC-ADD ON - Abnormal; Notable for the following:    Squamous Epithelial / LPF 6-30 (*)    Bacteria, UA MANY (*)    Casts GRANULAR CAST (*)    Crystals CA OXALATE CRYSTALS (*)    All other components within normal limits  URINE CULTURE    Imaging Review No results found. I have personally reviewed and evaluated  these images and lab results as part of my medical decision-making.   EKG Interpretation None      MDM   Final diagnoses:  Urinary tract infection without hematuria, site unspecified  Altered mental status, unspecified altered mental status type  Sacral pressure sore, stage III (HCC)  Palliative care patient  Fecal impaction (HCC)  Constipation, unspecified constipation type  AKI (acute kidney injury) ([place])    Palliative care patient with lethargy, and decreased responsiveness from baseline. UTI with mild AKI, related to decreased oral intake. Fecal impaction and constipation likely contributing to malaise. No diagnosis, syncopal pressure sore, appears to be stage III. Wound has not been assessed yet, by the Wound Care staff.  Nursing Notes Reviewed/ Care Coordinated, and agree without changes. Applicable Imaging Reviewed.  Interpretation of Laboratory Data incorporated into ED treatment  Plan: Admit    Daleen Bo, MD 08/12/15 [phone]

## 2015-08-11 NOTE — ED Notes (Signed)
Wentz at bedside. 

## 2015-08-11 NOTE — ED Notes (Signed)
Pt is seen with advanced home care but stated would unable to address daughter's concerns immediately; would take a week.  With assessment daughter concern for possible dehydration, possible infection related pressure ulcer to tail bone, possible UTI, and constipation (last BM 14 days ago).

## 2015-08-11 NOTE — ED Notes (Signed)
Rose Sullivan at bedside

## 2015-08-11 NOTE — ED Notes (Signed)
Report given to Al on 3W

## 2015-08-11 NOTE — ED Notes (Signed)
Bed: [id number] Expected date:  Expected time:  Means of arrival:  Comments: Ems 82 f dehydration/ pain management

## 2015-08-11 NOTE — ED Notes (Signed)
Pt had brown watery discharge from her bottom after enema was given, no formed stool

## 2015-08-11 NOTE — ED Notes (Signed)
Per EMS daughter [facility]) request pt evaluation of pressure ulcer to tailbone, dehydration related to unable to take PO without aspirating, pain management (last dose of 0.5 mg morphine at 1200).

## 2015-08-11 NOTE — H&P (Signed)
History and Physical    Rose Sullivan [id number] DOB: 1934/01/29 DOA: 08/11/2015   PCP: [name], MD Chief Complaint:  Chief Complaint  Patient presents with  . Multiple Complaints   . Failure To Thrive    HPI: Rose Sullivan is a 82 y.o. female with medical history significant of 3 prior strokes, most recently had a fairly significant one just last month.  She was ultimately sent home on hospice.  History comes from daughter and chart as patient is unable to provide any.  She presents to the ED for evaluation of decreased oral intake, no recent bowel movements, and concern for urinary tract infection. She is unable to give any history. She is currently receiving hospice care at home, and was recently evaluated by palliative care, who encouraged the daughter to change her medications, discontinuing many and initiating comfort care, with oral medications. The daughter speaks with the patient and is interested in such treatments as IV fluids, IV antibiotics, medications to improve stooling, and is hopeful that these will improve the patient's status so she can get back to eating. The daughter has contemplated initiating a feeding tube for the patient's nutrition, but is holding off on that decision until she meets with a new primary care provider next week. She reportedly has not had a bowel movement in 2 weeks. Patient also scratched an area on her bottom yesterday and the daughter noticed a wound on that area since then.  ED Course: Found to have UTI and started on rocephin, found to have fecal impaction manually disimpacted and given dulcolax enema, found to have apparent stage 3 decubitus.  Review of Systems: unable to perform due to AMS.   Past Medical History  Diagnosis Date  . HYPERLIPIDEMIA   . BLINDNESS, [place], [place] DEFINITION 11/25/2006  . HYPERTENSION   . PAF (paroxysmal atrial fibrillation) ([place])     a. started on amio 05/2015 by IM.  Marland Kitchen GERD 02/11/2007  . LOW BACK  PAIN, CHRONIC 11/25/2006  . BACK PAIN 12/10/2007  . Pain in Soft Tissues of Limb 02/11/2007  . OSTEOPENIA 02/11/2007  . GAIT DISTURBANCE 12/10/2007  . Abdominal pain, left lower quadrant 04/01/2007  . BREAST CANCER, HX OF 11/25/2006  . Nocturia 05/18/2010  . Hypersomnia 05/18/2010  . Impaired glucose tolerance     a. hx of diet controlled DM per chart.  . Allergic rhinitis, cause unspecified 07/23/2012  . Anxiety state, unspecified 02/04/2013  . Cancer ([place])   . CKD (chronic kidney disease), stage II     CKD II-III per labs  . Atrial flutter ([place])     a. s/p remote ablation.  . SVT (supraventricular tachycardia) ([place])   . Obesity   . Shortness of breath   . Stroke [facility])     a. Stroke 03/2015 with readmission 03/2015 for extension of stroke felt 2/2 hypotension from UTI.  b. Adm 05/2015 for encephalopathy encephalopathy secondary to left occipital stroke extension (not afib cardioembolic pattern, more likely related to hypotension from dehydration/UTI).    Past Surgical History  Procedure Laterality Date  . Dilation and curettage of uterus    . S/p mastectomy Right   . Appendectomy    . Cataract extraction    . Knee surgery Right      reports that she has never smoked. She has never used smokeless tobacco. She reports that she does not drink alcohol or use illicit drugs.  Allergies  Allergen Reactions  . Trazodone And Nefazodone Other (See Comments)  hallucinations    Family History  Problem Relation Age of Onset  . Hypertension Brother   . Lung cancer Brother   . Colon cancer Father   . COPD Brother   . Stroke Mother   . Colon cancer Father       Prior to Admission medications   Medication Sig Start Date End Date Taking? Authorizing Provider  Diclofenac Sodium (VOLTAREN EX) Apply 1 application topically daily as needed (pain).   Yes Historical Provider, MD  Morphine Sulfate (MORPHINE CONCENTRATE) 10 MG/0.5ML SOLN concentrated solution Take 0.13-0.25 mLs (2.6-5 mg total)  by mouth every 4 (four) hours as needed for severe pain or shortness of breath. Patient taking differently: Take 10 mg by mouth every 4 (four) hours as needed for severe pain or shortness of breath.  07/19/15  Yes Theodis Blaze, MD    Physical Exam: Filed Vitals:   08/11/15 2024 08/11/15 2030 08/11/15 2034 08/11/15 2137  BP: 173/72   200/78  Pulse: 70 64  65  Temp:   98.3 F (36.8 C)   TempSrc:   Rectal   Resp:    16  SpO2: 93% 92%  98%      Constitutional: NAD, Elderly, Frail Eyes: Blind both eyes ENMT: Mucous membranes are Dry. Posterior pharynx clear of any exudate or lesions.Normal dentition.  Neck: normal, supple, no masses, no thyromegaly Respiratory: clear to auscultation bilaterally, no wheezing, no crackles. Normal respiratory effort. No accessory muscle use.  Cardiovascular: Regular rate and rhythm, no murmurs / rubs / gallops. No extremity edema. 2+ pedal pulses. No carotid bruits.  Abdomen: mild diffuse tenderness, no masses palpated. No hepatosplenomegaly. Bowel sounds positive.  Musculoskeletal: no clubbing / cyanosis. No joint deformity upper and lower extremities. Good ROM, no contractures. Normal muscle tone.  Skin: Stage 3 sacral decubitus Neurologic: Non-verbal, does obey commands to squeeze with BUE, is perhaps 3/5 on the R and 5/5 on the left.  Baseline since last stroke per daughter. Psychiatric: Non-verbal   Labs on Admission: I have personally reviewed following labs and imaging studies  CBC:  Recent Labs Lab 08/11/15 1652  WBC 11.3*  NEUTROABS 8.8*  HGB 13.3  HCT 41.2  MCV 97.4  PLT 99991111   Basic Metabolic Panel:  Recent Labs Lab 08/11/15 1652  NA 147*  K 4.2  CL 110  CO2 22  GLUCOSE 86  BUN 40*  CREATININE 1.37*  CALCIUM 9.2   GFR: CrCl cannot be calculated (Unknown ideal weight.). Liver Function Tests: No results for input(s): AST, ALT, ALKPHOS, BILITOT, PROT, ALBUMIN in the last 168 hours. No results for input(s): LIPASE, AMYLASE  in the last 168 hours. No results for input(s): AMMONIA in the last 168 hours. Coagulation Profile: No results for input(s): INR, PROTIME in the last 168 hours. Cardiac Enzymes: No results for input(s): CKTOTAL, CKMB, CKMBINDEX, TROPONINI in the last 168 hours. BNP (last 3 results) No results for input(s): PROBNP in the last 8760 hours. HbA1C: No results for input(s): HGBA1C in the last 72 hours. CBG: No results for input(s): GLUCAP in the last 168 hours. Lipid Profile: No results for input(s): CHOL, HDL, LDLCALC, TRIG, CHOLHDL, LDLDIRECT in the last 72 hours. Thyroid Function Tests: No results for input(s): TSH, T4TOTAL, FREET4, T3FREE, THYROIDAB in the last 72 hours. Anemia Panel: No results for input(s): VITAMINB12, FOLATE, FERRITIN, TIBC, IRON, RETICCTPCT in the last 72 hours. Urine analysis:    Component Value Date/Time   COLORURINE AMBER* 08/11/2015 1709   APPEARANCEUR TURBID* 08/11/2015  1709   LABSPEC 1.028 08/11/2015 1709   PHURINE 5.0 08/11/2015 1709   GLUCOSEU NEGATIVE 08/11/2015 1709   GLUCOSEU NEGATIVE 06/10/2015 1631   HGBUR NEGATIVE 08/11/2015 1709   BILIRUBINUR LARGE* 08/11/2015 1709   BILIRUBINUR negative 06/14/2015 1403   KETONESUR 15* 08/11/2015 1709   PROTEINUR 30* 08/11/2015 1709   PROTEINUR 2+ 06/14/2015 1403   UROBILINOGEN negative 06/14/2015 1403   UROBILINOGEN 1.0 06/10/2015 1631   NITRITE POSITIVE* 08/11/2015 1709   NITRITE negative 06/14/2015 1403   LEUKOCYTESUR SMALL* 08/11/2015 1709   Sepsis Labs: @LABRCNTIP (procalcitonin:4,lacticidven:4) )No results found for this or any previous visit (from the past 240 hour(s)).   Radiological Exams on Admission: No results found.  EKG: Independently reviewed.  Assessment/Plan Principal Problem:   Failure to thrive in adult Active Problems:   AKI (acute kidney injury) ([place])   UTI (lower urinary tract infection)   Dehydration   Failure to thrive in adult, lethargy -  Due to dehydration, UTI with  AKI, decreased PO intake, constipation, all contributing  Daughter declines further CNS imaging for stroke or hemorrhage at this point.  As such this is not being ordered.  UTI - daughter requests ABx at this point, have ordered rocephin  AKI - appears due to dehydration, rehydrating patient with IVF, recheck BMP in AM  Bili in UA - checking LFTs  HTN - will give a small amount of IV hydralazine to try and get SBP below 180, she had been taken off of BP meds by hospice.  Daughter still would like to try and get patient better though and does request this due to BPs running in the 200s (see CNS imaging decline note above).  Constipation -  Disimpacted in ED with  Dulcolax enema PRN, avoid phosphate enema in patient with AKI  Daily miralax started  H/o A.Fib -  Not in RVR currently  Daughter declines anticoagulation   DVT prophylaxis: Declined by daughter, patient is mostly comfort measures Code Status: DNR Family Communication: Daughter at bedside Consults called: None, call pal care in AM Admission status: Admit to obs   Etta Quill DO [facility] Pager [phone] from 7PM-7AM  If 7AM-7PM, please contact the day physician for the patient [email or web address] Password [facility]  08/11/2015, 9:58 PM

## 2015-08-12 DIAGNOSIS — E876 Hypokalemia: Secondary | ICD-10-CM | POA: Diagnosis present

## 2015-08-12 DIAGNOSIS — K219 Gastro-esophageal reflux disease without esophagitis: Secondary | ICD-10-CM | POA: Diagnosis present

## 2015-08-12 DIAGNOSIS — I480 Paroxysmal atrial fibrillation: Secondary | ICD-10-CM | POA: Diagnosis present

## 2015-08-12 DIAGNOSIS — K5641 Fecal impaction: Secondary | ICD-10-CM | POA: Diagnosis present

## 2015-08-12 DIAGNOSIS — I129 Hypertensive chronic kidney disease with stage 1 through stage 4 chronic kidney disease, or unspecified chronic kidney disease: Secondary | ICD-10-CM | POA: Diagnosis present

## 2015-08-12 DIAGNOSIS — Z66 Do not resuscitate: Secondary | ICD-10-CM | POA: Diagnosis present

## 2015-08-12 DIAGNOSIS — G9340 Encephalopathy, unspecified: Secondary | ICD-10-CM | POA: Diagnosis present

## 2015-08-12 DIAGNOSIS — L8990 Pressure ulcer of unspecified site, unspecified stage: Secondary | ICD-10-CM | POA: Diagnosis not present

## 2015-08-12 DIAGNOSIS — H548 Legal blindness, as defined in USA: Secondary | ICD-10-CM | POA: Diagnosis present

## 2015-08-12 DIAGNOSIS — Z8673 Personal history of transient ischemic attack (TIA), and cerebral infarction without residual deficits: Secondary | ICD-10-CM | POA: Diagnosis not present

## 2015-08-12 DIAGNOSIS — Z515 Encounter for palliative care: Secondary | ICD-10-CM | POA: Diagnosis present

## 2015-08-12 DIAGNOSIS — R1310 Dysphagia, unspecified: Secondary | ICD-10-CM | POA: Diagnosis present

## 2015-08-12 DIAGNOSIS — N390 Urinary tract infection, site not specified: Secondary | ICD-10-CM | POA: Diagnosis not present

## 2015-08-12 DIAGNOSIS — F411 Generalized anxiety disorder: Secondary | ICD-10-CM | POA: Diagnosis present

## 2015-08-12 DIAGNOSIS — N179 Acute kidney failure, unspecified: Secondary | ICD-10-CM | POA: Diagnosis not present

## 2015-08-12 DIAGNOSIS — R627 Adult failure to thrive: Secondary | ICD-10-CM | POA: Diagnosis not present

## 2015-08-12 DIAGNOSIS — B952 Enterococcus as the cause of diseases classified elsewhere: Secondary | ICD-10-CM | POA: Diagnosis present

## 2015-08-12 DIAGNOSIS — N183 Chronic kidney disease, stage 3 (moderate): Secondary | ICD-10-CM | POA: Diagnosis present

## 2015-08-12 DIAGNOSIS — E785 Hyperlipidemia, unspecified: Secondary | ICD-10-CM | POA: Diagnosis present

## 2015-08-12 DIAGNOSIS — E1122 Type 2 diabetes mellitus with diabetic chronic kidney disease: Secondary | ICD-10-CM | POA: Diagnosis present

## 2015-08-12 DIAGNOSIS — Z853 Personal history of malignant neoplasm of breast: Secondary | ICD-10-CM | POA: Diagnosis not present

## 2015-08-12 DIAGNOSIS — E860 Dehydration: Secondary | ICD-10-CM | POA: Diagnosis not present

## 2015-08-12 DIAGNOSIS — L89153 Pressure ulcer of sacral region, stage 3: Secondary | ICD-10-CM | POA: Diagnosis present

## 2015-08-12 DIAGNOSIS — E870 Hyperosmolality and hypernatremia: Secondary | ICD-10-CM | POA: Diagnosis present

## 2015-08-12 DIAGNOSIS — Z9011 Acquired absence of right breast and nipple: Secondary | ICD-10-CM | POA: Diagnosis not present

## 2015-08-12 DIAGNOSIS — I63532 Cerebral infarction due to unspecified occlusion or stenosis of left posterior cerebral artery: Secondary | ICD-10-CM | POA: Diagnosis not present

## 2015-08-12 LAB — BASIC METABOLIC PANEL
Anion gap: 9 (ref 5–15)
BUN: 26 mg/dL — AB (ref 6–20)
CALCIUM: 8 mg/dL — AB (ref 8.9–10.3)
CO2: 20 mmol/L — ABNORMAL LOW (ref 22–32)
CREATININE: 0.86 mg/dL (ref 0.44–1.00)
Chloride: 119 mmol/L — ABNORMAL HIGH (ref 101–111)
GFR calc Af Amer: >60 mL/min (ref >60)
GLUCOSE: 68 mg/dL (ref 65–99)
Potassium: 3.5 mmol/L (ref 3.5–5.1)
Sodium: 148 mmol/L — ABNORMAL HIGH (ref 135–145)

## 2015-08-12 LAB — CBC
HCT: 35.9 % — ABNORMAL LOW (ref 36.0–46.0)
Hemoglobin: 11.5 g/dL — ABNORMAL LOW (ref 12.0–15.0)
MCH: 31.6 pg (ref 26.0–34.0)
MCHC: 32 g/dL (ref 30.0–36.0)
MCV: 98.6 fL (ref 78.0–100.0)
PLATELETS: 223 10*3/uL (ref 150–400)
RBC: 3.64 MIL/uL — ABNORMAL LOW (ref 3.87–5.11)
RDW: 13.7 % (ref 11.5–15.5)
WBC: 9 10*3/uL (ref 4.0–10.5)

## 2015-08-12 MED ORDER — RESOURCE THICKENUP CLEAR PO POWD
ORAL | Status: DC | PRN
Start: 2015-08-12 — End: 2015-08-17
  Filled 2015-08-12: qty 125

## 2015-08-12 MED ORDER — DEXTROSE-NACL 5-0.45 % IV SOLN
INTRAVENOUS | Status: AC
  Administered 2015-08-12: 18:00:00 via INTRAVENOUS

## 2015-08-12 MED ORDER — SENNOSIDES-DOCUSATE SODIUM 8.6-50 MG PO TABS
2.0000 | ORAL_TABLET | Freq: Every day | ORAL | Status: DC
  Administered 2015-08-12 – 2015-08-15 (×4): 2 via ORAL
  Filled 2015-08-12 (×5): qty 2

## 2015-08-12 MED ORDER — MORPHINE SULFATE (CONCENTRATE) 10 MG/0.5ML PO SOLN
5.0000 mg | ORAL | Status: DC | PRN
  Administered 2015-08-12 – 2015-08-16 (×6): 5 mg via ORAL
  Filled 2015-08-12 (×6): qty 0.5

## 2015-08-12 MED ORDER — COLLAGENASE 250 UNIT/GM EX OINT
TOPICAL_OINTMENT | Freq: Every day | CUTANEOUS | Status: DC
  Administered 2015-08-12 – 2015-08-16 (×4): via TOPICAL
  Filled 2015-08-12: qty 30

## 2015-08-12 NOTE — Consult Note (Signed)
[place] Nurse wound consult note Reason for Consult: Unstageable pressure injury at the intragluteal cleft Wound type:Pressure Pressure Ulcer POA: Yes Measurement:2.5cm x 1.5cm with depth obscured by the presence of firmly adherent necrotic tissue Wound bed: obscured by the presence of firmly adherent white necrotic slough Drainage (amount, consistency, odor) small amount of light yellow exudate Periwound:intact Dressing procedure/placement/frequency: Patient would not be a candidate for surgical debridement of this area, even conservative sharp.  Instead, enzymatic debridement using collagenase (Santyl) is employed for its efficacy and ease of use-even by lay people (family) with oversight from the [facility] and the PCP. Patient is on a therapeutic mattress with low air loss feature and I am providing bilateral heel pressure redistribution boots to prevent ulceration at the heel and malleolar surfaces. Nutrition is a factor in the recovery of the skin and I have explained this as best I am able to her daughter who continues to be very hopeful for her mother's recovery. [place] nursing team will not follow, but will remain available to this patient, the nursing and medical teams.  Please re-consult if needed. Thanks, Maudie Flakes, MSN, RN, [place], Ar[facility] Abbott  Pager# [phone]

## 2015-08-12 NOTE — Progress Notes (Signed)
Patient daughter at bedside.  Has refused lab to draw blood citing concern with agitation.

## 2015-08-12 NOTE — Progress Notes (Signed)
OT Cancellation Note  Patient Details Name: Rose Sullivan MRN: [id number] DOB: [birth year]   Cancelled Treatment:    Reason Eval/Treat Not Completed: Other (comment) -- per chart review, patient receiving hospice care at home and appears dependent with ADLs. No acute OT needs apparent; therefore, will sign off. Thank you.  Jashun Puertas A 08/12/2015, 8:15 AM

## 2015-08-12 NOTE — Progress Notes (Addendum)
Per [facility] rep, pt was active with them for [facility] services prior to admission. This CM called [facility] and confirmed that hospice services were revoked by pt daughter on 08/03/15.  Unsure at this time of disposition plan. PT/OT evals and Palliative care ordered.  CM will continue to follow and assist as needed. Marney Doctor RN,BSN,NCM [phone]

## 2015-08-12 NOTE — Progress Notes (Signed)
PROGRESS NOTE    Rose Sullivan  [id number] DOB: November 23, 1933 DOA: 08/11/2015  PCP: [name], MD   Brief Narrative:  Rose Sullivan is a 82 y.o. female with medical history significant of 3 prior strokes, most recently had a fairly significant one just last month. She was ultimately sent home on hospice. History comes from daughter and chart as patient is unable to provide any.  Subjective: Non-verbal  Assessment & Plan:   Principal Problem: Dehydration/ hypernatermia - poor PO intake since 7/4 per daughter - improving - cont IVF but change to D5 1/2 NS and decrease rate  Active Problems:   UTI (lower urinary tract infection) - cont antibiotics and d/u culture  Constipation - due to using Morphine without laxatives - disimpacted in ER - Miralax ordered  Dysphagia - likely is aspiration- CXR does not show pneumonia - diet ordered by SLP- nectar thick liquids     Failure to thrive in adult - on hospice at home but really started to decline about 10 days ago- albumin level is normal  - dietician to discuss nutrition options to increase caloric intake - daughter states Feeding tube would be a last resort    Pressure ulcer - WOC eval- will need air mattress at home  DVT prophylaxis:  SCD Code Status: DNR Family Communication: daughter Disposition Plan: home with hospice tomorrow  Consultants:    Procedures:    Antimicrobials:  Anti-infectives    Start     Dose/Rate Route Frequency Ordered Stop   08/12/15 1800  cefTRIAXone (ROCEPHIN) 1 g in dextrose 5 % 50 mL IVPB     1 g 100 mL/hr over 30 Minutes Intravenous Every 24 hours 08/11/15 2116     08/11/15 1915  cefTRIAXone (ROCEPHIN) 1 g in dextrose 5 % 50 mL IVPB     1 g 100 mL/hr over 30 Minutes Intravenous  Once 08/11/15 1909 08/11/15 2002       Objective: Filed Vitals:   08/11/15 2159 08/11/15 2215 08/12/15 0611 08/12/15 1309  BP: 167/79 155/87 141/89 158/67  Pulse: 65 67 71 71  Temp:  97.6  F (36.4 C) 97.8 F (36.6 C) 98.1 F (36.7 C)  TempSrc:  Oral Oral Oral  Resp: 16 17 17 17   Height:  5' (1.524 m)    Weight:  66.815 kg (147 lb 4.8 oz)    SpO2: 96% 98% 98% 98%    Intake/Output Summary (Last 24 hours) at 08/12/15 1744 Last data filed at 08/12/15 1715  Gross per 24 hour  Intake      0 ml  Output      0 ml  Net      0 ml   Filed Weights   08/11/15 2215  Weight: 66.815 kg (147 lb 4.8 oz)    Examination: General exam: Appears comfortable - asleep HEENT: PERRLA,not opening mouth Respiratory system: Clear to auscultation. Respiratory effort normal. Cardiovascular system: S1 & S2 heard, RRR.  No murmurs  Gastrointestinal system: Abdomen soft, non-tender, nondistended. Normal bowel sound. No organomegaly Central nervous system: Alert and oriented. No focal neurological deficits. Extremities: No cyanosis, clubbing or edema Skin: No rashes or ulcers Psychiatry:  Mood & affect appropriate.     Data Reviewed: I have personally reviewed following labs and imaging studies  CBC:  Recent Labs Lab 08/11/15 1652 08/12/15 1302  WBC 11.3* 9.0  NEUTROABS 8.8*  --   HGB 13.3 11.5*  HCT 41.2 35.9*  MCV 97.4 98.6  PLT 271 223  Basic Metabolic Panel:  Recent Labs Lab 08/11/15 1652 08/12/15 1302  NA 147* 148*  K 4.2 3.5  CL 110 119*  CO2 22 20*  GLUCOSE 86 68  BUN 40* 26*  CREATININE 1.37* 0.86  CALCIUM 9.2 8.0*   GFR: Estimated Creatinine Clearance: 44.5 mL/min (by C-G formula based on Cr of 0.86). Liver Function Tests:  Recent Labs Lab 08/11/15 1700  AST 36  ALT 23  ALKPHOS 65  BILITOT 1.8*  PROT 7.3  ALBUMIN 3.6   No results for input(s): LIPASE, AMYLASE in the last 168 hours. No results for input(s): AMMONIA in the last 168 hours. Coagulation Profile: No results for input(s): INR, PROTIME in the last 168 hours. Cardiac Enzymes: No results for input(s): CKTOTAL, CKMB, CKMBINDEX, TROPONINI in the last 168 hours. BNP (last 3 results) No  results for input(s): PROBNP in the last 8760 hours. HbA1C: No results for input(s): HGBA1C in the last 72 hours. CBG: No results for input(s): GLUCAP in the last 168 hours. Lipid Profile: No results for input(s): CHOL, HDL, LDLCALC, TRIG, CHOLHDL, LDLDIRECT in the last 72 hours. Thyroid Function Tests: No results for input(s): TSH, T4TOTAL, FREET4, T3FREE, THYROIDAB in the last 72 hours. Anemia Panel: No results for input(s): VITAMINB12, FOLATE, FERRITIN, TIBC, IRON, RETICCTPCT in the last 72 hours. Urine analysis:    Component Value Date/Time   COLORURINE AMBER* 08/11/2015 1709   APPEARANCEUR TURBID* 08/11/2015 1709   LABSPEC 1.028 08/11/2015 1709   PHURINE 5.0 08/11/2015 1709   GLUCOSEU NEGATIVE 08/11/2015 1709   GLUCOSEU NEGATIVE 06/10/2015 1631   HGBUR NEGATIVE 08/11/2015 1709   BILIRUBINUR LARGE* 08/11/2015 1709   BILIRUBINUR negative 06/14/2015 1403   KETONESUR 15* 08/11/2015 1709   PROTEINUR 30* 08/11/2015 1709   PROTEINUR 2+ 06/14/2015 1403   UROBILINOGEN negative 06/14/2015 1403   UROBILINOGEN 1.0 06/10/2015 1631   NITRITE POSITIVE* 08/11/2015 1709   NITRITE negative 06/14/2015 1403   LEUKOCYTESUR SMALL* 08/11/2015 1709   Sepsis Labs: @LABRCNTIP (procalcitonin:4,lacticidven:4) ) Recent Results (from the past 240 hour(s))  Urine culture     Status: None (Preliminary result)   Collection Time: 08/11/15  5:09 PM  Result Value Ref Range Status   Specimen Description URINE, CATHETERIZED  Final   Special Requests Normal  Final   Culture   Final    CULTURE REINCUBATED FOR BETTER GROWTH Performed at [facility]    Report Status PENDING  Incomplete         Radiology Studies: Dg Chest 2 View  08/11/2015  CLINICAL DATA:  82 year old female with history of stroke presenting with possible aspiration. EXAM: CHEST  2 VIEW COMPARISON:  Chest radiograph dated 07/16/2015 FINDINGS: The two views of the chest demonstrate emphysematous changes of the lungs with  bibasilar atelectatic changes. There is no focal consolidation, pleural effusion, or pneumothorax. The cardiac silhouette is within normal limits with there is osteopenia with degenerative changes of the spine. There is a 3.1 x 3.2 cm sclerotic lesion in the humeral neck which appears grossly similar or minimally increased from prior study. A 1.6 x 1.5 cm right humeral neck sclerotic lesion noted. IMPRESSION: No active cardiopulmonary disease. Electronically Signed   By: Anner Crete M.D.   On: 08/11/2015 22:12      Scheduled Meds: . cefTRIAXone (ROCEPHIN)  IV  1 g Intravenous Q24H  . collagenase   Topical Daily  . polyethylene glycol  17 g Oral Daily   Continuous Infusions: . sodium chloride 125 mL/hr at 08/12/15 1534  Time spent in minutes: 60    Fletcher, MD [facility] Pager: [email or web address] Password [facility] 08/12/2015, 5:44 PM

## 2015-08-12 NOTE — Progress Notes (Signed)
PT Cancellation Note  Patient Details Name: Rose CHEESE MRN: [id number] DOB: 1933-09-26   Cancelled Treatment:    Reason Eval/Treat Not Completed: Other (comment) Pt attempting to work with SLP. Daughter reports pt lethargic today and would not likely mobilize well.  Daughter reports pt has HHPT at home however has not been OOB in 10 days.  Daughter would like therapeutic mattress with low air loss feature for bed at home.  Will attempt to check back tomorrow however daughter reports possible d/c home.  Bailea Beed,KATHrine E 08/12/2015, 2:25 PM Carmelia Bake, PT, DPT 08/12/2015 Pager: [phone]

## 2015-08-12 NOTE — Evaluation (Signed)
Clinical/Bedside Swallow Evaluation Patient Details  Name: Rose Sullivan MRN: [id number] Date of Birth: 1934-01-13  Today's Date: 08/12/2015 Time: SLP Start Time (ACUTE ONLY): 1430 SLP Stop Time (ACUTE ONLY): 1458 SLP Time Calculation (min) (ACUTE ONLY): 28 min  Past Medical History:  Past Medical History  Diagnosis Date  . HYPERLIPIDEMIA   . BLINDNESS, [place], [place] DEFINITION 11/25/2006  . HYPERTENSION   . PAF (paroxysmal atrial fibrillation) ([place])     a. started on amio 05/2015 by IM.  Marland Kitchen GERD 02/11/2007  . LOW BACK PAIN, CHRONIC 11/25/2006  . BACK PAIN 12/10/2007  . Pain in Soft Tissues of Limb 02/11/2007  . OSTEOPENIA 02/11/2007  . GAIT DISTURBANCE 12/10/2007  . Abdominal pain, left lower quadrant 04/01/2007  . BREAST CANCER, HX OF 11/25/2006  . Nocturia 05/18/2010  . Hypersomnia 05/18/2010  . Impaired glucose tolerance     a. hx of diet controlled DM per chart.  . Allergic rhinitis, cause unspecified 07/23/2012  . Anxiety state, unspecified 02/04/2013  . Cancer ([place])   . CKD (chronic kidney disease), stage II     CKD II-III per labs  . Atrial flutter ([place])     a. s/p remote ablation.  . SVT (supraventricular tachycardia) ([place])   . Obesity   . Shortness of breath   . Stroke [facility])     a. Stroke 03/2015 with readmission 03/2015 for extension of stroke felt 2/2 hypotension from UTI.  b. Adm 05/2015 for encephalopathy encephalopathy secondary to left occipital stroke extension (not afib cardioembolic pattern, more likely related to hypotension from dehydration/UTI).   Past Surgical History:  Past Surgical History  Procedure Laterality Date  . Dilation and curettage of uterus    . S/p mastectomy Right   . Appendectomy    . Cataract extraction    . Knee surgery Right    HPI:  82 y.o. female with medical history significant of 3 prior strokes, most recently had a fairly significant one just last month. She was ultimately sent home on hospice. History comes from daughter and chart  as patient is unable to provide any;  Presented to ED d/t decreased oral intake, no recent bowel movements, and concern for urinary tract infection. She is currently receiving hospice care at home, and was recently evaluated by palliative care, who encouraged the daughter to change her medications, discontinuing many and initiating comfort care, with oral medications. The daughter speaks with the patient and is interested in such treatments as IV fluids, IV antibiotics, medications to improve stooling, and is hopeful that these will improve the patient's status so she can get back to eating. The daughter has contemplated initiating a feeding tube for the patient's nutrition, but is holding off on that decision until she meets with a new primary care provider next week. She reportedly has not had a bowel movement in 2 weeks. Patient also scratched an area on her bottom yesterday and the daughter noticed a wound on that area since then; daughter gave extensive hx of pt's swallowing including recent MBS performed 5/17 indicating Dysphagia 1/thin diet to be initiated d/t recent Parkinson's dx which daughter has stated is not accurate.  Daughter informed SLP that pt returned to a Regular/thin diet after home health speech therapy was initiated and recommended via home health SLP, but a f/u MBS had not been completed since upgrade.  Pt is having coughing episodes during po intake per daughter.  Assessment / Plan / Recommendation Clinical Impression   Pt exhibited overt s/s of  aspiration including immediate cough/wet vocal quality after administration of small amounts of thin liquids via spoon/cup (with assistance); nectar-thickened liquids and puree consistencies yielded decreased oral transit, suspected delayed swallow initiation and multiple swallows intermittently depending on volume of consistencies.  Pt was lethargic during BSE and this certainly impacted swallowing accuracy; discussed repeating MBS while pt in  hospital and/or scheduling as an outpatient when pt's mentation has improved d/t increased aspiration risk at current time.  Discussed in detail risk for aspiration without use of Dysphagia 1 (puree)/nectar-thickened liquids utilizing swallowing/aspiration precautions.  Fatigue factor discussed as well d/t overall generalized weakness during po intake and need for smaller meals more frequently provided pt is alert/awake during meals/snacks. Initiate Dysphagia 1/nectar-thickened liquids and ST will f/u for diet tolerance/need for repeat MBS.    Aspiration Risk  Moderate aspiration risk    Diet Recommendation   Dysphagia 1 (puree)/nectar-thickened liquids  Medication Administration: Crushed with puree    Other  Recommendations Oral Care Recommendations: Oral care QID Other Recommendations: Order thickener from pharmacy;Prohibited food (jello, ice cream, thin soups);Remove water pitcher   Follow up Recommendations  Home health SLP;24 hour supervision/assistance    Frequency and Duration min 2x/week  1 week       Prognosis Prognosis for Safe Diet Advancement: Good      Swallow Study   General Date of Onset: 08/11/15 HPI: 82 y.o. female with medical history significant of 3 prior strokes, most recently had a fairly significant one just last month. She was ultimately sent home on hospice. History comes from daughter and chart as patient is unable to provide any. Type of Study: Bedside Swallow Evaluation Diet Prior to this Study: Regular;Thin liquids Temperature Spikes Noted: No Respiratory Status: Room air History of Recent Intubation: No Behavior/Cognition: Cooperative;Lethargic/Drowsy;Requires cueing Oral Cavity Assessment: Dry Oral Care Completed by SLP: Yes Oral Cavity - Dentition: Poor condition;Missing dentition Vision: Impaired for self-feeding Self-Feeding Abilities: Needs assist Patient Positioning: Upright in bed;Other (comment) (Pt grimacing if 90 degrees upright d/t  pain) Baseline Vocal Quality: Low vocal intensity (aphasic) Volitional Cough: Weak Volitional Swallow: Able to elicit    Oral/Motor/Sensory Function Overall Oral Motor/Sensory Function: Generalized oral weakness   Ice Chips Ice chips: Impaired Presentation: Spoon Oral Phase Functional Implications: Prolonged oral transit Pharyngeal Phase Impairments: Suspected delayed Swallow;Multiple swallows   Thin Liquid Thin Liquid: Impaired Presentation: Cup;Spoon Oral Phase Functional Implications: Prolonged oral transit;Oral holding Pharyngeal  Phase Impairments: Suspected delayed Swallow;Multiple swallows;Cough - Immediate    Nectar Thick Nectar Thick Liquid: Impaired Presentation: Spoon Oral phase functional implications: Prolonged oral transit;Oral holding   Honey Thick Honey Thick Liquid: Not tested   Puree Puree: Impaired Presentation: Spoon Oral Phase Functional Implications: Prolonged oral transit Pharyngeal Phase Impairments: Suspected delayed Swallow   Solid      Solid: Not tested    Functional Assessment Tool Used: NOMS Functional Limitations: Swallowing Swallow Current Status [id number]): At least 60 percent but less than 80 percent impaired, limited or restricted Swallow Goal Status [phone]): At least 40 percent but less than 60 percent impaired, limited or restricted   Bita Cartwright,PAT, M.S., CCC-SLP 08/12/2015,3:25 PM

## 2015-08-12 NOTE — Progress Notes (Signed)
Initial Nutrition Assessment  DOCUMENTATION CODES:   Not applicable  INTERVENTION:  -Provided pt's caregiver high protein diet education, counseling. -NDD1 - Nectar Thick per SLP  NUTRITION DIAGNOSIS:   Unintentional weight loss related to poor appetite, lethargy/confusion as evidenced by per patient/family report, percent weight loss.  GOAL:   Patient will meet greater than or equal to 90% of their needs  MONITOR:   PO intake, Labs, I & O's, Weight trends, Skin  REASON FOR ASSESSMENT:   Malnutrition Screening Tool    ASSESSMENT:   Rose Sullivan is a 82 y.o. female with medical history significant of 3 prior strokes, most recently had a fairly significant one just last month. She was ultimately sent home on hospice. History comes from daughter and chart as patient is unable to provide any. She presents to the ED for evaluation of decreased oral intake, no recent bowel movements, and concern for urinary tract infection. She is unable to give any history. She is currently receiving hospice care at home, and was recently evaluated by palliative care, who encouraged the daughter to change her medications, discontinuing many and initiating comfort care, with oral medications  Spoke with Ms. Sanfilippo's daughter at bedside. Pt is tough to wake up, non-verbal but can nod and shake her head. Daughter feeds patient at home, otherwise patient doesn't eat.  Pt currently exhibits a 42#/22% severe wt loss in 5 months.  Nutrition-Focused physical exam completed. Findings are no fat depletion, no muscle depletion, and no edema.   She is at home on hospice, comfort care.  Daughter was considering feeding tube for patient, but per MD and my thoughts, feeding tube is not appropriate. Provided diet education instead for high protein, high calorie nutrition therapy, note to follow. Patient was seen by SLP before my 2nd visit, they have recommended NDD1-Nectar Thick.  Labs and Medications  reviewed.  Diet Order:  Diet Heart Room service appropriate?: Yes; Fluid consistency:: Thin  Skin:  Wound (see comment) (Stg III PU to Sacrum)  Last BM:  7/13 - Manual Disimpaction  Height:   Ht Readings from Last 1 Encounters:  08/11/15 5' (1.524 m)    Weight:   Wt Readings from Last 1 Encounters:  08/11/15 147 lb 4.8 oz (66.815 kg)    Ideal Body Weight:  45.45 kg  BMI:  Body mass index is 28.77 kg/(m^2).  Estimated Nutritional Needs:   Kcal:  1250-1500 calories  Protein:  65-80 grams  Fluid:  >/= 1.25L  EDUCATION NEEDS:   Education needs addressed  Satira Anis. Al Bracewell, MS, RD LDN Inpatient Clinical Dietitian Pager [phone]

## 2015-08-12 NOTE — Plan of Care (Signed)
Problem: Unintentional Weight Loss ([place]-3.2) Goal: Nutrition education Formal process to instruct or train a patient/client in a skill or to impart knowledge to help patients/clients voluntarily manage or modify food choices and eating behavior to maintain or improve health. Outcome: Completed/Met Date Met:  08/12/15  RD consulted for education regarding High-Calorie, High-Protein nutrition therapy.  RD provided "High-Calorie High-Protein Nutrition Therapy" handout from the Academy of Nutrition and Dietetics. Reviewed patient's dietary recall. Provided examples on ways to increase caloric density of foods and beverages frequently consumed by the patient. Also provided ideas to promote variety and to incorporate additional nutrient dense foods into patient's diet. Discussed eating small frequent meals and snacks to assist in increasing overall po intake. Teach back method used.  Expect poor-faire compliance.  Body mass index is 28.77 kg/(m^2). Pt meets criteria for overweight based on current BMI.  Current diet order is heart healthy, patient is consuming approximately unknown% of meals at this time. Labs and medications reviewed. No further nutrition interventions warranted at this time. RD contact information provided. If additional nutrition issues arise, please re-consult RD.   Satira Anis. Patton Rabinovich, MS, RD LDN Inpatient Clinical Dietitian Pager [phone]

## 2015-08-13 DIAGNOSIS — I63532 Cerebral infarction due to unspecified occlusion or stenosis of left posterior cerebral artery: Secondary | ICD-10-CM

## 2015-08-13 LAB — URINE CULTURE
Culture: >=100000 — AB
Special Requests: NORMAL

## 2015-08-13 LAB — BASIC METABOLIC PANEL
Anion gap: 6 (ref 5–15)
Anion gap: 8 (ref 5–15)
BUN: 20 mg/dL (ref 6–20)
BUN: 15 mg/dL (ref 6–20)
CHLORIDE: 112 mmol/L — AB (ref 101–111)
CO2: 22 mmol/L (ref 22–32)
CO2: 20 mmol/L — ABNORMAL LOW (ref 22–32)
Calcium: 8.1 mg/dL — ABNORMAL LOW (ref 8.9–10.3)
Calcium: 8 mg/dL — ABNORMAL LOW (ref 8.9–10.3)
Chloride: 115 mmol/L — ABNORMAL HIGH (ref 101–111)
Creatinine, Ser: 0.67 mg/dL (ref 0.44–1.00)
Creatinine, Ser: 0.72 mg/dL (ref 0.44–1.00)
GFR calc Af Amer: >60 mL/min (ref >60)
GFR calc non Af Amer: >60 mL/min (ref >60)
Glucose, Bld: 157 mg/dL — ABNORMAL HIGH (ref 65–99)
Glucose, Bld: 93 mg/dL (ref 65–99)
POTASSIUM: 2.9 mmol/L — AB (ref 3.5–5.1)
POTASSIUM: 3.1 mmol/L — AB (ref 3.5–5.1)
SODIUM: 143 mmol/L (ref 135–145)
SODIUM: 140 mmol/L (ref 135–145)

## 2015-08-13 LAB — MAGNESIUM: Magnesium: 1.7 mg/dL (ref 1.7–2.4)

## 2015-08-13 MED ORDER — POTASSIUM CHLORIDE CRYS ER 20 MEQ PO TBCR: 40.0000 meq | EXTENDED_RELEASE_TABLET | ORAL | Status: AC

## 2015-08-13 MED ORDER — POTASSIUM CHLORIDE 10 MEQ/100ML IV SOLN
10.0000 meq | INTRAVENOUS | Status: AC
  Administered 2015-08-13 – 2015-08-14 (×8): 10 meq via INTRAVENOUS
  Filled 2015-08-13 (×8): qty 100

## 2015-08-13 MED ORDER — LEVOFLOXACIN IN D5W 250 MG/50ML IV SOLN
250.0000 mg | INTRAVENOUS | Status: DC
  Administered 2015-08-13 – 2015-08-15 (×3): 250 mg via INTRAVENOUS
  Filled 2015-08-13 (×4): qty 50

## 2015-08-13 MED ORDER — KCL IN DEXTROSE-NACL 20-5-0.45 MEQ/L-%-% IV SOLN
INTRAVENOUS | Status: DC
  Administered 2015-08-13 – 2015-08-17 (×7): via INTRAVENOUS
  Filled 2015-08-13 (×12): qty 1000

## 2015-08-13 NOTE — Progress Notes (Signed)
Pharmacy Antibiotic Note  Rose Sullivan is a 82 y.o. female with hx of multiple strokes on home hospice PTA, presented to the ED 08/11/2015 secondary to poor oral intake, constipation and suspected UTI.  Ceftriaxone was started on admission for UTI.  UCX now back with >100K enterococcus.  To change abx to levaquin per MD's request.  - 7/13 CXR: no active dz - afeb, wbc 9 on 7/14, scr improved to 0.67 (crcl~47)   Plan: - levaquin 250 mg IV q24h - pharmacy will sign off for Levaquin.  Re-consult [place] if need further assistance. _____________________  Height: 5' (152.4 cm) Weight: 147 lb 4.8 oz (66.815 kg) IBW/kg (Calculated) : 45.5  Temp (24hrs), Avg:98.4 F (36.9 C), Min:98.1 F (36.7 C), Max:98.6 F (37 C)   Recent Labs Lab 08/11/15 1652 08/12/15 1302 08/13/15 0338  WBC 11.3* 9.0  --   CREATININE 1.37* 0.86 0.67    Estimated Creatinine Clearance: 47.8 mL/min (by C-G formula based on Cr of 0.67).    Allergies  Allergen Reactions  . Trazodone And Nefazodone Other (See Comments)    hallucinations    Antimicrobials this admission: 7/13 CTX>> 7/14 7/15 LVQ>>  Levels/dose changes this admission: n/a  Microbiology results: 7/13 ucx: > 100K enterococcus (S= LVQ, vanc; R= amp; I= nitro) Thank you for allowing pharmacy to be a part of this patient's care.  Lynelle Doctor 08/13/2015 11:16 AM

## 2015-08-13 NOTE — Progress Notes (Signed)
PT Cancellation Note  Patient Details Name: MALONI VANNOTE MRN: [id number] DOB: [birth year]   Cancelled Treatment:    Reason Eval/Treat Not Completed: Fatigue/lethargy limiting ability to participate Pt remains lethargic today.   [place] Morren,KATHrine E 08/13/2015, 1:48 PM Carmelia Bake, PT, DPT 08/13/2015 Pager: [phone]

## 2015-08-13 NOTE — Progress Notes (Signed)
PROGRESS NOTE    Rose Sullivan  [id number] DOB: 1933/08/08 DOA: 08/11/2015  PCP: [name], MD   Brief Narrative:  Rose Sullivan is a 82 y.o. female with medical history significant of 3 prior strokes, most recently had a fairly significant one just last month. She was ultimately sent home on hospice. History comes from daughter and chart as patient is unable to provide any.  Subjective: Non-verbal  Assessment & Plan:   Principal Problem: Dehydration/ hypernatermia - poor PO intake since 7/4 per daughter - hypernatremia and dehydration improving - cont IVF but change to D5 1/2 NS with 20 KCL    Active Problems:  Acute encephalopathy/   Failure to thrive in adult - on hospice at home but really started to decline about 10 days ago- albumin level is normal  - lethargic since admission - hydrate, treat UTI - dietician has discussed nutrition options to increase caloric intake - daughter states Feeding tube would be a last resort    UTI (lower urinary tract infection) - enterococcus on culture resistant to Amoxil- start Levaquin  Hypokalemia - replace via IV as she is still lethargic - Mg normal  Constipation - due to using Morphine without laxatives - disimpacted in ER - will need laxative when alert  Dysphagia - likely is aspiration- CXR does not show pneumonia - diet ordered by SLP- nectar thick liquids but too lethargic to eat     Pressure ulcer - WOC eval- will need air mattress at home  DVT prophylaxis:  SCD Code Status: DNR Family Communication: daughter Disposition Plan: home with hospice tomorrow  Consultants:    Procedures:    Antimicrobials:  Anti-infectives    Start     Dose/Rate Route Frequency Ordered Stop   08/13/15 1200  Levofloxacin (LEVAQUIN) IVPB 250 mg     250 mg 50 mL/hr over 60 Minutes Intravenous Every 24 hours 08/13/15 1125     08/12/15 1800  cefTRIAXone (ROCEPHIN) 1 g in dextrose 5 % 50 mL IVPB  Status:   Discontinued     1 g 100 mL/hr over 30 Minutes Intravenous Every 24 hours 08/11/15 2116 08/13/15 1047   08/11/15 1915  cefTRIAXone (ROCEPHIN) 1 g in dextrose 5 % 50 mL IVPB     1 g 100 mL/hr over 30 Minutes Intravenous  Once 08/11/15 1909 08/11/15 2002       Objective: Filed Vitals:   08/12/15 0611 08/12/15 1309 08/12/15 2105 08/13/15 0408  BP: 141/89 158/67 165/84 151/59  Pulse: 71 71 77 67  Temp: 97.8 F (36.6 C) 98.1 F (36.7 C) 98.5 F (36.9 C) 98.6 F (37 C)  TempSrc: Oral Oral Oral Oral  Resp: 17 17 16 16   Height:      Weight:      SpO2: 98% 98% 97% 98%    Intake/Output Summary (Last 24 hours) at 08/13/15 1409 Last data filed at 08/12/15 1715  Gross per 24 hour  Intake      0 ml  Output      0 ml  Net      0 ml   Filed Weights   08/11/15 2215  Weight: 66.815 kg (147 lb 4.8 oz)    Examination: General exam: Appears comfortable - asleep HEENT: PERRLA,not opening mouth Respiratory system: Clear to auscultation. Respiratory effort normal. Cardiovascular system: S1 & S2 heard, RRR.  No murmurs  Gastrointestinal system: Abdomen soft, non-tender, nondistended. Normal bowel sound. No organomegaly Central nervous system: Alert and oriented. No focal  neurological deficits. Extremities: No cyanosis, clubbing or edema Skin: No rashes or ulcers Psychiatry:  Mood & affect appropriate.     Data Reviewed: I have personally reviewed following labs and imaging studies  CBC:  Recent Labs Lab 08/11/15 1652 08/12/15 1302  WBC 11.3* 9.0  NEUTROABS 8.8*  --   HGB 13.3 11.5*  HCT 41.2 35.9*  MCV 97.4 98.6  PLT 271 [id number]   Basic Metabolic Panel:  Recent Labs Lab 08/11/15 1652 08/12/15 1302 08/13/15 0338  NA 147* 148* 143  K 4.2 3.5 2.9*  CL 110 119* 115*  CO2 22 20* 22  GLUCOSE 86 68 157*  BUN 40* 26* 20  CREATININE 1.37* 0.86 0.67  CALCIUM 9.2 8.0* 8.1*  MG  --   --  1.7   GFR: Estimated Creatinine Clearance: 47.8 mL/min (by C-G formula based on Cr of  0.67). Liver Function Tests:  Recent Labs Lab 08/11/15 1700  AST 36  ALT 23  ALKPHOS 65  BILITOT 1.8*  PROT 7.3  ALBUMIN 3.6   No results for input(s): LIPASE, AMYLASE in the last 168 hours. No results for input(s): AMMONIA in the last 168 hours. Coagulation Profile: No results for input(s): INR, PROTIME in the last 168 hours. Cardiac Enzymes: No results for input(s): CKTOTAL, CKMB, CKMBINDEX, TROPONINI in the last 168 hours. BNP (last 3 results) No results for input(s): PROBNP in the last 8760 hours. HbA1C: No results for input(s): HGBA1C in the last 72 hours. CBG: No results for input(s): GLUCAP in the last 168 hours. Lipid Profile: No results for input(s): CHOL, HDL, LDLCALC, TRIG, CHOLHDL, LDLDIRECT in the last 72 hours. Thyroid Function Tests: No results for input(s): TSH, T4TOTAL, FREET4, T3FREE, THYROIDAB in the last 72 hours. Anemia Panel: No results for input(s): VITAMINB12, FOLATE, FERRITIN, TIBC, IRON, RETICCTPCT in the last 72 hours. Urine analysis:    Component Value Date/Time   COLORURINE AMBER* 08/11/2015 1709   APPEARANCEUR TURBID* 08/11/2015 1709   LABSPEC 1.028 08/11/2015 1709   PHURINE 5.0 08/11/2015 1709   GLUCOSEU NEGATIVE 08/11/2015 1709   GLUCOSEU NEGATIVE 06/10/2015 1631   HGBUR NEGATIVE 08/11/2015 1709   BILIRUBINUR LARGE* 08/11/2015 1709   BILIRUBINUR negative 06/14/2015 1403   KETONESUR 15* 08/11/2015 1709   PROTEINUR 30* 08/11/2015 1709   PROTEINUR 2+ 06/14/2015 1403   UROBILINOGEN negative 06/14/2015 1403   UROBILINOGEN 1.0 06/10/2015 1631   NITRITE POSITIVE* 08/11/2015 1709   NITRITE negative 06/14/2015 1403   LEUKOCYTESUR SMALL* 08/11/2015 1709   Sepsis Labs: @LABRCNTIP (procalcitonin:4,lacticidven:4) ) Recent Results (from the past 240 hour(s))  Urine culture     Status: Abnormal   Collection Time: 08/11/15  5:09 PM  Result Value Ref Range Status   Specimen Description URINE, CATHETERIZED  Final   Special Requests Normal   Final   Culture >=100,000 COLONIES/mL ENTEROCOCCUS SPECIES (A)  Final   Report Status 08/13/2015 FINAL  Final   Organism ID, Bacteria ENTEROCOCCUS SPECIES (A)  Final      Susceptibility   Enterococcus species - MIC*    AMPICILLIN >=32 RESISTANT Resistant     LEVOFLOXACIN 2 SENSITIVE Sensitive     NITROFURANTOIN 64 INTERMEDIATE Intermediate     VANCOMYCIN <=0.5 SENSITIVE Sensitive     * >=100,000 COLONIES/mL ENTEROCOCCUS SPECIES         Radiology Studies: Dg Chest 2 View  08/11/2015  CLINICAL DATA:  82 year old female with history of stroke presenting with possible aspiration. EXAM: CHEST  2 VIEW COMPARISON:  Chest radiograph dated 07/16/2015 FINDINGS:  The two views of the chest demonstrate emphysematous changes of the lungs with bibasilar atelectatic changes. There is no focal consolidation, pleural effusion, or pneumothorax. The cardiac silhouette is within normal limits with there is osteopenia with degenerative changes of the spine. There is a 3.1 x 3.2 cm sclerotic lesion in the humeral neck which appears grossly similar or minimally increased from prior study. A 1.6 x 1.5 cm right humeral neck sclerotic lesion noted. IMPRESSION: No active cardiopulmonary disease. Electronically Signed   By: Anner Crete M.D.   On: 08/11/2015 22:12      Scheduled Meds: . collagenase   Topical Daily  . levofloxacin (LEVAQUIN) IV  250 mg Intravenous Q24H  . potassium chloride  10 mEq Intravenous Q1 Hr x 4  . senna-docusate  2 tablet Oral QHS   Continuous Infusions: . dextrose 5 % and 0.45 % NaCl with KCl 20 mEq/L 100 mL/hr at 08/13/15 1138     LOS: 1 day    Time spent in minutes: 78    [place], MD [facility] Pager: [email or web address] Password [facility] 08/13/2015, 2:08 PM

## 2015-08-13 NOTE — Progress Notes (Signed)
Speech Language Pathology Treatment: Dysphagia  Patient Details Name: Rose Sullivan MRN: [id number] DOB: Jan 25, 1934 Today's Date: 08/13/2015 Time: [id number] SLP Time Calculation (min) (ACUTE ONLY): 8 min  Assessment / Plan / Recommendation Clinical Impression  Pt not sufficiently alert for safe PO trials despite multimodal stimulation from SLP, repositioning, and oral stimulation via oral swab. Per RN, she has not been alert enough for PO intake today. Will leave current diet order in place for if pt becomes more aroused, but would continue holding POs pending improvements in alertness as discussed with RN.   HPI HPI: 82 y.o. female with medical history significant of 3 prior strokes, most recently had a fairly significant one just last month. She was ultimately sent home on hospice. History comes from daughter and chart as patient is unable to provide any.      SLP Plan  Continue with current plan of care     Recommendations  Diet recommendations: Dysphagia 1 (puree);Nectar-thick liquid Liquids provided via: Cup Medication Administration: Crushed with puree Supervision: Patient able to self feed;Full supervision/cueing for compensatory strategies Compensations: Slow rate;Small sips/bites;Minimize environmental distractions Postural Changes and/or Swallow Maneuvers: Seated upright 90 degrees;Upright 30-60 min after meal             Oral Care Recommendations: Oral care QID Follow up Recommendations:  (tba) Plan: Continue with current plan of care     GO               Germain Osgood, M.A. CCC-SLP [phone]  Germain Osgood 08/13/2015, 11:53 AM

## 2015-08-14 LAB — CBC
HCT: 35.7 % — ABNORMAL LOW (ref 36.0–46.0)
HEMOGLOBIN: 12.1 g/dL (ref 12.0–15.0)
MCH: 31.7 pg (ref 26.0–34.0)
MCHC: 33.9 g/dL (ref 30.0–36.0)
MCV: 93.5 fL (ref 78.0–100.0)
PLATELETS: 222 10*3/uL (ref 150–400)
RBC: 3.82 MIL/uL — AB (ref 3.87–5.11)
RDW: 13.8 % (ref 11.5–15.5)
WBC: 8.1 10*3/uL (ref 4.0–10.5)

## 2015-08-14 LAB — BASIC METABOLIC PANEL
Anion gap: 5 (ref 5–15)
BUN: 9 mg/dL (ref 6–20)
CALCIUM: 8.4 mg/dL — AB (ref 8.9–10.3)
CHLORIDE: 112 mmol/L — AB (ref 101–111)
CO2: 23 mmol/L (ref 22–32)
CREATININE: 0.71 mg/dL (ref 0.44–1.00)
GFR calc non Af Amer: >60 mL/min (ref >60)
GLUCOSE: 95 mg/dL (ref 65–99)
Potassium: 3.8 mmol/L (ref 3.5–5.1)
Sodium: 140 mmol/L (ref 135–145)

## 2015-08-14 LAB — MAGNESIUM: Magnesium: 1.6 mg/dL — ABNORMAL LOW (ref 1.7–2.4)

## 2015-08-14 MED ORDER — MAGNESIUM SULFATE 2 GM/50ML IV SOLN
2.0000 g | Freq: Once | INTRAVENOUS | Status: AC
  Administered 2015-08-14: 2 g via INTRAVENOUS
  Filled 2015-08-14: qty 50

## 2015-08-14 NOTE — Evaluation (Addendum)
Physical Therapy Evaluation Patient Details Name: Rose Sullivan MRN: [id number] DOB: Jan 08, 1934 Today's Date: 08/14/2015   History of Present Illness  82 yo female admitted with AKI, dehydration, hypernatremia. Hx of multiple CVAs, DM, HTN, A fib, breast cancer, anxiety, CKD, OA, chronic back pain.   Clinical Impression  On eval, pt required Mod assist +2 for rolling, Total assist +2 for supine<>sit, Min assist for static sitting balance at EOB. Pt sat for at least 5 minutes before becoming fatigued. AAROM supine exercises performed as well, within pt tolerance. Recommend HHPT follow up at discharge.     Follow Up Recommendations Home health PT;Supervision/Assistance - 24 hour    Equipment Recommendations  None recommended by PT    Recommendations for Other Services       Precautions / Restrictions Precautions Precautions: Fall Restrictions Weight Bearing Restrictions: No      Mobility  Bed Mobility Overal bed mobility: Needs Assistance Bed Mobility: Rolling;Supine to Sit;Sit to Supine Rolling: Mod assist;+2 for physical assistance;+2 for safety/equipment   Supine to sit: Total assist;+2 for physical assistance;+2 for safety/equipment;HOB elevated Sit to supine: Total assist;+2 for physical assistance;+2 for safety/equipment;HOB elevated   General bed mobility comments: Assist for trunk and bil LEs. Max multimodal cueing for participation. Pt did initiate tasks when cued (reached for bedrail, moved LEs a small amouont). Utilized bedpad for positioning, scooting.   Transfers                    Ambulation/Gait                Stairs            Wheelchair Mobility    Modified Rankin (Stroke Patients Only)       Balance Overall balance assessment: Needs assistance Sitting-balance support: Feet unsupported;Bilateral upper extremity supported;No upper extremity supported;Single extremity supported Sitting balance-Leahy Scale: Poor Sitting  balance - Comments: Poor-Fair. Worked on anterior reaching throughout very small range of motion. LOB to L side and posteiorly several times-poor balance reactions                                     Pertinent Vitals/Pain Pain Assessment: Faces Faces Pain Scale: Hurts even more Pain Location: R LE with ROM exercises/assessment Pain Descriptors / Indicators: Grimacing Pain Intervention(s): Limited activity within patient's tolerance    Home Living Family/patient expects to be discharged to:: Private residence Living Arrangements: Children Available Help at Discharge: Family;Available 24 hours/day Type of Home: House Home Access: Ramped entrance     Home Layout: One level Home Equipment: Wheelchair - [employer] - 2 wheels;Bedside commode;Grab bars - toilet;Transport chair (adjustable bed)      Prior Function Level of Independence: Needs assistance   Gait / Transfers Assistance Needed: has been in bedbound for last 10 days per daughter.   ADL's / Homemaking Assistance Needed: Assist with dressing/bathing from daughter        Hand Dominance        Extremity/Trunk Assessment   Upper Extremity Assessment: RUE deficits/detail;LUE deficits/detail RUE Deficits / Details: strength ~3-/5 throughout     LUE Deficits / Details: strength ~3-/5 throughout   Lower Extremity Assessment: RLE deficits/detail;LLE deficits/detail RLE Deficits / Details: hip flex 2/5, hip abd/add 2/5, DF/PF at least 1/5. Knee ext ~15-20 degrees from 0. Pain with hip/knee ROM. LLE Deficits / Details: hip flex 3-/5, hip abd/add 2/5, knee ext 2/5 DF/PF  at least 1/5. Knee ext ~15-20 degrees from 0.    Cervical / Trunk Assessment: Kyphotic  Communication   Communication: Expressive difficulties  Cognition Arousal/Alertness: Awake/alert Behavior During Therapy: WFL for tasks assessed/performed Overall Cognitive Status: Difficult to assess                      General Comments       Exercises General Exercises - Lower Extremity Heel Slides: AAROM;Both;5 reps;Supine (10 reps on R side) Hip ABduction/ADduction: AAROM;Both;5 reps (10reps on R side) Straight Leg Raises: AAROM;Both;5 reps (10 reps on R side)      Assessment/Plan    PT Assessment Patient needs continued PT services  PT Diagnosis Generalized weakness;Difficulty walking (R sided weakness from previous CVA)   PT Problem List Decreased strength;Decreased range of motion;Decreased activity tolerance;Decreased balance;Decreased mobility;Decreased coordination;Pain;Decreased knowledge of use of DME;Decreased skin integrity  PT Treatment Interventions Functional mobility training;Therapeutic activities;Patient/family education;Balance training;Therapeutic exercise   PT Goals (Current goals can be found in the Care Plan section) Acute Rehab PT Goals Patient Stated Goal: daughter would like pt to be able to stand and transfer again PT Goal Formulation: With family Time For Goal Achievement: 08/28/15 Potential to Achieve Goals: Poor    Frequency Min 2X/week   Barriers to discharge        Co-evaluation               End of Session   Activity Tolerance: Patient limited by pain;Patient limited by fatigue Patient left: in bed;with call bell/phone within reach;with bed alarm set;with family/visitor present           Time: [id number] PT Time Calculation (min) (ACUTE ONLY): 48 min   Charges:   PT Evaluation $PT Eval Moderate Complexity: 1 Procedure PT Treatments $Therapeutic Exercise: 8-22 mins $Therapeutic Activity: 8-22 mins   PT G Codes:        Weston Anna, MPT Pager: [phone]

## 2015-08-14 NOTE — Progress Notes (Signed)
PROGRESS NOTE    Rose Sullivan  [id number] DOB: 1933/02/24 DOA: 08/11/2015  PCP: [name], MD   Brief Narrative:  Rose Sullivan is a 82 y.o. female with medical history significant of 3 prior strokes, most recently had a fairly significant one just last month. She was ultimately sent home on hospice. History comes from daughter and chart as patient is unable to provide any.  Subjective: Minimally verbal- she is unable to verbalize complaints to me.   Assessment & Plan:   Principal Problem: Dehydration/ hypernatermia - poor PO intake since 7/4 per daughter - hypernatremia and dehydration improving - cont IVF but changed to D5 1/2 NS with 20 KCL  - electrolytes have stabilized   Active Problems:  Acute encephalopathy/   Failure to thrive in adult - on hospice at home but really started to decline about 10 days ago- albumin level is normal  - lethargic since admission- today is the first day she is alert and communicative - likley as I began Levaquin yesterday based upon sensitivities (UTI) - dietician has discussed nutrition options to increase caloric intake - daughter states Feeding tube would be a last resort    UTI (lower urinary tract infection) - enterococcus on culture resistant to Amoxil- started Levaquin on 7/15  Hypokalemia - replace via IV as she is still lethargic - Mg normal  Constipation - due to using Morphine without laxatives - disimpacted in ER - will need laxatives  Dysphagia - likely is aspiration- CXR does not show pneumonia - diet ordered by SLP- nectar thick liquids     Pressure ulcer - WOC eval- will need air mattress at home  DVT prophylaxis:  SCD Code Status: DNR Family Communication: daughter Disposition Plan: home with hospice tomorrow  Consultants:    Procedures:    Antimicrobials:  Anti-infectives    Start     Dose/Rate Route Frequency Ordered Stop   08/13/15 1200  Levofloxacin (LEVAQUIN) IVPB 250 mg     250  mg 50 mL/hr over 60 Minutes Intravenous Every 24 hours 08/13/15 1125     08/12/15 1800  cefTRIAXone (ROCEPHIN) 1 g in dextrose 5 % 50 mL IVPB  Status:  Discontinued     1 g 100 mL/hr over 30 Minutes Intravenous Every 24 hours 08/11/15 2116 08/13/15 1047   08/11/15 1915  cefTRIAXone (ROCEPHIN) 1 g in dextrose 5 % 50 mL IVPB     1 g 100 mL/hr over 30 Minutes Intravenous  Once 08/11/15 1909 08/11/15 2002       Objective: Filed Vitals:   08/13/15 0408 08/13/15 1420 08/13/15 2110 08/14/15 0500  BP: 151/59 167/90 159/77 186/87  Pulse: 67 73 66 72  Temp: 98.6 F (37 C) 97.8 F (36.6 C) 98.2 F (36.8 C) 98.7 F (37.1 C)  TempSrc: Oral Oral Axillary Oral  Resp: 16 14 14 20   Height:      Weight:      SpO2: 98% 98% 98% 99%   No intake or output data in the 24 hours ending 08/14/15 1442 Filed Weights   08/11/15 2215  Weight: 66.815 kg (147 lb 4.8 oz)    Examination: General exam: Appears comfortable - alert- talking, not following commands HEENT: PERRLA,not opening mouth Respiratory system: Clear to auscultation. Respiratory effort normal. Cardiovascular system: S1 & S2 heard, RRR.  No murmurs  Gastrointestinal system: Abdomen soft, non-tender, nondistended. Normal bowel sound. No organomegaly Central nervous system: Alert  No focal neurological deficits. Extremities: No cyanosis, clubbing or edema  Skin: No rashes or ulcers Psychiatry:  Mood & affect appropriate.     Data Reviewed: I have personally reviewed following labs and imaging studies  CBC:  Recent Labs Lab 08/11/15 1652 08/12/15 1302 08/14/15 0400  WBC 11.3* 9.0 8.1  NEUTROABS 8.8*  --   --   HGB 13.3 11.5* 12.1  HCT 41.2 35.9* 35.7*  MCV 97.4 98.6 93.5  PLT 271 223 [id number]   Basic Metabolic Panel:  Recent Labs Lab 08/11/15 1652 08/12/15 1302 08/13/15 0338 08/13/15 1517 08/14/15 0400  NA 147* 148* 143 140 140  K 4.2 3.5 2.9* 3.1* 3.8  CL 110 119* 115* 112* 112*  CO2 22 20* 22 20* 23  GLUCOSE 86 68  157* 93 95  BUN 40* 26* 20 15 9   CREATININE 1.37* 0.86 0.67 0.72 0.71  CALCIUM 9.2 8.0* 8.1* 8.0* 8.4*  MG  --   --  1.7  --  1.6*   GFR: Estimated Creatinine Clearance: 47.8 mL/min (by C-G formula based on Cr of 0.71). Liver Function Tests:  Recent Labs Lab 08/11/15 1700  AST 36  ALT 23  ALKPHOS 65  BILITOT 1.8*  PROT 7.3  ALBUMIN 3.6   No results for input(s): LIPASE, AMYLASE in the last 168 hours. No results for input(s): AMMONIA in the last 168 hours. Coagulation Profile: No results for input(s): INR, PROTIME in the last 168 hours. Cardiac Enzymes: No results for input(s): CKTOTAL, CKMB, CKMBINDEX, TROPONINI in the last 168 hours. BNP (last 3 results) No results for input(s): PROBNP in the last 8760 hours. HbA1C: No results for input(s): HGBA1C in the last 72 hours. CBG: No results for input(s): GLUCAP in the last 168 hours. Lipid Profile: No results for input(s): CHOL, HDL, LDLCALC, TRIG, CHOLHDL, LDLDIRECT in the last 72 hours. Thyroid Function Tests: No results for input(s): TSH, T4TOTAL, FREET4, T3FREE, THYROIDAB in the last 72 hours. Anemia Panel: No results for input(s): VITAMINB12, FOLATE, FERRITIN, TIBC, IRON, RETICCTPCT in the last 72 hours. Urine analysis:    Component Value Date/Time   COLORURINE AMBER* 08/11/2015 1709   APPEARANCEUR TURBID* 08/11/2015 1709   LABSPEC 1.028 08/11/2015 1709   PHURINE 5.0 08/11/2015 1709   GLUCOSEU NEGATIVE 08/11/2015 1709   GLUCOSEU NEGATIVE 06/10/2015 1631   HGBUR NEGATIVE 08/11/2015 1709   BILIRUBINUR LARGE* 08/11/2015 1709   BILIRUBINUR negative 06/14/2015 1403   KETONESUR 15* 08/11/2015 1709   PROTEINUR 30* 08/11/2015 1709   PROTEINUR 2+ 06/14/2015 1403   UROBILINOGEN negative 06/14/2015 1403   UROBILINOGEN 1.0 06/10/2015 1631   NITRITE POSITIVE* 08/11/2015 1709   NITRITE negative 06/14/2015 1403   LEUKOCYTESUR SMALL* 08/11/2015 1709   Sepsis Labs: @LABRCNTIP (procalcitonin:4,lacticidven:4) ) Recent  Results (from the past 240 hour(s))  Urine culture     Status: Abnormal   Collection Time: 08/11/15  5:09 PM  Result Value Ref Range Status   Specimen Description URINE, CATHETERIZED  Final   Special Requests Normal  Final   Culture >=100,000 COLONIES/mL ENTEROCOCCUS SPECIES (A)  Final   Report Status 08/13/2015 FINAL  Final   Organism ID, Bacteria ENTEROCOCCUS SPECIES (A)  Final      Susceptibility   Enterococcus species - MIC*    AMPICILLIN >=32 RESISTANT Resistant     LEVOFLOXACIN 2 SENSITIVE Sensitive     NITROFURANTOIN 64 INTERMEDIATE Intermediate     VANCOMYCIN <=0.5 SENSITIVE Sensitive     * >=100,000 COLONIES/mL ENTEROCOCCUS SPECIES         Radiology Studies: No results found.  Scheduled Meds: . collagenase   Topical Daily  . levofloxacin (LEVAQUIN) IV  250 mg Intravenous Q24H  . senna-docusate  2 tablet Oral QHS   Continuous Infusions: . dextrose 5 % and 0.45 % NaCl with KCl 20 mEq/L 100 mL/hr at 08/14/15 0859     LOS: 2 days    Time spent in minutes: 80    [place], MD [facility] Pager: [email or web address] Password [email or web address] 08/14/2015, 2:42 PM

## 2015-08-15 ENCOUNTER — Inpatient Hospital Stay (HOSPITAL_COMMUNITY): Payer: Medicare Other

## 2015-08-15 DIAGNOSIS — R112 Nausea with vomiting, unspecified: Secondary | ICD-10-CM

## 2015-08-15 LAB — BASIC METABOLIC PANEL
ANION GAP: 9 (ref 5–15)
BUN: 5 mg/dL — ABNORMAL LOW (ref 6–20)
CALCIUM: 8.4 mg/dL — AB (ref 8.9–10.3)
CHLORIDE: 109 mmol/L (ref 101–111)
CO2: 21 mmol/L — AB (ref 22–32)
Creatinine, Ser: 0.61 mg/dL (ref 0.44–1.00)
GFR calc non Af Amer: >60 mL/min (ref >60)
Glucose, Bld: 119 mg/dL — ABNORMAL HIGH (ref 65–99)
POTASSIUM: 3.9 mmol/L (ref 3.5–5.1)
Sodium: 139 mmol/L (ref 135–145)

## 2015-08-15 LAB — HEPATIC FUNCTION PANEL
ALBUMIN: 2.9 g/dL — AB (ref 3.5–5.0)
ALT: 22 U/L (ref 14–54)
AST: 42 U/L — AB (ref 15–41)
Alkaline Phosphatase: 57 U/L (ref 38–126)
BILIRUBIN DIRECT: 0.2 mg/dL (ref 0.1–0.5)
Indirect Bilirubin: 0.6 mg/dL (ref 0.3–0.9)
TOTAL PROTEIN: 6.2 g/dL — AB (ref 6.5–8.1)
Total Bilirubin: 0.8 mg/dL (ref 0.3–1.2)

## 2015-08-15 LAB — LIPASE, BLOOD: Lipase: 20 U/L (ref 11–51)

## 2015-08-15 LAB — CBC
HCT: 40.4 % (ref 36.0–46.0)
HEMOGLOBIN: 13.5 g/dL (ref 12.0–15.0)
MCH: 31.2 pg (ref 26.0–34.0)
MCHC: 33.4 g/dL (ref 30.0–36.0)
MCV: 93.3 fL (ref 78.0–100.0)
PLATELETS: 209 10*3/uL (ref 150–400)
RBC: 4.33 MIL/uL (ref 3.87–5.11)
RDW: 13.8 % (ref 11.5–15.5)
WBC: 7.3 10*3/uL (ref 4.0–10.5)

## 2015-08-15 MED ORDER — ONDANSETRON HCL 4 MG/2ML IJ SOLN
4.0000 mg | Freq: Four times a day (QID) | INTRAMUSCULAR | Status: DC | PRN
  Administered 2015-08-15: 4 mg via INTRAVENOUS
  Filled 2015-08-15: qty 2

## 2015-08-15 NOTE — Care Management Important Message (Signed)
Important Message  Patient Details  Name: Rose Sullivan MRN: [id number] Date of Birth: [birth year]   Medicare Important Message Given:  Yes    Camillo Flaming 08/15/2015, 9:06 AMImportant Message  Patient Details  Name: Rose Sullivan MRN: [id number] Date of Birth: 28-Jun-1933   Medicare Important Message Given:  Yes    Camillo Flaming 08/15/2015, 9:06 AM

## 2015-08-15 NOTE — Progress Notes (Signed)
Physical Therapy Treatment Patient Details Name: Rose Sullivan MRN: [id number] DOB: 1933-11-02 Today's Date: 08/15/2015    History of Present Illness 82 yo female admitted with AKI, dehydration, hypernatremia. Hx of multiple CVAs, DM, HTN, A fib, breast cancer, anxiety, CKD, OA, chronic back pain.     PT Comments    Pt more alert today.  Daughter in room.  Assisted from supine to EOB demonstrated increased alertness and conversation.  Physically, pt still required + 2 Total Assist.  Static sitting EOB x 7 min at [employer] Assist.  Attempted stand pivot sit however pt was unable to support self so performed "Bear Hug" 1/4 partial pivot.   Positioned in recliner with several pillows for comfort and pressure relief.  Daughter asked if she could hold and transfer pt same fashion.  I advised "no". At pt's current level of mobility and inability to be strong enough to support  her own body weight,  It would be best to use a lift.    Follow Up Recommendations  Home health PT;Other (comment) (Hospice)  Lift ?   Equipment Recommendations       Recommendations for Other Services       Precautions / Restrictions Precautions Precautions: Fall Restrictions Weight Bearing Restrictions: No    Mobility  Bed Mobility Overal bed mobility: Needs Assistance Bed Mobility: Supine to Sit     Supine to sit: Total assist;+2 for physical assistance;+2 for safety/equipment;HOB elevated (pt 3%(attempted initiation))     General bed mobility comments: pt required repeat functional VC's and increased timne to attempt initation however pt onlt demntrated 3 % self assist requiring Total Assist + 2 to transfer from supine to EOB.  Once upright and centered, pt was able to static sit EOB x 7 min.  Pt was unable to perform any dynamic sitting activities and demonstarted a HIGH FALL RISK if challenged.    Transfers   Equipment used: 2 person hand held assist             General transfer comment: "Bear  Hug" 1/4 partial pivot from bed to recliner required Total Assist + 2 pt 0%.  Instructed family "not" to attempt at home that a lift would be needed at current level.  Hoyer pad placed in recliner for nursing staff to use to return pt back to bed.   Ambulation/Gait                 Stairs            Wheelchair Mobility    Modified Rankin (Stroke Patients Only)       Balance                                    Cognition Arousal/Alertness: Awake/alert   Overall Cognitive Status: Difficult to assess                      Exercises      General Comments        Pertinent Vitals/Pain Pain Assessment: Faces Pain Location: back Pain Descriptors / Indicators: Grimacing Pain Intervention(s): Monitored during session;Repositioned    Home Living                      Prior Function            PT Goals (current goals can now be found in the care plan  section) Progress towards PT goals: Progressing toward goals    Frequency  Min 2X/week    PT Plan      Co-evaluation             End of Session   Activity Tolerance: Patient limited by pain;Patient limited by fatigue Patient left: in chair;with call bell/phone within reach;with family/visitor present     Time: [id number] PT Time Calculation (min) (ACUTE ONLY): 35 min  Charges:  $Therapeutic Activity: 23-37 mins                    G Codes:      Rica Koyanagi  PTA [facility]  Acute  Rehab Pager      [phone]

## 2015-08-15 NOTE — Care Management Note (Signed)
Case Management Note  Patient Details  Name: Rose Sullivan MRN: [id number] Date of Birth: Jul 27, 1933  Subjective/Objective:   82 yo admitted with AKI                 Action/Plan: Pt from home with daughter. Pt active with [facility] prior to admit and per daughter they would like to continue [facility] services with [facility] at discharge. [facility] rep following along with pt progression. Daughter is also requesting an overlay mattress and hospital bed for home. MD paged for orders and [facility] DME rep made aware. Pt will need ambulance transport home per daughter. No other DC needs communicated at this time. CM will continue to follow.  Expected Discharge Date:   (unknown)               Expected Discharge Plan:  [place]  In-House Referral:     Discharge planning Services  CM Consult  Post Acute Care Choice:  Resumption of Svcs/PTA Provider Choice offered to:  Adult Children  DME Arranged:  Hospital bed, Specialty mattress DME Agency:  [place] Arranged:  RN, PT, OT, Speech Therapy [place] Agency:  [place]  Status of Service:  In process, will continue to follow  If discussed at Long Length of Stay Meetings, dates discussed:    Additional Comments:  Lynnell Catalan, RN 08/15/2015, 9:39 AM

## 2015-08-15 NOTE — Progress Notes (Signed)
PROGRESS NOTE    Rose Sullivan  [id number] DOB: March 18, 1933 DOA: 08/11/2015  PCP: [name], MD   Brief Narrative:  Rose Sullivan is a 82 y.o. female with medical history significant of 3 prior strokes, most recently had a fairly significant one just last month. She was ultimately sent home on hospice. History comes from daughter and chart as patient is unable to provide any.  Subjective: Per daughter, she has vomited last night and again this AM about 30-45 min after her meals. The patient has no complaints.   Assessment & Plan:   Principal Problem: Dehydration/ hypernatermia - poor PO intake since 7/4 per daughter - hypernatremia and dehydration improving - cont IVF but changed to D5 1/2 NS with 20 KCL  - electrolytes have stabilized   Active Problems:  Acute encephalopathy/   Failure to thrive in adult - on hospice at home but really started to decline about 10 days ago- albumin level is normal  - lethargic on admission- awoke on 7/16 and now able to eat and follow commands - dietician has discussed nutrition options to increase caloric intake at home - daughter states Feeding tube would be a last resort    UTI (lower urinary tract infection) - enterococcus on culture resistant to Amoxil- started Levaquin on 7/15-stopped today after 3 doses  Vomiting - PRN Zofran - abd xray unrevealing - check LFTs and Lipase  Hypokalemia - replaced  - Mg normal  Constipation - due to using Morphine without laxatives - disimpacted in ER - will need laxatives as long as she is on Morphine  Dysphagia - likely is aspiration- CXR does not show pneumonia - diet ordered by SLP- nectar thick liquids - repeat SLP eval today     Pressure ulcer - WOC eval- will need air mattress at home  DVT prophylaxis:  SCD Code Status: DNR Family Communication: daughter Disposition Plan: home with [facility] when vomiting resolves Consultants:    Procedures:    Antimicrobials:    Anti-infectives    Start     Dose/Rate Route Frequency Ordered Stop   08/13/15 1200  Levofloxacin (LEVAQUIN) IVPB 250 mg  Status:  Discontinued     250 mg 50 mL/hr over 60 Minutes Intravenous Every 24 hours 08/13/15 1125 08/15/15 1319   08/12/15 1800  cefTRIAXone (ROCEPHIN) 1 g in dextrose 5 % 50 mL IVPB  Status:  Discontinued     1 g 100 mL/hr over 30 Minutes Intravenous Every 24 hours 08/11/15 2116 08/13/15 1047   08/11/15 1915  cefTRIAXone (ROCEPHIN) 1 g in dextrose 5 % 50 mL IVPB     1 g 100 mL/hr over 30 Minutes Intravenous  Once 08/11/15 1909 08/11/15 2002       Objective: Filed Vitals:   08/14/15 1417 08/14/15 2133 08/15/15 0526 08/15/15 1250  BP: 169/87 155/71 118/87 152/85  Pulse: 61 72 72 81  Temp: 98.3 F (36.8 C) 97.9 F (36.6 C) 97.7 F (36.5 C) 98.5 F (36.9 C)  TempSrc: Axillary Axillary Axillary Oral  Resp: 16 16 18 15   Height:      Weight:      SpO2: 99% 97% 100% 100%    Intake/Output Summary (Last 24 hours) at 08/15/15 1319 Last data filed at 08/15/15 0900  Gross per 24 hour  Intake 1651.8 ml  Output      0 ml  Net 1651.8 ml   Filed Weights   08/11/15 2215  Weight: 66.815 kg (147 lb 4.8 oz)  Examination: General exam: Appears comfortable - alert- talking, not following commands HEENT: PERRLA,not opening mouth Respiratory system: Clear to auscultation. Respiratory effort normal. Cardiovascular system: S1 & S2 heard, RRR.  No murmurs  Gastrointestinal system: Abdomen soft, non-tender, nondistended. Normal bowel sound. No organomegaly Central nervous system: Alert  No focal neurological deficits. Extremities: No cyanosis, clubbing or edema Skin: No rashes or ulcers Psychiatry:  Mood & affect appropriate.     Data Reviewed: I have personally reviewed following labs and imaging studies  CBC:  Recent Labs Lab 08/11/15 1652 08/12/15 1302 08/14/15 0400 08/15/15 0403  WBC 11.3* 9.0 8.1 7.3  NEUTROABS 8.8*  --   --   --   HGB 13.3 11.5*  12.1 13.5  HCT 41.2 35.9* 35.7* 40.4  MCV 97.4 98.6 93.5 93.3  PLT 271 223 222 [id number]   Basic Metabolic Panel:  Recent Labs Lab 08/12/15 1302 08/13/15 0338 08/13/15 1517 08/14/15 0400 08/15/15 0403  NA 148* 143 140 140 139  K 3.5 2.9* 3.1* 3.8 3.9  CL 119* 115* 112* 112* 109  CO2 20* 22 20* 23 21*  GLUCOSE 68 157* 93 95 119*  BUN 26* 20 15 9  5*  CREATININE 0.86 0.67 0.72 0.71 0.61  CALCIUM 8.0* 8.1* 8.0* 8.4* 8.4*  MG  --  1.7  --  1.6*  --    GFR: Estimated Creatinine Clearance: 47.8 mL/min (by C-G formula based on Cr of 0.61). Liver Function Tests:  Recent Labs Lab 08/11/15 1700  AST 36  ALT 23  ALKPHOS 65  BILITOT 1.8*  PROT 7.3  ALBUMIN 3.6   No results for input(s): LIPASE, AMYLASE in the last 168 hours. No results for input(s): AMMONIA in the last 168 hours. Coagulation Profile: No results for input(s): INR, PROTIME in the last 168 hours. Cardiac Enzymes: No results for input(s): CKTOTAL, CKMB, CKMBINDEX, TROPONINI in the last 168 hours. BNP (last 3 results) No results for input(s): PROBNP in the last 8760 hours. HbA1C: No results for input(s): HGBA1C in the last 72 hours. CBG: No results for input(s): GLUCAP in the last 168 hours. Lipid Profile: No results for input(s): CHOL, HDL, LDLCALC, TRIG, CHOLHDL, LDLDIRECT in the last 72 hours. Thyroid Function Tests: No results for input(s): TSH, T4TOTAL, FREET4, T3FREE, THYROIDAB in the last 72 hours. Anemia Panel: No results for input(s): VITAMINB12, FOLATE, FERRITIN, TIBC, IRON, RETICCTPCT in the last 72 hours. Urine analysis:    Component Value Date/Time   COLORURINE AMBER* 08/11/2015 1709   APPEARANCEUR TURBID* 08/11/2015 1709   LABSPEC 1.028 08/11/2015 1709   PHURINE 5.0 08/11/2015 1709   GLUCOSEU NEGATIVE 08/11/2015 1709   GLUCOSEU NEGATIVE 06/10/2015 1631   HGBUR NEGATIVE 08/11/2015 1709   BILIRUBINUR LARGE* 08/11/2015 1709   BILIRUBINUR negative 06/14/2015 1403   KETONESUR 15* 08/11/2015 1709     PROTEINUR 30* 08/11/2015 1709   PROTEINUR 2+ 06/14/2015 1403   UROBILINOGEN negative 06/14/2015 1403   UROBILINOGEN 1.0 06/10/2015 1631   NITRITE POSITIVE* 08/11/2015 1709   NITRITE negative 06/14/2015 1403   LEUKOCYTESUR SMALL* 08/11/2015 1709   Sepsis Labs: @LABRCNTIP (procalcitonin:4,lacticidven:4) ) Recent Results (from the past 240 hour(s))  Urine culture     Status: Abnormal   Collection Time: 08/11/15  5:09 PM  Result Value Ref Range Status   Specimen Description URINE, CATHETERIZED  Final   Special Requests Normal  Final   Culture >=100,000 COLONIES/mL ENTEROCOCCUS SPECIES (A)  Final   Report Status 08/13/2015 FINAL  Final   Organism ID, Bacteria ENTEROCOCCUS SPECIES (  A)  Final      Susceptibility   Enterococcus species - MIC*    AMPICILLIN >=32 RESISTANT Resistant     LEVOFLOXACIN 2 SENSITIVE Sensitive     NITROFURANTOIN 64 INTERMEDIATE Intermediate     VANCOMYCIN <=0.5 SENSITIVE Sensitive     * >=100,000 COLONIES/mL ENTEROCOCCUS SPECIES         Radiology Studies: Dg Abd Portable 1v  08/15/2015  CLINICAL DATA:  Mid and lower abdominal pain with constipation and vomiting. EXAM: PORTABLE ABDOMEN - 1 VIEW COMPARISON:  12/18/2012 FINDINGS: One scattered stool in the colon and down into the rectum. No findings for small bowel obstruction or free air. The soft tissue shadows are maintained. Rounded calcifications in the right buttock area are probably injection granulomas. There are stable SI joint and right hip joint degenerative changes. No acute bony findings. The visualized lung bases are clear. IMPRESSION: No plain film findings for an acute abdominal process. Electronically Signed   By: Marijo Sanes M.D.   On: 08/15/2015 12:50      Scheduled Meds: . collagenase   Topical Daily  . senna-docusate  2 tablet Oral QHS   Continuous Infusions: . dextrose 5 % and 0.45 % NaCl with KCl 20 mEq/L 100 mL/hr at 08/15/15 0714     LOS: 3 days    Time spent in minutes:  69    [place], MD [facility] Pager: [email or web address] Password [email or web address] 08/15/2015, 1:19 PM

## 2015-08-16 LAB — BASIC METABOLIC PANEL
Anion gap: 7 (ref 5–15)
CHLORIDE: 110 mmol/L (ref 101–111)
CO2: 23 mmol/L (ref 22–32)
Calcium: 8.1 mg/dL — ABNORMAL LOW (ref 8.9–10.3)
Creatinine, Ser: 0.76 mg/dL (ref 0.44–1.00)
GFR calc Af Amer: >60 mL/min (ref >60)
GFR calc non Af Amer: >60 mL/min (ref >60)
GLUCOSE: 101 mg/dL — AB (ref 65–99)
POTASSIUM: 3.8 mmol/L (ref 3.5–5.1)
Sodium: 140 mmol/L (ref 135–145)

## 2015-08-16 LAB — MAGNESIUM: Magnesium: 1.8 mg/dL (ref 1.7–2.4)

## 2015-08-16 MED ORDER — LEVOFLOXACIN IN D5W 250 MG/50ML IV SOLN
250.0000 mg | INTRAVENOUS | Status: DC
  Administered 2015-08-16: 250 mg via INTRAVENOUS
  Filled 2015-08-16 (×2): qty 50

## 2015-08-16 NOTE — Progress Notes (Signed)
Speech Language Pathology Treatment: Dysphagia  Patient Details Name: Rose Sullivan MRN: [id number] DOB: [birth year] Today's Date: 08/16/2015 Time: [id number] SLP Time Calculation (min) (ACUTE ONLY): 29 min  Assessment / Plan / Recommendation Clinical Impression  Pt lethargic and did not awaken adequately for po trials with SLP tactile stimulation *cold hands.  She did wince with cold hand stimulation but did not open eyes or communicate.    Daughter Rose Sullivan present and reported regret at pt not being seen yesterday for re-evaluation. Advised Rose Sullivan that pt's mentation would be the same today and impact on swallow ability is significant.  Close monitoring of po tolerance with modification of amount and consistency indicated. She reported understanding at that time of information relayed to her - most notably that pt likely having some level of chronic aspiration and SLP role being to mitigate risk - not prevent it.    Daughter reports pt does NOT have Parkinson's disease.  Pt observed with congested breathing - likely aspiration of secretions *daughter reports she just moistened her mouth with toothette.     Recommend pt remain upright and only provide intake (dys1/nectar) when/if pt is adequately alert and tolerating. Will follow up for determination for readiness for advancement and family education.            HPI HPI: 82 y.o. female with medical history significant of 3 prior strokes, most recently had a fairly significant one just last month. She was ultimately sent home on hospice. pt underwent BSE during this hospital stay and was placed on dys1/nectar diet.  Pt was alert yesterday but today is lethargic again.  Daughter states goal is for pt to improve - receive palliative care for pain management and PT/SLP for rehab with [facility].  Daughter admits pt was on minced  - nearly puree/thin prior to admission with coughing during approximately 25-33% of swallows.        SLP Plan  Continue with  current plan of care     Recommendations  Diet recommendations: Dysphagia 1 (puree);Nectar-thick liquid Liquids provided via: No straw Medication Administration: Crushed with puree Supervision: Full supervision/cueing for compensatory strategies Compensations: Slow rate;Small sips/bites;Other (Comment) (consider oral suction after meals, stop intake if pt coughing excessively) Postural Changes and/or Swallow Maneuvers: Seated upright 90 degrees;Upright 30-60 min after meal             Oral Care Recommendations: Oral care before and after PO Follow up Recommendations: Home health SLP Plan: Continue with current plan of care     [place], [place] [facility] SLP [phone]

## 2015-08-16 NOTE — Progress Notes (Signed)
Unable to wake patient up to eat any lunch this afternoon. We will continue to try to wake her so she cand eat and be discharged. MD paged.

## 2015-08-16 NOTE — Progress Notes (Signed)
PROGRESS NOTE    Rose Sullivan  [id number] DOB: 1933-10-15 DOA: 08/11/2015  PCP: [name], MD   Brief Narrative:  Rose Sullivan is a 82 y.o. female with medical history significant of 3 prior strokes, most recently had a fairly significant one just last month. She was ultimately sent home on hospice. History comes from daughter and chart as patient is unable to provide any history. She stopped eating about 2 wks ago and has become progressively lethargic. She has developed a pressure ulcer and is severely constipated. Disimpacted in ER. Found to have a UTI and admitted. Improved with appropriate antibiotics and was alert and working with PT. Began to vomit after meals and then fell asleep last night and is difficult to arouse now.  Of note daughter states mom was doing well, eating and drinking and so she revoked hospice about 1-2 wks prior to admission.   Subjective: Has been sleeping since dinner yesterday- has not eaten anything since breakfast yesterday AM. RN and family unable to wake her.   Assessment & Plan:   Principal Problem: Dehydration/ hypernatermia - poor PO intake since 7/4 per daughter - hypernatremia and dehydration improving - cont IVF but changed to D5 1/2 NS with 20 KCL  - electrolytes have stabilized   Active Problems:  Acute encephalopathy/   Failure to thrive in adult - on hospice at home but really started to decline about 10 days ago- albumin level is normal which means she must have been eating well - lethargic on admission- - UTI being treated with Levaquin based upon sensitivities - subsequently awoke on 7/16 and then able to eat and follow commands but now lethargic again - may need w/u for CVA vs Hospice if she does not wake up in the next 24 hrs - daughter did ask about a  Feeding tube a few days ago and told me that it would be a last resort    UTI (lower urinary tract infection) - enterococcus on culture resistant to Amoxil -  Levaquin  7/15- 7/17 - stopped today after 3 doses as her UTI is not a complicated UTI - however, as she is lethargic again, will resume Levaquin in case her UTI is not completely treated  Dysphagia - likely is aspiration- CXR does not show pneumonia - diet ordered by SLP- nectar thick liquids which she was able to swallow until she became lethargic   Vomiting - PRN Zofran - abd xray unrevealing - checked LFTs and Lipase- WNL- currently lethargic  Hypokalemia - replaced  - Mg normal  Constipation - due to using Morphine without laxatives - disimpacted in ER - will need laxatives as long as she is on Morphine     Pressure ulcer - WOC eval- will need air mattress at home  DVT prophylaxis:  SCD Code Status: DNR Family Communication: daughter Disposition Plan: home with [facility] when vomiting resolves Consultants:    Procedures:    Antimicrobials:  Anti-infectives    Start     Dose/Rate Route Frequency Ordered Stop   08/13/15 1200  Levofloxacin (LEVAQUIN) IVPB 250 mg  Status:  Discontinued     250 mg 50 mL/hr over 60 Minutes Intravenous Every 24 hours 08/13/15 1125 08/15/15 1319   08/12/15 1800  cefTRIAXone (ROCEPHIN) 1 g in dextrose 5 % 50 mL IVPB  Status:  Discontinued     1 g 100 mL/hr over 30 Minutes Intravenous Every 24 hours 08/11/15 2116 08/13/15 1047   08/11/15 1915  cefTRIAXone (ROCEPHIN)  1 g in dextrose 5 % 50 mL IVPB     1 g 100 mL/hr over 30 Minutes Intravenous  Once 08/11/15 1909 08/11/15 2002       Objective: Filed Vitals:   08/15/15 1250 08/15/15 2155 08/16/15 0508 08/16/15 1249  BP: 152/85 154/89 158/98 142/87  Pulse: 81 73 74 66  Temp: 98.5 F (36.9 C) 99.3 F (37.4 C) 98.1 F (36.7 C) 98.6 F (37 C)  TempSrc: Oral Axillary Oral Oral  Resp: 15 16 16 18   Height:      Weight:      SpO2: 100% 99% 100% 97%    Intake/Output Summary (Last 24 hours) at 08/16/15 1616 Last data filed at 08/16/15 1015  Gross per 24 hour  Intake      0 ml  Output      0 ml    Net      0 ml   Filed Weights   08/11/15 2215  Weight: 66.815 kg (147 lb 4.8 oz)    Examination: General exam: lethargic  HEENT: PERRLA,not opening mouth Respiratory system: Clear to auscultation. Respiratory effort normal. Cardiovascular system: S1 & S2 heard, RRR.  No murmurs  Gastrointestinal system: Abdomen soft, non-tender, nondistended. Normal bowel sound. No organomegaly Central nervous system: Alert  No focal neurological deficits. Extremities: No cyanosis, clubbing or edema Skin: No rashes or ulcers Psychiatry:  Mood & affect appropriate.     Data Reviewed: I have personally reviewed following labs and imaging studies  CBC:  Recent Labs Lab 08/11/15 1652 08/12/15 1302 08/14/15 0400 08/15/15 0403  WBC 11.3* 9.0 8.1 7.3  NEUTROABS 8.8*  --   --   --   HGB 13.3 11.5* 12.1 13.5  HCT 41.2 35.9* 35.7* 40.4  MCV 97.4 98.6 93.5 93.3  PLT 271 223 222 [id number]   Basic Metabolic Panel:  Recent Labs Lab 08/13/15 0338 08/13/15 1517 08/14/15 0400 08/15/15 0403 08/16/15 0349  NA 143 140 140 139 140  K 2.9* 3.1* 3.8 3.9 3.8  CL 115* 112* 112* 109 110  CO2 22 20* 23 21* 23  GLUCOSE 157* 93 95 119* 101*  BUN 20 15 9  5* <5*  CREATININE 0.67 0.72 0.71 0.61 0.76  CALCIUM 8.1* 8.0* 8.4* 8.4* 8.1*  MG 1.7  --  1.6*  --  1.8   GFR: Estimated Creatinine Clearance: 47.8 mL/min (by C-G formula based on Cr of 0.76). Liver Function Tests:  Recent Labs Lab 08/11/15 1700 08/15/15 0403  AST 36 42*  ALT 23 22  ALKPHOS 65 57  BILITOT 1.8* 0.8  PROT 7.3 6.2*  ALBUMIN 3.6 2.9*    Recent Labs Lab 08/15/15 0403  LIPASE 20   No results for input(s): AMMONIA in the last 168 hours. Coagulation Profile: No results for input(s): INR, PROTIME in the last 168 hours. Cardiac Enzymes: No results for input(s): CKTOTAL, CKMB, CKMBINDEX, TROPONINI in the last 168 hours. BNP (last 3 results) No results for input(s): PROBNP in the last 8760 hours. HbA1C: No results for  input(s): HGBA1C in the last 72 hours. CBG: No results for input(s): GLUCAP in the last 168 hours. Lipid Profile: No results for input(s): CHOL, HDL, LDLCALC, TRIG, CHOLHDL, LDLDIRECT in the last 72 hours. Thyroid Function Tests: No results for input(s): TSH, T4TOTAL, FREET4, T3FREE, THYROIDAB in the last 72 hours. Anemia Panel: No results for input(s): VITAMINB12, FOLATE, FERRITIN, TIBC, IRON, RETICCTPCT in the last 72 hours. Urine analysis:    Component Value Date/Time   COLORURINE AMBER*  08/11/2015 1709   APPEARANCEUR TURBID* 08/11/2015 1709   LABSPEC 1.028 08/11/2015 1709   PHURINE 5.0 08/11/2015 1709   GLUCOSEU NEGATIVE 08/11/2015 1709   GLUCOSEU NEGATIVE 06/10/2015 1631   HGBUR NEGATIVE 08/11/2015 1709   BILIRUBINUR LARGE* 08/11/2015 1709   BILIRUBINUR negative 06/14/2015 1403   KETONESUR 15* 08/11/2015 1709   PROTEINUR 30* 08/11/2015 1709   PROTEINUR 2+ 06/14/2015 1403   UROBILINOGEN negative 06/14/2015 1403   UROBILINOGEN 1.0 06/10/2015 1631   NITRITE POSITIVE* 08/11/2015 1709   NITRITE negative 06/14/2015 1403   LEUKOCYTESUR SMALL* 08/11/2015 1709   Sepsis Labs: @LABRCNTIP (procalcitonin:4,lacticidven:4) ) Recent Results (from the past 240 hour(s))  Urine culture     Status: Abnormal   Collection Time: 08/11/15  5:09 PM  Result Value Ref Range Status   Specimen Description URINE, CATHETERIZED  Final   Special Requests Normal  Final   Culture >=100,000 COLONIES/mL ENTEROCOCCUS SPECIES (A)  Final   Report Status 08/13/2015 FINAL  Final   Organism ID, Bacteria ENTEROCOCCUS SPECIES (A)  Final      Susceptibility   Enterococcus species - MIC*    AMPICILLIN >=32 RESISTANT Resistant     LEVOFLOXACIN 2 SENSITIVE Sensitive     NITROFURANTOIN 64 INTERMEDIATE Intermediate     VANCOMYCIN <=0.5 SENSITIVE Sensitive     * >=100,000 COLONIES/mL ENTEROCOCCUS SPECIES         Radiology Studies: Dg Abd Portable 1v  08/15/2015  CLINICAL DATA:  Mid and lower abdominal pain  with constipation and vomiting. EXAM: PORTABLE ABDOMEN - 1 VIEW COMPARISON:  12/18/2012 FINDINGS: One scattered stool in the colon and down into the rectum. No findings for small bowel obstruction or free air. The soft tissue shadows are maintained. Rounded calcifications in the right buttock area are probably injection granulomas. There are stable SI joint and right hip joint degenerative changes. No acute bony findings. The visualized lung bases are clear. IMPRESSION: No plain film findings for an acute abdominal process. Electronically Signed   By: Marijo Sanes M.D.   On: 08/15/2015 12:50      Scheduled Meds: . collagenase   Topical Daily  . senna-docusate  2 tablet Oral QHS   Continuous Infusions: . dextrose 5 % and 0.45 % NaCl with KCl 20 mEq/L 100 mL/hr at 08/16/15 1530     LOS: 4 days    Time spent in minutes: 65    [place], MD [facility] Pager: [email or web address] Password [email or web address] 08/16/2015, 4:16 PM

## 2015-08-17 DIAGNOSIS — R627 Adult failure to thrive: Secondary | ICD-10-CM

## 2015-08-17 DIAGNOSIS — N390 Urinary tract infection, site not specified: Secondary | ICD-10-CM

## 2015-08-17 DIAGNOSIS — N179 Acute kidney failure, unspecified: Principal | ICD-10-CM

## 2015-08-17 DIAGNOSIS — L8990 Pressure ulcer of unspecified site, unspecified stage: Secondary | ICD-10-CM

## 2015-08-17 DIAGNOSIS — E860 Dehydration: Secondary | ICD-10-CM

## 2015-08-17 MED ORDER — OMEPRAZOLE 40 MG PO CPDR: 40.0000 mg | DELAYED_RELEASE_CAPSULE | Freq: Every day | ORAL | Status: DC

## 2015-08-17 MED ORDER — SENNOSIDES-DOCUSATE SODIUM 8.6-50 MG PO TABS: 2.0000 | ORAL_TABLET | Freq: Every evening | ORAL | Status: DC | PRN

## 2015-08-17 MED ORDER — BISACODYL 10 MG RE SUPP: 10.0000 mg | Freq: Every day | RECTAL | Status: AC | PRN

## 2015-08-17 MED ORDER — MUPIROCIN 2 % EX OINT: 1.0000 "application " | TOPICAL_OINTMENT | Freq: Three times a day (TID) | CUTANEOUS | Status: DC

## 2015-08-17 MED ORDER — COLLAGENASE 250 UNIT/GM EX OINT: TOPICAL_OINTMENT | Freq: Every day | CUTANEOUS | Status: DC

## 2015-08-17 NOTE — Discharge Summary (Signed)
Physician Discharge Summary  Rose Sullivan [id number] DOB: 1933-03-22 DOA: 08/11/2015  PCP: [name], MD  Admit date: 08/11/2015 Discharge date: 08/17/2015  Disposition:  Home  Recommendations for Outpatient Follow-up:  1. Follow up with PCP in 1 weeks 2. Please obtain BMP/CBC in one week  Discharge Condition: stable, guarded CODE STATUS: DNR  Brief/Interim Summary: Rose Sullivan is a 82 y.o. female with medical history significant of 3 prior strokes, most recently had a fairly significant one just last month. She was ultimately sent home on hospice. History comes from daughter and chart as patient is unable to provide any history. She stopped eating about 2 wks ago and has become progressively lethargic. She has developed a pressure ulcer and is severely constipated. Disimpacted in ER. Found to have a UTI and admitted. Improved with appropriate antibiotics and was alert and working with PT. Began to vomit after meals and then fell asleep last night and is difficult to arouse now.    Dehydration/ hypernatremia - poor PO intake since 7/4 per daughter - hypernatremia and dehydration improved with IVFs. -Encourage oral intake at home, may need PEG if not able to keep up with eating and drinking  Acute encephalopathy/ Failure to thrive in adult - I think this is medication related as she had been on oral morphine regularly.   - Discontinued morphine and will de-escalate all sedatives to discontinue them - lethargic on admission- improved as we de-escalated all sedatives.   UTI (lower urinary tract infection) - Fully Treated - enterococcus on culture resistant to Amoxil - Levaquin 7/15- 7/17  - stopped today after 3 doses as her UTI is not a complicated UTI  Dysphagia - likely is aspiration- CXR does not show pneumonia - diet ordered by SLP- nectar thick liquids which she was able to swallow  Vomiting - suspect this is related to morphine and constipation and  untreated acid reflux (hiatal hernia) - PRN Zofran helped - abd xray unrevealing - checked LFTs and Lipase- WNL  Hypokalemia - replaced  - Mg normal  Constipation - discontinued morphine - disimpacted in ER - will need laxatives daily   Pressure ulcer - WOC eval- will need air mattress at home - ordered at discharge  DVT prophylaxis: SCD Code Status: DNR Family Communication: daughter Disposition Plan: home with [facility]   Discharge Diagnoses:  Principal Problem:   AKI (acute kidney injury) ([place]) Active Problems:   UTI (lower urinary tract infection)   Cerebrovascular accident (CVA) due to occlusion of left posterior cerebral artery ([place])   Dehydration   Failure to thrive in adult   Pressure ulcer  Discharge Instructions      Discharge Instructions    Discharge instructions    Complete by:  As directed   Air overlay mattress Frequent turns in bed to avoid skin breakdown Encourage liquids regularly     Increase activity slowly    Complete by:  As directed             Medication List    STOP taking these medications        morphine CONCENTRATE 10 MG/0.5ML Soln concentrated solution      TAKE these medications        bisacodyl 10 MG suppository  Commonly known as:  DULCOLAX  Place 1 suppository (10 mg total) rectally daily as needed for moderate constipation.     collagenase ointment  Commonly known as:  SANTYL  Apply topically daily.     mupirocin ointment 2 %  Commonly known as:  BACTROBAN  Apply 1 application topically 3 (three) times daily.     omeprazole 40 MG capsule  Commonly known as:  PRILOSEC  Take 1 capsule (40 mg total) by mouth daily.     senna-docusate 8.6-50 MG tablet  Commonly known as:  Senokot-S  Take 2 tablets by mouth at bedtime as needed for mild constipation.     VOLTAREN EX  Apply 1 application topically daily as needed (pain).       Follow-up Information    Follow up with Alvester Chou, NP. Schedule an appointment  as soon as possible for a visit in 5 days.   Specialty:  Nurse Practitioner   Why:  Hospital Follow Up   Contact information:   [place] [address] [phone]      Allergies  Allergen Reactions  . Trazodone And Nefazodone Other (See Comments)    hallucinations    Procedures/Studies: Dg Chest 2 View  08/11/2015  CLINICAL DATA:  82 year old female with history of stroke presenting with possible aspiration. EXAM: CHEST  2 VIEW COMPARISON:  Chest radiograph dated 07/16/2015 FINDINGS: The two views of the chest demonstrate emphysematous changes of the lungs with bibasilar atelectatic changes. There is no focal consolidation, pleural effusion, or pneumothorax. The cardiac silhouette is within normal limits with there is osteopenia with degenerative changes of the spine. There is a 3.1 x 3.2 cm sclerotic lesion in the humeral neck which appears grossly similar or minimally increased from prior study. A 1.6 x 1.5 cm right humeral neck sclerotic lesion noted. IMPRESSION: No active cardiopulmonary disease. Electronically Signed   By: Anner Crete M.D.   On: 08/11/2015 22:12   Dg Abd Portable 1v  08/15/2015  CLINICAL DATA:  Mid and lower abdominal pain with constipation and vomiting. EXAM: PORTABLE ABDOMEN - 1 VIEW COMPARISON:  12/18/2012 FINDINGS: One scattered stool in the colon and down into the rectum. No findings for small bowel obstruction or free air. The soft tissue shadows are maintained. Rounded calcifications in the right buttock area are probably injection granulomas. There are stable SI joint and right hip joint degenerative changes. No acute bony findings. The visualized lung bases are clear. IMPRESSION: No plain film findings for an acute abdominal process. Electronically Signed   By: Marijo Sanes M.D.   On: 08/15/2015 12:50   Subjective: Pt arousable, appears comfortable.   Discharge Exam: Filed Vitals:   08/17/15 0651  08/17/15 1434  BP: 172/84 179/88  Pulse: 73 79  Temp: 98.1 F (36.7 C) 98.5 F (36.9 C)  Resp:     Filed Vitals:   08/16/15 2222 08/16/15 2308 08/17/15 0651 08/17/15 1434  BP: 157/107 158/83 172/84 179/88  Pulse: 80  73 79  Temp: 97.4 F (36.3 C)  98.1 F (36.7 C) 98.5 F (36.9 C)  TempSrc: Oral  Oral Oral  Resp: 17     Height:      Weight:      SpO2: 100%  97% 100%    General exam: lethargic, but arousable HEENT: PERRLA,not opening mouth Respiratory system: Clear to auscultation. Respiratory effort normal. Cardiovascular system: S1 & S2 heard, RRR. No murmurs  Gastrointestinal system: Abdomen soft, non-tender, nondistended. Normal bowel sound. No organomegaly Central nervous system: Alert No focal neurological deficits. Extremities: No cyanosis, clubbing or edema Skin: No rashes or ulcers Psychiatry: Mood & affect appropriate.    The results of significant diagnostics from this hospitalization (including imaging,  microbiology, ancillary and laboratory) are listed below for reference.     Microbiology: Recent Results (from the past 240 hour(s))  Urine culture     Status: Abnormal   Collection Time: 08/11/15  5:09 PM  Result Value Ref Range Status   Specimen Description URINE, CATHETERIZED  Final   Special Requests Normal  Final   Culture >=100,000 COLONIES/mL ENTEROCOCCUS SPECIES (A)  Final   Report Status 08/13/2015 FINAL  Final   Organism ID, Bacteria ENTEROCOCCUS SPECIES (A)  Final      Susceptibility   Enterococcus species - MIC*    AMPICILLIN >=32 RESISTANT Resistant     LEVOFLOXACIN 2 SENSITIVE Sensitive     NITROFURANTOIN 64 INTERMEDIATE Intermediate     VANCOMYCIN <=0.5 SENSITIVE Sensitive     * >=100,000 COLONIES/mL ENTEROCOCCUS SPECIES     Labs: BNP (last 3 results) No results for input(s): BNP in the last 8760 hours. Basic Metabolic Panel:  Recent Labs Lab 08/13/15 0338 08/13/15 1517 08/14/15 0400 08/15/15 0403 08/16/15 0349  NA 143  140 140 139 140  K 2.9* 3.1* 3.8 3.9 3.8  CL 115* 112* 112* 109 110  CO2 22 20* 23 21* 23  GLUCOSE 157* 93 95 119* 101*  BUN 20 15 9  5* <5*  CREATININE 0.67 0.72 0.71 0.61 0.76  CALCIUM 8.1* 8.0* 8.4* 8.4* 8.1*  MG 1.7  --  1.6*  --  1.8   Liver Function Tests:  Recent Labs Lab 08/11/15 1700 08/15/15 0403  AST 36 42*  ALT 23 22  ALKPHOS 65 57  BILITOT 1.8* 0.8  PROT 7.3 6.2*  ALBUMIN 3.6 2.9*    Recent Labs Lab 08/15/15 0403  LIPASE 20   No results for input(s): AMMONIA in the last 168 hours. CBC:  Recent Labs Lab 08/11/15 1652 08/12/15 1302 08/14/15 0400 08/15/15 0403  WBC 11.3* 9.0 8.1 7.3  NEUTROABS 8.8*  --   --   --   HGB 13.3 11.5* 12.1 13.5  HCT 41.2 35.9* 35.7* 40.4  MCV 97.4 98.6 93.5 93.3  PLT 271 223 222 209   Cardiac Enzymes: No results for input(s): CKTOTAL, CKMB, CKMBINDEX, TROPONINI in the last 168 hours. BNP: Invalid input(s): POCBNP CBG: No results for input(s): GLUCAP in the last 168 hours. D-Dimer No results for input(s): DDIMER in the last 72 hours. Hgb A1c No results for input(s): HGBA1C in the last 72 hours. Lipid Profile No results for input(s): CHOL, HDL, LDLCALC, TRIG, CHOLHDL, LDLDIRECT in the last 72 hours. Thyroid function studies No results for input(s): TSH, T4TOTAL, T3FREE, THYROIDAB in the last 72 hours.  Invalid input(s): FREET3 Anemia work up No results for input(s): VITAMINB12, FOLATE, FERRITIN, TIBC, IRON, RETICCTPCT in the last 72 hours. Urinalysis    Component Value Date/Time   COLORURINE AMBER* 08/11/2015 1709   APPEARANCEUR TURBID* 08/11/2015 1709   LABSPEC 1.028 08/11/2015 1709   PHURINE 5.0 08/11/2015 1709   GLUCOSEU NEGATIVE 08/11/2015 1709   GLUCOSEU NEGATIVE 06/10/2015 1631   HGBUR NEGATIVE 08/11/2015 1709   BILIRUBINUR LARGE* 08/11/2015 1709   BILIRUBINUR negative 06/14/2015 1403   KETONESUR 15* 08/11/2015 1709   PROTEINUR 30* 08/11/2015 1709   PROTEINUR 2+ 06/14/2015 1403   UROBILINOGEN  negative 06/14/2015 1403   UROBILINOGEN 1.0 06/10/2015 1631   NITRITE POSITIVE* 08/11/2015 1709   NITRITE negative 06/14/2015 1403   LEUKOCYTESUR SMALL* 08/11/2015 1709   Sepsis Labs Invalid input(s): PROCALCITONIN,  WBC,  LACTICIDVEN Microbiology Recent Results (from the past 240 hour(s))  Urine culture  Status: Abnormal   Collection Time: 08/11/15  5:09 PM  Result Value Ref Range Status   Specimen Description URINE, CATHETERIZED  Final   Special Requests Normal  Final   Culture >=100,000 COLONIES/mL ENTEROCOCCUS SPECIES (A)  Final   Report Status 08/13/2015 FINAL  Final   Organism ID, Bacteria ENTEROCOCCUS SPECIES (A)  Final      Susceptibility   Enterococcus species - MIC*    AMPICILLIN >=32 RESISTANT Resistant     LEVOFLOXACIN 2 SENSITIVE Sensitive     NITROFURANTOIN 64 INTERMEDIATE Intermediate     VANCOMYCIN <=0.5 SENSITIVE Sensitive     * >=100,000 COLONIES/mL ENTEROCOCCUS SPECIES    Time coordinating discharge: 31 minutes  SIGNED:   Irwin Brakeman, MD  [facility] 08/17/2015, 2:50 PM Pager   If 7PM-7AM, please contact night-coverage [email or web address] Password [email or web address]

## 2015-08-17 NOTE — Progress Notes (Signed)
Nursing Discharge Summary  Patient ID: Rose Sullivan MRN: [id number] DOB/AGE: 04/15/1933 82 y.o.  Admit date: 08/11/2015 Discharge date: 08/17/2015  Discharged Condition: good  Disposition: 01-Home or Self Care  Follow-up Information    Follow up with Alvester Chou, NP. Schedule an appointment as soon as possible for a visit in 5 days.   Specialty:  Nurse Practitioner   Why:  Hospital Follow Up   Contact information:   [facility] [address] [place] [address] [phone]       Prescriptions Given: Prescriptions called into pharmacy.  Follow up appointments and medications discussed with daughter.  Daughter verbalized understanding without further questions.  Means of Discharge: Patient transported home via [facility]  Signed: Buel Ream 08/17/2015, 5:13 PM

## 2015-08-17 NOTE — Progress Notes (Signed)
Pt for ambulance transport home. Medical necessity form filled out and printed along with facesheet. Phone number for [facility] left on facesheet for RN to call when ready for DC. Marney Doctor RN,BSN,NCM [phone]

## 2015-08-17 NOTE — Discharge Instructions (Signed)
Dehydration Dehydration means your body does not have as much fluid as it needs. Your kidneys, brain, and heart will not work properly without the right amount of fluids and salt. Older adults are more likely to become dehydrated than younger adults. This is because:   Their bodies do not hold water as well.  Their bodies do not respond to temperature changes as well.  They do not get thirsty as easily or as quickly. HOME CARE  Ask your doctor how to replace body fluid losses (rehydrate).  Drink enough fluids to keep your pee (urine) clear or pale yellow.  Drink small amounts of fluids often if you feel sick to your stomach (nauseous) or throw up (vomit).  Eat like you normally do.  Avoid:  Foods or drinks high in sugar.  Bubbly (carbonated) drinks.  Juice.  Very hot or cold fluids.  Drinks with caffeine.  Fatty, greasy foods.  Alcohol.  Tobacco.  Eating too much.  Gelatin desserts.  Wash your hands to avoid spreading germs (bacteria, viruses).  Only take medicine as told by your doctor.  Keep all doctor visits as told. GET HELP IF:  You have belly (abdominal) pain that gets worse or stays in one spot (localizes).  You have a rash, stiff neck, or bad headache.  You get easily annoyed, sleepy, or are hard to wake up.  You feel weak, dizzy, or very thirsty.  You have a fever. GET HELP RIGHT AWAY IF:   You cannot drink fluid without throwing up.  You get worse even with treatment.  You throw up often.  You have watery poop (diarrhea) often.  Your vomit has blood in it or looks greenish.  Your poop (stool) has blood in it or looks black and tarry.  You have not peed in 6 to 8 hours or have only peed a small amount of very dark pee.  You pass out (faint). MAKE SURE YOU:   Understand these instructions.  Will watch your condition.  Will get help right away if you are not doing well or get worse.   This information is not intended to replace  advice given to you by your health care provider. Make sure you discuss any questions you have with your health care provider.   Document Released: 01/04/2011 Document Revised: 01/20/2013 Document Reviewed: 09/22/2012 Elsevier Interactive Patient Education 2016 [employer].    Constipation, Adult Constipation is when a person:  Poops (has a bowel movement) less than 3 times a week.  Has a hard time pooping.  Has poop that is dry, hard, or bigger than normal. HOME CARE   Eat foods with a lot of fiber in them. This includes fruits, vegetables, beans, and whole grains such as brown rice.  Avoid fatty foods and foods with a lot of sugar. This includes french fries, hamburgers, cookies, candy, and soda.  If you are not getting enough fiber from food, take products with added fiber in them (supplements).  Drink enough fluid to keep your pee (urine) clear or pale yellow.  Exercise on a regular basis, or as told by your doctor.  Go to the restroom when you feel like you need to poop. Do not hold it.  Only take medicine as told by your doctor. Do not take medicines that help you poop (laxatives) without talking to your doctor first. GET HELP RIGHT AWAY IF:   You have bright red blood in your poop (stool).  Your constipation lasts more than 4 days or  gets worse.  You have belly (abdominal) or butt (rectal) pain.  You have thin poop (as thin as a pencil).  You lose weight, and it cannot be explained. MAKE SURE YOU:   Understand these instructions.  Will watch your condition.  Will get help right away if you are not doing well or get worse.   This information is not intended to replace advice given to you by your health care provider. Make sure you discuss any questions you have with your health care provider.   Document Released: 07/04/2007 Document Revised: 02/05/2014 Document Reviewed: 10/27/2012 Elsevier Interactive Patient Education 2016 [employer]    Skin  Ulcer A skin ulcer is an open sore that can be shallow or deep. Skin ulcers sometimes become infected and are difficult to treat. It may be 1 month or longer before real healing progress is made. CAUSES   Injury.  Problems with the veins or arteries.  Diabetes.  Insect bites.  Bedsores.  Inflammatory conditions. SYMPTOMS   Pain, redness, swelling, and tenderness around the ulcer.  Fever.  Bleeding from the ulcer.  Yellow or clear fluid coming from the ulcer. DIAGNOSIS  There are many types of skin ulcers. Any open sores will be examined. Certain tests will be done to determine the kind of ulcer you have. The right treatment depends on the type of ulcer you have. TREATMENT  Treatment is a long-term challenge. It may include:  Wearing an elastic wrap, compression stockings, or gel cast over the ulcer area.  Taking antibiotic medicines or putting antibiotic creams on the affected area if there is an infection. HOME CARE INSTRUCTIONS  Put on your bandages (dressings), wraps, or casts over the ulcer as directed by your caregiver.  Change all dressings as directed by your caregiver.  Take all medicines as directed by your caregiver.  Keep the affected area clean and dry.  Avoid injuries to the affected area.  Eat a well-balanced, healthy diet that includes plenty of fruit and vegetables.  If you smoke, consider quitting or decreasing the amount of cigarettes you smoke.  Once the ulcer heals, get regular exercise as directed by your caregiver.  Work with your caregiver to make sure your blood pressure, cholesterol, and diabetes are well-controlled.  Keep your skin moisturized. Dry skin can crack and lead to skin ulcers. SEEK IMMEDIATE MEDICAL CARE IF:   Your pain gets worse.  You have swelling, redness, or fluids around the ulcer.  You have chills.  You have a fever. MAKE SURE YOU:   Understand these instructions.  Will watch your condition.  Will get help  right away if you are not doing well or get worse.   This information is not intended to replace advice given to you by your health care provider. Make sure you discuss any questions you have with your health care provider.   Document Released: 02/23/2004 Document Revised: 04/09/2011 Document Reviewed: 09/01/2010 Elsevier Interactive Patient Education 2016 Else[employer] Inc[employer]    Nausea and Vomiting Nausea is a sick feeling that often comes before throwing up (vomiting). Vomiting is a reflex where stomach contents come out of your mouth. Vomiting can cause severe loss of body fluids (dehydration). Children and elderly adults can become dehydrated quickly, especially if they also have diarrhea. Nausea and vomiting are symptoms of a condition or disease. It is important to find the cause of your symptoms. CAUSES   Direct irritation of the stomach lining. This irritation can result from increased acid production (gastroesophageal reflux  disease), infection, food poisoning, taking certain medicines (such as nonsteroidal anti-inflammatory drugs), alcohol use, or tobacco use.  Signals from the brain.These signals could be caused by a headache, heat exposure, an inner ear disturbance, increased pressure in the brain from injury, infection, a tumor, or a concussion, pain, emotional stimulus, or metabolic problems.  An obstruction in the gastrointestinal tract (bowel obstruction).  Illnesses such as diabetes, hepatitis, gallbladder problems, appendicitis, kidney problems, cancer, sepsis, atypical symptoms of a heart attack, or eating disorders.  Medical treatments such as chemotherapy and radiation.  Receiving medicine that makes you sleep (general anesthetic) during surgery. DIAGNOSIS Your caregiver may ask for tests to be done if the problems do not improve after a few days. Tests may also be done if symptoms are severe or if the reason for the nausea and vomiting is not clear. Tests may  include:  Urine tests.  Blood tests.  Stool tests.  Cultures (to look for evidence of infection).  X-rays or other imaging studies. Test results can help your caregiver make decisions about treatment or the need for additional tests. TREATMENT You need to stay well hydrated. Drink frequently but in small amounts.You may wish to drink water, sports drinks, clear broth, or eat frozen ice pops or gelatin dessert to help stay hydrated.When you eat, eating slowly may help prevent nausea.There are also some antinausea medicines that may help prevent nausea. HOME CARE INSTRUCTIONS   Take all medicine as directed by your caregiver.  If you do not have an appetite, do not force yourself to eat. However, you must continue to drink fluids.  If you have an appetite, eat a normal diet unless your caregiver tells you differently.  Eat a variety of complex carbohydrates (rice, wheat, potatoes, bread), lean meats, yogurt, fruits, and vegetables.  Avoid high-fat foods because they are more difficult to digest.  Drink enough water and fluids to keep your urine clear or pale yellow.  If you are dehydrated, ask your caregiver for specific rehydration instructions. Signs of dehydration may include:  Severe thirst.  Dry lips and mouth.  Dizziness.  Dark urine.  Decreasing urine frequency and amount.  Confusion.  Rapid breathing or pulse. SEEK IMMEDIATE MEDICAL CARE IF:   You have blood or brown flecks (like coffee grounds) in your vomit.  You have black or bloody stools.  You have a severe headache or stiff neck.  You are confused.  You have severe abdominal pain.  You have chest pain or trouble breathing.  You do not urinate at least once every 8 hours.  You develop cold or clammy skin.  You continue to vomit for longer than 24 to 48 hours.  You have a fever. MAKE SURE YOU:   Understand these instructions.  Will watch your condition.  Will get help right away if  you are not doing well or get worse.   This information is not intended to replace advice given to you by your health care provider. Make sure you discuss any questions you have with your health care provider.   Document Released: 01/15/2005 Document Revised: 04/09/2011 Document Reviewed: 06/14/2010 Elsevier Interactive Patient Education 2016 [employer]. Gastroesophageal Reflux Disease, Adult Normally, food travels down the esophagus and stays in the stomach to be digested. If a person has gastroesophageal reflux disease (GERD), food and stomach acid move back up into the esophagus. When this happens, the esophagus becomes sore and swollen (inflamed). Over time, GERD can make small holes (ulcers) in the lining of the  esophagus. HOME CARE Diet  Follow a diet as told by your doctor. You may need to avoid foods and drinks such as:  Coffee and tea (with or without caffeine).  Drinks that contain alcohol.  Energy drinks and sports drinks.  Carbonated drinks or sodas.  Chocolate and cocoa.  Peppermint and mint flavorings.  Garlic and onions.  Horseradish.  Spicy and acidic foods, such as peppers, chili powder, curry powder, vinegar, hot sauces, and BBQ sauce.  Citrus fruit juices and citrus fruits, such as oranges, lemons, and limes.  Tomato-based foods, such as red sauce, chili, salsa, and pizza with red sauce.  Fried and fatty foods, such as donuts, french fries, potato chips, and high-fat dressings.  High-fat meats, such as hot dogs, rib eye steak, sausage, ham, and bacon.  High-fat dairy items, such as whole milk, butter, and cream cheese.  Eat small meals often. Avoid eating large meals.  Avoid drinking large amounts of liquid with your meals.  Avoid eating meals during the 2-3 hours before bedtime.  Avoid lying down right after you eat.  Do not exercise right after you eat. General Instructions  Pay attention to any changes in your symptoms.  Take  over-the-counter and prescription medicines only as told by your doctor. Do not take aspirin, ibuprofen, or other NSAIDs unless your doctor says it is okay.  Do not use any tobacco products, including cigarettes, chewing tobacco, and e-cigarettes. If you need help quitting, ask your doctor.  Wear loose clothes. Do not wear anything tight around your waist.  Raise (elevate) the head of your bed about 6 inches (15 cm).  Try to lower your stress. If you need help doing this, ask your doctor.  If you are overweight, lose an amount of weight that is healthy for you. Ask your doctor about a safe weight loss goal.  Keep all follow-up visits as told by your doctor. This is important. GET HELP IF:  You have new symptoms.  You lose weight and you do not know why it is happening.  You have trouble swallowing, or it hurts to swallow.  You have wheezing or a cough that keeps happening.  Your symptoms do not get better with treatment.  You have a hoarse voice. GET HELP RIGHT AWAY IF:  You have pain in your arms, neck, jaw, teeth, or back.  You feel sweaty, dizzy, or light-headed.  You have chest pain or shortness of breath.  You throw up (vomit) and your throw up looks like blood or coffee grounds.  You pass out (faint).  Your poop (stool) is bloody or black.  You cannot swallow, drink, or eat.   This information is not intended to replace advice given to you by your health care provider. Make sure you discuss any questions you have with your health care provider.   Document Released: 07/04/2007 Document Revised: 10/06/2014 Document Reviewed: 05/12/2014 Elsevier Interactive Patient Education [employer].

## 2015-08-18 ENCOUNTER — Telehealth: Payer: Self-pay

## 2015-08-18 NOTE — Telephone Encounter (Signed)
Home Health Cert/Plan of Care received (06/25/2015 - 08/23/2015) and placed on MD's desk for signature  

## 2015-08-18 NOTE — Telephone Encounter (Signed)
Paperwork signed, faxed, copy sent to scan 

## 2015-08-25 ENCOUNTER — Ambulatory Visit: Payer: Medicare Other | Admitting: Internal Medicine

## 2015-09-13 DIAGNOSIS — I69391 Dysphagia following cerebral infarction: Secondary | ICD-10-CM | POA: Diagnosis not present

## 2015-09-13 DIAGNOSIS — I69320 Aphasia following cerebral infarction: Secondary | ICD-10-CM | POA: Diagnosis not present

## 2015-09-13 DIAGNOSIS — N390 Urinary tract infection, site not specified: Secondary | ICD-10-CM | POA: Diagnosis not present

## 2015-09-13 DIAGNOSIS — R1312 Dysphagia, oropharyngeal phase: Secondary | ICD-10-CM

## 2015-09-19 ENCOUNTER — Ambulatory Visit: Payer: Medicare Other | Admitting: Neurology

## 2015-09-20 ENCOUNTER — Emergency Department (HOSPITAL_COMMUNITY): Payer: Medicare Other

## 2015-09-20 ENCOUNTER — Encounter (HOSPITAL_COMMUNITY): Payer: Self-pay | Admitting: Emergency Medicine

## 2015-09-20 ENCOUNTER — Inpatient Hospital Stay (HOSPITAL_COMMUNITY)
Admission: EM | Admit: 2015-09-20 | Discharge: 2015-09-26 | DRG: 299 | Disposition: A | Discharge status: Hospice | Payer: Medicare Other | Attending: Internal Medicine | Admitting: Internal Medicine

## 2015-09-20 DIAGNOSIS — Z66 Do not resuscitate: Secondary | ICD-10-CM | POA: Diagnosis not present

## 2015-09-20 DIAGNOSIS — F0280 Dementia in other diseases classified elsewhere without behavioral disturbance: Secondary | ICD-10-CM | POA: Diagnosis present

## 2015-09-20 DIAGNOSIS — I4581 Long QT syndrome: Secondary | ICD-10-CM | POA: Diagnosis present

## 2015-09-20 DIAGNOSIS — R627 Adult failure to thrive: Secondary | ICD-10-CM | POA: Diagnosis present

## 2015-09-20 DIAGNOSIS — Z853 Personal history of malignant neoplasm of breast: Secondary | ICD-10-CM | POA: Diagnosis not present

## 2015-09-20 DIAGNOSIS — N183 Chronic kidney disease, stage 3 (moderate): Secondary | ICD-10-CM | POA: Diagnosis present

## 2015-09-20 DIAGNOSIS — R05 Cough: Secondary | ICD-10-CM

## 2015-09-20 DIAGNOSIS — R404 Transient alteration of awareness: Secondary | ICD-10-CM | POA: Diagnosis present

## 2015-09-20 DIAGNOSIS — I129 Hypertensive chronic kidney disease with stage 1 through stage 4 chronic kidney disease, or unspecified chronic kidney disease: Secondary | ICD-10-CM | POA: Diagnosis present

## 2015-09-20 DIAGNOSIS — E785 Hyperlipidemia, unspecified: Secondary | ICD-10-CM | POA: Diagnosis present

## 2015-09-20 DIAGNOSIS — R4182 Altered mental status, unspecified: Secondary | ICD-10-CM | POA: Diagnosis not present

## 2015-09-20 DIAGNOSIS — R638 Other symptoms and signs concerning food and fluid intake: Secondary | ICD-10-CM | POA: Diagnosis not present

## 2015-09-20 DIAGNOSIS — C7802 Secondary malignant neoplasm of left lung: Secondary | ICD-10-CM | POA: Diagnosis present

## 2015-09-20 DIAGNOSIS — Z8701 Personal history of pneumonia (recurrent): Secondary | ICD-10-CM

## 2015-09-20 DIAGNOSIS — R059 Cough, unspecified: Secondary | ICD-10-CM | POA: Diagnosis present

## 2015-09-20 DIAGNOSIS — Z888 Allergy status to other drugs, medicaments and biological substances status: Secondary | ICD-10-CM

## 2015-09-20 DIAGNOSIS — G9340 Encephalopathy, unspecified: Secondary | ICD-10-CM | POA: Diagnosis present

## 2015-09-20 DIAGNOSIS — C7801 Secondary malignant neoplasm of right lung: Secondary | ICD-10-CM | POA: Diagnosis present

## 2015-09-20 DIAGNOSIS — I471 Supraventricular tachycardia: Secondary | ICD-10-CM | POA: Diagnosis present

## 2015-09-20 DIAGNOSIS — I82401 Acute embolism and thrombosis of unspecified deep veins of right lower extremity: Secondary | ICD-10-CM | POA: Diagnosis present

## 2015-09-20 DIAGNOSIS — Z8673 Personal history of transient ischemic attack (TIA), and cerebral infarction without residual deficits: Secondary | ICD-10-CM

## 2015-09-20 DIAGNOSIS — I82419 Acute embolism and thrombosis of unspecified femoral vein: Secondary | ICD-10-CM | POA: Diagnosis not present

## 2015-09-20 DIAGNOSIS — I82411 Acute embolism and thrombosis of right femoral vein: Secondary | ICD-10-CM | POA: Diagnosis present

## 2015-09-20 DIAGNOSIS — E1122 Type 2 diabetes mellitus with diabetic chronic kidney disease: Secondary | ICD-10-CM | POA: Diagnosis present

## 2015-09-20 DIAGNOSIS — H548 Legal blindness, as defined in USA: Secondary | ICD-10-CM | POA: Diagnosis present

## 2015-09-20 DIAGNOSIS — Z823 Family history of stroke: Secondary | ICD-10-CM

## 2015-09-20 DIAGNOSIS — L8990 Pressure ulcer of unspecified site, unspecified stage: Secondary | ICD-10-CM | POA: Diagnosis present

## 2015-09-20 DIAGNOSIS — Z800 Family history of malignant neoplasm of digestive organs: Secondary | ICD-10-CM

## 2015-09-20 DIAGNOSIS — E43 Unspecified severe protein-calorie malnutrition: Secondary | ICD-10-CM | POA: Diagnosis present

## 2015-09-20 DIAGNOSIS — N390 Urinary tract infection, site not specified: Secondary | ICD-10-CM | POA: Diagnosis present

## 2015-09-20 DIAGNOSIS — Z7401 Bed confinement status: Secondary | ICD-10-CM

## 2015-09-20 DIAGNOSIS — C772 Secondary and unspecified malignant neoplasm of intra-abdominal lymph nodes: Secondary | ICD-10-CM | POA: Diagnosis present

## 2015-09-20 DIAGNOSIS — R1310 Dysphagia, unspecified: Secondary | ICD-10-CM | POA: Diagnosis present

## 2015-09-20 DIAGNOSIS — R5383 Other fatigue: Secondary | ICD-10-CM | POA: Diagnosis present

## 2015-09-20 DIAGNOSIS — E876 Hypokalemia: Secondary | ICD-10-CM | POA: Diagnosis not present

## 2015-09-20 DIAGNOSIS — I480 Paroxysmal atrial fibrillation: Secondary | ICD-10-CM | POA: Diagnosis present

## 2015-09-20 DIAGNOSIS — Z87440 Personal history of urinary (tract) infections: Secondary | ICD-10-CM

## 2015-09-20 DIAGNOSIS — L89153 Pressure ulcer of sacral region, stage 3: Secondary | ICD-10-CM | POA: Diagnosis present

## 2015-09-20 DIAGNOSIS — C7951 Secondary malignant neoplasm of bone: Secondary | ICD-10-CM | POA: Diagnosis present

## 2015-09-20 DIAGNOSIS — E860 Dehydration: Secondary | ICD-10-CM | POA: Diagnosis present

## 2015-09-20 DIAGNOSIS — G8929 Other chronic pain: Secondary | ICD-10-CM | POA: Diagnosis present

## 2015-09-20 DIAGNOSIS — G309 Alzheimer's disease, unspecified: Secondary | ICD-10-CM | POA: Diagnosis present

## 2015-09-20 DIAGNOSIS — K219 Gastro-esophageal reflux disease without esophagitis: Secondary | ICD-10-CM | POA: Diagnosis present

## 2015-09-20 DIAGNOSIS — F411 Generalized anxiety disorder: Secondary | ICD-10-CM | POA: Diagnosis present

## 2015-09-20 DIAGNOSIS — C50919 Malignant neoplasm of unspecified site of unspecified female breast: Secondary | ICD-10-CM | POA: Diagnosis not present

## 2015-09-20 DIAGNOSIS — Z9011 Acquired absence of right breast and nipple: Secondary | ICD-10-CM

## 2015-09-20 DIAGNOSIS — Z801 Family history of malignant neoplasm of trachea, bronchus and lung: Secondary | ICD-10-CM

## 2015-09-20 DIAGNOSIS — Z515 Encounter for palliative care: Secondary | ICD-10-CM | POA: Diagnosis present

## 2015-09-20 DIAGNOSIS — Z825 Family history of asthma and other chronic lower respiratory diseases: Secondary | ICD-10-CM

## 2015-09-20 DIAGNOSIS — Z170 Estrogen receptor positive status [ER+]: Secondary | ICD-10-CM

## 2015-09-20 DIAGNOSIS — C50911 Malignant neoplasm of unspecified site of right female breast: Secondary | ICD-10-CM | POA: Diagnosis present

## 2015-09-20 DIAGNOSIS — I82409 Acute embolism and thrombosis of unspecified deep veins of unspecified lower extremity: Secondary | ICD-10-CM

## 2015-09-20 DIAGNOSIS — Z6832 Body mass index (BMI) 32.0-32.9, adult: Secondary | ICD-10-CM

## 2015-09-20 DIAGNOSIS — E669 Obesity, unspecified: Secondary | ICD-10-CM | POA: Diagnosis present

## 2015-09-20 DIAGNOSIS — M8580 Other specified disorders of bone density and structure, unspecified site: Secondary | ICD-10-CM | POA: Diagnosis present

## 2015-09-20 DIAGNOSIS — M545 Low back pain: Secondary | ICD-10-CM | POA: Diagnosis present

## 2015-09-20 DIAGNOSIS — Z8249 Family history of ischemic heart disease and other diseases of the circulatory system: Secondary | ICD-10-CM

## 2015-09-20 DIAGNOSIS — Z79899 Other long term (current) drug therapy: Secondary | ICD-10-CM

## 2015-09-20 LAB — COMPREHENSIVE METABOLIC PANEL
ALBUMIN: 3.2 g/dL — AB (ref 3.5–5.0)
ALT: 21 U/L (ref 14–54)
ANION GAP: 9 (ref 5–15)
AST: 37 U/L (ref 15–41)
Alkaline Phosphatase: 68 U/L (ref 38–126)
BUN: 30 mg/dL — ABNORMAL HIGH (ref 6–20)
CALCIUM: 9.1 mg/dL (ref 8.9–10.3)
CHLORIDE: 102 mmol/L (ref 101–111)
CO2: 28 mmol/L (ref 22–32)
Creatinine, Ser: 1 mg/dL (ref 0.44–1.00)
GFR calc non Af Amer: 52 mL/min — ABNORMAL LOW (ref >60)
Glucose, Bld: 99 mg/dL (ref 65–99)
Potassium: 2.6 mmol/L — CL (ref 3.5–5.1)
SODIUM: 139 mmol/L (ref 135–145)
Total Bilirubin: 1.3 mg/dL — ABNORMAL HIGH (ref 0.3–1.2)
Total Protein: 6.7 g/dL (ref 6.5–8.1)

## 2015-09-20 LAB — CBC WITH DIFFERENTIAL/PLATELET
BASOS ABS: 0 10*3/uL (ref 0.0–0.1)
Basophils Relative: 0 %
EOS ABS: 0.5 10*3/uL (ref 0.0–0.7)
EOS PCT: 6 %
HCT: 38.3 % (ref 36.0–46.0)
HEMOGLOBIN: 12.7 g/dL (ref 12.0–15.0)
Lymphocytes Relative: 23 %
Lymphs Abs: 2.1 10*3/uL (ref 0.7–4.0)
MCH: 31.4 pg (ref 26.0–34.0)
MCHC: 33.2 g/dL (ref 30.0–36.0)
MCV: 94.8 fL (ref 78.0–100.0)
Monocytes Absolute: 1 10*3/uL (ref 0.1–1.0)
Monocytes Relative: 11 %
NEUTROS PCT: 60 %
Neutro Abs: 5.2 10*3/uL (ref 1.7–7.7)
PLATELETS: 253 10*3/uL (ref 150–400)
RBC: 4.04 MIL/uL (ref 3.87–5.11)
RDW: 14.4 % (ref 11.5–15.5)
WBC: 8.8 10*3/uL (ref 4.0–10.5)

## 2015-09-20 LAB — MAGNESIUM: Magnesium: 1.7 mg/dL (ref 1.7–2.4)

## 2015-09-20 LAB — LIPASE, BLOOD: Lipase: 41 U/L (ref 11–51)

## 2015-09-20 LAB — I-STAT CG4 LACTIC ACID, ED: LACTIC ACID, VENOUS: 2.15 mmol/L — AB (ref 0.5–1.9)

## 2015-09-20 MED ORDER — POTASSIUM CHLORIDE 10 MEQ/100ML IV SOLN
10.0000 meq | Freq: Once | INTRAVENOUS | Status: AC
  Administered 2015-09-20: 10 meq via INTRAVENOUS
  Filled 2015-09-20: qty 100

## 2015-09-20 MED ORDER — IOPAMIDOL (ISOVUE-300) INJECTION 61%
100.0000 mL | Freq: Once | INTRAVENOUS | Status: AC | PRN
  Administered 2015-09-20: 100 mL via INTRAVENOUS

## 2015-09-20 MED ORDER — HEPARIN BOLUS VIA INFUSION
1600.0000 [IU] | Freq: Once | INTRAVENOUS | Status: DC
  Filled 2015-09-20: qty 1600

## 2015-09-20 MED ORDER — HEPARIN (PORCINE) IN NACL 100-0.45 UNIT/ML-% IJ SOLN
900.0000 [IU]/h | INTRAMUSCULAR | Status: DC
  Filled 2015-09-20: qty 250

## 2015-09-20 MED ORDER — SODIUM CHLORIDE 0.9 % IV BOLUS (SEPSIS)
1000.0000 mL | Freq: Once | INTRAVENOUS | Status: AC
  Administered 2015-09-20: 1000 mL via INTRAVENOUS

## 2015-09-20 MED ORDER — MORPHINE SULFATE (PF) 2 MG/ML IV SOLN
2.0000 mg | Freq: Once | INTRAVENOUS | Status: AC
Start: 2015-09-20 — End: 2015-09-20
  Administered 2015-09-20: 2 mg via INTRAVENOUS
  Filled 2015-09-20: qty 1

## 2015-09-20 NOTE — ED Triage Notes (Signed)
Per [facility] pt from home , per EMS per pt 's daughter noticed altered mental status x 3 days. Hx dementia. Pt moaning upon arrival. Non verbal and bed ridden. Denies nausea nor emesis. Per EMS pt is hospice pt. Presents with DNR. Per EMS pt presents with distended abdomen.

## 2015-09-20 NOTE — ED Provider Notes (Signed)
[facility] DEPT Provider Note   CSN: [id number] Arrival date & time: 09/20/15  1742     History   Chief Complaint Chief Complaint  Patient presents with  . Abdominal Pain    HPI Rose Sullivan is a 82 y.o. female presents with abdominal distension and AMS. PMH significant for dementia, multiple CVAs with right sided hemeparesis, remote hx of breast cancer, HTN/HLD, CKD, A.fib not on anticoagulation, recent weight loss of 50 pounds, decreased PO intake over the past month and a half , multiple UTIs - admitted in July for this. Daughter is in the room and provides history. She provides all of her care. She reports over the past 3 days she has had a gradual decrease in her mental status and has noticed increased abdominal distension. She has been moaning in pain. Last bowel movement was a week ago. She states urine is malodorous and dark. Denies fever, rigors, SOB, cough, wheezing, vomiting. She has an appointment with Dr. Benson Norway tomorrow for consult for PEG placement but when she called the office they told her to come to the ER to be admitted.  HPI  Past Medical History:  Diagnosis Date  . Abdominal pain, left lower quadrant 04/01/2007  . Allergic rhinitis, cause unspecified 07/23/2012  . Anxiety state, unspecified 02/04/2013  . Atrial flutter ([place])    a. s/p remote ablation.  Marland Kitchen BACK PAIN 12/10/2007  . BLINDNESS, [place], [place] DEFINITION 11/25/2006  . BREAST CANCER, HX OF 11/25/2006  . Cancer ([place])   . CKD (chronic kidney disease), stage II    CKD II-III per labs  . GAIT DISTURBANCE 12/10/2007  . GERD 02/11/2007  . HYPERLIPIDEMIA   . Hypersomnia 05/18/2010  . HYPERTENSION   . Impaired glucose tolerance    a. hx of diet controlled DM per chart.  . LOW BACK PAIN, CHRONIC 11/25/2006  . Nocturia 05/18/2010  . Obesity   . OSTEOPENIA 02/11/2007  . PAF (paroxysmal atrial fibrillation) ([place])    a. started on amio 05/2015 by IM.  Marland Kitchen Pain in Soft Tissues of Limb 02/11/2007  . Shortness of  breath   . Stroke [facility])    a. Stroke 03/2015 with readmission 03/2015 for extension of stroke felt 2/2 hypotension from UTI.  b. Adm 05/2015 for encephalopathy encephalopathy secondary to left occipital stroke extension (not afib cardioembolic pattern, more likely related to hypotension from dehydration/UTI).  Marland Kitchen SVT (supraventricular tachycardia) [facility])     Patient Active Problem List   Diagnosis Date Noted  . Pressure ulcer 08/12/2015  . Failure to thrive in adult 08/11/2015  . Lethargy   . Urinary tract infectious disease   . Palliative care encounter   . Decreased oral intake   . UTI (urinary tract infection) 07/16/2015  . Altered consciousness 07/16/2015  . Type 2 diabetes mellitus with circulatory disorder ([place]) 07/15/2015  . Confusion   . Cerebrovascular accident (CVA) due to occlusion of left posterior cerebral artery ([place])   . Acute cystitis without hematuria   . Dehydration   . Weakness 06/18/2015  . Renal insufficiency 06/18/2015  . Acute respiratory failure with hypoxemia ([place]) 06/10/2015  . CVA (cerebral infarction) 04/12/2015  . AKI (acute kidney injury) ([place]) 04/12/2015  . UTI (lower urinary tract infection) 04/12/2015  . Acute UTI   . History of recent stroke   . Right sided weakness   . Acute ischemic VBA thalamic stroke ([place]) 03/16/2015  . Hemiparesis affecting dominant side as late effect of stroke ([place])   . Disorientation   .  Chronic back pain   . Benign essential HTN   . Paroxysmal atrial fibrillation (HCC)   . HLD (hyperlipidemia)   . Hx of breast cancer   . Legally blind   . Atrial fibrillation with RVR ([place]) 03/15/2015  . Acute CVA (cerebrovascular accident) ([place]) 03/14/2015  . CKD (chronic kidney disease)   . Altered mental status   . Acute encephalopathy 03/13/2015  . DM (diabetes mellitus) ([place]) 03/13/2015  . Cerebral embolism with cerebral infarction 03/13/2015  . Neck pain 02/09/2014  . Paresthesia of arm 02/09/2014  . Lumbar facet arthropathy  03/09/2013  . Unspecified hereditary and idiopathic peripheral neuropathy 03/09/2013  . Osteoarthritis of right hip 03/09/2013  . Osteoarthritis of right knee 03/09/2013  . Osteoarthritis of left knee 03/09/2013  . Anxiety state, unspecified 02/04/2013  . Chronic anticoagulation 11/04/2012  . Allergic rhinitis, cause unspecified 07/23/2012  . Bilateral hearing loss 06/27/2011  . Insomnia 06/27/2011  . Breast cancer metastasized to lung ([place]) 06/05/2011  . Preventative health care 01/21/2011  . Cervical radicular pain 01/09/2011  . Lumbar radicular pain 01/09/2011  . Vitamin D deficiency 08/31/2010  . Bilateral knee pain 08/31/2010  . Impaired glucose tolerance 05/18/2010  . Hypersomnia 05/18/2010  . Nocturia 05/18/2010  . Other specified forms of hearing loss 09/05/2009  . DYSPNEA 03/18/2009  . Atrial fibrillation ([place]) 03/07/2009  . FATIGUE 12/10/2007  . GAIT DISTURBANCE 12/10/2007  . Hyperlipidemia 02/11/2007  . Essential hypertension 02/11/2007  . GERD 02/11/2007  . OSTEOPENIA 02/11/2007  . BLINDNESS, [place], [place] DEFINITION 11/25/2006  . LOW BACK PAIN, CHRONIC 11/25/2006  . BREAST CANCER, HX OF 11/25/2006    Past Surgical History:  Procedure Laterality Date  . APPENDECTOMY    . CATARACT EXTRACTION    . DILATION AND CURETTAGE OF UTERUS    . KNEE SURGERY Right   . s/p Mastectomy Right     OB History    No data available       Home Medications    Prior to Admission medications   Medication Sig Start Date End Date Taking? Authorizing Provider  bisacodyl (DULCOLAX) 10 MG suppository Place 1 suppository (10 mg total) rectally daily as needed for moderate constipation. 08/17/15   Clanford Marisa Hua, MD  collagenase (SANTYL) ointment Apply topically daily. 08/17/15   Clanford Marisa Hua, MD  Diclofenac Sodium (VOLTAREN EX) Apply 1 application topically daily as needed (pain).    Historical Provider, MD  mupirocin ointment (BACTROBAN) 2 % Apply 1 application topically 3  (three) times daily. 08/17/15   Clanford Marisa Hua, MD  omeprazole (PRILOSEC) 40 MG capsule Take 1 capsule (40 mg total) by mouth daily. 08/17/15   Clanford Marisa Hua, MD  senna-docusate (SENOKOT-S) 8.6-50 MG tablet Take 2 tablets by mouth at bedtime as needed for mild constipation. 08/17/15   Clanford Marisa Hua, MD    Family History Family History  Problem Relation Age of Onset  . Stroke Mother   . Colon cancer Father   . Hypertension Brother   . Lung cancer Brother   . COPD Brother     Social History Social History  Substance Use Topics  . Smoking status: Never Smoker  . Smokeless tobacco: Never Used     Comment: quit 40 years ago  . Alcohol use No     Allergies   Trazodone and nefazodone   Review of Systems Review of Systems  Unable to perform ROS: Dementia   Physical Exam Updated Vital Signs BP (!) 145/111 (BP Location: Right Arm)  Pulse 81   Temp 97.5 F (36.4 C) (Axillary)   Resp 16   SpO2 97%   Physical Exam  Constitutional: She is oriented to person, place, and time. She appears well-developed and well-nourished. No distress.  Elderly female moaning in pain  HENT:  Head: Normocephalic and atraumatic.  Eyes: Conjunctivae are normal. Pupils are equal, round, and reactive to light. Right eye exhibits no discharge. Left eye exhibits no discharge. No scleral icterus.  Neck: Normal range of motion. Neck supple.  Cardiovascular: Normal rate and regular rhythm.  Exam reveals no gallop and no friction rub.   No murmur heard. Pulmonary/Chest: Effort normal and breath sounds normal. No respiratory distress. She has no wheezes. She has no rales. She exhibits no tenderness.  Abdominal: Soft. Bowel sounds are normal. She exhibits distension. She exhibits no mass. There is tenderness. There is no rebound and no guarding. No hernia.  Unable to localize tenderness due to dementia; per daughter abdomen is distended  Genitourinary:  Genitourinary Comments: Small amount of  stool in rectum which was disimpacted. No hemorrhoids, fissures, redness, area of fluctuance, lesions, or tenderness. Chaperone present during exam.    Musculoskeletal: She exhibits no edema.  Neurological: She is alert and oriented to person, place, and time.  Skin: Skin is warm and dry.  Psychiatric: She has a normal mood and affect. Her behavior is normal.  Nursing note and vitals reviewed.    ED Treatments / Results  Labs (all labs ordered are listed, but only abnormal results are displayed) Labs Reviewed  COMPREHENSIVE METABOLIC PANEL - Abnormal; Notable for the following:       Result Value   Potassium 2.6 (*)    BUN 30 (*)    Albumin 3.2 (*)    Total Bilirubin 1.3 (*)    GFR calc non Af Amer 52 (*)    All other components within normal limits  URINALYSIS, ROUTINE W REFLEX MICROSCOPIC (NOT AT [facility]) - Abnormal; Notable for the following:    Color, Urine AMBER (*)    APPearance TURBID (*)    Specific Gravity, Urine >1.046 (*)    Bilirubin Urine SMALL (*)    Protein, ur 30 (*)    Nitrite POSITIVE (*)    Leukocytes, UA SMALL (*)    All other components within normal limits  BASIC METABOLIC PANEL - Abnormal; Notable for the following:    Potassium 2.9 (*)    BUN 22 (*)    Calcium 8.0 (*)    All other components within normal limits  MAGNESIUM - Abnormal; Notable for the following:    Magnesium 1.6 (*)    All other components within normal limits  CBC - Abnormal; Notable for the following:    RBC 3.35 (*)    Hemoglobin 10.6 (*)    HCT 31.8 (*)    All other components within normal limits  URINE MICROSCOPIC-ADD ON - Abnormal; Notable for the following:    Squamous Epithelial / LPF 0-5 (*)    Bacteria, UA MANY (*)    Crystals CA OXALATE CRYSTALS (*)    All other components within normal limits  CBC - Abnormal; Notable for the following:    RBC 3.82 (*)    All other components within normal limits  RENAL FUNCTION PANEL - Abnormal; Notable for the following:     Potassium 5.5 (*)    CO2 20 (*)    Glucose, Bld 147 (*)    Calcium 7.9 (*)    Phosphorus 1.0 (*)  Albumin 2.6 (*)    All other components within normal limits  I-STAT CG4 LACTIC ACID, ED - Abnormal; Notable for the following:    Lactic Acid, Venous 2.15 (*)    All other components within normal limits  CBC WITH DIFFERENTIAL/PLATELET  LIPASE, BLOOD  MAGNESIUM  PROTIME-INR  APTT    EKG  EKG Interpretation None       Radiology Ct Abdomen Pelvis W Contrast  Result Date: 09/20/2015 CLINICAL DATA:  Patient with altered mental status for 3 days. History of dementia. Distended abdomen. EXAM: CT ABDOMEN AND PELVIS WITH CONTRAST TECHNIQUE: Multidetector CT imaging of the abdomen and pelvis was performed using the standard protocol following bolus administration of intravenous contrast. CONTRAST:  161mL ISOVUE-300 IOPAMIDOL (ISOVUE-300) INJECTION 61% COMPARISON:  CT abdomen pelvis 04/02/2007. FINDINGS: Lower chest: Normal heart size. Small bilateral pleural effusions. Subpleural ground-glass opacities within the lower lobes bilaterally. Hepatobiliary: The liver is normal in size and contour. Patient status post cholecystectomy. Intrahepatic and extrahepatic biliary ductal dilatation. Common bile duct measures up to 19 mm. Pancreas: Unremarkable Spleen: Unremarkable Adrenals/Urinary Tract: Normal adrenal glands. Kidneys enhance symmetrically with contrast. No hydronephrosis. Urinary bladder is unremarkable. Small left excreted contrast dependently within the urinary bladder. Stomach/Bowel: Sigmoid colonic diverticulosis. No CT evidence for acute diverticulitis. No abnormal bowel wall thickening or evidence for bowel obstruction. No free fluid or free intraperitoneal air. Vascular/Lymphatic: Peripheral calcified atherosclerotic plaque involving the abdominal aorta. Prominent upper abdominal and porta hepatic lymph nodes measuring up to 16 mm (image 23; series 2). Filling defect demonstrated within  the right common femoral vein (image 69; series 5). Other: Uterus and adnexal structures unremarkable. Small cystic structure within the right adnexa. Musculoskeletal: There is a 1.9 cm sclerotic lesion within the right ilium (image 53; series 2). There is a 0.8 cm sclerotic lesion within the left ilium (image 58; series 2). 1.7 cm sclerotic lesion within the L4 vertebral body. Lower thoracic and lumbar spine degenerative changes. Osseous destruction of the posterior right tenth rib with associated 1.9 cm soft tissue mass (image 15; series 2). 140 cm soft tissue mass overlying the lower right hemi thorax (image 25; series 2). IMPRESSION: Filling defect demonstrated within the right common femoral vein most suggestive of deep venous thrombosis. Marked dilatation of the common bile duct measuring up to 19 mm. While this may potentially be physiologic in the setting of prior cholecystectomy, obstruction not excluded. Recommend laboratory correlation. Multiple prominent mildly enlarged upper abdominal lymph nodes raising the possibility of metastatic adenopathy. Sclerotic osseous metastasis involving the right and left ilium and L4 vertebral body. Osseous metastasis involving the posterior right tenth rib with associated soft tissue mass. Small bilateral pleural effusions. These results were called by telephone at the time of interpretation on 09/20/2015 at 8:28 pm to Dr. Janetta Hora , who verbally acknowledged these results. Electronically Signed   By: Lovey Newcomer M.D.   On: 09/20/2015 20:31    Procedures Procedures (including critical care time)  CRITICAL CARE Performed by: Recardo Evangelist   Total critical care time: 30 minutes  Critical care time was exclusive of separately billable procedures and treating other patients.  Critical care was necessary to treat or prevent imminent or life-threatening deterioration.  Critical care was time spent personally by me on the following activities: development  of treatment plan with patient and/or surrogate as well as nursing, discussions with consultants, evaluation of patient's response to treatment, examination of patient, obtaining history from patient or surrogate, ordering and performing  treatments and interventions, ordering and review of laboratory studies, ordering and review of radiographic studies, pulse oximetry and re-evaluation of patient's condition.   Medications Ordered in ED Medications  sodium chloride 0.9 % bolus 1,000 mL (not administered)  heparin ADULT infusion 100 units/mL (25000 units/212mL sodium chloride 0.45%) (not administered)  heparin bolus via infusion 1,600 Units (not administered)  sodium chloride 0.9 % bolus 1,000 mL (0 mLs Intravenous Stopped 09/20/15 2202)  morphine 2 MG/ML injection 2 mg (2 mg Intravenous Given 09/20/15 1846)  potassium chloride 10 mEq in 100 mL IVPB (0 mEq Intravenous Stopped 09/20/15 2203)  iopamidol (ISOVUE-300) 61 % injection 100 mL (100 mLs Intravenous Contrast Given 09/20/15 1953)     Initial Impression / Assessment and Plan / ED Course  I have reviewed the triage vital signs and the nursing notes.  Pertinent labs & imaging results that were available during my care of the patient were reviewed by me and considered in my medical decision making (see chart for details).  Clinical Course   82 year old female presents R DVT, metastatic cancer, hypokalemia, and increased lactic acid. Patient is afebrile, not tachycardic or tachypneic, normotensive, and not hypoxic. CBC is unremarkable. CMP remarkable for hypokalemia of 2.6 which is down from 3.8 on 7/18. 9mEq IV K started as well as IVF. BUN is elevated but. SCr is normal. Albumin is mildly low. Lactic acid is 2.15. EKG is NSR and prolonged QT. CXR negative. UA is pending.  CT of abdomen is remarkable for DVT in the right common femoral vein. Marked dilatation of the common bile duct measuring up to 19 mm. Multiple prominent mildly enlarged  upper abdominal lymph nodes raising the possibility of metastatic adenopathy. Sclerotic osseous metastasis involving the right and left ilium and L4 vertebral body. Osseous metastasis involving the posterior right tenth rib with associated soft tissue mass. Small bilateral pleural effusions.   Spoke with Dr. Jonnie Finner who will admit patient for further management.  Final Clinical Impressions(s) / ED Diagnoses   Final diagnoses:  Acute deep vein thrombosis (DVT) of femoral vein of right lower extremity (HCC)  Hypokalemia  Failure to thrive in adult    New Prescriptions New Prescriptions   No medications on file     Recardo Evangelist, PA-C 09/23/15 1423

## 2015-09-20 NOTE — Progress Notes (Signed)
ANTICOAGULATION CONSULT NOTE - Initial Consult  Pharmacy Consult for heparin Indication:VTE Treatment  Allergies  Allergen Reactions  . Trazodone And Nefazodone Other (See Comments)    Reaction:  Hallucinations     Patient Measurements:   Heparin Dosing Weight: 66.8kg  Vital Signs: Temp: 97.5 F (36.4 C) (08/22 1801) Temp Source: Axillary (08/22 1801) BP: 146/85 (08/22 1934) Pulse Rate: 70 (08/22 1934)  Labs:  Recent Labs  09/20/15 1836  HGB 12.7  HCT 38.3  PLT 253  CREATININE 1.00    CrCl cannot be calculated (Unknown ideal weight.).   Medical History: Past Medical History:  Diagnosis Date  . Anxiety state, unspecified 02/04/2013  . Atrial flutter ([place])    a. s/p remote ablation.  Marland Kitchen BACK PAIN 12/10/2007  . BLINDNESS, [place], [place] DEFINITION 11/25/2006  . BREAST CANCER, HX OF 11/25/2006  . CKD (chronic kidney disease), stage II    CKD II-III per labs  . GAIT DISTURBANCE 12/10/2007  . GERD 02/11/2007  . HYPERLIPIDEMIA   . Hypersomnia 05/18/2010  . HYPERTENSION   . Impaired glucose tolerance    a. hx of diet controlled DM per chart.  . LOW BACK PAIN, CHRONIC 11/25/2006  . Nocturia 05/18/2010  . Obesity   . OSTEOPENIA 02/11/2007  . PAF (paroxysmal atrial fibrillation) ([place])    a. started on amio 05/2015 by IM.  Marland Kitchen Stroke [facility])    a. Stroke 03/2015 with readmission 03/2015 for extension of stroke felt 2/2 hypotension from UTI.  b. Adm 05/2015 for encephalopathy encephalopathy secondary to left occipital stroke extension (not afib cardioembolic pattern, more likely related to hypotension from dehydration/UTI).  Marland Kitchen SVT (supraventricular tachycardia) [facility])      Assessment: 82 year old female here with worsening abdominal pain as well as intermittent vomiting. Has hypokalemia as well as possible right femoral DVT, pharmacy consulted to start heparin.  PTT, PT/INR ordered Plts WNL H/H WNL  Goal of Therapy:  Heparin level 0.3-0.7 units/ml  Monitor platelets by  anticoagulation protocol  Plan:  Heparin bolus 1600 units x 1 then Heparin drip 900 units/hr Heparin level in 8 hours Daily CBC/heparin level   Dolly Rias RPh 09/20/2015, 10:03 PM Pager [phone]

## 2015-09-20 NOTE — ED Notes (Signed)
Blood draw unsuccessful RN notified. 

## 2015-09-20 NOTE — ED Notes (Signed)
Bed: [id number] Expected date:  Expected time:  Means of arrival:  Comments: EMS- 82yo F, AMS/lethargic/abdominal distention

## 2015-09-20 NOTE — ED Provider Notes (Signed)
Medical screening examination/treatment/procedure(s) were conducted as a shared visit with non-physician practitioner(s) and myself.  I personally evaluated the patient during the encounter.   EKG Interpretation None     82 year old female here with worsening abdominal pain as well as intermittent vomiting. Has hypokalemia as well as possible right femoral DVT. Will consult hospice for admission   Lacretia Leigh, MD 09/20/15 2145

## 2015-09-21 ENCOUNTER — Other Ambulatory Visit: Payer: Self-pay | Admitting: *Deleted

## 2015-09-21 DIAGNOSIS — C50919 Malignant neoplasm of unspecified site of unspecified female breast: Secondary | ICD-10-CM | POA: Diagnosis present

## 2015-09-21 DIAGNOSIS — R059 Cough, unspecified: Secondary | ICD-10-CM | POA: Diagnosis present

## 2015-09-21 DIAGNOSIS — Z8673 Personal history of transient ischemic attack (TIA), and cerebral infarction without residual deficits: Secondary | ICD-10-CM

## 2015-09-21 DIAGNOSIS — I82409 Acute embolism and thrombosis of unspecified deep veins of unspecified lower extremity: Secondary | ICD-10-CM

## 2015-09-21 DIAGNOSIS — I82401 Acute embolism and thrombosis of unspecified deep veins of right lower extremity: Secondary | ICD-10-CM | POA: Diagnosis present

## 2015-09-21 DIAGNOSIS — E876 Hypokalemia: Secondary | ICD-10-CM

## 2015-09-21 DIAGNOSIS — R05 Cough: Secondary | ICD-10-CM | POA: Diagnosis present

## 2015-09-21 DIAGNOSIS — R5383 Other fatigue: Secondary | ICD-10-CM

## 2015-09-21 DIAGNOSIS — I82411 Acute embolism and thrombosis of right femoral vein: Principal | ICD-10-CM

## 2015-09-21 DIAGNOSIS — G9340 Encephalopathy, unspecified: Secondary | ICD-10-CM

## 2015-09-21 DIAGNOSIS — R627 Adult failure to thrive: Secondary | ICD-10-CM

## 2015-09-21 DIAGNOSIS — H548 Legal blindness, as defined in USA: Secondary | ICD-10-CM

## 2015-09-21 DIAGNOSIS — Z66 Do not resuscitate: Secondary | ICD-10-CM | POA: Diagnosis present

## 2015-09-21 LAB — URINALYSIS, ROUTINE W REFLEX MICROSCOPIC
GLUCOSE, UA: NEGATIVE mg/dL
HGB URINE DIPSTICK: NEGATIVE
KETONES UR: NEGATIVE mg/dL
Nitrite: POSITIVE — AB
PH: 5.5 (ref 5.0–8.0)
Protein, ur: 30 mg/dL — AB

## 2015-09-21 LAB — APTT: aPTT: 27 seconds (ref 24–36)

## 2015-09-21 LAB — BASIC METABOLIC PANEL
Anion gap: 7 (ref 5–15)
BUN: 22 mg/dL — ABNORMAL HIGH (ref 6–20)
CO2: 25 mmol/L (ref 22–32)
Calcium: 8 mg/dL — ABNORMAL LOW (ref 8.9–10.3)
Chloride: 109 mmol/L (ref 101–111)
Creatinine, Ser: 0.78 mg/dL (ref 0.44–1.00)
GFR calc Af Amer: >60 mL/min (ref >60)
GFR calc non Af Amer: >60 mL/min (ref >60)
Glucose, Bld: 81 mg/dL (ref 65–99)
Potassium: 2.9 mmol/L — ABNORMAL LOW (ref 3.5–5.1)
Sodium: 141 mmol/L (ref 135–145)

## 2015-09-21 LAB — PROTIME-INR
INR: 1.12
PROTHROMBIN TIME: 14.5 s (ref 11.4–15.2)

## 2015-09-21 LAB — URINE MICROSCOPIC-ADD ON

## 2015-09-21 LAB — MAGNESIUM: Magnesium: 1.6 mg/dL — ABNORMAL LOW (ref 1.7–2.4)

## 2015-09-21 MED ORDER — CHLORHEXIDINE GLUCONATE 0.12 % MT SOLN
15.0000 mL | Freq: Two times a day (BID) | OROMUCOSAL | Status: DC
  Administered 2015-09-21 – 2015-09-25 (×8): 15 mL via OROMUCOSAL
  Filled 2015-09-21 (×6): qty 15

## 2015-09-21 MED ORDER — POTASSIUM CHLORIDE 10 MEQ/100ML IV SOLN
INTRAVENOUS | Status: AC
  Administered 2015-09-21: 10 meq
  Filled 2015-09-21: qty 100

## 2015-09-21 MED ORDER — SENNOSIDES-DOCUSATE SODIUM 8.6-50 MG PO TABS: 2.0000 | ORAL_TABLET | Freq: Every evening | ORAL | Status: DC | PRN

## 2015-09-21 MED ORDER — BISACODYL 10 MG RE SUPP: 10.0000 mg | Freq: Every day | RECTAL | Status: DC | PRN

## 2015-09-21 MED ORDER — ONDANSETRON HCL 4 MG/2ML IJ SOLN: 4.0000 mg | Freq: Four times a day (QID) | INTRAMUSCULAR | Status: DC | PRN

## 2015-09-21 MED ORDER — APIXABAN 5 MG PO TABS: 5.0000 mg | ORAL_TABLET | Freq: Two times a day (BID) | ORAL | Status: DC

## 2015-09-21 MED ORDER — MORPHINE SULFATE (PF) 2 MG/ML IV SOLN
2.0000 mg | Freq: Once | INTRAVENOUS | Status: AC
  Administered 2015-09-21: 2 mg via INTRAVENOUS
  Filled 2015-09-21: qty 1

## 2015-09-21 MED ORDER — ACETAMINOPHEN 325 MG PO TABS
650.0000 mg | ORAL_TABLET | Freq: Four times a day (QID) | ORAL | Status: DC | PRN
  Administered 2015-09-22: 650 mg via ORAL
  Filled 2015-09-21: qty 2

## 2015-09-21 MED ORDER — BISACODYL 10 MG RE SUPP
10.0000 mg | Freq: Every day | RECTAL | Status: DC | PRN
  Administered 2015-09-21: 10 mg via RECTAL
  Filled 2015-09-21: qty 1

## 2015-09-21 MED ORDER — POTASSIUM CHLORIDE CRYS ER 20 MEQ PO TBCR
40.0000 meq | EXTENDED_RELEASE_TABLET | Freq: Once | ORAL | Status: AC
Start: 2015-09-21 — End: 2015-09-21
  Administered 2015-09-21: 40 meq via ORAL
  Filled 2015-09-21: qty 2

## 2015-09-21 MED ORDER — HYDROCODONE-ACETAMINOPHEN 5-325 MG PO TABS: 1.0000 | ORAL_TABLET | ORAL | Status: DC | PRN

## 2015-09-21 MED ORDER — ONDANSETRON HCL 4 MG PO TABS: 4.0000 mg | ORAL_TABLET | Freq: Four times a day (QID) | ORAL | Status: DC | PRN

## 2015-09-21 MED ORDER — POTASSIUM CHLORIDE 20 MEQ PO PACK
40.0000 meq | PACK | Freq: Once | ORAL | Status: DC
  Filled 2015-09-21: qty 2

## 2015-09-21 MED ORDER — KCL IN DEXTROSE-NACL 30-5-0.45 MEQ/L-%-% IV SOLN
INTRAVENOUS | Status: DC
  Administered 2015-09-21 – 2015-09-23 (×5): via INTRAVENOUS
  Filled 2015-09-21 (×7): qty 1000

## 2015-09-21 MED ORDER — APIXABAN 5 MG PO TABS
10.0000 mg | ORAL_TABLET | Freq: Two times a day (BID) | ORAL | Status: DC
  Administered 2015-09-21 – 2015-09-22 (×4): 10 mg via ORAL
  Filled 2015-09-21 (×5): qty 2

## 2015-09-21 MED ORDER — CETYLPYRIDINIUM CHLORIDE 0.05 % MT LIQD
7.0000 mL | Freq: Two times a day (BID) | OROMUCOSAL | Status: DC
  Administered 2015-09-21 – 2015-09-23 (×5): 7 mL via OROMUCOSAL

## 2015-09-21 MED ORDER — POTASSIUM CHLORIDE 10 MEQ/100ML IV SOLN
10.0000 meq | INTRAVENOUS | Status: AC
  Administered 2015-09-21 (×3): 10 meq via INTRAVENOUS
  Filled 2015-09-21 (×2): qty 100

## 2015-09-21 MED ORDER — MORPHINE SULFATE 10 MG/5ML PO SOLN
2.5000 mg | ORAL | Status: DC | PRN
  Administered 2015-09-21 (×4): 2.5 mg via ORAL
  Filled 2015-09-21 (×4): qty 5

## 2015-09-21 MED ORDER — RESOURCE THICKENUP CLEAR PO POWD
ORAL | Status: DC | PRN
  Filled 2015-09-21: qty 125

## 2015-09-21 MED ORDER — ENSURE ENLIVE PO LIQD
237.0000 mL | Freq: Two times a day (BID) | ORAL | Status: DC
  Administered 2015-09-21 – 2015-09-22 (×2): 237 mL via ORAL

## 2015-09-21 MED ORDER — MORPHINE SULFATE (PF) 2 MG/ML IV SOLN
2.0000 mg | INTRAVENOUS | Status: DC | PRN
  Administered 2015-09-21 – 2015-09-22 (×8): 2 mg via INTRAVENOUS
  Filled 2015-09-21 (×9): qty 1

## 2015-09-21 MED ORDER — MAGNESIUM SULFATE 2 GM/50ML IV SOLN
2.0000 g | Freq: Once | INTRAVENOUS | Status: AC
  Administered 2015-09-21: 2 g via INTRAVENOUS
  Filled 2015-09-21: qty 50

## 2015-09-21 MED ORDER — ACETAMINOPHEN 650 MG RE SUPP: 650.0000 mg | Freq: Four times a day (QID) | RECTAL | Status: DC | PRN

## 2015-09-21 NOTE — Progress Notes (Signed)
The patient's daughter expressed a lot of concerns with the fact "mama isn't swallowing" and stated she feels like "the doctors are giving up but I am not going to." This RN reiterated much of what has been said in previous notes concerning PEG tube, DVT, swallowing, CVA, etc.However, the daughter wishes to speak with the oncologist and a nutritionist about other possible options.
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
## 2015-09-21 NOTE — Progress Notes (Signed)
Initial Nutrition Assessment  DOCUMENTATION CODES:   Severe malnutrition in context of acute illness/injury  INTERVENTION:  Ensure Enlive po BID, each supplement provides 350 kcal and 20 grams of protein. Thicken to nectar-thick.  Recommend updating diet order to reflect nectar-thick liquids as recommended by SLP evaluation at last admission.  RD will continue to follow.  NUTRITION DIAGNOSIS:   Malnutrition (Severe) related to acute illness (CVA, metastatic breast cancer) as evidenced by energy intake < or equal to 50% for > or equal to 1 month, mild depletion of muscle mass, 20 percent weight loss over 3 months.  GOAL:   Patient will meet greater than or equal to 90% of their needs  MONITOR:   PO intake, Supplement acceptance (Goals of care discussion with family)  REASON FOR ASSESSMENT:   Malnutrition Screening Tool    ASSESSMENT:   82 y.o. Female with hx of HTN, breast cancer (diagnosed 2006, underwent mastectomy, was on exemestane), acute CVA 03/2015 and extension 03/2015. Previous admission 06/2015 daughter agreed to comfort care and pt d/c on hospice, readmitted 07/2015 with dehydration, UTI, probable aspiration with dysphagia. She now presents with AMS for 2-3 days and distended abdomen. CT abdomen showed a R femoral DVT. CT also revealed metastatic disease at L4, iliac bones bilat, intra-abd nodes, 10th rib on R side.    Patient's daughter is requesting tube feeding so GI has been consulted. As patient has already been previously discharged on hospice, I would not recommend feeding tube placement at this time.   Patient unable to answer questions at time of assessment - daughter present to provide information. She reports the patient has had a hard time keeping food down for the past 2 months and vomits approximately 2x/day. Daughter is unsure of UBW but has noticed weight loss. Per chart, UBW appears to be around 178 lbs. Patient has lost 16.5 kg (20% body weight) over 3  months (since 5/24).   Meal Completion: 50% per daughter report and recorded in chart  Medications reviewed and include: potassium chloride 10 mEq four times today, D5-1/2NS + KCl 68mEq/L @ 125 ml/hr.  Labs reviewed: Potassium 2.9, magnesium 1.6.  Nutrition-Focused physical exam completed. Findings are no fat depletion, mild muscle depletion, and no edema. Some mild muscle depletion in temple region. Difficult to assess as patient is overweight.   Discussed plan with RN. Patient has been delivered nectar-thick liquids even though diet order does not reflect this.  Diet Order:  DIET SOFT Room service appropriate? Yes; Fluid consistency: Thin  Skin:  Wound (see comment) (Stage 3 pressure ulcer sacrum)  Last BM:  09/14/2015  Height:   Ht Readings from Last 1 Encounters:  09/21/15 5' (1.524 m)    Weight:   Wt Readings from Last 1 Encounters:  09/21/15 141 lb 8.6 oz (64.2 kg)    Ideal Body Weight:     BMI:  Body mass index is 27.64 kg/m.  Estimated Nutritional Needs:   Kcal:  1900-2200 (30-35 kcal/kg)  Protein:  80-95 grams (1.25-1.5 grams/kg)  Fluid:  >/= 1.9 L/day  EDUCATION NEEDS:   Education needs addressed (Reinforced high-calorie and protein education given during last admission.)  Willey Blade, MS, RD, LDN
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
## 2015-09-21 NOTE — Progress Notes (Signed)
[facility]  Patient Status: Active (receiving services up to time of hospitalization)  [facility] is providing the following services: RN and HHA  If patient discharges after hours, please call [phone].   Rose Sullivan 09/21/2015, 9:58 AM

## 2015-09-21 NOTE — Care Management Note (Signed)
Case Management Note  Patient Details  Name: EDITHE ANSCHUTZ MRN: [id number] Date of Birth: 1933-04-19  Subjective/Objective:82 y/o f admitted w/Acute R leg DVT, FTT. Hx:DNR, r coccyx wound. From home w/[facility]-Active w/[facility] rep Santiago Glad following. Noted palliative cons.CM cons for benefit check enoxaparin-will await dosge,freq,duration then will check benefit.                   Action/Plan:d/c plan home.   Expected Discharge Date:                  Expected Discharge Plan:  [place]  In-House Referral:     Discharge planning Services  CM Consult  Post Acute Care Choice:  Home Health (Active w/[facility]-HHRN/aide) Choice offered to:     DME Arranged:    DME Agency:     HH Arranged:    HH Agency:     Status of Service:  In process, will continue to follow  If discussed at Long Length of Stay Meetings, dates discussed:    Additional Comments:  Dessa Phi, RN 09/21/2015, 3:48 PM

## 2015-09-21 NOTE — Progress Notes (Signed)
Palliative Care consult received and I reviewed her chart in detail. We did a consult on this patient 07/16/2015 and patient was discharged on hospice care per the discharge summary-[facility]? Per CM note. From the documentation this admission, they are not wanting hospice or palliative care involved- this needs to be explored by a trusted provider before we engage with this patient and family-I will reach out to [facility] for additional details. See consultation note dated [id number] by Vinie Sill. NP. This patient's debility is profound and she is very likely approaching end of life. Appreciate GI's thoughtful approach in explaining to this family that a feeding tube is not medically indicated or an appropriate intervention. DNR on file. Recommend Chaplain consult for support. Provide gentle reassurance that her poor PO intake, level of responsiveness are all related to the dying process-the DVT is related to irreversible cancer and her widespread metastasis are likely causing pain and discomfort that needs to be treated.  We are happy to assist with this case if family agreeable and the prior situation with hospice is explored- we want our encounter to be therapeutic and helpful, but if there are unresolved issues from other hospice and palliative care providers we need to know those first. Will contact attending provider in AM to discuss further.  Lane Hacker, DO Palliative Medicine

## 2015-09-21 NOTE — Consult Note (Signed)
UNASSIGNED PATIENT Reason for Consult: Feeding difficulties with failure to thrive Referring Physician: [facility] Rose Sullivan is an 82 y.o. female.  HPI: Rose Sullivan is a year-old white female with multiple medical medical problems listed below who was transferred to the [facility] today by EMS for possible feeding tube placement.. On speaking with her daughter in GI realized the patient was scheduled to have a outpatient office appointment with Dr. Carol Ada; when Dr. Benson Norway reviewed her records and realized the degree of her disability daily advised the family to come to the hospital. On evaluation at admission,  the patient was found to have a DVT in her right leg and was started on anticoagulation. GI consultation was requested. I spoke to the tried hospitalist at length about the patient who requested that I speak to the patient's Artane G who is at the patient's bedside. Patient's daughter insisted that the PEG tube needs to be placed as the patient wants feeling through the PEG. She cleaned him otherwise awake and alert and these are her desires. On evaluate the patient in the hospital the patient was moaning in pain and not responsive and communicative she had another daughter at the bedside. I was not able to get any history from the patient whatsoever. Most of the history was procured by reviewing her records. As per the patient's daughter she was having some problems with swallowing and recurrent nausea with vomiting. Patient can the patient's family could not quantify any weight loss.   Past Medical History:  Diagnosis Date  . Anxiety state, unspecified 02/04/2013  . Atrial flutter ([place])    a. s/p remote ablation.  Marland Kitchen BACK PAIN 12/10/2007  . BLINDNESS, [place], [place] DEFINITION 11/25/2006  . BREAST CANCER, HX OF 11/25/2006  . CKD (chronic kidney disease), stage II    CKD II-III per labs  . GAIT DISTURBANCE 12/10/2007  . GERD 02/11/2007  . HYPERLIPIDEMIA   . Hypersomnia  05/18/2010  . HYPERTENSION   . Impaired glucose tolerance    a. hx of diet controlled DM per chart.  . LOW BACK PAIN, CHRONIC 11/25/2006  . Nocturia 05/18/2010  . Obesity   . OSTEOPENIA 02/11/2007  . PAF (paroxysmal atrial fibrillation) ([place])    a. started on amio 05/2015 by IM.  Marland Kitchen Stroke [facility])    a. Stroke 03/2015 with readmission 03/2015 for extension of stroke felt 2/2 hypotension from UTI.  b. Adm 05/2015 for encephalopathy encephalopathy secondary to left occipital stroke extension (not afib cardioembolic pattern, more likely related to hypotension from dehydration/UTI).  Marland Kitchen SVT (supraventricular tachycardia) (HCC)     Past Surgical History:  Procedure Laterality Date  . APPENDECTOMY    . CATARACT EXTRACTION    . DILATION AND CURETTAGE OF UTERUS    . KNEE SURGERY Right   . s/p Mastectomy Right     Family History  Problem Relation Age of Onset  . Stroke Mother   . Colon cancer Father   . Hypertension Brother   . Lung cancer Brother   . COPD Brother     Social History:  reports that she has never smoked. She has never used smokeless tobacco. She reports that she does not drink alcohol or use drugs.  Allergies:  Allergies  Allergen Reactions  . Trazodone And Nefazodone Other (See Comments)    Reaction:  Hallucinations    Medications: I have reviewed the patient's current medications.  Results for orders placed or performed during the hospital encounter of 09/20/15 (from the  past 48 hour(s))  CBC with Differential     Status: None   Collection Time: 09/20/15  6:36 PM  Result Value Ref Range   WBC 8.8 4.0 - 10.5 K/uL   RBC 4.04 3.87 - 5.11 MIL/uL   Hemoglobin 12.7 12.0 - 15.0 g/dL   HCT 38.3 36.0 - 46.0 %   MCV 94.8 78.0 - 100.0 fL   MCH 31.4 26.0 - 34.0 pg   MCHC 33.2 30.0 - 36.0 g/dL   RDW 14.4 11.5 - 15.5 %   Platelets 253 150 - 400 K/uL   Neutrophils Relative % 60 %   Neutro Abs 5.2 1.7 - 7.7 K/uL   Lymphocytes Relative 23 %   Lymphs Abs 2.1 0.7 - 4.0 K/uL    Monocytes Relative 11 %   Monocytes Absolute 1.0 0.1 - 1.0 K/uL   Eosinophils Relative 6 %   Eosinophils Absolute 0.5 0.0 - 0.7 K/uL   Basophils Relative 0 %   Basophils Absolute 0.0 0.0 - 0.1 K/uL  Comprehensive metabolic panel     Status: Abnormal   Collection Time: 09/20/15  6:36 PM  Result Value Ref Range   Sodium 139 135 - 145 mmol/L   Potassium 2.6 (LL) 3.5 - 5.1 mmol/L    Comment: CRITICAL RESULT CALLED TO, READ BACK BY AND VERIFIED WITH: QUICK,M AT 1927 ON 09/20/15 BY MOSLEY,J    Chloride 102 101 - 111 mmol/L   CO2 28 22 - 32 mmol/L   Glucose, Bld 99 65 - 99 mg/dL   BUN 30 (H) 6 - 20 mg/dL   Creatinine, Ser 1.00 0.44 - 1.00 mg/dL   Calcium 9.1 8.9 - 10.3 mg/dL   Total Protein 6.7 6.5 - 8.1 g/dL   Albumin 3.2 (L) 3.5 - 5.0 g/dL   AST 37 15 - 41 U/L   ALT 21 14 - 54 U/L   Alkaline Phosphatase 68 38 - 126 U/L   Total Bilirubin 1.3 (H) 0.3 - 1.2 mg/dL   GFR calc non Af Amer 52 (L) >60 mL/min   GFR calc Af Amer >60 >60 mL/min    Comment: (NOTE) The eGFR has been calculated using the CKD EPI equation. This calculation has not been validated in all clinical situations. eGFR's persistently <60 mL/min signify possible Chronic Kidney Disease.    Anion gap 9 5 - 15  Lipase, blood     Status: None   Collection Time: 09/20/15  6:36 PM  Result Value Ref Range   Lipase 41 11 - 51 U/L  I-Stat CG4 Lactic Acid, ED     Status: Abnormal   Collection Time: 09/20/15  6:58 PM  Result Value Ref Range   Lactic Acid, Venous 2.15 (HH) 0.5 - 1.9 mmol/L   Comment NOTIFIED PHYSICIAN   Magnesium     Status: None   Collection Time: 09/20/15 10:43 PM  Result Value Ref Range   Magnesium 1.7 1.7 - 2.4 mg/dL  Protime-INR     Status: None   Collection Time: 09/21/15  2:18 AM  Result Value Ref Range   Prothrombin Time 14.5 11.4 - 15.2 seconds   INR 1.12   APTT     Status: None   Collection Time: 09/21/15  2:18 AM  Result Value Ref Range   aPTT 27 24 - 36 seconds  Basic metabolic panel      Status: Abnormal   Collection Time: 09/21/15  2:18 AM  Result Value Ref Range   Sodium 141 135 - 145 mmol/L  Potassium 2.9 (L) 3.5 - 5.1 mmol/L   Chloride 109 101 - 111 mmol/L   CO2 25 22 - 32 mmol/L   Glucose, Bld 81 65 - 99 mg/dL   BUN 22 (H) 6 - 20 mg/dL   Creatinine, Ser 0.78 0.44 - 1.00 mg/dL   Calcium 8.0 (L) 8.9 - 10.3 mg/dL   GFR calc non Af Amer >60 >60 mL/min   GFR calc Af Amer >60 >60 mL/min    Comment: (NOTE) The eGFR has been calculated using the CKD EPI equation. This calculation has not been validated in all clinical situations. eGFR's persistently <60 mL/min signify possible Chronic Kidney Disease.    Anion gap 7 5 - 15  Magnesium     Status: Abnormal   Collection Time: 09/21/15  2:18 AM  Result Value Ref Range   Magnesium 1.6 (L) 1.7 - 2.4 mg/dL   Ct Abdomen Pelvis W Contrast  Result Date: 09/20/2015 CLINICAL DATA:  Patient with altered mental status for 3 days. History of dementia. Distended abdomen. EXAM: CT ABDOMEN AND PELVIS WITH CONTRAST TECHNIQUE: Multidetector CT imaging of the abdomen and pelvis was performed using the standard protocol following bolus administration of intravenous contrast. CONTRAST:  162m ISOVUE-300 IOPAMIDOL (ISOVUE-300) INJECTION 61% COMPARISON:  CT abdomen pelvis 04/02/2007. FINDINGS: Lower chest: Normal heart size. Small bilateral pleural effusions. Subpleural ground-glass opacities within the lower lobes bilaterally. Hepatobiliary: The liver is normal in size and contour. Patient status post cholecystectomy. Intrahepatic and extrahepatic biliary ductal dilatation. Common bile duct measures up to 19 mm. Pancreas: Unremarkable Spleen: Unremarkable Adrenals/Urinary Tract: Normal adrenal glands. Kidneys enhance symmetrically with contrast. No hydronephrosis. Urinary bladder is unremarkable. Small left excreted contrast dependently within the urinary bladder. Stomach/Bowel: Sigmoid colonic diverticulosis. No CT evidence for acute  diverticulitis. No abnormal bowel wall thickening or evidence for bowel obstruction. No free fluid or free intraperitoneal air. Vascular/Lymphatic: Peripheral calcified atherosclerotic plaque involving the abdominal aorta. Prominent upper abdominal and porta hepatic lymph nodes measuring up to 16 mm (image 23; series 2). Filling defect demonstrated within the right common femoral vein (image 69; series 5). Other: Uterus and adnexal structures unremarkable. Small cystic structure within the right adnexa. Musculoskeletal: There is a 1.9 cm sclerotic lesion within the right ilium (image 53; series 2). There is a 0.8 cm sclerotic lesion within the left ilium (image 58; series 2). 1.7 cm sclerotic lesion within the L4 vertebral body. Lower thoracic and lumbar spine degenerative changes. Osseous destruction of the posterior right tenth rib with associated 1.9 cm soft tissue mass (image 15; series 2). 140 cm soft tissue mass overlying the lower right hemi thorax (image 25; series 2). IMPRESSION: Filling defect demonstrated within the right common femoral vein most suggestive of deep venous thrombosis. Marked dilatation of the common bile duct measuring up to 19 mm. While this may potentially be physiologic in the setting of prior cholecystectomy, obstruction not excluded. Recommend laboratory correlation. Multiple prominent mildly enlarged upper abdominal lymph nodes raising the possibility of metastatic adenopathy. Sclerotic osseous metastasis involving the right and left ilium and L4 vertebral body. Osseous metastasis involving the posterior right tenth rib with associated soft tissue mass. Small bilateral pleural effusions. These results were called by telephone at the time of interpretation on 09/20/2015 at 8:28 pm to Dr. KJanetta Hora, who verbally acknowledged these results. Electronically Signed   By: DLovey NewcomerM.D.   On: 09/20/2015 20:31   Dg Chest Port 1 View  Result Date: 09/20/2015 CLINICAL DATA:  Cough,  abdominal pain and vomiting EXAM: PORTABLE CHEST 1 VIEW COMPARISON:  Chest radiograph 08/11/2015 FINDINGS: Cardiomediastinal contours are unchanged with persistent prominence of the upper mediastinum. There are diffusely increased bilateral lung markings without focal airspace consolidation. No pulmonary edema. No pneumothorax or sizable pleural effusion. Sclerotic lesions within the proximal humeri bilaterally with good are unchanged. IMPRESSION: No radiographic evidence of pneumonia. Electronically Signed   By: Ulyses Jarred M.D.   On: 09/20/2015 22:19   Review of Systems  Unable to perform ROS: Mental status change   Blood pressure (!) 159/87, pulse 72, temperature 97.8 F (36.6 C), temperature source Axillary, resp. rate 18, height 5' (1.524 m), weight 64.2 kg (141 lb 8.6 oz), SpO2 96 %. Physical Exam  Constitutional: She appears well-developed and well-nourished.  HENT:  Head: Normocephalic and atraumatic.  Neck: Neck supple.  Cardiovascular: Normal rate and regular rhythm.   Respiratory: She is in respiratory distress.  GI: She exhibits distension. She exhibits no mass. There is tenderness. There is no rebound.  Skin: Skin is warm and dry.  Psychiatric: She is agitated, slowed and withdrawn. Cognition and memory are impaired. She is noncommunicative.  Confused, moaning, unable to communicate   Assessment/Plan: 1) Feeding difficulties with altered mental status/severe malnutrition: in light of the patient's recent diagnosis of DVT requiring anticoagulation and other comorbidities,  the patient is not a candidate for PEG placement at this time. I have had an extensive discussion with her daughter Rose Sullivan, who seems to understand our limitations.   2) Dysphagia probably secondary to strokes. 3) Metastatic breast cancer-palliative care consult him seems to be in order at this time. 4) History of paroxysmal atrial fibrillation.  5) Stage III decubitus ulcer.  6) Right leg DVT diagnosed on CT  scan on admission requiring anticoagulation.  7) Legally blind.  8) DO NOT RESUSCITATE  Nemiah Kissner 09/21/2015, 11:30 AM

## 2015-09-21 NOTE — Consult Note (Signed)
[place] Nurse wound consult note Reason for Consult: Stage 3 pressure injury to coccyx.  Moisture Associated Skin damage to bilateral upper buttocks.  Erythema and maceration noted today. Incontinent of bowel and bladder Wound type:Pressure and moisture Pressure Ulcer POA: Yes Measurement:Coccyx 2 cm x 2 cm x 0.2 cm  Bilateral upper buttocks 1.5 cm x 1 cm intact erythema Wound [id number] pink nongranulating Drainage (amount, consistency, odor) minimal serosanguinous Periwound:Blanchable erythema Dressing procedure/placement/frequency:Cleanse coccyx wound with soap and water.  Barrier cream to periwound skin to protect from Incontinence.  Apply silicone border foam dressing.  Change every 3 days and PRN soilage.  Turn and reposition every two hours.  Patient is on bed with low air loss replacement.  Will not follow at this time.  Please re-consult if needed.  Domenic Moras RN BSN [place] Pager [phone]

## 2015-09-21 NOTE — Progress Notes (Signed)
ANTICOAGULATION CONSULT NOTE - Initial Consult  Pharmacy Consult for apixaban Indication: atrial fibrillation and DVT  Allergies  Allergen Reactions  . Trazodone And Nefazodone Other (See Comments)    Reaction:  Hallucinations     Patient Measurements: Height: 5' (152.4 cm) Weight: 141 lb 8.6 oz (64.2 kg) IBW/kg (Calculated) : 45.5 Heparin Dosing Weight:   Vital Signs: Temp: 97.8 F (36.6 C) (08/23 0016) Temp Source: Axillary (08/23 0016) BP: 159/87 (08/23 0122) Pulse Rate: 72 (08/23 0122)  Labs:  Recent Labs  09/20/15 1836 09/21/15 0218  HGB 12.7  --   HCT 38.3  --   PLT 253  --   APTT  --  27  LABPROT  --  14.5  INR  --  1.12  CREATININE 1.00  --     Estimated Creatinine Clearance: 37.5 mL/min (by C-G formula based on SCr of 1 mg/dL).   Medical History: Past Medical History:  Diagnosis Date  . Anxiety state, unspecified 02/04/2013  . Atrial flutter ([place])    a. s/p remote ablation.  Marland Kitchen BACK PAIN 12/10/2007  . BLINDNESS, [place], [place] DEFINITION 11/25/2006  . BREAST CANCER, HX OF 11/25/2006  . CKD (chronic kidney disease), stage II    CKD II-III per labs  . GAIT DISTURBANCE 12/10/2007  . GERD 02/11/2007  . HYPERLIPIDEMIA   . Hypersomnia 05/18/2010  . HYPERTENSION   . Impaired glucose tolerance    a. hx of diet controlled DM per chart.  . LOW BACK PAIN, CHRONIC 11/25/2006  . Nocturia 05/18/2010  . Obesity   . OSTEOPENIA 02/11/2007  . PAF (paroxysmal atrial fibrillation) ([place])    a. started on amio 05/2015 by IM.  Marland Kitchen Stroke [facility])    a. Stroke 03/2015 with readmission 03/2015 for extension of stroke felt 2/2 hypotension from UTI.  b. Adm 05/2015 for encephalopathy encephalopathy secondary to left occipital stroke extension (not afib cardioembolic pattern, more likely related to hypotension from dehydration/UTI).  Marland Kitchen SVT (supraventricular tachycardia) (HCC)     Medications:  Prescriptions Prior to Admission  Medication Sig Dispense Refill Last Dose  . bisacodyl  (DULCOLAX) 10 MG suppository Place 1 suppository (10 mg total) rectally daily as needed for moderate constipation. 12 suppository 0 Past Month at Unknown time  . collagenase (SANTYL) ointment Apply 1 application topically daily.   09/20/2015 at Unknown time  . diclofenac sodium (VOLTAREN) 1 % GEL Apply 2 g topically 4 (four) times daily as needed (for leg/knee pain).   09/20/2015 at Unknown time  . HYDROcodone-acetaminophen (HYCET) 7.5-325 mg/15 ml solution Take 15 mLs by mouth 4 (four) times daily as needed for moderate pain.    09/20/2015 at 1000  . morphine (ROXANOL) 20 MG/ML concentrated solution Take 10 mg by mouth every 4 (four) hours as needed for severe pain or shortness of breath.   Past Week at Unknown time  . mupirocin ointment (BACTROBAN) 2 % Place 1 application into the nose 3 (three) times daily as needed (for sores).   Past Month at Unknown time  . ondansetron (ZOFRAN-ODT) 8 MG disintegrating tablet Take 8 mg by mouth every 8 (eight) hours as needed for nausea or vomiting.   Past Week at Unknown time  . senna-docusate (SENOKOT-S) 8.6-50 MG tablet Take 2 tablets by mouth at bedtime as needed for mild constipation. 20 tablet 0 Past Month at Unknown time   Scheduled:  . antiseptic oral rinse  7 mL Mouth Rinse q12n4p  . apixaban  10 mg Oral BID  . chlorhexidine  15 mL Mouth Rinse BID    Assessment: Patient with new DVT.  Patient was previously on apixaban per notes, MD agree to restart apixaban in place of heparin/warfarin.  Baseline anticoags WNL.  Goal of Therapy:  Safe and effective use of apixaban    Plan:  Apixaban 10mg  po BID x 7days (14 doses) Then 5mg  po BID there after.  Tyler Deis, Shea Stakes [place] 09/21/2015,4:17 AM

## 2015-09-22 DIAGNOSIS — Z853 Personal history of malignant neoplasm of breast: Secondary | ICD-10-CM

## 2015-09-22 DIAGNOSIS — R4182 Altered mental status, unspecified: Secondary | ICD-10-CM

## 2015-09-22 DIAGNOSIS — N390 Urinary tract infection, site not specified: Secondary | ICD-10-CM

## 2015-09-22 DIAGNOSIS — I82419 Acute embolism and thrombosis of unspecified femoral vein: Secondary | ICD-10-CM

## 2015-09-22 DIAGNOSIS — C7951 Secondary malignant neoplasm of bone: Secondary | ICD-10-CM

## 2015-09-22 LAB — CBC
HEMATOCRIT: 31.8 % — AB (ref 36.0–46.0)
HEMOGLOBIN: 10.6 g/dL — AB (ref 12.0–15.0)
MCH: 31.6 pg (ref 26.0–34.0)
MCHC: 33.3 g/dL (ref 30.0–36.0)
MCV: 94.9 fL (ref 78.0–100.0)
PLATELETS: 222 10*3/uL (ref 150–400)
RBC: 3.35 MIL/uL — AB (ref 3.87–5.11)
RDW: 14.9 % (ref 11.5–15.5)
WBC: 8.3 10*3/uL (ref 4.0–10.5)

## 2015-09-22 MED ORDER — PRO-STAT SUGAR FREE PO LIQD
30.0000 mL | Freq: Two times a day (BID) | ORAL | Status: DC
  Administered 2015-09-22 (×2): 30 mL via ORAL
  Filled 2015-09-22 (×5): qty 30

## 2015-09-22 MED ORDER — MORPHINE SULFATE (PF) 2 MG/ML IV SOLN
2.0000 mg | INTRAVENOUS | Status: DC | PRN
  Administered 2015-09-22 – 2015-09-24 (×6): 2 mg via INTRAVENOUS
  Administered 2015-09-24: 4 mg via INTRAVENOUS
  Administered 2015-09-24 (×4): 2 mg via INTRAVENOUS
  Administered 2015-09-24 (×2): 4 mg via INTRAVENOUS
  Administered 2015-09-24 (×2): 2 mg via INTRAVENOUS
  Administered 2015-09-24: 4 mg via INTRAVENOUS
  Administered 2015-09-24 (×4): 2 mg via INTRAVENOUS
  Administered 2015-09-25 – 2015-09-26 (×8): 4 mg via INTRAVENOUS
  Administered 2015-09-26: 2 mg via INTRAVENOUS
  Administered 2015-09-26: 4 mg via INTRAVENOUS
  Filled 2015-09-22 (×3): qty 2
  Filled 2015-09-22 (×2): qty 1
  Filled 2015-09-22: qty 2
  Filled 2015-09-22: qty 1
  Filled 2015-09-22 (×2): qty 2
  Filled 2015-09-22 (×2): qty 1
  Filled 2015-09-22 (×2): qty 2
  Filled 2015-09-22 (×4): qty 1
  Filled 2015-09-22 (×3): qty 2
  Filled 2015-09-22 (×3): qty 1
  Filled 2015-09-22: qty 2
  Filled 2015-09-22: qty 1
  Filled 2015-09-22 (×2): qty 2
  Filled 2015-09-22 (×2): qty 1
  Filled 2015-09-22: qty 2
  Filled 2015-09-22 (×3): qty 1

## 2015-09-22 NOTE — Progress Notes (Addendum)
Chaplain following for support around readmission, palliative care consult.    Spoke with daughter, Rose Sullivan, at [place] bedside.  Rose Sullivan was aware of chaplain presence, somnolent and communicates needs briefly with Rose Sullivan through slurred speech and moans.    During our conversation, Rose Sullivan stated she was open to speaking with Rose Sullivan from [place] team.  Though she Sullivan skeptical of hospice involvement, as I will detail below, chaplain framed speaking with palliative as the potential to have folks on her team who can hear her concerns about quality of life and be involved in conversation around what she can do in order to care for her mother in this stage.    Rose Sullivan describes quality of life as "mom being able to sit up, maybe eat an ice cream sandwich, talk to me a bit..." She expresses that Rose Sullivan Sullivan capable of doing this periodically and feels lack of nutrition Sullivan her main barrier to experiencing this more.  During our conversation, Rose Sullivan Sullivan giving Rose Sullivan water from a syringe.    Rose Sullivan recognizes other illnesses present (DVT, metastatic ca) but hopes to continue to advocate for her mom to experience maximum quality of life during the time she Sullivan present.   Rose Sullivan has had conversations with her mother where Rose Sullivan verbal and oriented and expressed her desire to continue to "keep going" and feels responsible for upholding this wish.    Rose Sullivan feels frustration that care team has not been able to see her mother respond in the ways she Sullivan able and feels her mother Sullivan being "painted into a corner" because "all they see Sullivan her like this."  She states "They come in, see other doctors notes, find the other illnesses, see her like this... And say 'There Sullivan nothing we can do.' but they don't get to see her how I know she can be."      Rose Sullivan Sullivan skeptical of hospice involvement. Rose Sullivan discharged with home hospice previously.  Rose Sullivan describes Rose Sullivan began to improve at home (talking, sitting  up), but feels hospice was not recognizing this.   Angela's faith figures prominently in her coping.  She states "I am not unrealistic.  I have seen faith healing, but I know that God heals in many ways and also that it can be your time to go."   She describes wanting to give her mother all the possible opportunities for healing.    Chaplain provided empathic presence, support around exhaustion, frustration, vigilance in caring for mother.  Rose Sullivan describes never marrying, recently breaking off significant relationship due to realizing partner's behaviors were indicating relationship was not right.  Speaks with chaplain about caregiver fatigue and wondering "where the other parts of my life have gone.  (She was Geophysicist/field seismologist, owned a business, Social research officer, government)  She lives at home with mother and their dogs.  Sullivan tearful in thinking about loss of mother and three elderly dogs.     Forwarded message to palliative about openness to their involvement.   Will follow up in AM for continued support prior to palliative meeting.       Jerene Pitch [place]

## 2015-09-22 NOTE — Consult Note (Signed)
[facility]  [phone] [phone]     ID: Rose Sullivan DOB: 1933/02/14  MR#: [id number]  TDD#:[id number]  Patient Care Team: Biagio Borg, MD as PCP - General PCP: [name], MD Chauncey Cruel, MD .  INTERVAL HISTORY: I last saw Rose Sullivan two years ago. (Her history of metastatic breast cancer is detailed below.) Since that time she has had significant decline including repeated strokes, and she was sent home under Hospice care after her last admission. The family was led to expect Rose Sullivan was in process of dying, but instead she improved, was able to sit up, enjoy some food, and interact with family. They entertained hopes this might last. They felt those hopes were dismissed as denial by Hospice and the result is they refused further Hospice involvement. Rose Sullivan's condition eventually declined, leading to the current admission, during which Rose Sullivan has been found to have an acute R LE DVT and some bone lesions and adenopathy suggestive of metastatic cancer. We were consulted to address this isue  REVIEW OF SYSTEMS: Rose Sullivan smiles when I introduce myself but shortly afterwards starts moaning softly. It is hard for me to tell is she is in actual pain, but she does not seem comfortable. She is unable to give me a ROS. Daughter in room  Allergies  Allergen Reactions  . Trazodone And Nefazodone Other (See Comments)    Reaction:  Hallucinations     Current Facility-Administered Medications  Medication Dose Route Frequency Provider Last Rate Last Dose  . acetaminophen (TYLENOL) tablet 650 mg  650 mg Oral Q6H PRN Roney Jaffe, MD       Or  . acetaminophen (TYLENOL) suppository 650 mg  650 mg Rectal Q6H PRN Roney Jaffe, MD      . antiseptic oral rinse (CPC / CETYLPYRIDINIUM CHLORIDE 0.05%) solution 7 mL  7 mL Mouth Rinse q12n4p Roney Jaffe, MD   7 mL at 09/21/15 1617  . apixaban (ELIQUIS) tablet 10 mg  10 mg Oral BID Roney Jaffe, MD   10 mg at  09/21/15 1942  . [START ON 11-Oct-2015] apixaban (ELIQUIS) tablet 5 mg  5 mg Oral BID Roney Jaffe, MD      . bisacodyl (DULCOLAX) suppository 10 mg  10 mg Rectal Daily PRN Roney Jaffe, MD   10 mg at 09/21/15 1019  . chlorhexidine (PERIDEX) 0.12 % solution 15 mL  15 mL Mouth Rinse BID Roney Jaffe, MD   15 mL at 09/21/15 2139  . dextrose 5 % and 0.45 % NaCl with KCl 30 mEq/L infusion   Intravenous Continuous Roney Jaffe, MD 125 mL/hr at 09/22/15 6237    . feeding supplement (ENSURE ENLIVE) (ENSURE ENLIVE) liquid 237 mL  237 mL Oral BID BM Bonnell Public, MD   237 mL at 09/21/15 1421  . morphine 2 MG/ML injection 2 mg  2 mg Intravenous Q2H PRN Jeryl Columbia, NP   2 mg at 09/22/15 0427  . ondansetron (ZOFRAN) tablet 4 mg  4 mg Oral Q6H PRN Roney Jaffe, MD       Or  . ondansetron [facility]) injection 4 mg  4 mg Intravenous Q6H PRN Roney Jaffe, MD      . RESOURCE THICKENUP CLEAR   Oral PRN Bonnell Public, MD      . senna-docusate (Senokot-S) tablet 2 tablet  2 tablet Oral QHS PRN Roney Jaffe, MD        PAST MEDICAL HISTORY: Past Medical History:  Diagnosis Date  .  Anxiety state, unspecified 02/04/2013  . Atrial flutter ([place])    a. s/p remote ablation.  Marland Kitchen BACK PAIN 12/10/2007  . BLINDNESS, [place], [place] DEFINITION 11/25/2006  . BREAST CANCER, HX OF 11/25/2006  . CKD (chronic kidney disease), stage II    CKD II-III per labs  . GAIT DISTURBANCE 12/10/2007  . GERD 02/11/2007  . HYPERLIPIDEMIA   . Hypersomnia 05/18/2010  . HYPERTENSION   . Impaired glucose tolerance    a. hx of diet controlled DM per chart.  . LOW BACK PAIN, CHRONIC 11/25/2006  . Nocturia 05/18/2010  . Obesity   . OSTEOPENIA 02/11/2007  . PAF (paroxysmal atrial fibrillation) ([place])    a. started on amio 05/2015 by IM.  Marland Kitchen Stroke [facility])    a. Stroke 03/2015 with readmission 03/2015 for extension of stroke felt 2/2 hypotension from UTI.  b. Adm 05/2015 for encephalopathy encephalopathy secondary to left  occipital stroke extension (not afib cardioembolic pattern, more likely related to hypotension from dehydration/UTI).  Marland Kitchen SVT (supraventricular tachycardia) ([place])     PAST SURGICAL HISTORY: Past Surgical History:  Procedure Laterality Date  . APPENDECTOMY    . CATARACT EXTRACTION    . DILATION AND CURETTAGE OF UTERUS    . KNEE SURGERY Right   . s/p Mastectomy Right     FAMILY HISTORY Family History  Problem Relation Age of Onset  . Stroke Mother   . Colon cancer Father   . Hypertension Brother   . Lung cancer Brother   . COPD Brother     GYNECOLOGIC HISTORY:  .She is GX, P3.  She went through change of life around age 39.  She never used hormones.  SOCIAL HISTORY:  She worked for [employer] for [employer], being legally blind from birth, although she can see light and can identify people.  She is widowed, her husband being killed in an automobile accident shortly before Christmas of 2004.  Her daughter Rose Sullivan is a free Artist.  A second daughter, Rose Sullivan, works in a school cafeteria locally.  A third daughter, Rose Sullivan, works at [employer].  The patient has one grandchild.  She has two dogs called Blasdell and two cats.  The patient lives with her daughter Rose Sullivan and Rose Sullivan says "I have taken care of her since I was 82 years old".      ADVANCED DIRECTIVES: DNR in place  HEALTH MAINTENANCE: Social History  Substance Use Topics  . Smoking status: Never Smoker  . Smokeless tobacco: Never Used     Comment: quit 40 years ago  . Alcohol use No     OBJECTIVE: elderly White woman examined in bed Vitals:   09/21/15 2134 09/22/15 0422  BP: 122/87 132/82  Pulse: 79 82  Resp: 18 18  Temp: 97 F (36.1 C) 97.8 F (36.6 C)     Body mass index is 29.58 kg/m.    ECOG FS:4 - Bedbound   Lungs no rales or rhonchi--auscultated anterolaterally Heart regular rate and rhythm Abd soft, nontender, positive bowel sounds Neuro: responds to voice but not verbally Breasts:  Deferred   LAB RESULTS:  CMP     Component Value Date/Time   NA 141 09/21/2015 0218   NA 142 04/01/2014 1519   K 2.9 (L) 09/21/2015 0218   K 4.0 04/01/2014 1519   CL 109 09/21/2015 0218   CL 107 04/29/2012 1138   CO2 25 09/21/2015 0218   CO2 24 04/01/2014 1519   GLUCOSE 81 09/21/2015 0218   GLUCOSE  97 04/01/2014 1519   GLUCOSE 173 (H) 04/29/2012 1138   BUN 22 (H) 09/21/2015 0218   BUN 23.1 04/01/2014 1519   CREATININE 0.78 09/21/2015 0218   CREATININE 1.14 (H) 06/28/2015 1607   CREATININE 1.2 (H) 04/01/2014 1519   CALCIUM 8.0 (L) 09/21/2015 0218   CALCIUM 9.3 04/01/2014 1519   PROT 6.7 09/20/2015 1836   PROT 6.7 04/01/2014 1519   ALBUMIN 3.2 (L) 09/20/2015 1836   ALBUMIN 3.6 04/01/2014 1519   AST 37 09/20/2015 1836   AST 23 04/01/2014 1519   ALT 21 09/20/2015 1836   ALT 14 04/01/2014 1519   ALKPHOS 68 09/20/2015 1836   ALKPHOS 67 04/01/2014 1519   BILITOT 1.3 (H) 09/20/2015 1836   BILITOT 0.74 04/01/2014 1519   GFRNONAA >60 09/21/2015 0218   GFRAA >60 09/21/2015 0218    Lab Results  Component Value Date   KPAFRELGTCHN 2.34 (H) 10/30/2011   LAMBDASER 1.14 10/30/2011   KAPLAMBRATIO 2.05 (H) 10/30/2011    Lab Results  Component Value Date   TOTALPROTELP 7.2 04/29/2012   ALBUMINELP 54.8 (L) 04/29/2012   A1GS 3.8 04/29/2012   A2GS 13.7 (H) 04/29/2012   BETS 5.9 04/29/2012   BETA2SER 5.9 04/29/2012   GAMS 15.9 04/29/2012   MSPIKE NOT DET 04/29/2012   SPEI * 04/29/2012    Lab Results  Component Value Date   WBC 8.3 09/22/2015   NEUTROABS 5.2 09/20/2015   HGB 10.6 (L) 09/22/2015   HCT 31.8 (L) 09/22/2015   MCV 94.9 09/22/2015   PLT 222 09/22/2015    _0 @  Lab Results  Component Value Date   LABCA2 35 05/28/2011    No components found for: LABCA125   Recent Labs Lab 09/21/15 0218  INR 1.12    Urinalysis    Component Value Date/Time   COLORURINE AMBER (A) 09/21/2015 1706   APPEARANCEUR TURBID (A) 09/21/2015 1706   LABSPEC  >1.046 (H) 09/21/2015 1706   PHURINE 5.5 09/21/2015 1706   GLUCOSEU NEGATIVE 09/21/2015 1706   GLUCOSEU NEGATIVE 06/10/2015 1631   HGBUR NEGATIVE 09/21/2015 1706   BILIRUBINUR SMALL (A) 09/21/2015 1706   BILIRUBINUR negative 06/14/2015 1403   KETONESUR NEGATIVE 09/21/2015 1706   PROTEINUR 30 (A) 09/21/2015 1706   UROBILINOGEN negative 06/14/2015 1403   UROBILINOGEN 1.0 06/10/2015 1631   NITRITE POSITIVE (A) 09/21/2015 1706   LEUKOCYTESUR SMALL (A) 09/21/2015 1706    RADIOLOGY AND OTHER STUDIES: Ct Abdomen Pelvis W Contrast  Result Date: 09/20/2015 CLINICAL DATA:  Patient with altered mental status for 3 days. History of dementia. Distended abdomen. EXAM: CT ABDOMEN AND PELVIS WITH CONTRAST TECHNIQUE: Multidetector CT imaging of the abdomen and pelvis was performed using the standard protocol following bolus administration of intravenous contrast. CONTRAST:  159m ISOVUE-300 IOPAMIDOL (ISOVUE-300) INJECTION 61% COMPARISON:  CT abdomen pelvis 04/02/2007. FINDINGS: Lower chest: Normal heart size. Small bilateral pleural effusions. Subpleural ground-glass opacities within the lower lobes bilaterally. Hepatobiliary: The liver is normal in size and contour. Patient status post cholecystectomy. Intrahepatic and extrahepatic biliary ductal dilatation. Common bile duct measures up to 19 mm. Pancreas: Unremarkable Spleen: Unremarkable Adrenals/Urinary Tract: Normal adrenal glands. Kidneys enhance symmetrically with contrast. No hydronephrosis. Urinary bladder is unremarkable. Small left excreted contrast dependently within the urinary bladder. Stomach/Bowel: Sigmoid colonic diverticulosis. No CT evidence for acute diverticulitis. No abnormal bowel wall thickening or evidence for bowel obstruction. No free fluid or free intraperitoneal air. Vascular/Lymphatic: Peripheral calcified atherosclerotic plaque involving the abdominal aorta. Prominent upper abdominal and porta hepatic lymph nodes  measuring up to 16  mm (image 23; series 2). Filling defect demonstrated within the right common femoral vein (image 69; series 5). Other: Uterus and adnexal structures unremarkable. Small cystic structure within the right adnexa. Musculoskeletal: There is a 1.9 cm sclerotic lesion within the right ilium (image 53; series 2). There is a 0.8 cm sclerotic lesion within the left ilium (image 58; series 2). 1.7 cm sclerotic lesion within the L4 vertebral body. Lower thoracic and lumbar spine degenerative changes. Osseous destruction of the posterior right tenth rib with associated 1.9 cm soft tissue mass (image 15; series 2). 140 cm soft tissue mass overlying the lower right hemi thorax (image 25; series 2). IMPRESSION: Filling defect demonstrated within the right common femoral vein most suggestive of deep venous thrombosis. Marked dilatation of the common bile duct measuring up to 19 mm. While this may potentially be physiologic in the setting of prior cholecystectomy, obstruction not excluded. Recommend laboratory correlation. Multiple prominent mildly enlarged upper abdominal lymph nodes raising the possibility of metastatic adenopathy. Sclerotic osseous metastasis involving the right and left ilium and L4 vertebral body. Osseous metastasis involving the posterior right tenth rib with associated soft tissue mass. Small bilateral pleural effusions. These results were called by telephone at the time of interpretation on 09/20/2015 at 8:28 pm to Dr. Janetta Hora , who verbally acknowledged these results. Electronically Signed   By: Lovey Newcomer M.D.   On: 09/20/2015 20:31   Dg Chest Port 1 View  Result Date: 09/20/2015 CLINICAL DATA:  Cough, abdominal pain and vomiting EXAM: PORTABLE CHEST 1 VIEW COMPARISON:  Chest radiograph 08/11/2015 FINDINGS: Cardiomediastinal contours are unchanged with persistent prominence of the upper mediastinum. There are diffusely increased bilateral lung markings without focal airspace consolidation. No  pulmonary edema. No pneumothorax or sizable pleural effusion. Sclerotic lesions within the proximal humeri bilaterally with good are unchanged. IMPRESSION: No radiographic evidence of pneumonia. Electronically Signed   By: Ulyses Jarred M.D.   On: 09/20/2015 22:19      ASSESSMENT: 82 y.o. [place] woman   (1)  status post right mastectomy in October 2006 for 4.8-cm high-grade invasive ductal carcinoma, node negative, and so pT2 pN0, stage IIA. The tumor was ER/PR positive, HER2/neu negative.  There was a second mass also ER positive, HER2/neu negative.    (2)  Evidence of metastases involving the lungs and lymph nodes noted in February 2008, but with a good response to exemestane 25 mg daily, started in February of 2008.  Continues now on exemestane at 25 mg daily.   (3)  Reported  M-Spike not confirmed by repeat labwork in May 2013  (4)  Comorbidities include obesity, legal blindness, atrial fibrillation, chronic dyspnea, and chronic lower back pain, s/p CVA x2, CKD, acute R-LE DVT, dementia   PLAN: I had a long discussion with the patient's daughter, Rose Sullivan. There has been a disconnect between what the medical and Hospice team are saying and expecting, and what the family is understanding. What I hear the family say is that they were told several weeks ago the patient was in process of dying, that she was put in the "two week" group by Hospice, and that when the patient actually improved at home the family's hopes were dismissed and labelled as denial. As a result they dismissed Hospice and when the patient again declined she had to be re-admitted.  Maylani unfortunately is declining and likely will die relatively soon, but she does not seem to me to be actively dying. The  findings of bone lesions and adenopathy may represent recurrent breast cancer, but so far as I can tell this is not contributing to her decline. We have a very elderly demented woman with multiple organ dysfunction and  key issues are pain control, nutrition, hydration, and compromised ADL   From my discussion with Angie today the family understands ultimately these processes are not reversible and they do not want intubation, life support or resuscitation. They want Rose Sullivan to be comfortable--which I do not believe she is at this point--but they also want her to be as fit as possible as long as possible. This means helping the patient eat and drink safely if possible (no ng or PEG planned), have regular BMs, sit up and interact with family. All these goals are well within Hospice's mission and Rose Sullivan is willing to accept a Hospice re-referral (though not in the "two week" subgroup).   I also think Chaplain involvement will be helpful. Will ask Palliative care to help [place] make sure patient is comfortable using the least sedating agents possible. Consider swallowing study and OT eval to try to mobilize patient is feasible. Family would like to take patient back home and they are providing 24/7 coverage.  Will follow with you  Chauncey Cruel, MD   09/22/2015 7:53 AM Medical Oncology and Hematology [facility] [address] [place], [place] [address] Tel. [phone]    Fax. [phone]
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
## 2015-09-22 NOTE — Care Management Note (Signed)
Case Management Note  Patient Details  Name: Rose Sullivan MRN: [id number] Date of Birth: Dec 07, 1933  Subjective/Objective: Spke to dtr Rose Sullivan in room about current d/c plans-dtr plans to take her home.  I mentioned the current home care services-[facility]-dtr says yes they will continue.  The dtr wanted to know about nutrition @ home-the RD(see note) is working with dtr on the transition nutrition for home. Noted palliative cons.Patient will need ambulance transp @ d/c.                    Action/Plan:d/c plan home w/HHC.   Expected Discharge Date:                  Expected Discharge Plan:  [place]  In-House Referral:     Discharge planning Services  CM Consult  Post Acute Care Choice:  Home Health (Active w/[facility]-HHRN/aide) Choice offered to:     DME Arranged:    DME Agency:     HH Arranged:    HH Agency:     Status of Service:  In process, will continue to follow  If discussed at Long Length of Stay Meetings, dates discussed:    Additional Comments:  Dessa Phi, RN 09/22/2015, 1:10 PM

## 2015-09-22 NOTE — Consult Note (Signed)
   [facility] Inpatient Consult   09/22/2015  DENIELLE COMEGYS 26-Feb-1933 [id number]    Patient screened for [facility] Care Management program. Chart reviewed. Noted Palliative Medicine notes as well as Oncologist notes. Spoke with inpatient RNCM to discuss potential [facility] Care Management services. Will engage for [place] Management if appropriate. Asked inpatient RNCM to make [place] Management referral if needed.   Marthenia Rolling, MSN-Ed, RN,BSN [facility] Liaison [phone]

## 2015-09-22 NOTE — Progress Notes (Signed)
NUTRITION NOTE  RN alerted RD that family in pt's room had nutrition related questions. Pt and family seen by a different RD yesterday. Chart reviewed. Palliative Care saw pt yesterday with note stating that "she is very likely approaching end of life."  Upon entering the room and introducing self to pt, pt's daughter states that she understands that pt is unable to have a feeding tube but wants to optimize her PO nutrition. She states that pt is unable to tolerate anything creamy but is able to take very small sips of thickened Boost Breeze and thickened cranberry juice provided by spoon. Daughter also reports that pt has been vomiting nearly everything up. She asks about items that can be added to cranberry juice to add protein and vitamins, minerals. Informed her that Prostat may be added to thickened juice to add protein but beyond this she will need to discuss with MD.   Daughter appears unrealistic in goals for pt, asking RD about recommendations for once pt is able to tolerate creamier consistencies and for when pt goes home. Redirected daughter to focus more on current happenings than her long-term goals.   Will order Prostat BID per daughter's request but order not intended for overall improvement of nutritional status.  Pt unable to partake in visit and was moaning throughout RD visit; pt was unable to nod or shake head when she was asked questions by her daughter.    Jarome Matin, MS, RD, LDN Inpatient Clinical Dietitian Pager # [phone] After hours/weekend pager # [phone]

## 2015-09-23 DIAGNOSIS — L8990 Pressure ulcer of unspecified site, unspecified stage: Secondary | ICD-10-CM

## 2015-09-23 DIAGNOSIS — C799 Secondary malignant neoplasm of unspecified site: Secondary | ICD-10-CM

## 2015-09-23 DIAGNOSIS — C50919 Malignant neoplasm of unspecified site of unspecified female breast: Secondary | ICD-10-CM

## 2015-09-23 DIAGNOSIS — I480 Paroxysmal atrial fibrillation: Secondary | ICD-10-CM

## 2015-09-23 LAB — RENAL FUNCTION PANEL
Albumin: 2.6 g/dL — ABNORMAL LOW (ref 3.5–5.0)
Anion gap: 6 (ref 5–15)
BUN: 13 mg/dL (ref 6–20)
CO2: 20 mmol/L — ABNORMAL LOW (ref 22–32)
Calcium: 7.9 mg/dL — ABNORMAL LOW (ref 8.9–10.3)
Chloride: 109 mmol/L (ref 101–111)
Creatinine, Ser: 0.68 mg/dL (ref 0.44–1.00)
GFR calc Af Amer: >60 mL/min (ref >60)
GFR calc non Af Amer: >60 mL/min (ref >60)
Glucose, Bld: 147 mg/dL — ABNORMAL HIGH (ref 65–99)
Phosphorus: 1 mg/dL — CL (ref 2.5–4.6)
Potassium: 5.5 mmol/L — ABNORMAL HIGH (ref 3.5–5.1)
Sodium: 135 mmol/L (ref 135–145)

## 2015-09-23 LAB — CBC
HCT: 36.5 % (ref 36.0–46.0)
HEMOGLOBIN: 12 g/dL (ref 12.0–15.0)
MCH: 31.4 pg (ref 26.0–34.0)
MCHC: 32.9 g/dL (ref 30.0–36.0)
MCV: 95.5 fL (ref 78.0–100.0)
Platelets: 227 10*3/uL (ref 150–400)
RBC: 3.82 MIL/uL — AB (ref 3.87–5.11)
RDW: 15 % (ref 11.5–15.5)
WBC: 8.3 10*3/uL (ref 4.0–10.5)

## 2015-09-23 MED ORDER — SODIUM PHOSPHATES 45 MMOLE/15ML IV SOLN
15.0000 mmol | Freq: Once | INTRAVENOUS | Status: DC
  Administered 2015-09-23: 15 mmol via INTRAVENOUS
  Filled 2015-09-23: qty 5

## 2015-09-23 MED ORDER — FUROSEMIDE 10 MG/ML IJ SOLN
40.0000 mg | Freq: Two times a day (BID) | INTRAMUSCULAR | Status: AC
  Administered 2015-09-23 – 2015-09-24 (×2): 40 mg via INTRAVENOUS
  Filled 2015-09-23 (×3): qty 4

## 2015-09-23 MED ORDER — MORPHINE SULFATE (CONCENTRATE) 10 MG/0.5ML PO SOLN
20.0000 mg | ORAL | Status: DC | PRN
  Administered 2015-09-26: 20 mg via SUBLINGUAL
  Filled 2015-09-23 (×2): qty 1

## 2015-09-23 MED ORDER — DEXTROSE 5 % IV SOLN
1.0000 g | INTRAVENOUS | Status: DC
  Administered 2015-09-23 – 2015-09-26 (×4): 1 g via INTRAVENOUS
  Filled 2015-09-23 (×4): qty 10

## 2015-09-23 MED ORDER — FENTANYL 25 MCG/HR TD PT72
25.0000 ug | MEDICATED_PATCH | TRANSDERMAL | Status: DC
  Administered 2015-09-23 – 2015-09-25 (×2): 25 ug via TRANSDERMAL
  Filled 2015-09-23 (×2): qty 1

## 2015-09-23 MED ORDER — DIPHENHYDRAMINE-ZINC ACETATE 2-0.1 % EX CREA
TOPICAL_CREAM | Freq: Two times a day (BID) | CUTANEOUS | Status: DC | PRN
  Filled 2015-09-23: qty 28

## 2015-09-23 MED ORDER — DICLOFENAC SODIUM 1 % TD GEL
2.0000 g | Freq: Four times a day (QID) | TRANSDERMAL | Status: DC
  Administered 2015-09-24 – 2015-09-26 (×6): 2 g via TOPICAL
  Filled 2015-09-23: qty 100

## 2015-09-23 MED ORDER — ACETAMINOPHEN 10 MG/ML IV SOLN
1000.0000 mg | Freq: Four times a day (QID) | INTRAVENOUS | Status: AC
  Administered 2015-09-23 – 2015-09-24 (×4): 1000 mg via INTRAVENOUS
  Filled 2015-09-23 (×4): qty 100

## 2015-09-23 MED ORDER — ZOLEDRONIC ACID 4 MG/5ML IV CONC
4.0000 mg | Freq: Once | INTRAVENOUS | Status: AC
  Administered 2015-09-23: 4 mg via INTRAVENOUS
  Filled 2015-09-23: qty 5

## 2015-09-23 MED ORDER — DEXTROSE-NACL 5-0.45 % IV SOLN
INTRAVENOUS | Status: DC
  Administered 2015-09-23: 13:00:00 via INTRAVENOUS

## 2015-09-23 MED ORDER — METOCLOPRAMIDE HCL 5 MG/ML IJ SOLN
2.5000 mg | Freq: Three times a day (TID) | INTRAMUSCULAR | Status: DC
  Administered 2015-09-23 – 2015-09-26 (×10): 2.5 mg via INTRAVENOUS
  Filled 2015-09-23 (×10): qty 2

## 2015-09-23 MED ORDER — DEXAMETHASONE SODIUM PHOSPHATE 4 MG/ML IJ SOLN
4.0000 mg | Freq: Two times a day (BID) | INTRAMUSCULAR | Status: DC
  Administered 2015-09-23 – 2015-09-26 (×7): 4 mg via INTRAVENOUS
  Filled 2015-09-23 (×8): qty 1

## 2015-09-23 NOTE — Progress Notes (Signed)
ANTICOAGULATION CONSULT NOTE - Follow up [place] for apixaban Indication: atrial fibrillation and DVT  Allergies  Allergen Reactions  . Trazodone And Nefazodone Other (See Comments)    Reaction:  Hallucinations     Patient Measurements: Height: 5' (152.4 cm) Weight: 166 lb 3.6 oz (75.4 kg) IBW/kg (Calculated) : 45.5  Vital Signs: Temp: 97.8 F (36.6 C) (08/25 0611) Temp Source: Axillary (08/25 0611) BP: 149/95 (08/25 0611) Pulse Rate: 103 (08/25 0611)  Labs:  Recent Labs  09/20/15 1836 09/21/15 0218 09/22/15 0555 09/23/15 0534  HGB 12.7  --  10.6* 12.0  HCT 38.3  --  31.8* 36.5  PLT 253  --  222 227  APTT  --  27  --   --   LABPROT  --  14.5  --   --   INR  --  1.12  --   --   CREATININE 1.00 0.78  --   --     Estimated Creatinine Clearance: 50.9 mL/min (by C-G formula based on SCr of 0.8 mg/dL).   Medications:  Scheduled:  . antiseptic oral rinse  7 mL Mouth Rinse q12n4p  . apixaban  10 mg Oral BID  . [START ON 13-Oct-2015] apixaban  5 mg Oral BID  . cefTRIAXone (ROCEPHIN)  IV  1 g Intravenous Q24H  . chlorhexidine  15 mL Mouth Rinse BID  . feeding supplement (PRO-STAT SUGAR FREE 64)  30 mL Oral BID    Assessment: 77 yoF presented to ED on 8/22 with AMS and is found to have new DVT.  Patient was previously on apixaban for hx of Afib and CVA, but had stopped in June 2017 after transitioning to Hospice.  Pharmacy is consulted to re-start apixaban dosing for DVT, AFib.  Today, 09/23/2015:  SCr stable, last SCr 0.78 with CrCl ~ 50 ml/min (8/23)  CBC: remains WNL, Hgb 12, Plt 227   Patient does not meet dose reduction criteria.  (Dose reduce if pt meets 2/3 criteria: SCr  ?1.5 mg/dL, age ?80 years, body weight ?60 kg)  Goal of Therapy:  Safe and effective use of apixaban   Plan:   Apixaban 10mg  po BID x 7days (14 doses), then reduce to 5mg  po BID there after.  Dosage remains stable and need for further dosage adjustment appears  unlikely at present.  Pharmacy will sign off at this time, but will monitor peripherally.  Please reconsult if a change in clinical status warrants re-evaluation of dosage.   Gretta Arab PharmD, BCPS Pager [phone] 09/23/2015 8:35 AM

## 2015-09-23 NOTE — Progress Notes (Signed)
Rose Sullivan has previously been a [place] patient with [place]. Her episode of care with [place] has terminated during her hospitalization. Dr. Jana Hakim has recommended Hospice and we agree with this recommendation. I have discussed this with Angie, Rose Sullivan's daughter. She expressed understanding.  Edwinna Areola, RN [facility]

## 2015-09-23 NOTE — Care Management Note (Signed)
Referral for home hospice choice-Provided dtr w/home hospice provider list-[facility] chosen by dtr-Angie. pcp-Dr.Julie Barr-makes home visits, DME needed:hospital bed,overlay gel mattress,hoyer lift,overbed table. Ambulance transport needed. [facility] to contact Dr. Hilma Favors for update. Will await assessment, & if able to accept.

## 2015-09-23 NOTE — Progress Notes (Signed)
Chaplain providing continued emotional and spiritual support with Rose Sullivan prior to conference with palliative.  Present with Rose Sullivan throughout goals of care with palliative team.     Rose Sullivan, Rose Sullivan
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
## 2015-09-23 NOTE — Consult Note (Signed)
[name] is an 82 yo woman with recurrence of breast cancer presenting as widespread metastatic disease as well as multiple stroke and overall failure to thrive. She is cared for by her daughter [name], who is a devoted caregiver and advocate for her- it is very likely that codependency has complicated her illness trajectory and decision making- the patient's husband was killed in 1965 by a drunk driver and at the time [name] was 80 years old and had to become the caregiver for not only her mother but her siblings as well- there are deep psychological issues and unresolved grief from the past coming into play as Ms.Lindahl approaches EOL. She has had a significant functional decline since her stroke- mostly bedbound at baseline even before discovering the metastatic breast cancer but could talk, interact and experience joy in the company of her daughter and in her own home. Angie has been told on multiple occasions that her mom was going to die and after having UTI's treated her mother would transiently improve. She was previously on [facility]) but felt the cut things off too abruptly and were actually hastening her mothers dying process so she resended the benefit.  1.Metastatic Breast Cancer- large adenopathy in abdomen, chest, bone lesions including large lesion in her humerus, lesions in her pelvis, lesions in her lumbar spine, destruction of her 10th rib, iliac crests  Her prognosis is poor, she is most likely in the process of actively dying, but this should not prevent [place] from try to help with her symptoms and reversible illness.Goal id QOL not Length of life- but if the following interventions get her length of life the daughter wants that.  I went over CT with her daughter - this was very helpful- and completely changed her mind about goals of care- now comfort focused.   2. Cancer related pain, she is currently very under-dosed in terms of pain meds- she took 3-4 tabs of 10mg  oxycodone  at home prior to admission  Start Duragesic patch at 4mcg, use roxanol 20mg  q1 prn pain  Tylenol IV q6 for 24 hours  Zometa X1  Decadron 4mg  IV BID- transition to concentrated oral on discharge  3. Late Effect Stroke, underlying issues with dysphagia, but now asking for food but "vomits the food" and difficulty with secretions.  4. Volume overload, clinically looks overloaded and with albumin of 2.6 thrid spacing will cause discomfort and further problems  Give Lasix 40mg  X2, place a foley  Too much volume will make her have a difficult to control secretions at EOL  5. UTI  Treat with Rochephin X 3 doses, get last dose tomorrow prior to discharge  Can consider prophylaxis if her condition stabilizes  6. Vomiting- likely from intraabdominal metastatic disease- will start her on low dose scheduled reglan, decadron will help  Other Goals of Care:  1. Daughter needs a lot of information, reassurance and support, I found her to be thoughtful and after going through the medical information clearly and slowly she was extremely reasonable and not in denial. She wants to be a partner in care and decision making- she is also struggling with her own issues in letting go- her mother is her world and only support emotionally, financially and spiritually.  2. Daughter wants her mother to be at home for EOL, not in a hospital  3. Treat reversible illness with minimally invasive approaches  4. NO PEG, this was easy to clear up once we figured out what the real issues  was- she had guilt from prior hospitalization and needed reassurance from doctor that this would not have impacted her outcome or cancer progression- in fact it may have made things much worse. Comfort feeding- if we can get the vomiting and nausea better and treat the UTI she in fact be able to to take some PO- I do not think this is an unrealistic symptom management goal.  Disposition: They want to go home tomorrow with [facility], they requested to use a different hospice provider. She is hoping for a more supportive provider experience and partnership= she acknowledge she is in a different place than before.  Time: 11-1:30pm 150 minutes total Greater than 50%  of this time was spent counseling and coordinating care related to the above assessment and plan.  Lane Hacker, DO Palliative Medicine [phone]

## 2015-09-23 NOTE — Care Management Note (Signed)
Spoke to Rose Sullivan liason-D[facility]-will be able to accept patient on Monday.Per Dr. Hilma Favors patient to receive iv abx, & may either Sunday, or Monday. Ambulance transp needed for d/c.

## 2015-09-23 NOTE — Progress Notes (Signed)
MEDICATION RELATED CONSULT NOTE - INITIAL   Pharmacy Consult for Phosphorus repletion Indication: low phosphorus  Allergies  Allergen Reactions  . Trazodone And Nefazodone Other (See Comments)    Reaction:  Hallucinations     Patient Measurements: Height: 5' (152.4 cm) Weight: 166 lb 3.6 oz (75.4 kg) IBW/kg (Calculated) : 45.5  Vital Signs: Temp: 97.8 F (36.6 C) (08/25 0611) Temp Source: Axillary (08/25 0611) BP: 149/95 (08/25 0611) Pulse Rate: 103 (08/25 0611) Intake/Output from previous day: 08/24 0701 - 08/25 0700 In: 20 [P.O.:20] Out: 3 [Emesis/NG output:1; Stool:2]  Labs:  Recent Labs  09/20/15 1836 09/20/15 2243 09/21/15 0218 09/22/15 0555 09/23/15 0534 09/23/15 1001  WBC 8.8  --   --  8.3 8.3  --   HGB 12.7  --   --  10.6* 12.0  --   HCT 38.3  --   --  31.8* 36.5  --   PLT 253  --   --  222 227  --   APTT  --   --  27  --   --   --   CREATININE 1.00  --  0.78  --   --  0.68  MG  --  1.7 1.6*  --   --   --   PHOS  --   --   --   --   --  1.0*  ALBUMIN 3.2*  --   --   --   --  2.6*  PROT 6.7  --   --   --   --   --   AST 37  --   --   --   --   --   ALT 21  --   --   --   --   --   ALKPHOS 68  --   --   --   --   --   BILITOT 1.3*  --   --   --   --   --    Estimated Creatinine Clearance: 50.9 mL/min (by C-G formula based on SCr of 0.8 mg/dL).  Medications:  Scheduled:  . antiseptic oral rinse  7 mL Mouth Rinse q12n4p  . apixaban  10 mg Oral BID  . [START ON October 08, 2015] apixaban  5 mg Oral BID  . cefTRIAXone (ROCEPHIN)  IV  1 g Intravenous Q24H  . chlorhexidine  15 mL Mouth Rinse BID  . feeding supplement (PRO-STAT SUGAR FREE 64)  30 mL Oral BID   Infusions:  . dexrose 5 % and 0.45 % NaCl with KCl 30 mEq/L 125 mL/hr at 09/23/15 0840    Assessment: 34 yoF presented to ED on 8/22 with AMS and DVT.  PMH includes metastatic breast cancer, CVA, debility (bedbound), malnutrition, and is currently having aspiration and vomiting.  Pharmacy is consulted  to dose IV phosphorus replacement  Phos 1 Na 135 K 5.5 (elevates s/p IV and PO KCl replacement on 8/23, and KCl in IVF) SCr 0.68  Goal of Therapy:  Phosphorus 2.5 - 4.6 mg/dL  Plan:  NaPhos 15 mmol IV x1 dose Recheck phosphorus level in AM. Pharmacy will sign off.  Gretta Arab PharmD, BCPS Pager [phone] 09/23/2015 11:29 AM

## 2015-09-23 NOTE — Progress Notes (Signed)
CRITICAL VALUE ALERT  Critical value received:  Phosphorous 1.0  Date of notification: 09/23/15  Time of notification:  1108  Critical value read back: yes  Nurse who received alert:  Harvest Dark RN  MD notified (1st page):  Dr. Marthenia Rolling  Time of first page:  1109  Responding MD:  Dr. Marthenia Rolling  Time MD responded:  1110

## 2015-09-23 NOTE — Progress Notes (Signed)
Patient is either spitting up or vomiting almost all PO intake anywhere from 10 - 30 minutes after swallowing. This RN attempted to reinforce to the daughter that this is the nature of the progression of the disease and the goal should be do what will make her mother most comfortable, however, the daughter continues to encourage PO intake believing that "mama still has a lot of fight left" citing the previous hospice encounter, where she was moved off hospice because "her cancer had gone away." Daughter continues to ask questions about nutritional supplements, including possibly adding multivitamins into her regimen.

## 2015-09-24 DIAGNOSIS — Z66 Do not resuscitate: Secondary | ICD-10-CM

## 2015-09-24 DIAGNOSIS — R638 Other symptoms and signs concerning food and fluid intake: Secondary | ICD-10-CM

## 2015-09-24 LAB — CBC
HEMATOCRIT: 33.8 % — AB (ref 36.0–46.0)
Hemoglobin: 11.6 g/dL — ABNORMAL LOW (ref 12.0–15.0)
MCH: 31.8 pg (ref 26.0–34.0)
MCHC: 34.3 g/dL (ref 30.0–36.0)
MCV: 92.6 fL (ref 78.0–100.0)
Platelets: 236 10*3/uL (ref 150–400)
RBC: 3.65 MIL/uL — AB (ref 3.87–5.11)
RDW: 14.7 % (ref 11.5–15.5)
WBC: 6.2 10*3/uL (ref 4.0–10.5)

## 2015-09-24 LAB — PHOSPHORUS: Phosphorus: 2 mg/dL — ABNORMAL LOW (ref 2.5–4.6)

## 2015-09-24 MED ORDER — LORAZEPAM 2 MG/ML IJ SOLN
0.5000 mg | Freq: Once | INTRAMUSCULAR | Status: AC
  Administered 2015-09-24: 0.5 mg via INTRAVENOUS
  Filled 2015-09-24: qty 1

## 2015-09-24 MED ORDER — VITAMINS A & D EX OINT
TOPICAL_OINTMENT | CUTANEOUS | Status: AC
  Administered 2015-09-24: 15:00:00
  Filled 2015-09-24: qty 5

## 2015-09-24 MED ORDER — ORAL CARE MOUTH RINSE
15.0000 mL | Freq: Two times a day (BID) | OROMUCOSAL | Status: DC
  Administered 2015-09-26: 15 mL via OROMUCOSAL
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
## 2015-09-25 DIAGNOSIS — E440 Moderate protein-calorie malnutrition: Secondary | ICD-10-CM

## 2015-09-25 MED ORDER — LORAZEPAM 2 MG/ML IJ SOLN
0.5000 mg | INTRAMUSCULAR | Status: DC | PRN
Start: 2015-09-25 — End: 2015-09-26
  Administered 2015-09-25 – 2015-09-26 (×5): 0.5 mg via INTRAVENOUS
  Filled 2015-09-25 (×5): qty 1
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
## 2015-09-26 DIAGNOSIS — R404 Transient alteration of awareness: Secondary | ICD-10-CM

## 2015-09-26 MED ORDER — MORPHINE SULFATE (CONCENTRATE) 10 MG/0.5ML PO SOLN: 20.0000 mg | ORAL | 0 refills | Status: AC | PRN

## 2015-09-26 MED ORDER — SCOPOLAMINE 1 MG/3DAYS TD PT72: 1.0000 | MEDICATED_PATCH | TRANSDERMAL | 12 refills | Status: AC

## 2015-09-26 MED ORDER — MORPHINE SULFATE (CONCENTRATE) 10 MG/0.5ML PO SOLN
20.0000 mg | ORAL | Status: AC
  Administered 2015-09-26: 20 mg via ORAL

## 2015-09-26 MED ORDER — FENTANYL 25 MCG/HR TD PT72: 25.0000 ug | MEDICATED_PATCH | TRANSDERMAL | 0 refills | Status: AC

## 2015-09-26 MED ORDER — LORAZEPAM 2 MG/ML PO CONC: 1.0000 mg | ORAL | 0 refills | Status: AC | PRN

## 2015-09-26 NOTE — Progress Notes (Signed)
IO:[id number] Rose Sullivan,: Patient is ready is discharge with [facility] awaiting imternal med to discharge.

## 2015-09-26 NOTE — Discharge Summary (Signed)
Physician Discharge Summary  Rose Sullivan [id number] DOB: 09/15/1933 DOA: 09/20/2015  PCP: [name], MD  Admit date: 09/20/2015 Discharge date: 09/26/2015  Time spent: Greater than 30 minutes  Recommendations for Outpatient Follow-up:  1. Home with Hospice 2. Further care as per Hospice Team.   Discharge Diagnoses:  Active Problems:   BREAST CANCER, HX OF   Acute encephalopathy   Paroxysmal atrial fibrillation (HCC)   Legally blind   History of recent stroke   Lethargy   Decreased oral intake   Failure to thrive in adult   Pressure ulcer   Acute deep vein thrombosis (DVT) of femoral vein of right lower extremity (HCC)   Hypokalemia   Cough   DNR (do not resuscitate)   Metastatic breast cancer ([place])   DVT of leg (deep venous thrombosis), right   Hypophosphatemia   UTI  Discharge Condition: Optimized  Diet recommendation: As tolerated  Filed Weights   09/21/15 0253 09/22/15 0500 09/23/15 0500  Weight: 64.2 kg (141 lb 8.6 oz) 68.7 kg (151 lb 7.3 oz) 75.4 kg (166 lb 3.6 oz)    History of present illness: 82 year old female with history of HTN, breast cancer who presented with reported 2-3 days of altered mental status. On the day of presentation, patient was noted to have distended abdomen. CT abdomen revealed right femoral deep venous thrombosis. CT Abdomen also revealed metastatic disease to L4, bilateral iliac bones, intra-abd nodes and 10th rib on right side. Significant previous history included breast cancer diagnosed in 2006, CVA, dysphagia and UTI, and patient was under Hospice care before the care was rescinded.   Hospital Course: Patient was admitted and initially started on heparin product and later change to Elliquis. Patient's daughter requested Oncology and GI consult for possible PEG Tube placement. Patient was seen by the Oncologist, Dr. Jana Hakim. GI saw patient and recommended that patient was not a candidate for PEG Tube placement. Palliative care  team was consulted to define goal of care. Patient's daughter reluctantly accepted Palliative are consult. After extensive discussion with the Palliative care team, it was decided that patient should be discharged back home with Hospice Team follow up. During the hospital stay, patient was also treated for likely UTI using Rocephin.  Patient will be discharged back home with Home Hospice  Procedures:  None  Consultations:  Onocology  GI  Palliative  Hospice  Discharge Exam: Vitals:   09/25/15 1415 09/25/15 2200  BP: (!) 146/80 (!) 148/83  Pulse: 99 (!) 122  Resp: 19 20  Temp:      General: Not in distress. Comfortable Cardiovascular: S1S2 Respiratory: Clear to ausculation  Discharge Instructions   Discharge Instructions    Diet - low sodium heart healthy    Complete by:  As directed   As tolerated   Increase activity slowly    Complete by:  As directed     Current Discharge Medication List    START taking these medications   Details  fentaNYL (DURAGESIC - DOSED MCG/HR) 25 MCG/HR patch Place 1 patch (25 mcg total) onto the skin every other day. Qty: 15 patch, Refills: 0      CONTINUE these medications which have NOT CHANGED   Details  bisacodyl (DULCOLAX) 10 MG suppository Place 1 suppository (10 mg total) rectally daily as needed for moderate constipation. Qty: 12 suppository, Refills: 0    collagenase (SANTYL) ointment Apply 1 application topically daily.    diclofenac sodium (VOLTAREN) 1 % GEL Apply 2 g topically  4 (four) times daily as needed (for leg/knee pain).    HYDROcodone-acetaminophen (HYCET) 7.5-325 mg/15 ml solution Take 15 mLs by mouth 4 (four) times daily as needed for moderate pain.     morphine (ROXANOL) 20 MG/ML concentrated solution Take 10 mg by mouth every 4 (four) hours as needed for severe pain or shortness of breath.    ondansetron (ZOFRAN-ODT) 8 MG disintegrating tablet Take 8 mg by mouth every 8 (eight) hours as needed for nausea  or vomiting.      STOP taking these medications     mupirocin ointment (BACTROBAN) 2 %      senna-docusate (SENOKOT-S) 8.6-50 MG tablet        Allergies  Allergen Reactions  . Trazodone And Nefazodone Other (See Comments)    Reaction:  Hallucinations       The results of significant diagnostics from this hospitalization (including imaging, microbiology, ancillary and laboratory) are listed below for reference.    Significant Diagnostic Studies: Ct Abdomen Pelvis W Contrast  Result Date: 09/20/2015 CLINICAL DATA:  Patient with altered mental status for 3 days. History of dementia. Distended abdomen. EXAM: CT ABDOMEN AND PELVIS WITH CONTRAST TECHNIQUE: Multidetector CT imaging of the abdomen and pelvis was performed using the standard protocol following bolus administration of intravenous contrast. CONTRAST:  131mL ISOVUE-300 IOPAMIDOL (ISOVUE-300) INJECTION 61% COMPARISON:  CT abdomen pelvis 04/02/2007. FINDINGS: Lower chest: Normal heart size. Small bilateral pleural effusions. Subpleural ground-glass opacities within the lower lobes bilaterally. Hepatobiliary: The liver is normal in size and contour. Patient status post cholecystectomy. Intrahepatic and extrahepatic biliary ductal dilatation. Common bile duct measures up to 19 mm. Pancreas: Unremarkable Spleen: Unremarkable Adrenals/Urinary Tract: Normal adrenal glands. Kidneys enhance symmetrically with contrast. No hydronephrosis. Urinary bladder is unremarkable. Small left excreted contrast dependently within the urinary bladder. Stomach/Bowel: Sigmoid colonic diverticulosis. No CT evidence for acute diverticulitis. No abnormal bowel wall thickening or evidence for bowel obstruction. No free fluid or free intraperitoneal air. Vascular/Lymphatic: Peripheral calcified atherosclerotic plaque involving the abdominal aorta. Prominent upper abdominal and porta hepatic lymph nodes measuring up to 16 mm (image 23; series 2). Filling defect  demonstrated within the right common femoral vein (image 69; series 5). Other: Uterus and adnexal structures unremarkable. Small cystic structure within the right adnexa. Musculoskeletal: There is a 1.9 cm sclerotic lesion within the right ilium (image 53; series 2). There is a 0.8 cm sclerotic lesion within the left ilium (image 58; series 2). 1.7 cm sclerotic lesion within the L4 vertebral body. Lower thoracic and lumbar spine degenerative changes. Osseous destruction of the posterior right tenth rib with associated 1.9 cm soft tissue mass (image 15; series 2). 140 cm soft tissue mass overlying the lower right hemi thorax (image 25; series 2). IMPRESSION: Filling defect demonstrated within the right common femoral vein most suggestive of deep venous thrombosis. Marked dilatation of the common bile duct measuring up to 19 mm. While this may potentially be physiologic in the setting of prior cholecystectomy, obstruction not excluded. Recommend laboratory correlation. Multiple prominent mildly enlarged upper abdominal lymph nodes raising the possibility of metastatic adenopathy. Sclerotic osseous metastasis involving the right and left ilium and L4 vertebral body. Osseous metastasis involving the posterior right tenth rib with associated soft tissue mass. Small bilateral pleural effusions. These results were called by telephone at the time of interpretation on 09/20/2015 at 8:28 pm to Dr. Janetta Hora , who verbally acknowledged these results. Electronically Signed   By: Lovey Newcomer M.D.   On: 09/20/2015  20:31   Dg Chest Port 1 View  Result Date: 09/20/2015 CLINICAL DATA:  Cough, abdominal pain and vomiting EXAM: PORTABLE CHEST 1 VIEW COMPARISON:  Chest radiograph 08/11/2015 FINDINGS: Cardiomediastinal contours are unchanged with persistent prominence of the upper mediastinum. There are diffusely increased bilateral lung markings without focal airspace consolidation. No pulmonary edema. No pneumothorax or sizable  pleural effusion. Sclerotic lesions within the proximal humeri bilaterally with good are unchanged. IMPRESSION: No radiographic evidence of pneumonia. Electronically Signed   By: Ulyses Jarred M.D.   On: 09/20/2015 22:19    Microbiology: No results found for this or any previous visit (from the past 240 hour(s)).   Labs: Basic Metabolic Panel:  Recent Labs Lab 09/20/15 1836 09/20/15 2243 09/21/15 0218 09/23/15 1001 09/24/15 0510  NA 139  --  141 135  --   K 2.6*  --  2.9* 5.5*  --   CL 102  --  109 109  --   CO2 28  --  25 20*  --   GLUCOSE 99  --  81 147*  --   BUN 30*  --  22* 13  --   CREATININE 1.00  --  0.78 0.68  --   CALCIUM 9.1  --  8.0* 7.9*  --   MG  --  1.7 1.6*  --   --   PHOS  --   --   --  1.0* 2.0*   Liver Function Tests:  Recent Labs Lab 09/20/15 1836 09/23/15 1001  AST 37  --   ALT 21  --   ALKPHOS 68  --   BILITOT 1.3*  --   PROT 6.7  --   ALBUMIN 3.2* 2.6*    Recent Labs Lab 09/20/15 1836  LIPASE 41   No results for input(s): AMMONIA in the last 168 hours. CBC:  Recent Labs Lab 09/20/15 1836 09/22/15 0555 09/23/15 0534 09/24/15 0510  WBC 8.8 8.3 8.3 6.2  NEUTROABS 5.2  --   --   --   HGB 12.7 10.6* 12.0 11.6*  HCT 38.3 31.8* 36.5 33.8*  MCV 94.8 94.9 95.5 92.6  PLT 253 222 227 236   Cardiac Enzymes: No results for input(s): CKTOTAL, CKMB, CKMBINDEX, TROPONINI in the last 168 hours. BNP: BNP (last 3 results) No results for input(s): BNP in the last 8760 hours.  ProBNP (last 3 results) No results for input(s): PROBNP in the last 8760 hours.  CBG: No results for input(s): GLUCAP in the last 168 hours.     Signed:  Dana Allan, MD  [facility] Pager #: [phone] 7PM-7AM contact night coverage as above

## 2015-09-26 NOTE — Progress Notes (Signed)
  Date:  September 26, 2015 Chart reviewed for concurrent status and case management needs. Will continue to follow the patient for changes and needs: Discharge Planning: Patient going home via [ptar with [facility] [place] Daughter is with the patient at time of transport/ Velva Harman, BSN, [place], [place]

## 2015-09-26 NOTE — Progress Notes (Signed)
   09/26/15 1400  Clinical Encounter Type  Visited With Family  Visit Type Psychological support;Spiritual support  Referral From Chaplain  I visited with Ms. Purdon's daughter, [name], after learning that her mother was still at the hospital, to offer emotional and spiritual support. Angie reported that her mother was scheduled to go home today. She continued to express hope through her faith that her mother will recover but was also grounded and realistic about the likelihood that her mother will die within the next few days. She expressed being in a state of uncertainty, realizing that the death will occur but being frustrated by not knowing exactly when. Angie described her relationship with her mother and the ways her mother has provided emotional and spiritual support to their extended family and close relatives. Angie noted that she has cared for her mother for a long time and expressed concern about what she will do with her life after her mother dies. I offered comfort and said a prayer with Angie that she would find peace and relief from her state of emotional uncertainty.

## 2015-09-27 ENCOUNTER — Telehealth: Payer: Self-pay | Admitting: *Deleted

## 2015-09-27 NOTE — Telephone Encounter (Signed)
Pt was on TCM list admitted for breast cancer who presented with reported 2-3 days of altered mental status. On the day of presentation, patient was noted to have distended abdomen. CT abdomen revealed right femoral deep venous thrombosis. CT Abdomen also revealed metastatic disease to L4, bilateral iliac bones, intra-abd nodes and 10th rib on right side. Significant previous history included breast cancer diagnosed in 2006, CVA, dysphagia and UTI, and patient was under Hospice care before the care was rescinded.. Pt d/c 8/28, and will be discharged back home with Home Hospice....Johny Chess

## 2015-09-28 ENCOUNTER — Telehealth: Payer: Self-pay | Admitting: Internal Medicine

## 2015-09-30 ENCOUNTER — Telehealth: Payer: Self-pay

## 2015-09-30 NOTE — Telephone Encounter (Signed)
Hospice called this morning and state that pt passed this morning at 11:54.  joh

## 2015-09-30 NOTE — Telephone Encounter (Signed)
On 09/30/2015 I received a death certificate from [facility])  (original). The death certificate is for burial. The patient is a patient of Doctor [name]. The death certificate will be taken to [facility] this am for signature.  On 27-Oct-2015 I received the death certificate back from Doctor [name]. I got the death certificate ready and called the funeral home to let them know the death certificate is ready for pickup.

## 2015-09-30 DEATH — deceased

## 2015-10-05 IMAGING — CR DG FINGER THUMB 2+V*L*
3 series · 3 of 3 positions shown · non-contrast
Comparison: None.

CLINICAL DATA: Infection around thumb nail

EXAM:
LEFT THUMB 2+V

[x finger pa left (1 of 2)]
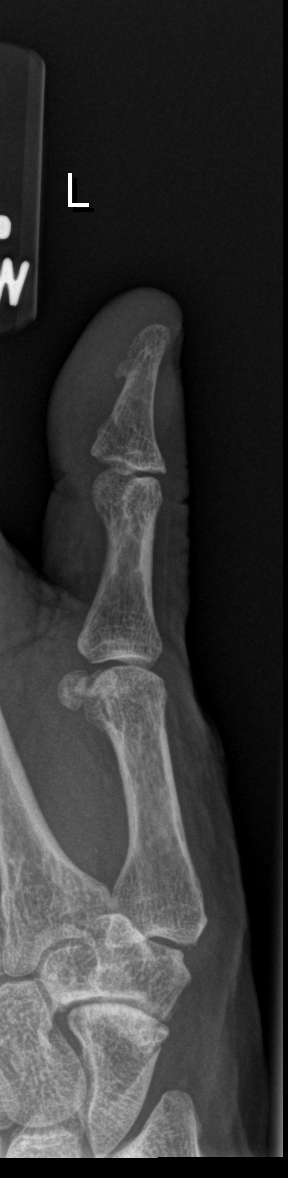

[x finger lat left]
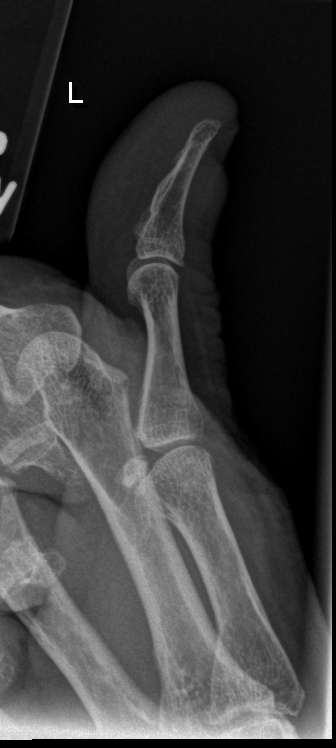

[x finger pa left (2 of 2)]
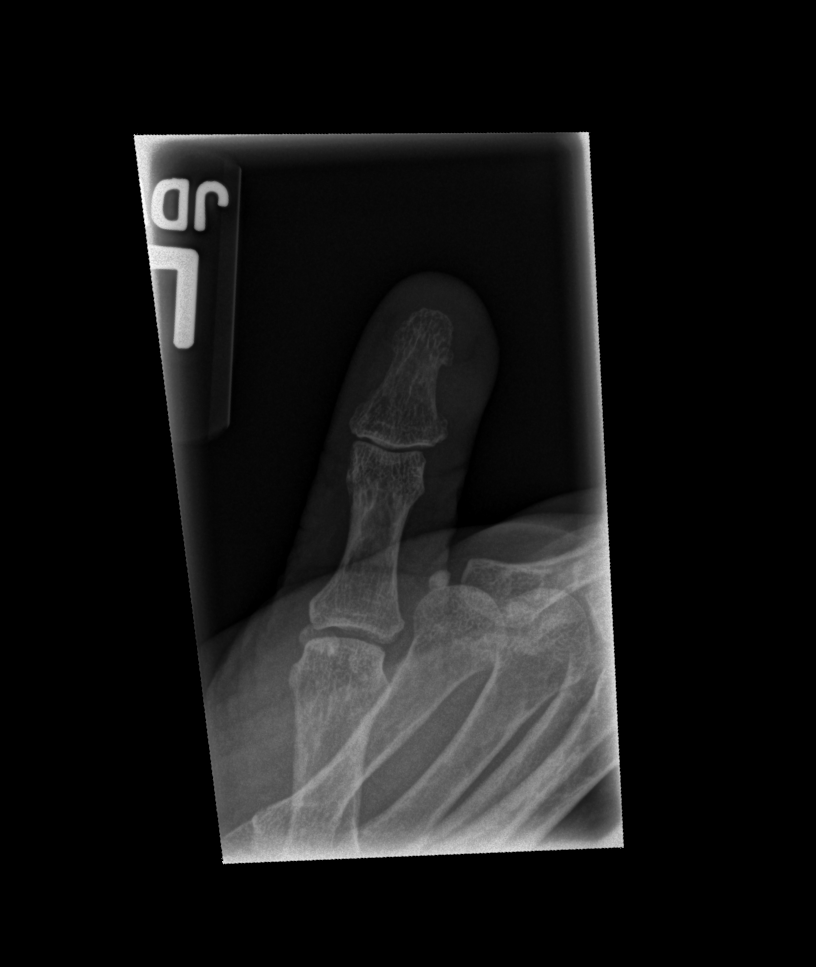

[3 of 3 positions shown; findings below may reference images not displayed]

FINDINGS: There is mild soft tissue swelling around the distal phalanx. The
underlying bony structures appear intact. No focal bone erosion
identified. No fracture or dislocation identified. No radio-opaque
foreign body or soft tissue calcifications identified.
IMPRESSION: 1. Soft tissue swelling.

2. No bony destruction identified.

## 2015-10-10 ENCOUNTER — Ambulatory Visit: Payer: Medicare Other | Admitting: Cardiology

## 2017-04-08 NOTE — Progress Notes (Signed)
This encounter was created in error - please disregard.

## 2018-02-09 IMAGING — CT CT HEAD W/O CM
1 of 2 series · 15 of 30 positions shown, 19 images · non-contrast
Comparison: 12/18/2012

CLINICAL DATA: Altered mental status.

EXAM:
CT HEAD WITHOUT CONTRAST
TECHNIQUE: Contiguous axial images were obtained from the base of the skull
through the vertex without intravenous contrast.

[Series 2: head 2.0 h70h · axial · 0.39mm/px · z∈[-132,+18]mm · 15 of 85 slices shown, 19 images]
[im 5/85  brain]
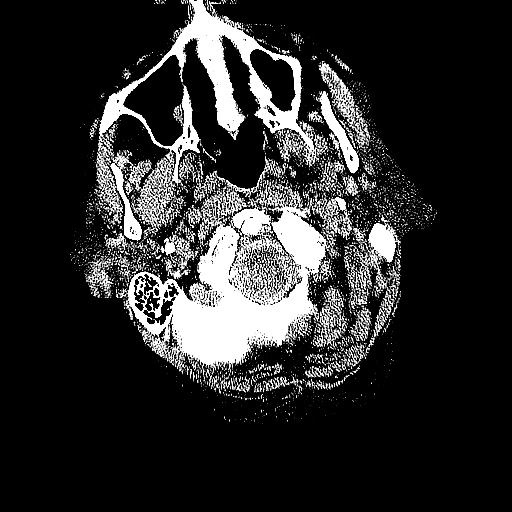
[im 5/85  bone]
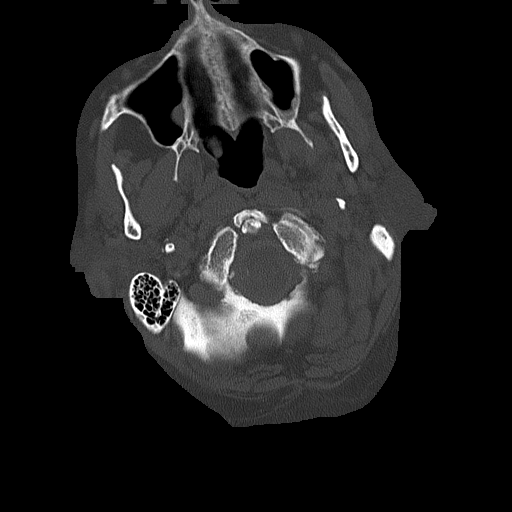
[im 9/85  brain]
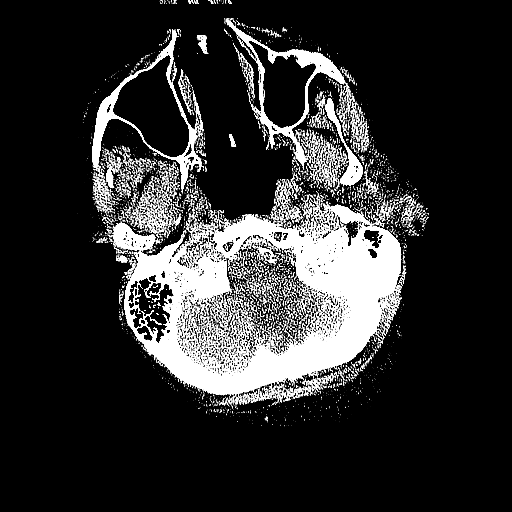
[im 17/85  brain]
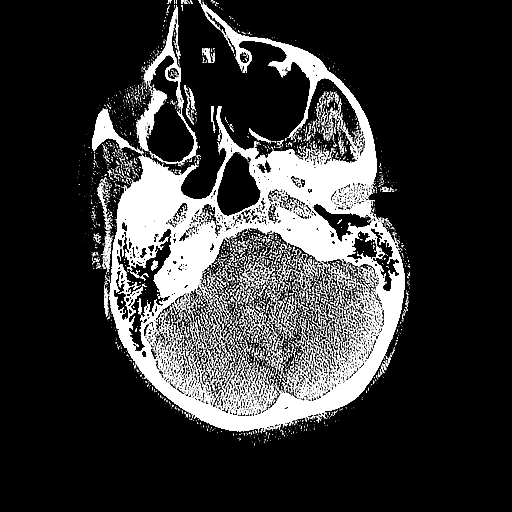
[im 22/85  brain]
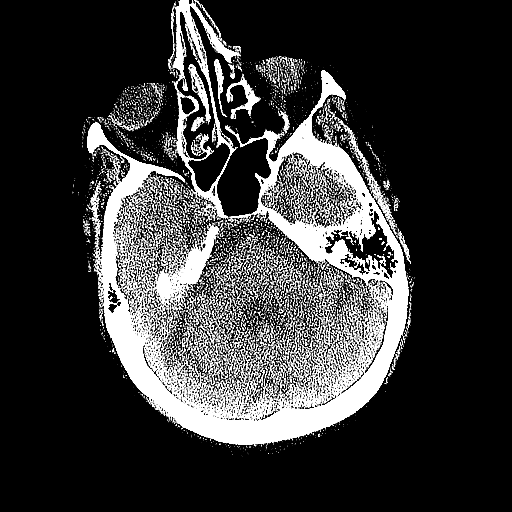
[im 26/85  brain]
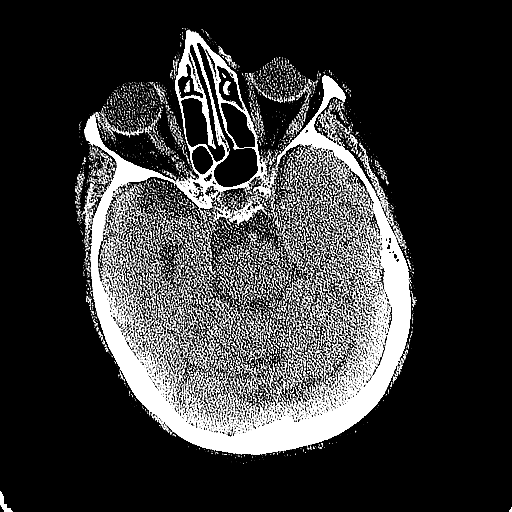
[im 26/85  bone]
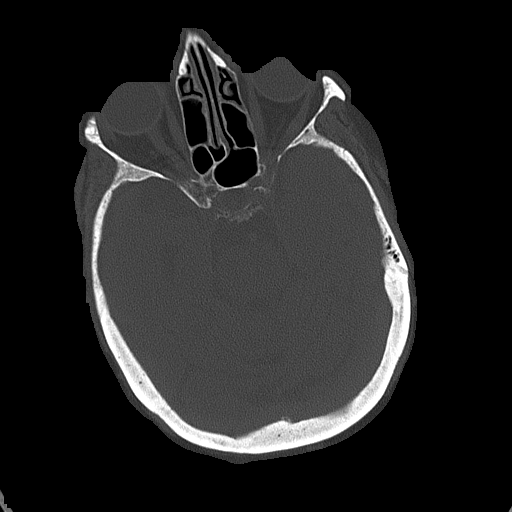
[im 30/85  brain]
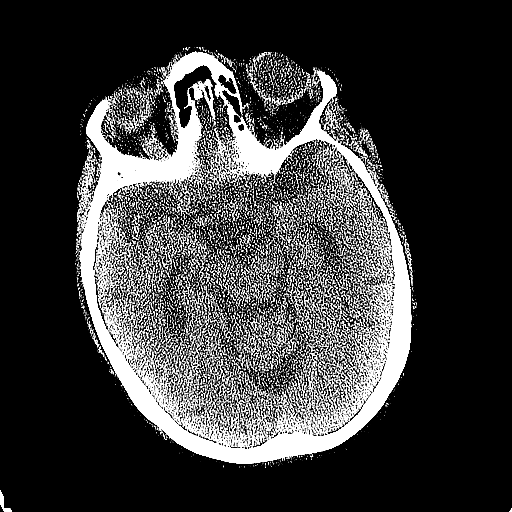
[im 38/85  brain]
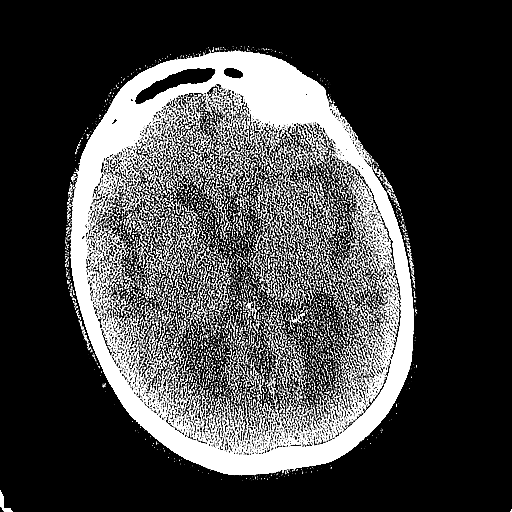
[im 43/85  brain]
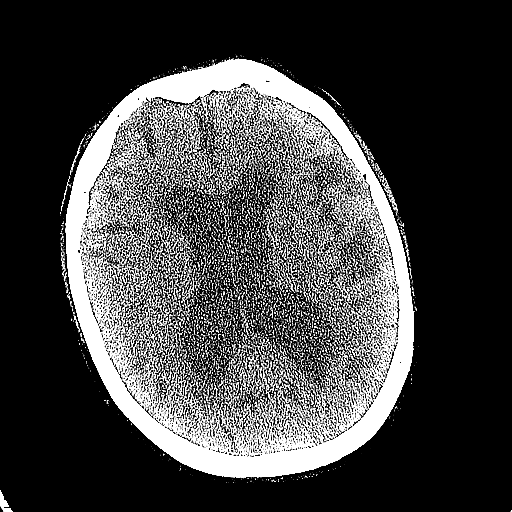
[im 47/85  brain]
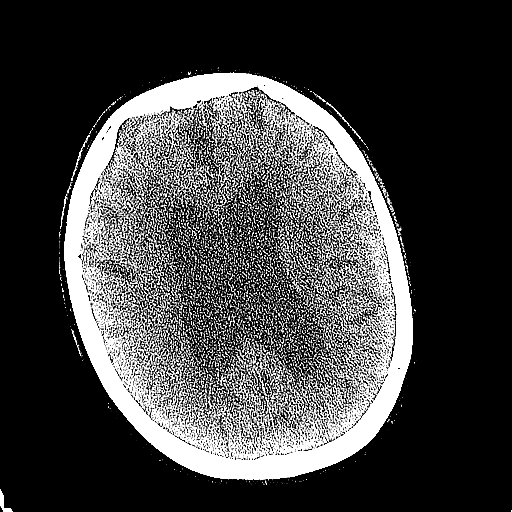
[im 47/85  bone]
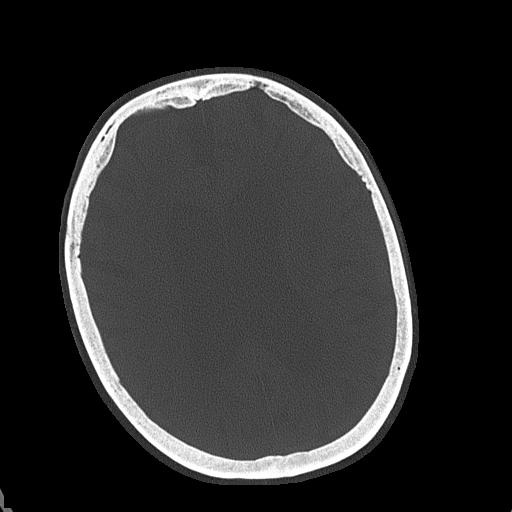
[im 55/85  brain]
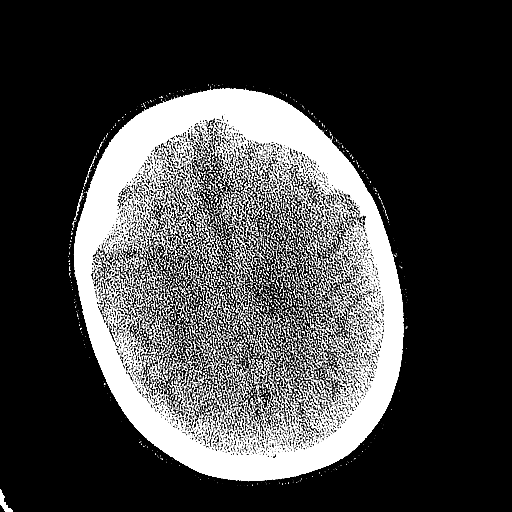
[im 59/85  brain]
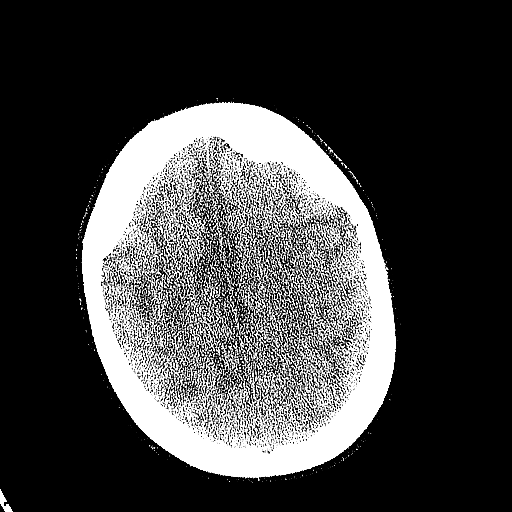
[im 64/85  brain]
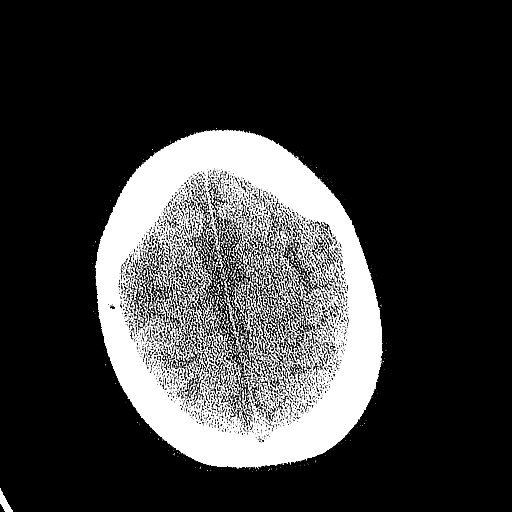
[im 68/85  brain]
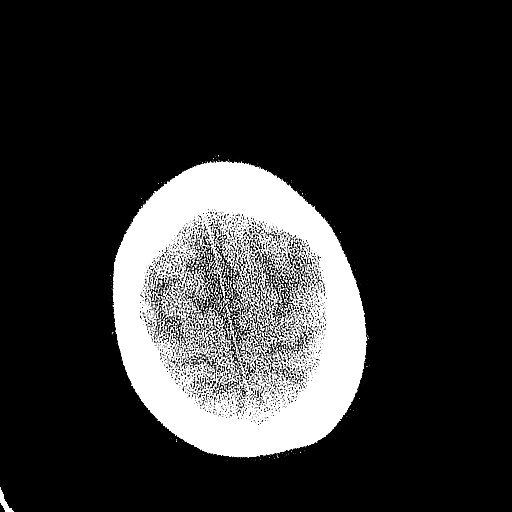
[im 68/85  bone]
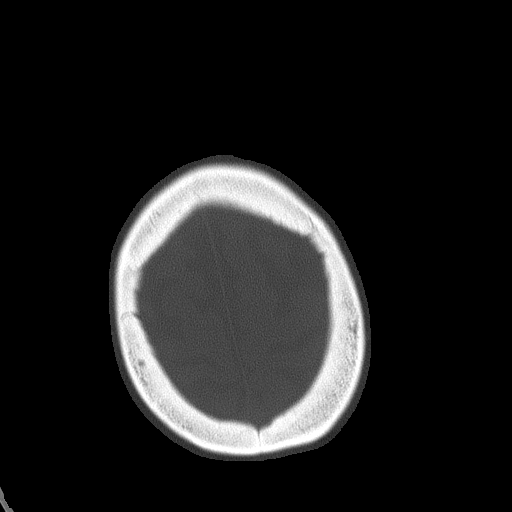
[im 76/85  brain]
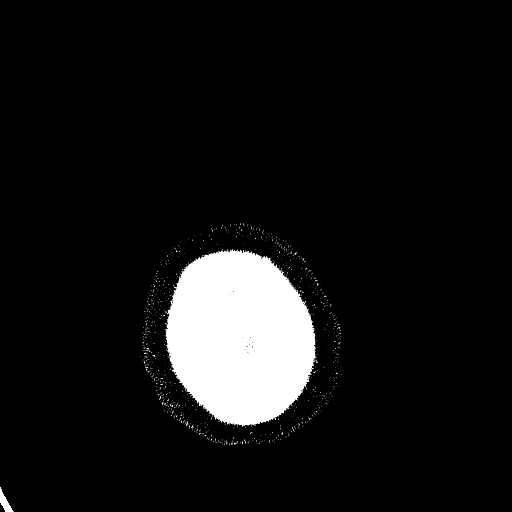
[im 80/85  brain]
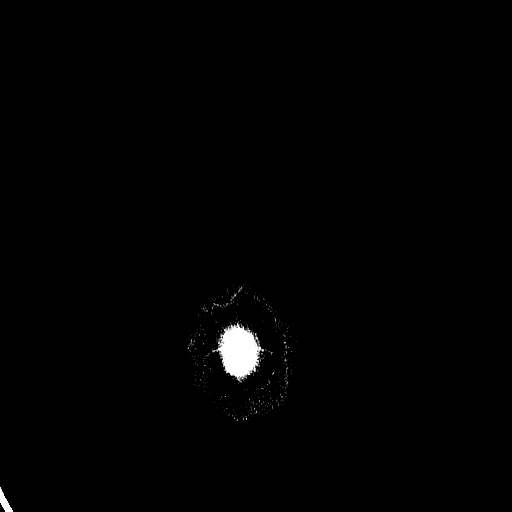

[15 of 30 positions shown; findings below may reference images not displayed]

FINDINGS: Similar findings of advanced atrophy with sulcal prominence
centralized volume loss with commensurate ex vacuo dilatation of the
ventricular system. Extensive nearly confluent periventricular
hypodensities are unchanged compatible microvascular ischemic
disease. Old lacunar infarcts within the left side of the pons
(image 9, series 3) as well as the bilateral insular cortices (image
16). Given extensive background parenchymal abnormalities, there is
no CT evidence of superimposed acute large territory infarct. No
intraparenchymal or extra-axial mass or hemorrhage. Unchanged size
and configuration of the ventricles and basilar cisterns. No midline
shift. Intracranial atherosclerosis. Limited visualization of the
paranasal sinuses and mastoid air cells is normal. No air-fluid
levels. Debris is noted within the bilateral external auditory
canals. Regional soft tissues appear normal. No displaced calvarial
fracture.
IMPRESSION: Similar findings of advanced atrophy and microvascular ischemic
disease without acute intracranial process.
